# Patient Record
Sex: Female | Born: 1940 | Race: White | Hispanic: No | Marital: Married | Smoking: Former smoker
Health system: Southern US, Community
[De-identification: ages and names within clinical notes are randomized; demographics above are authoritative.]

## PROBLEM LIST (undated history)

## (undated) DIAGNOSIS — M810 Age-related osteoporosis without current pathological fracture: Secondary | ICD-10-CM

## (undated) DIAGNOSIS — R011 Cardiac murmur, unspecified: Secondary | ICD-10-CM

## (undated) DIAGNOSIS — K219 Gastro-esophageal reflux disease without esophagitis: Secondary | ICD-10-CM

## (undated) DIAGNOSIS — K589 Irritable bowel syndrome without diarrhea: Secondary | ICD-10-CM

## (undated) DIAGNOSIS — M419 Scoliosis, unspecified: Secondary | ICD-10-CM

## (undated) DIAGNOSIS — H269 Unspecified cataract: Secondary | ICD-10-CM

## (undated) DIAGNOSIS — M797 Fibromyalgia: Secondary | ICD-10-CM

## (undated) DIAGNOSIS — M1990 Unspecified osteoarthritis, unspecified site: Secondary | ICD-10-CM

## (undated) DIAGNOSIS — J449 Chronic obstructive pulmonary disease, unspecified: Secondary | ICD-10-CM

## (undated) DIAGNOSIS — G4730 Sleep apnea, unspecified: Secondary | ICD-10-CM

## (undated) DIAGNOSIS — Z5189 Encounter for other specified aftercare: Secondary | ICD-10-CM

## (undated) HISTORY — PX: TUBAL LIGATION: SHX77

## (undated) HISTORY — DX: Irritable bowel syndrome, unspecified: K58.9

## (undated) HISTORY — DX: Chronic obstructive pulmonary disease, unspecified: J44.9

## (undated) HISTORY — DX: Sleep apnea, unspecified: G47.30

## (undated) HISTORY — PX: APPENDECTOMY: SHX54

## (undated) HISTORY — DX: Gastro-esophageal reflux disease without esophagitis: K21.9

## (undated) HISTORY — DX: Scoliosis, unspecified: M41.9

## (undated) HISTORY — PX: CORONARY ARTERY BYPASS GRAFT: SHX141

## (undated) HISTORY — PX: ABDOMINAL HYSTERECTOMY: SHX81

## (undated) HISTORY — DX: Unspecified osteoarthritis, unspecified site: M19.90

## (undated) HISTORY — DX: Fibromyalgia: M79.7

## (undated) HISTORY — DX: Unspecified cataract: H26.9

## (undated) HISTORY — DX: Cardiac murmur, unspecified: R01.1

## (undated) HISTORY — DX: Age-related osteoporosis without current pathological fracture: M81.0

## (undated) HISTORY — PX: TONSILLECTOMY: SUR1361

## (undated) HISTORY — DX: Encounter for other specified aftercare: Z51.89

---

## 1997-07-04 ENCOUNTER — Other Ambulatory Visit: Admission: RE | Admit: 1997-07-04 | Discharge: 1997-07-04 | Payer: Self-pay | Admitting: *Deleted

## 1998-09-22 ENCOUNTER — Other Ambulatory Visit: Admission: RE | Admit: 1998-09-22 | Discharge: 1998-09-22 | Payer: Self-pay | Admitting: *Deleted

## 1999-09-22 ENCOUNTER — Encounter: Admission: RE | Admit: 1999-09-22 | Discharge: 1999-09-22 | Payer: Self-pay | Admitting: Internal Medicine

## 1999-09-22 ENCOUNTER — Encounter: Payer: Self-pay | Admitting: Internal Medicine

## 1999-09-29 ENCOUNTER — Other Ambulatory Visit: Admission: RE | Admit: 1999-09-29 | Discharge: 1999-09-29 | Payer: Self-pay | Admitting: *Deleted

## 2000-01-04 ENCOUNTER — Encounter: Payer: Self-pay | Admitting: Cardiovascular Disease

## 2000-01-05 ENCOUNTER — Inpatient Hospital Stay (HOSPITAL_COMMUNITY): Admission: RE | Admit: 2000-01-05 | Discharge: 2000-01-06 | Payer: Self-pay | Admitting: Cardiovascular Disease

## 2000-02-27 ENCOUNTER — Encounter: Payer: Self-pay | Admitting: *Deleted

## 2000-02-27 ENCOUNTER — Inpatient Hospital Stay (HOSPITAL_COMMUNITY): Admission: EM | Admit: 2000-02-27 | Discharge: 2000-03-06 | Payer: Self-pay | Admitting: Emergency Medicine

## 2000-03-02 ENCOUNTER — Encounter: Payer: Self-pay | Admitting: Cardiothoracic Surgery

## 2000-03-02 ENCOUNTER — Encounter: Payer: Self-pay | Admitting: Thoracic Surgery (Cardiothoracic Vascular Surgery)

## 2000-03-03 ENCOUNTER — Encounter: Payer: Self-pay | Admitting: Thoracic Surgery (Cardiothoracic Vascular Surgery)

## 2000-03-04 ENCOUNTER — Encounter: Payer: Self-pay | Admitting: Thoracic Surgery (Cardiothoracic Vascular Surgery)

## 2000-09-23 ENCOUNTER — Encounter: Payer: Self-pay | Admitting: *Deleted

## 2000-09-23 ENCOUNTER — Encounter: Admission: RE | Admit: 2000-09-23 | Discharge: 2000-09-23 | Payer: Self-pay | Admitting: *Deleted

## 2000-09-29 ENCOUNTER — Other Ambulatory Visit: Admission: RE | Admit: 2000-09-29 | Discharge: 2000-09-29 | Payer: Self-pay | Admitting: *Deleted

## 2000-10-21 ENCOUNTER — Encounter: Payer: Self-pay | Admitting: *Deleted

## 2000-10-21 ENCOUNTER — Encounter: Admission: RE | Admit: 2000-10-21 | Discharge: 2000-10-21 | Payer: Self-pay | Admitting: *Deleted

## 2001-07-10 ENCOUNTER — Encounter: Payer: Self-pay | Admitting: Thoracic Surgery (Cardiothoracic Vascular Surgery)

## 2001-07-10 ENCOUNTER — Encounter
Admission: RE | Admit: 2001-07-10 | Discharge: 2001-07-10 | Payer: Self-pay | Admitting: Thoracic Surgery (Cardiothoracic Vascular Surgery)

## 2001-09-25 ENCOUNTER — Encounter: Payer: Self-pay | Admitting: *Deleted

## 2001-09-25 ENCOUNTER — Encounter: Admission: RE | Admit: 2001-09-25 | Discharge: 2001-09-25 | Payer: Self-pay | Admitting: *Deleted

## 2001-10-04 ENCOUNTER — Other Ambulatory Visit: Admission: RE | Admit: 2001-10-04 | Discharge: 2001-10-04 | Payer: Self-pay | Admitting: *Deleted

## 2002-10-09 ENCOUNTER — Other Ambulatory Visit: Admission: RE | Admit: 2002-10-09 | Discharge: 2002-10-09 | Payer: Self-pay | Admitting: *Deleted

## 2002-10-11 ENCOUNTER — Encounter: Payer: Self-pay | Admitting: *Deleted

## 2002-10-11 ENCOUNTER — Encounter: Admission: RE | Admit: 2002-10-11 | Discharge: 2002-10-11 | Payer: Self-pay | Admitting: *Deleted

## 2002-11-27 ENCOUNTER — Encounter: Payer: Self-pay | Admitting: *Deleted

## 2002-11-27 ENCOUNTER — Encounter: Admission: RE | Admit: 2002-11-27 | Discharge: 2002-11-27 | Payer: Self-pay | Admitting: *Deleted

## 2003-08-21 ENCOUNTER — Ambulatory Visit (HOSPITAL_COMMUNITY): Admission: RE | Admit: 2003-08-21 | Discharge: 2003-08-21 | Payer: Self-pay | Admitting: Internal Medicine

## 2003-08-22 ENCOUNTER — Ambulatory Visit (HOSPITAL_COMMUNITY): Admission: RE | Admit: 2003-08-22 | Discharge: 2003-08-22 | Payer: Self-pay | Admitting: Internal Medicine

## 2003-10-29 ENCOUNTER — Encounter: Admission: RE | Admit: 2003-10-29 | Discharge: 2003-10-29 | Payer: Self-pay | Admitting: Internal Medicine

## 2004-12-02 ENCOUNTER — Encounter: Admission: RE | Admit: 2004-12-02 | Discharge: 2004-12-02 | Payer: Self-pay | Admitting: Internal Medicine

## 2004-12-08 ENCOUNTER — Other Ambulatory Visit: Admission: RE | Admit: 2004-12-08 | Discharge: 2004-12-08 | Payer: Self-pay | Admitting: Obstetrics & Gynecology

## 2005-06-07 ENCOUNTER — Ambulatory Visit (HOSPITAL_COMMUNITY): Admission: RE | Admit: 2005-06-07 | Discharge: 2005-06-07 | Payer: Self-pay | Admitting: Internal Medicine

## 2005-12-06 ENCOUNTER — Ambulatory Visit (HOSPITAL_COMMUNITY): Admission: RE | Admit: 2005-12-06 | Discharge: 2005-12-06 | Payer: Self-pay | Admitting: Internal Medicine

## 2006-09-14 ENCOUNTER — Ambulatory Visit (HOSPITAL_COMMUNITY): Admission: RE | Admit: 2006-09-14 | Discharge: 2006-09-14 | Payer: Self-pay | Admitting: Cardiovascular Disease

## 2008-03-01 LAB — HM PAP SMEAR

## 2009-08-27 ENCOUNTER — Inpatient Hospital Stay (HOSPITAL_COMMUNITY): Admission: EM | Admit: 2009-08-27 | Discharge: 2009-08-28 | Payer: Self-pay | Admitting: Internal Medicine

## 2009-08-27 ENCOUNTER — Encounter: Payer: Self-pay | Admitting: Emergency Medicine

## 2009-08-27 ENCOUNTER — Encounter: Payer: Self-pay | Admitting: Pulmonary Disease

## 2009-08-27 ENCOUNTER — Ambulatory Visit: Payer: Self-pay | Admitting: Diagnostic Radiology

## 2009-09-04 ENCOUNTER — Ambulatory Visit (HOSPITAL_COMMUNITY): Admission: RE | Admit: 2009-09-04 | Discharge: 2009-09-04 | Payer: Self-pay | Admitting: Internal Medicine

## 2010-01-28 DIAGNOSIS — I7300 Raynaud's syndrome without gangrene: Secondary | ICD-10-CM | POA: Insufficient documentation

## 2010-01-28 DIAGNOSIS — I10 Essential (primary) hypertension: Secondary | ICD-10-CM | POA: Insufficient documentation

## 2010-01-29 ENCOUNTER — Ambulatory Visit: Payer: Self-pay | Admitting: Pulmonary Disease

## 2010-01-29 ENCOUNTER — Encounter: Payer: Self-pay | Admitting: Pulmonary Disease

## 2010-01-29 HISTORY — PX: CORONARY ANGIOPLASTY: SHX604

## 2010-02-11 DIAGNOSIS — J449 Chronic obstructive pulmonary disease, unspecified: Secondary | ICD-10-CM | POA: Insufficient documentation

## 2010-04-02 NOTE — Assessment & Plan Note (Signed)
Summary: consult for possible copd   Visit Type:  Initial Consult Copy to:  [name] MD Primary Provider/Referring Provider:  [name] MD  CC:  pulmonary consult. pt's here to be evaluated for COPD. Jasmine Reeves  History of Present Illness: The pt is a 70y/o female who I have been asked to see for possible "copd".  She recently was seen in the ER for chest pain, and a cxr revealed changes of "copd".  The pt does have a remote history of smoking, and denies having childhood asthma.  She denies doe with her ADL's, and tells me that she can walk without getting sob.  She has no difficulties walking up stairs.  She denies productive cough.  She does have an abnormal sensation/fullness in her chest that is always there, but feels it is due to her GERD.  She has no FHx of emphysema.    Current Medications (verified): 1)  Losartan Potassium 25 Mg Tabs (Losartan Potassium) .... Once Daily 2)  Diltiazem Hcl Er Beads 180 Mg Xr24h-Cap (Diltiazem Hcl Er Beads) .... Once Daily 3)  Labetalol Hcl 100 Mg Tabs (Labetalol Hcl) .Jasmine Reeves.. 100 Mg in The Am and 50 Mg in The Pm 4)  Lansoprazole 30 Mg Cpdr (Lansoprazole) .... Once Daily 5)  Vytorin 10-20 Mg Tabs (Ezetimibe-Simvastatin) .... Once Daily 6)  Cetirizine Hcl 10 Mg Tabs (Cetirizine Hcl) .... Once Daily 7)  Aspirin 81 Mg Tabs (Aspirin) .... Once Daily 8)  Multivitamins  Caps (Multiple Vitamin) .... Once Daily 9)  Iron Supplement 325 (65 Fe) Mg Tabs (Ferrous Sulfate) .... Once Daily 10)  Vitamin D 2000 Unit Tabs (Cholecalciferol) .... Once Daily 11)  Calcium/magnesium 100-400 .... 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 clotting disorder--father father--MI allergies: brother  Social History: Reviewed history from 01/28/2010 and no changes required. married Patient states former smoker. quit 1970. 2-3 cigs a day. 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  The patient denies shortness of breath at rest, coughing up blood, irregular heartbeats, indigestion, loss of appetite, weight change, abdominal pain, sore throat, tooth/dental problems, headaches, ear ache, depression, rash, change in color of mucus, and fever.    Vital Signs:  Patient profile:   70 year old female Weight:      135.25 pounds O2 Sat:      99 % on Room air Temp:     97.7 degrees F oral Pulse rate:   60 / minute BP sitting:   124 / 78  (left arm) Cuff size:   large  Vitals Entered By: Carver Fila (January 29, 2010 2:37 PM)  O2 Flow:  Room air CC: pulmonary consult. pt's here to be evaluated for COPD.  Comments meds and allergies updated Phone number updated Carver Fila  January 29, 2010 2:37 PM    Physical Exam  General:  wd female in nad Eyes:  PERRLA and EOMI.   Nose:  patent  without discharge Mouth:  clear, no exudates or lesions seen Neck:  no jvd ,tmg, LN Lungs:  totally clear to auscultation Heart:  rrr, no mrg Abdomen:  soft and nontender, bs+ Extremities:  no edema or cyanosis, pulses intact distally Neurologic:  alert and oriented, moves all 4.   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  She has a very remote history of smoking, and has an excellent exercise tolerance with no symptoms suggestive of airways disease.  I have told the patient not to worrry about this minimal level of abnormality.  There is really no indication for bronchodilators.  She is to followup with me if she becomes more symptomatic.    Medications Added to Medication List This Visit: 1)  Losartan Potassium 25 Mg Tabs (Losartan potassium) .... Once daily 2)  Diltiazem Hcl Er Beads 180 Mg Xr24h-cap (Diltiazem hcl er beads) .... Once daily 3)  Labetalol Hcl 100 Mg Tabs (Labetalol hcl) .Jasmine Reeves.. 100 mg in the am and 50 mg in the pm 4)  Lansoprazole 30 Mg Cpdr (Lansoprazole) .... Once daily 5)  Vytorin 10-20 Mg Tabs (Ezetimibe-simvastatin) .... Once daily 6)  Cetirizine Hcl 10 Mg Tabs (Cetirizine hcl) .... Once daily 7)  Aspirin 81 Mg Tabs (Aspirin) .... Once daily 8)  Multivitamins Caps (Multiple vitamin) .... Once daily 9)  Iron Supplement 325 (65 Fe) Mg Tabs (Ferrous sulfate) .... Once daily 10)  Vitamin D 2000 Unit Tabs (Cholecalciferol) .... Once daily 11)  Calcium/magnesium 100-400  .... Once daily  Other Orders: Consultation Level IV (96295) Spirometry w/Graph (94010)  Patient Instructions: 1)  stay as active as possible 2)  no indication for intervention at this time. 3)  followup with me if you become more symptomatic.

## 2010-05-17 LAB — BASIC METABOLIC PANEL
BUN: 18 mg/dL (ref 6–23)
CO2: 31 mEq/L (ref 19–32)
Calcium: 9.2 mg/dL (ref 8.4–10.5)
Chloride: 104 mEq/L (ref 96–112)
Creatinine, Ser: 0.7 mg/dL (ref 0.4–1.2)
GFR calc Af Amer: >60 mL/min (ref >60)
GFR calc non Af Amer: >60 mL/min (ref >60)
Glucose, Bld: 113 mg/dL — ABNORMAL HIGH (ref 70–99)
Potassium: 3.8 mEq/L (ref 3.5–5.1)
Sodium: 142 mEq/L (ref 135–145)

## 2010-05-17 LAB — DIFFERENTIAL
Basophils Absolute: 0.1 10*3/uL (ref 0.0–0.1)
Basophils Relative: 3 % — ABNORMAL HIGH (ref 0–1)
Eosinophils Absolute: 0.2 10*3/uL (ref 0.0–0.7)
Eosinophils Relative: 4 % (ref 0–5)
Lymphocytes Relative: 24 % (ref 12–46)
Lymphs Abs: 1.2 10*3/uL (ref 0.7–4.0)
Monocytes Absolute: 0.4 10*3/uL (ref 0.1–1.0)
Monocytes Relative: 9 % (ref 3–12)
Neutro Abs: 2.9 10*3/uL (ref 1.7–7.7)
Neutrophils Relative %: 60 % (ref 43–77)

## 2010-05-17 LAB — CBC
HCT: 36.2 % (ref 36.0–46.0)
Hemoglobin: 12.6 g/dL (ref 12.0–15.0)
MCH: 31.4 pg (ref 26.0–34.0)
MCHC: 34.7 g/dL (ref 30.0–36.0)
MCV: 90.4 fL (ref 78.0–100.0)
Platelets: 188 10*3/uL (ref 150–400)
RBC: 4.01 MIL/uL (ref 3.87–5.11)
RDW: 12.3 % (ref 11.5–15.5)
WBC: 4.8 10*3/uL (ref 4.0–10.5)

## 2010-05-17 LAB — CK TOTAL AND CKMB (NOT AT ARMC)
CK, MB: 1.8 ng/mL (ref 0.3–4.0)
CK, MB: 2.2 ng/mL (ref 0.3–4.0)
Relative Index: INVALID (ref 0.0–2.5)
Relative Index: INVALID (ref 0.0–2.5)
Total CK: 90 U/L (ref 7–177)
Total CK: 98 U/L (ref 7–177)

## 2010-05-17 LAB — LIPASE, BLOOD: Lipase: 132 U/L (ref 23–300)

## 2010-05-17 LAB — LIPID PANEL
Cholesterol: 127 mg/dL (ref 0–200)
HDL: 57 mg/dL (ref >39)
LDL Cholesterol: 60 mg/dL (ref 0–99)
Total CHOL/HDL Ratio: 2.2 RATIO
Triglycerides: 52 mg/dL (ref <150)
VLDL: 10 mg/dL (ref 0–40)

## 2010-05-17 LAB — TROPONIN I
Troponin I: 0.01 ng/mL (ref 0.00–0.06)
Troponin I: 0.02 ng/mL (ref 0.00–0.06)

## 2010-05-17 LAB — TSH: TSH: 2.622 u[IU]/mL (ref 0.350–4.500)

## 2010-05-17 LAB — HEPATIC FUNCTION PANEL
ALT: 29 U/L (ref 0–35)
AST: 35 U/L (ref 0–37)
Albumin: 4.1 g/dL (ref 3.5–5.2)
Alkaline Phosphatase: 59 U/L (ref 39–117)
Bilirubin, Direct: 0 mg/dL (ref 0.0–0.3)
Indirect Bilirubin: 0.7 mg/dL (ref 0.3–0.9)
Total Bilirubin: 0.7 mg/dL (ref 0.3–1.2)
Total Protein: 6.9 g/dL (ref 6.0–8.3)

## 2010-05-17 LAB — POCT CARDIAC MARKERS
CKMB, poc: <1 ng/mL — ABNORMAL LOW (ref 1.0–8.0)
Myoglobin, poc: 56.7 ng/mL (ref 12–200)
Troponin i, poc: <0.05 ng/mL (ref 0.00–0.09)

## 2010-05-17 LAB — MRSA PCR SCREENING: MRSA by PCR: NEGATIVE

## 2010-07-14 NOTE — Cardiovascular Report (Signed)
Jasmine Reeves, Jasmine Reeves                ACCOUNT NO.:  [id number]   MEDICAL RECORD NO.:  [id number]          PATIENT TYPE:  OIB   LOCATION:  2857                         FACILITY:  [facility]   PHYSICIAN:  Nicki Guadalajara, M.D.     DATE OF BIRTH:  [birth year]   DATE OF PROCEDURE:  DATE OF DISCHARGE:                            CARDIAC CATHETERIZATION   INDICATIONS:  Ms. Militza Reeves is very pleasant 70 year old female who  has known coronary artery disease and underwent CABG surgery in January  2002 times 4 by Dr. Cornelius Reeves for severe two-vessel CAD involving LAD and  circumflex vessel.  She underwent LIMA to the distal LAD, saphenous vein  graft to the second diagonal branch, and sequential graft to two  marginal vessels arising from the circumflex coronary artery.  She does  have a history of mild mitral valve prolapse and history of Raynaud's  disease.  Subsequently, she has done remarkably well.   Yesterday, she experienced chest pressure that was significant.  She  apparently had contacted our office and was evaluated by Jasmine Reeves.  In light of the patient's worrisome symptoms, definitive catheterization  was recommended.   PROCEDURE:  After premedication with Versed 2 mg intravenous, the  patient was prepped and draped in the  usual fashion.  The right femoral  artery was punctured anteriorly and a 5-French sheath was inserted.  Diagnostic catheterization was done utilizing 5-French Judkins #4 left  and right coronary catheters.  The right catheter was used for selective  angiography into the saphenous vein grafts.  A 5-French LIMA catheter  was used for selective angiography in the left internal mammary artery.  Pigtail catheter was used for biplane __________.  Distal aortography  was also performed.  Hemostasis obtained by direct manual pressure.   Hemodynamic data:  Central aortic pressure was 136/62.  Left ventricular  pressure is 138/14.   Angiographic data:  Left main coronary was  angiographically normal,  bifurcated into an LAD and left circumflex system.   There was diffuse LAD disease of at least 70% after a large septal  perforating artery.   The circumflex vessel ostium was improved from the initial cath with  minimal narrowing of 10-20%.  There was diffuse 80% stenoses in the AV  groove circumflex.   The right coronary artery was unbypassed and large-caliber dominant  vessel and was angiographically normal.   The vein graft supplying the diagonal vessel was widely patent.  The  diagonal vessel itself was small caliber.  There was also filling back  into the LAD from the SVG injection.   The sequential graft supplying two marginal vessels was widely patent.   The LIMA graft was widely patent and anastomosed to the mid LAD.  There  was no distal disease.   Biplane __________revealed preserved global contractility without  definitive wall motion abnormality.  There was mild angiographic mitral  valve prolapse.   Distal aortography revealed mild tortuosity of the infrarenal aorta  without aneurysmal dilatation and with smooth contour.  There is no  evidence for renal artery stenosis.   IMPRESSION:  1. Normal LV  function.  2. Native two-vessel coronary artery disease involving the LAD with      diffuse 70% stenoses after large septal perforating artery, diffuse      80% AV groove circumflex stenoses.  3.  Patent LIMA to the mid      distal LAD.  3. Patent vein graft to the second diagonal vessel of the LAD.  4. Patent sequential vein graft to two obtuse marginal branches of the      circumflex coronary artery.  5. Normal dominant native right coronary artery.   RECOMMENDATIONS:  Medical therapy.  Most likely the patient's chest pain  was noncardiac in etiology.  No further non cardiac causes will be  evaluated.           ______________________________  Nicki Guadalajara, M.D.     TK/MEDQ  D:  09/14/2006  T:  09/15/2006  Job:  [id number]   cc:    Jasmine Massed, MD  Jasmine Reeves, M.D.

## 2010-07-17 NOTE — Discharge Summary (Signed)
[place]. [facility]  Patient:    JAELYNN, POZO                         MRN: [id number] Adm. Date:  [id number] Disc. Date: 03/06/00 Attending:  Tressie Stalker Dictator:   Loura Pardon, P.A. CC:         Lennette Bihari, M.D.  Marinus Maw, M.D.   Discharge Summary  DATE OF BIRTH:  03/25/1940  CARDIOLOGIST:  Nicki Guadalajara, M.D.  PRIMARY CAREGIVER:  [name], M.D.  FINAL DIAGNOSES:  1. Unstable angina.  2. Progressive two-vessel atherosclerotic coronary obstructive disease with     restenosis of tandem left anterior descending sites angioplastied,     January 04, 2000.  3. Acute blood loss anemia requiring transfusion of three units of packed red     blood cells the day of surgery, two units of fresh frozen plasma the day     of surgery, one-eighth pack of platelets the day of surgery.  SECONDARY DIAGNOSES:  1. Known history of coronary artery disease status post angioplasty of tandem     lesions in the left anterior descending, January 04, 2000.  2. Hypercholesterolemia.  3. Hypertension.  4. Family history of coronary artery disease.  5. History of Raynauds phenomenon improved on Norvasc.  6. History of reflux disease.  7. 20% left renal artery stenosis noted on left heart catheterization,     November 2001.  8. 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  PROCEDURES:  1. Left heart catheterization, February 29, 2000, Dr. Nicki Guadalajara.  The     study demonstrated tandem 90 and 95% stenoses in the left anterior     descending coronary artery extending to the large first septal perforator.     The first diagonal had an ostial 70 to 80% lesion.  The left circumflex     had a 70% mid atrioventricular groove lesion with a 40% ostial lesion.     The circumflex marginal had a 40% ostial lesion.  Mitral valve prolapse     was noted and normal left ventricular function noted.  2. Carotid duplex ultrasound, February 29, 2000.   The study showed no     hemodynamically significant internal carotid artery stenoses.  There were     palpable pulses bilaterally.  3. Coronary artery bypass graft surgery x 4, March 02, 2000, Dr. Tressie Stalker.  In this surgical procedure, the left internal mammary artery was     connected in an end-to-side fashion to the left anterior descending     coronary artery.  A reverse sequential saphenous vein graft was fashioned     from the aorta to the first obtuse marginal and to the second obtuse     marginal and a reverse saphenous vein graft was fashioned from the aorta     to the diagonal.  Patient tolerated the procedure well, was transfused as     mentioned above intraoperatively.  DISCHARGE DISPOSITION:  Ms. Klara Stjames is ready for discharge on March 06, 2000.  Her postoperative course has taken four days.  She has not experienced any respiratory compromise in the postoperative period; and, in fact, after extubation on postoperative day #1, she was achieving 99 to 100% oxygen saturation on room air.  Her postoperative cardiac index was 2.5. Postoperative hemoglobin after transfusion was 9, hematocrit 25%.  She experienced no prolonged cardiac arrhythmias except for an  episode of nonsustained ventricular tachycardia on postoperative day #1.  She was successfully restarted on her preoperative dose of beta blocker and preoperative dose of ACE I inhibitor.  Her incisions are healing nicely.  She has no evidence of erythema, drainage, or swelling.  Her pain is controlled with oral analgesia.  She is taking Darvocet for pain.  She is ambulating independently.  Her mental status has been clear in the postoperative period. She is taking oral nourishment well and has full GI tract function.  DISCHARGE MEDICATIONS:  1. Darvocet-N 100 one to two tablets p.o. q.3-4h. p.r.n. pain.  2. Protonix 40 mg daily.  3. Estrace 2 mg daily.  4. Enteric aspirin 325 mg daily.  5. Toprol XL 50 mg  daily.  6. Altace 2.5 mg daily.  7. Zocor 20 mg daily.  8. Niferex 150 mg daily.  9. Xanax as prescribed for anxiety. 10. Zyrtec, take as prescribed for nasal congestion.  DISCHARGE ACTIVITY:  Ambulation as tolerated.  She is asked not to lift more than 10 pounds nor to drive for the next six weeks.  DISCHARGE DIET:  Low-sodium, low-cholesterol diet.  WOUND CARE:  She may shower daily, keeping her incisions clean and dry.  FOLLOW-UP:  She will see Dr. Tresa Endo in two weeks.  She is to call [phone] to arrange an appointment.  A chest x-ray will be taken.  She will also see Dr. Tressie Stalker three weeks after her discharge bringing her chest x-ray from Dr. Ellin Goodie office.  Dr. Barry Dienes office will call to make that arrangement.  In regards to medications, if she is bothered by continuation of her Raynauds phenomenon, it is very possible that she will be placed back on her Norvasc which she was taking prior to surgery at a dose of 5 mg daily.  HISTORY OF PRESENT ILLNESS:  Julane Crock is a 70 year old female.  She was admitted to [facility] on February 27, 2000, after experiencing increasing frequency and severity of chest tightness and discomfort beginning with February 22, 2000.  At first, she noted pain on exertion only.  However, episodes have become more frequent and severe requiring less and less exertion to initiate them.  On the day of her admission, December 29, she did take nitroglycerin with relief of pain.  She has a known history of atherosclerotic coronary artery disease and she had a positive stress test in the fall of this year and ultimately underwent left heart catheterization, November 2001, demonstrating high-grade proximal left anterior descending coronary artery tandem lesions.  These were treated with percutaneous transluminal coronary angioplasty with satisfactory results.  She is now admitted to [facility] for further workup  and  possible repeat left heart catheterization.  HOSPITAL COURSE:  After admission to [facility] on December 29 with accelerating angina, cardiac enzymes on admission were negative.  She was place don IV heparin and IV nitroglycerin with relief of pain.  She underwent left heart catheterization on December 31 which showed severe two-vessel atherosclerotic coronary artery disease.  She also underwent a carotid duplex ultrasound the same day which was negative.  She was seen in consultation for the [facility] by Dr. Sheliah Plane on New Years Day.  He recommended revascularization surgery to be done by Dr. Tressie Stalker who saw the patient in consultation, as well, and agreed with Dr. Ysidro Evert assessment.  She underwent coronary artery bypass graft surgery x 4, March 02, 2000, with transfusion for  acute blood loss anemia the day of surgery.  She was extubated the day of surgery and her postoperative course is as described in the discharge disposition. She has done well overall and is judged suitable to go home on the medications and with follow-up as described above.  Condition has been greatly improved. DD:  03/06/00 TD:  03/06/00 Job: [id number]

## 2010-07-17 NOTE — Discharge Summary (Signed)
[place]. [facility]  Patient:    Jasmine Reeves, Jasmine Reeves                         MRN: [id number] Adm. Date:  [id number] Disc. Date: 03/06/00 Attending:  Tressie Stalker Dictator:   Loura Pardon, P.A.                           Discharge Summary  ADDENDUM   DISCHARGE MEDICATIONS:  1. Darvocet-N 100 one to two tablets p.o. q.3-4h. p.r.n. pain.  2. Protonix 40 mg daily.  3. Estrace 2 mg daily.  4. Enteric coated aspirin 325 mg daily.  5. Toprol XL 50 mg daily.  6. Altace 2.5 mg daily.  7. Zocor 20 mg at bedtime daily.  8. Niferex 150 mg daily.  9. Xanax as needed per prescription. 10. Zyrtec as prescribed. DD:  03/06/00 TD:  03/06/00 Job: [id number]

## 2010-07-17 NOTE — Cardiovascular Report (Signed)
[place]. [facility]  Patient:    Jasmine Reeves, Jasmine Reeves                         MRN: [id number] Proc. Date: 02/29/00 Adm. Date:  [id number] Attending:  Virgina Evener CC:         Marinus Maw, M.D.  [facility]   Cardiac Catheterization  INDICATIONS:  Mrs. Jasmine Reeves is a very pleasant 70 year old white female, who on November 2 had a strongly positive exercise Cardiolite study. Catheterization revealed 2-vessel coronary artery disease involving the LAD and circumflex coronary artery with 90% near ostial and very proximal tandem LAD stenosis before a moderate to large first septal perforating artery, as well as 20-30% irregularity of the proximal circumflex with 40% ostial narrowing in the first marginal vessel, 30% AV groove circumflex followed by 60% mid A-V groove circumflex stenosis.  At that time, she underwent successful cutting balloon angioplasty of the proximal to near ostial LAD stenoses with the tandem stenoses being reduced to 0% without evidence for dissection and with evidence for TIMI-3 flow.  Stenting was not done due to the large septal perforating artery and excellent angiographic result. Patient initially felt well on medical therapy.  Recently, she again has developed recurrent chest tightness.  On Friday evening, she developed a severe episode of chest tightness that was nitrate-responsive.  She was admitted to [facility] with a presumptive diagnosis of unstable angina.  Repeat catheterization is performed today to assess early restenosis two months after her initial intervention.  HEMODYNAMIC DATA:  Central aortic pressure 140/70, left ventricular pressure 140/16.  ANGIOGRAPHIC DATA:  Left main coronary artery was angiographically normal, but short and bifurcated into a LAD and left circumflex coronary artery.  There was evidence for restenosis of the proximal LAD just beyond  the ostium with tandem 90 and 95% stenoses extending to the takeoff of this large first septal perforating artery.  The diagonal vessel was small to medium size and arose in the proximal to mid LAD and had 70-80% ostial narrowing.  The mid LAD dipped intramyocardially and there was some mild systolic thickening not significant during systole.  The LAD wrapped around extended to the LV apex.  In one view of the ostium of the circumflex, there was narrowing of 40%.  The circumflex gave rise to a marginal vessels that had 40% ostial narrowing and the A-V groove circumflex just beyond this was 40% narrowed.   In the mid A-V groove circumflex there was narrowing of 70%.  The right coronary artery was a very large dominant and angiographically normal vessel.  Biplane cineangiography revealed normal global LV function with evidence for mild angiographic mitral valve prolapse.  There was very focal mid diaphragmatic hypocontractility.  At this point, I broke scrub, I reviewed the angiographic findings with the patient in detail.  I also discussed the options with the patients family in detail.  In light of fairly early restenosis at approximately 2 months with progressive disease involving her circumflex and diagonal vessels, my recommendation was to consider CABG revascularization surgery for long-term optimal therapy.  The patient was taken off the catheterization table to discuss this further with her family members prior to final decision.  IMPRESSION: 1. Normal LV function with focal mid posterior hypocontractility. 2. Angiographic mitral valve prolapse. 3. Progressive 2-vessel coronary obstructive disease with tandem restenosis of    the very proximal LAD of 90 and 95%  with progressive 70-80% first diagonal    stenosis; and 40% near ostial circumflex stenosis with 40% ostial    circumflex marginal and 40 and 70% mid A-V groove circumflex stenoses.  RECOMMENDATION:  Probable CABG  revascularization surgery.  Dr. Tyrone Sage was notified and will see the patient later today.  In the interim, the patient will be started on Aggrastat, in addition to heparin, as well as intravenous nitroglycerin prior to her planned surgery. DD:  02/29/00 TD:  02/29/00 Job: [id number]

## 2010-07-17 NOTE — H&P (Signed)
[place]. [facility]  Patient:    Jasmine Reeves, Jasmine Reeves                         MRN: [id number] Adm. Date:  [id number] Attending:  Virgina Evener CC:         Marinus Maw, M.D./Leona Windy Fast, M.D.  Cardiac Catheterization Laboratory   History and Physical  CHIEF COMPLAINT: Ms. Kendyl Festa is a very pleasant 70 year old white female who recently has developed exertionally precipitated chest discomfort.  HISTORY OF PRESENT ILLNESS: An exercise Cardiolite study done on January 01, 2000 was positive with chest pain and up to 2-3 mm of ST segment depression, with exercise induced ventricular and atrial ectopy.  Scintigraphic images demonstrated anterior and anteroseptal ischemia suggesting high-grade stenosis in the LAD.  She is now brought in for cardiac catheterization.  HEMODYNAMIC DATA: Central aortic pressure is 132/66, left ventricular pressure 132/12.  ANGIOGRAPHIC DATA: Left main coronary artery was a very short vessel that immediately bifurcated into an LAD and left circumflex system.  There was tandem 90% near ostial and proximal LAD stenosis which also encroached upon a large first septal perforating branch.  The remainder of the LAD had mild irregularity but was free of significant disease and extended and wrapped around the LV apex.  The circumflex vessel had mild 20% irregularity.  There was 40% ostial stenosis in a marginal vessel and 30% narrowing in the AV groove circumflex just after this marginal takeoff.  The mid AV groove circumflex had 60% narrowing.  The right coronary artery was a large caliber dominant vessel that gave rise to the PDA and then a small posterolateral system.  The left subclavian and internal mammary artery were angiographically normal and suitable if the patient required CABG revascularization surgery.  Biplane scintillography suggested mild angiographic mitral valve prolapse. There was  normal LV function without segmental wall motion abnormalities.  Distal aortography revealed normal aortoiliac system.  There was a mild 20% narrowing in the left renal artery.  At this point I broke scrub.  Cineangiogram was reviewed with the patient and I also went to the waiting room and brought the pictures to the family members.  I discussed the risks and benefits of coronary intervention.  The decision was made t4o attempt intervention in his very proximal near ostial LAD system.  The arterial sheath was exchanged for a 7 [place] system.  Double bolus Integrelin and 4000 units of weight adjusted heparinization was administered. ACT was documented to be therapeutic.  Because there was a very large septal perforating artery with the LAD stenosis impinging just at the beginning of this septal it was felt that rather than primary stenting, which may gel this large septal and potentially have to be positioned right at the left main, cutting balloon angioplasty was initially attempted in an attempt to adequately disperse and debulk the lesion to reduce the likelihood of plaque shifting into the side branch and ultimately plan to do stenting thereafter. A pacer wire was advanced down the LAD.  An FL35 guide with side holes was necessary for the intervention.  A cutting balloon 3.5 x 15 mm was inserted and three serial cuts up to 5 atmospheres made in this native LAD.  At this point scout angiography demonstrated an excellent angiographic result with residual narrowing of 0%.  There was no evidence for dissection.  There was no evidence for any reduction of TIMI flow, and there  was no evidence for any plaque shifting with narrowing in the perforating artery.  For this reason after some discussion with the patient it was felt that since the angiographic appearance was excellent and that it was not worth the risk for stenting with potential mechanical impingement of the side vessel and due to  its juxtaposition to the left main coronary artery.  Scout angiography confirmed TIMI-3 flow.  IMPRESSION:  1. Normal left ventricular function with mild angiographic mitral valve     prolapse.  2. Two vessel coronary artery disease involving the left anterior descending     and left circumflex coronary artery, with 90% near ostial and very     proximal tandem left anterior descending stenoses before the first septal     perforating artery; 20% irregularity in the proximal circumflex with 40%     ostial first obtuse marginal stenosis; 30% AV groove circumflex stenosis     followed by 60% mid AV groove stenosis; 20% mild narrowing in the left     renal artery; successful cutting balloon angioplasty of the proximal near     ostial left anterior descending with 90% tandem stenosis being reduced to     0%; no evidence for dissection; TIMI-3 flow; double bolus     Integrilin/weight adjusted heparinization. DD:  01/04/00 TD:  01/05/00 Job: [id number]

## 2010-07-17 NOTE — Consult Note (Signed)
[place]. [facility]  Patient:    Jasmine Reeves, Jasmine Reeves                         MRN: [id number] Proc. Date: 01/05/00 Adm. Date:  [id number] Attending:  Virgina Evener Dictator:   Chinita Pester, C.R.N.P. CC:         Marinus Maw, M.D.  John R. Aleen Campi, M.D.   Consultation Report  REASON FOR CONSULTATION:  Nonsustained ventricular tachycardia.  HISTORY OF PRESENT ILLNESS:  Thank you for this consult on a 70 year old female, who was admitted for cardiac catheterization after a positive stress test showing ischemia in the LAD territory.  She underwent a PTCA stent in the LAD.  At 2 oclock in the morning on November 6, she developed a 35 beat run of monomorphic nonsustained ventricular tachycardia.  Denies dizziness, chest pain, or lightheadedness.  She states she felt her heart race.  She has not had any episodes like this in the past but does describe the feeling of premature beats and occasionally short bouts of an increased heart rate.  She remains totally asymptomatic.  PAST MEDICAL HISTORY:  Positive for CAD, degenerative joint disease, irritable bowel syndrome, and hiatal hernia.  PAST SURGICAL HISTORY:  Status post PTCA stent to the LAD.  SOCIAL HISTORY:  She is married, Engineer, site.  Denies tobacco.  Positive wine nightly with dinner.  No drugs.  ALLERGIES:  No known drug allergies.  MEDICATIONS: 1. Aspirin 325 daily. 2. Plavix 75 daily. 3. Altace 2.5 daily. 4. Zocor 20 daily. 5. Norvasc 5 daily. 6. Toprol 50 daily. 7. Estrace 2 daily. 8. Protonix 40 daily. 9. Claritin p.r.n.  REVIEW OF SYSTEMS:  Unremarkable with the exception of palpitations.  LABORATORY DATA:  White count 7.5, hemoglobin and hematocrit 12.7 and 35.3, platelets 194.  Sodium 137, potassium 3.8, chloride 105, CO2 27, glucose 133, BUN 9, creatinine 0.6, magnesium was 2.2.  Cardiac enzymes were negative. Also of note, on patients cardiac catheterization she showed  mild mitral valve prolapse with a normal LV function.  PHYSICAL EXAMINATION:  GENERAL:  The patient is on telemetry monitoring, normal sinus rhythm with occasional PACs.  No other ectopy was noted.  She is a well-developed 70 year old female, lying in bed in no apparent distress.  HEENT:  Pupils equal, round, and reactive to light.  EOMs intact.  Sclerae clear.  NECK:  Supple.  Nontender.  No bruits.  No JVD.  CARDIOVASCULAR:  Rate regular rhythm.  Positive S1, S2.  No S3.  Positive soft diastolic murmur at the apex.  CHEST/LUNGS:  Clear to auscultation bilateral.  ABDOMEN:  Soft, round, nontender.  Normoactive bowel sounds.  EXTREMITIES:  Show no edema.  NEUROLOGICAL:  Awake, alert, and oriented x 3.  ASSESSMENT: 1. Nonsustained monomorphic ventricular tachycardia. 2. Coronary artery disease, status post percutaneous transluminal coronary    angioplasty stent and left anterior descending.  PLAN:  Agree with increased beta blockers.  Check cardiac enzymes this afternoon and again in a.m. as well as an ECG in the morning.  Continue telemetry monitoring every night.  No indication for an EP study at this time. D:  01/05/00 TD:  01/06/00 Job: [id number]

## 2010-07-17 NOTE — Op Note (Signed)
[place]. [facility]  Patient:    Jasmine Reeves                         MRN: [id number] Proc. Date: 03/02/00 Adm. Date:  [id number] Attending:  Tressie Stalker CC:         Lennette Bihari, M.D.  Marinus Maw, M.D.  CVTS Office   Operative Report  PREOPERATIVE DIAGNOSIS:  Severe 2 vessel coronary artery disease with Class IV unstable angina.  POSTOPERATIVE DIAGNOSIS:  Severe 2 vessel coronary artery disease with Class IV unstable angina.  PROCEDURE:  Median sternotomy for coronary artery bypass grafting x 4 (left internal mammary artery to distal left anterior descending coronary artery, saphenous vein graft to second diagonal branch, saphenous vein graft to first circumflex marginal branch and sequential saphenous vein graft to second circumflex marginal branch).  SURGEON:  Salvatore Decent. Cornelius Moras, M.D.  ASSISTANT:  Lissa Merlin, P.A.  ANESTHESIA:  General.  BRIEF CLINICAL NOTE:  The patient is a 70 year old white female followed by Dr. Marlowe Shores and referred by Dr. Daphene Jaeger for management of coronary artery disease.  The patient initially underwent angioplasty of the proximal left anterior descending coronary artery in early November of 2001, for high grade proximal left anterior descending coronary stenosis.  The patient now returns with unstable symptoms and catheterization demonstrates re-stenosis of the proximal left anterior descending coronary artery with associated severe disease involving the left circumflex system.  The left ventricular function is preserved.  The patient and her family are counseled at length regarding the indications and potential benefits of coronary artery bypass grafting.  They understand the associated risks of surgery including but not limited to risks of death, stroke, myocardial infarction, bleeding, requiring blood transfusion, arrhythmia, infection, and recurrent coronary artery disease.  They accept these  risks as well as unforeseen complications and desire to proceed to with surgery as described.  DESCRIPTION OF PROCEDURE:  The patient is brought to the operating room on the above mentioned date and invasive hemodynamic monitoring is established by the anesthesia service under the care and direction of Dr. Darlyn Read.  The patient is placed in the supine position on the operating table.  Following induction with general endotracheal anesthesia, intravenous antibiotics are administered.  The patients chest, abdomen, both groins, and both lower extremities are prepared and draped in a sterile manner.  A median sternotomy incision is performed, and the left internal mammary artery is dissected from the chest wall and prepared for bypass grafting.  The left internal mammary artery is somewhat small caliber and very delicate but otherwise felt to be good quality and has good forward flow. Simultaneously the saphenous vein is obtained from the patients right lower leg through a longitudinal incisions.  The saphenous vein is felt to be good quality conduit for bypass grafting.  The patient was heparinized systemically.  The pericardium is opened.  The the ascending aorta is inspected and is notably free of palpable plaques or calcifications.  The ascending aorta and the right atrial appendage are cannulated for cardiopulmonary bypass. ADequate heparinization is verified.  Cardiopulmonary bypass is begun and the surface of the heart is inspected. Distal sites are selected for coronary artery bypass grafting.  Of note, the distal left anterior descending artery is intramyocardial.  Portions of saphenous vein in the left internal mammary artery are trimmed to appropriate lengths.  A temperature probe is placed in the left ventricular septum and a styrofoam  pad is placed to protect the left phrenic nerve from thermal injury. A cardioplegia catheter is placed in the ascending aorta.  The  patient is allowed to drift cool to 32 degrees systemic temperature. Active cooling is avoided due to the patients history of Raynauds phenomenon.  The aortic crossclamp is applied and cardioplegia is delivered in an antegrade fashion through the aortic root.  Iced saline slush is applied for topical hypothermia.  The initial cardioplegic arrest and subsequently myocardial cooling are felt to be excellent.  Repeat doses of cardioplegia are administered intermittently throughout the crossclamp portion of the opertion both through the aortic root and down the subsequently placed vein grafts to maintain septal temperature below 15 degrees centigrade.  The following distal coronary anastomosis are performed: 1. The first circumflex marginal branch is grafted with a saphenous vein graft    in a side-to-side using running 7-0 Prolene suture.  This coronary measured    1.5 mm in diameter and is of fair to good quality. 2. The second circumflex marginal branch is grafted using the sequential    saphenous vein graft off of the vein placed to the first circumflex    marginal branch.  This coronary measures 1.5 mm in diameter and is of good    quality. 3. The second diagonal branch off the left anterior descending coronary artery    is grafted with a saphenous vein graft in an end-to-side fashion using    running 7-0 Prolene suture.  This coronary measures 1.0 mm in diameter and    is of fair quality. 4. The distal left anterior descending coronary artery is grafted with a left    internal mammary artery using running 8-0 Prolene suture.  This coronary    measures 1.4 mm in diameter and is intramyocardial.  It is of good quality    at the site of distal bypass.  Both proximal saphenous vein anastomosis are    performed directly to the ascending aorta prior to removal of the aortic    crossclamp.  The septal temperature is noted to rise rapidly and     dramatically upon reperfusion of the left  internal mammary artery.  All air    is evacuated from the aortic root.  The aortic crossclamp is removed after    a total crossclamp time of 56 minutes.  The patient was rewarmed to greater than 36.5 degrees Centigrade.  Epicardial pacing wires were affixed to the right ventricular outflow tract and to the right atrial appendage.  The patient was weaned from cardiopulmonary bypass without difficulty.  The patients rhythm at separation from bypass is normal sinus rhythm requiring atrial pacing for rate only.  The patient is on no inotropic support at separation from bypass.  The total cardiopulmonary bypass time for the operation is 62 minutes.  The heart begins to beat spontaneously without need for cardioversion.  All proximal and distal anastomosis are inspected for hemostasis and appropriate graft orientation.  Epicardial pacing wires are affixed to the right ventricular outflow track into the right atrial appendage.  The patient is weaned from cardiopulmonary bypass without difficulty.  The patients rhythm at separation from bypass is normal sinus rhythm.  No inotropic support is required.  Total cardiopulmonary bypass time for the operation is 82 minutes.  The venous and arterial cannulae are removed uneventfully.  Protamine is administered to reverse the anticoagulation.  The mediastinum and the left pleural space is irrigated with saline solution containing vancomycin. Meticulous surgical hemostasis is  ascertained.  There remains moderate coagulopathy.  The patient is transfused one eight pack of adult platelets due to persistent diffuse oozing, after reversal of heparin with protamine. The patient was transfused 2 units of packed red blood cells during cardiopulmonary bypass due to anemia with a hematocrit of 16% after onset of cardiopulmonary bypass.  The mediastinum and both left and right pleural spaces are drained with 4 chest tubes, placed through separate stab  incisions inferiorly.  The median sternotomy is closed in a routine fashion.  The right lower extremity incisions is closed in a routine fashion.  All skin incisions are closed with subcuticular skin staples.  The patient tolerated the procedure well and was transported to the surgical intensive care unit in stable condition.  There were no intraoperative complications.  All sponge, instrument and needle counts are verified correct at the completion of the operation. DD:  03/02/00 TD:  03/02/00 Job: [id number]

## 2010-07-17 NOTE — Discharge Summary (Signed)
[place]. [facility]  Patient:    Jasmine Reeves, Jasmine Reeves                         MRN: [id number] Adm. Date:  [id number] Disc. Date: [id number] Attending:  Virgina Evener Dictator:   Mancel Bale, P.A. CC:         Marinus Maw, M.D.   Discharge Summary  ADMITTING DIAGNOSES: 1. Unstable angina. 2. History of gastroesophageal reflux disease. 3. Postmenopausal. 4. Seasonal allergy.  DISCHARGE DIAGNOSES: 1. Unstable angina. 2. History of gastroesophageal reflux disease. 3. Postmenopausal. 4. Seasonal allergy. 5. Status post cardiac catheterization January 04, 2000, by Dr. Nicki Guadalajara    with cutting balloon performed to the ostial left anterior descending x 2    lesions. 6. Peripheral vascular disease with 20% left renal artery stenosis on cardiac    catheterization. 7. Status post 35 beats of nonsustained ventricular tachycardia January 05, 2000. 8. Status post electrophysiology consultation on January 05, 2000, by Dr. Sharrell Ku.  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  Her cardiac risk factor and profile is basically benign.  She had a Cardiolite stress test performed one year ago that was entirely normal.  She had noted some abdominal burning radiating to her chest, occurring several times a week, making her feel weak afterwards. That occurred with exertion and also with mild dyspnea.  She had a Cardiolite stress test performed in our office on January 01, 2000, that revealed ischemia in the LAD territory.  She was then planned for cardiac catheterization.  At that time, she was also moderately hypertensive with a blood pressure of 162/98, and therefore was started on Norvasc and Toprol.  As well, she was started on aspirin and Plavix given her Cardiolite findings. She saw Dr. Allyson Sabal in the  office on that date, at which time he discussed cardiac catheterization with the patient and her family, and they agreed with this approach.  Therefore, she was started on aspirin, Plavix, Toprol, and Norvasc, and planned for cardiac catheterization.  HOSPITAL COURSE:  On January 04, 2000, Jasmine Reeves underwent cardiac catheterization by Dr. Nicki Guadalajara.  She was found to have the following: There was a tandem 90% near the ostial and proximal LAD stenosis.  The remainder of the LAD had mild irregularity but was free of significant disease.  Circumflex had a mild 20% irregularity.  There was a 40% ostial stenosis in a marginal vessel and 30% narrowing in AV groove circumflex.  The mid AV groove circumflex had 60% narrowing.  The RCA was the large dominant vessel.  There was a mild 20% narrowing in the left renal artery.  Dr. Tresa Endo then proceeded with successful cutting balloon angioplasty of the proximal near ostial left anterior descending with 90% tandem stenosis being reduced to 0% residual.  No evidence for dissection and TIMI free flow at the end of tape.  He used double bolus Integrilin and weight-adjusted heparinization.  On the morning of January 05, 2000, at approximately 2 a.m., Jasmine Reeves had a 35 beat run of nonsustained ventricular tachycardia.  At that time, the Toprol was increased, and Dr. Sharrell Ku was called for EP consultation.  On January 05, 2000, electrophysiology consultation was performed by Dr. Gilman Schmidt.  At that time, they  agreed with increased dose of Toprol, checking cardiac enzymes and EKG, follow up in the morning.  They would continue to monitor telemetry overnight and continue to monitor.  They felt that there was no need for electrophysiology study at that time.  On the morning of January 06, 2000, Jasmine Reeves was doing well without complaints.  Her groin was stable with no hematoma or bruit.  She has had no further ventricular ectopy or  dysrhythmias.  Her CK-MBs are negative x 3.  TSH was within normal range.  Electrolytes were within normal range.  She was hemodynamically stable with a pulse of 55, blood pressure 110/55, oxygen saturations 96% on room air.  She was felt to be stable for discharge home at this time by both Dr. Tresa Endo and Dr. Ladona Ridgel.  She will be planned for a follow-up Holter monitor, echocardiogram, and office visit with Dr. Tresa Endo.  HOSPITAL PROCEDURES: Cardiac catheterization by Dr. Nicki Guadalajara on January 04, 2000.  He found the following:  Left main coronary was very short and free of significant disease. In the LAD, there was a tandem 90% near ostial and proximal LAD stenosis which also encroached upon a large ______ septal perforating branch. The remainder of the LAD had mild irregularity but was free of significant disease. Circumflex had mild 20% irregularity.  There was a 40% ostial stenosis in a marginal vessel and 30% narrowing in AV groove circumflex.  The mid AV groove circumflex had 60% narrowing.  The RCA was a large caliber dominant vessel that gave rise to the PDA and a PLA.  Biplane cinearteriography showed mild angiographic mitral valve prolapse.  There was normal LV function without segmental wall motion abnormality.  Next, distal aortography revealed normal ______  system.  There was a mild 20% narrowing in the left renal artery.  Dr. Tresa Endo then proceeded with successful cutting balloon angioplasty of the proximal near the LAD with 90% tandem stenosis being reduced to 0% reducible. There was no evidence of dissection.  There was TIMI free fluid at the end of the case.  Double bolus of Integrilin and weight-adjusted heparinization were used.  HOSPITAL CONSULTATION:  Electrophysiology consultation was obtained by Dr. Sharrell Ku on January 05, 2000, for evaluation of nonsustained ventricular tachycardia.  This was subsequently controlled with increased Toprol dosage.  HOSPITAL  LABORATORY:  CK-MB were negative x 3.  The first set on January 04, 2000, showed CK of 39, MB of 2.7.  Second set showed CK of 39, MB 2.7.  Third set shows CK of 68, MB 3.2, troponin 0.45.  TSH is normal at 1.763. Electrolytes on January 06, 2000, showed sodium 138, potassium 3.9, BUN 16, creatinine 0.7, glucose 100.  CBC January 04, 2000, showed WBCs 5.4, hemoglobin 12.0, hematocrit 32.8, platelet 188.  Lipid profile revealed total cholesterol 157, triglycerides 82, HDL 43, LDL 98.  An EKG on January 04, 2000, showed sinus bradycardia 59 beats per minute with no abnormalities.  EKG on January 06, 2000, showed sinus bradycardia 50 beats per minute with no abnormalities.  DISCHARGE MEDICATIONS:  1. Enteric-coated aspirin 325 mg 1 p.o. q.d.  2. Plavix 75 mg 1 p.o. q.d.  3. Altace 2.5 mg 1 p.o. q.d.  4. Zocor 20 mg 1 p.o. q.h.s.  5. Norvasc 2.5 mg 1 p.o. q.d.  6. estradiol 2 mg 1 p.o. q.d.  7. Protonix 40 mg 1 p.o. q.d.  8. Toprol XL 50 mg 1 p.o. q.d.  9. Zyrtec as before as needed.  10. Nitroglycerin 0.4 mg sublingual as directed for chest pain.  ACTIVITY:  No strenuous activity, lifting greater than 5 lbs, traveling, or sexual activity for three days.  DIET:  Low salt, low fat, low cholesterol diet.  May wash wound with warm water and soap.  Call the office at [phone] if any bleeding or increased size or pain in her groin or if any palpitations or chest pain.  FOLLOW-UP: 1. In our office.  Holter monitor:  The office will be calling you to tell you    when to come and pick up the Holter monitor. 2. Echocardiogram is scheduled for Monday, January 11, 2000, at 3 p.m. 3. Dr. Tresa Endo scheduled for November 27 at 9:30 a.m. DD:  01/06/00 TD:  01/06/00 Job: [id number]

## 2010-12-14 LAB — APTT: aPTT: 33

## 2010-12-14 LAB — PROTIME-INR
INR: 1.1
Prothrombin Time: 14.3

## 2011-05-13 IMAGING — CR DG SHOULDER 2+V*L*
3 series · 3 of 3 positions shown · non-contrast
Comparison: None.

CLINICAL DATA: The left shoulder pain

LEFT SHOULDER - 2+ VIEW

[view not recorded (1 of 3)]
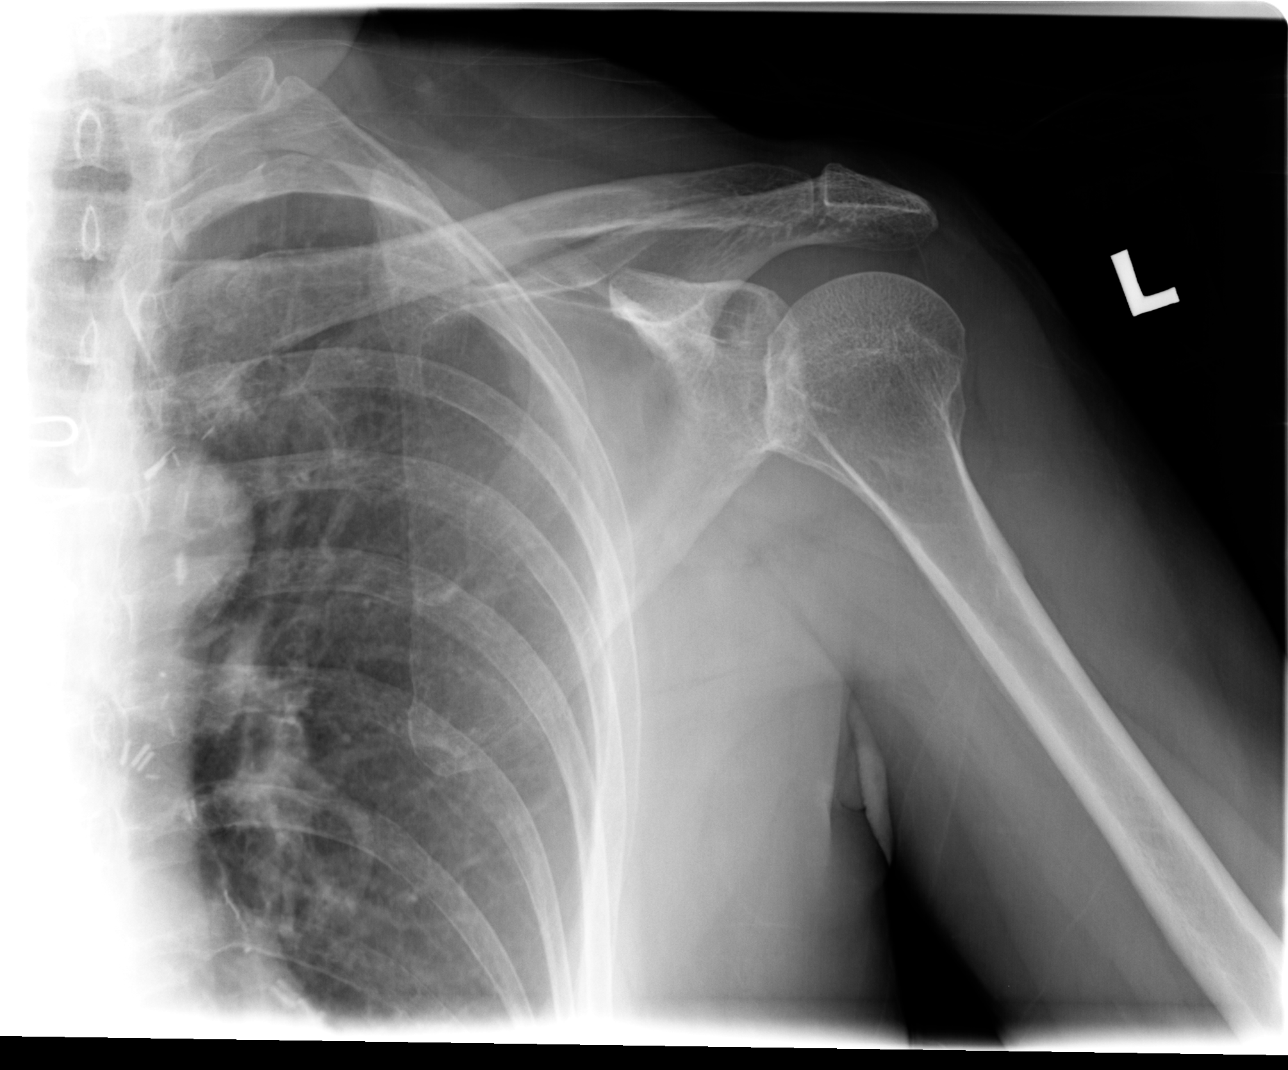

[view not recorded (2 of 3)]
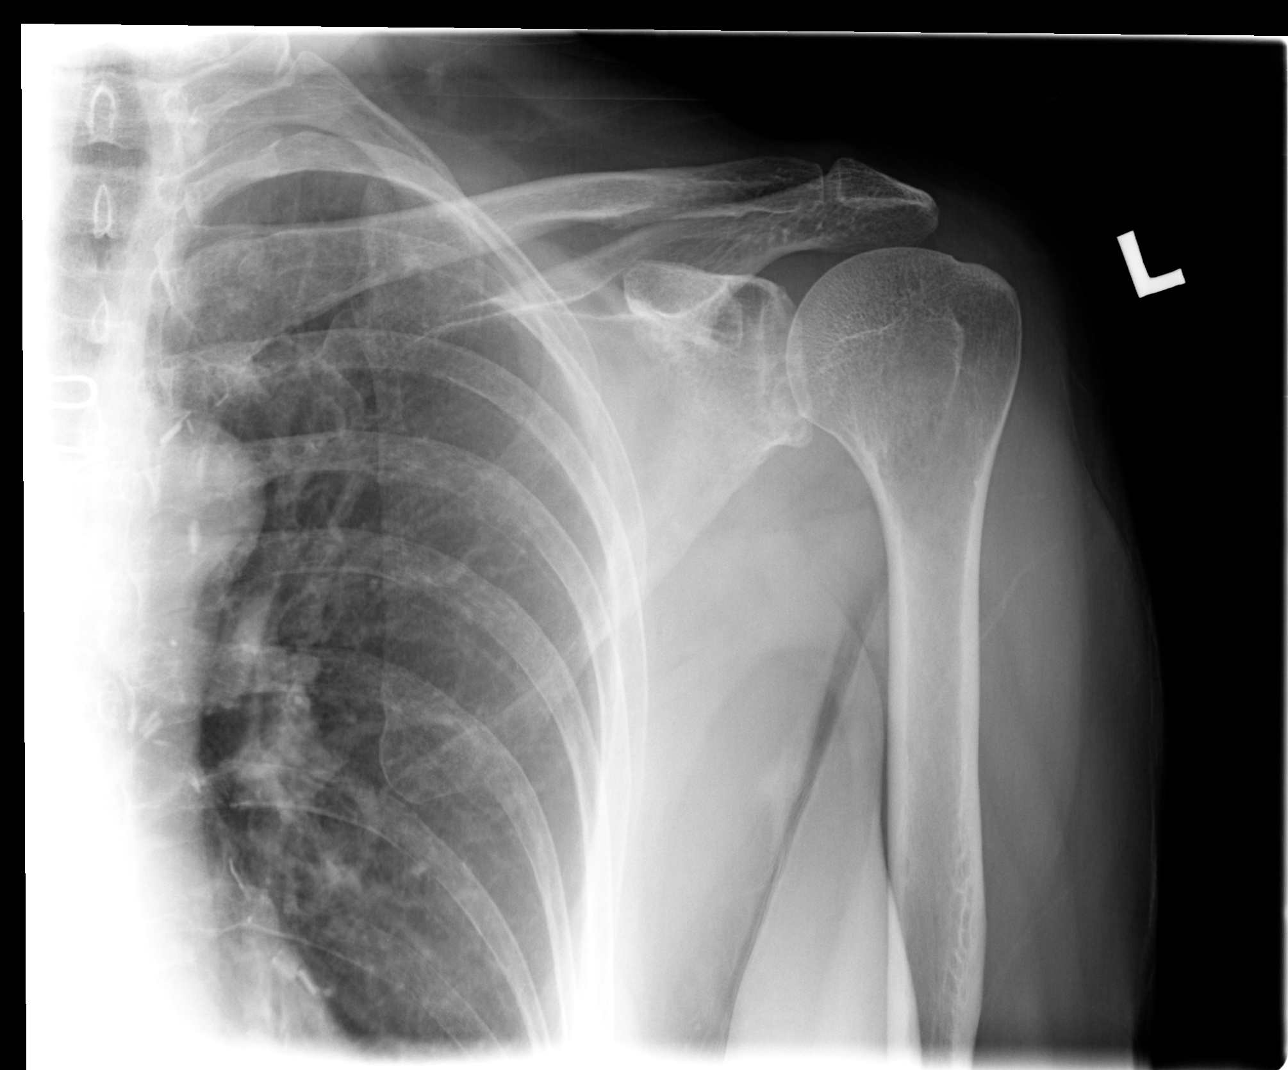

[view not recorded (3 of 3)]
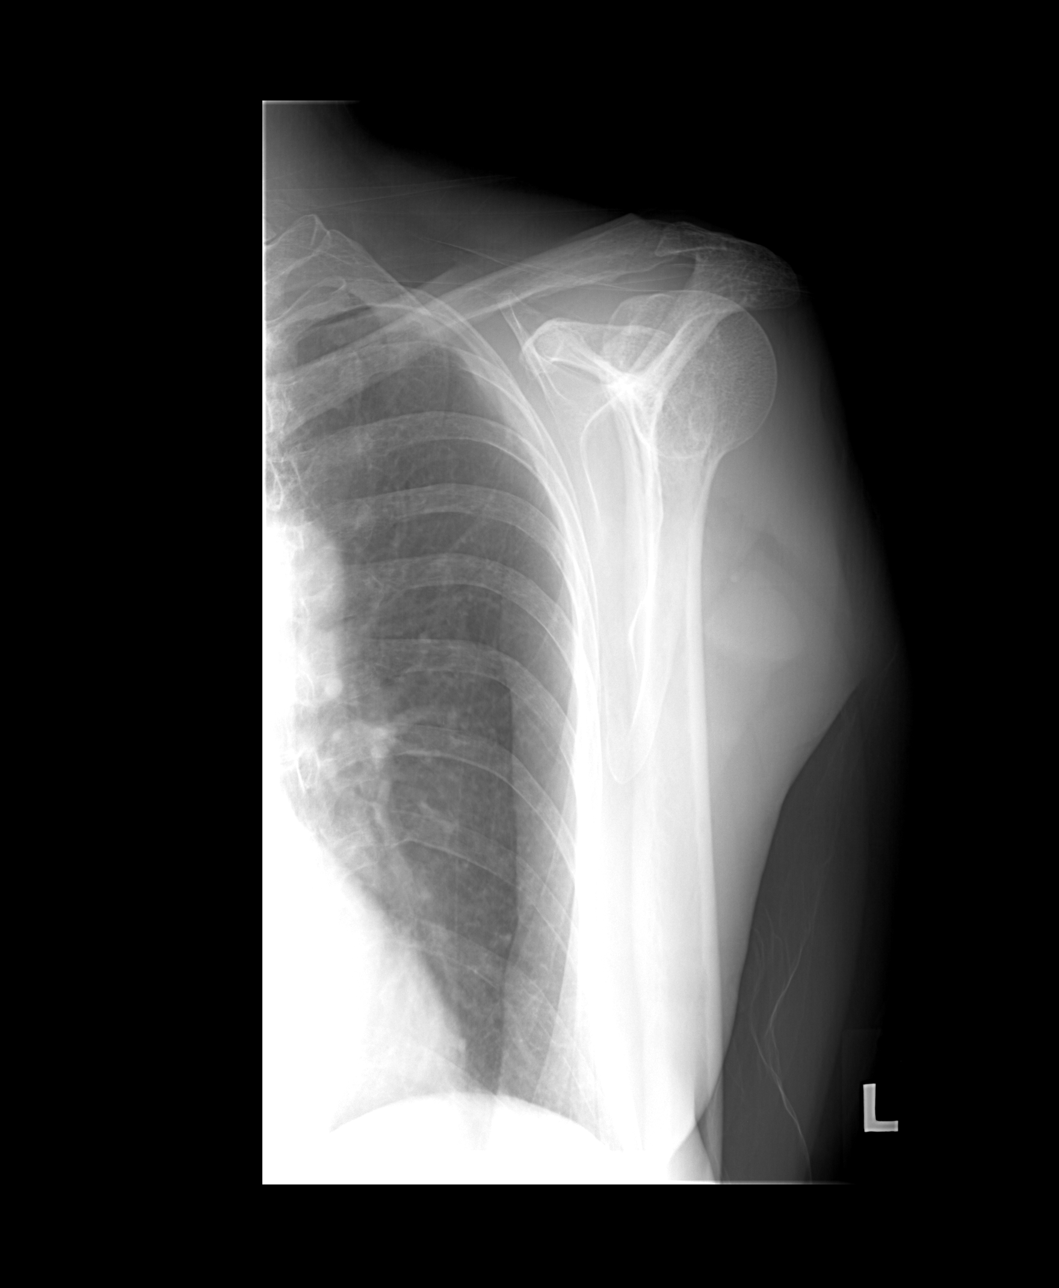

[3 of 3 positions shown; findings below may reference images not displayed]

FINDINGS: Bone density is within normal limits.  The glenoid fossa
demonstrates some inferior irregularity with suggestion of
associated subchondral cystic formation and inferior osteophytosis.
The glenohumeral joint appears maintained and no definite
degenerative changes of the humeral head are seen.  This appearance
has been described with inferior glenoid hypoplasia (dentate
glenoid) and this congenital anomaly can result in early
degenerative change of the glenoid fossa.

The acromioclavicular joint appears maintained.  No abnormal
calcifications are seen around the joint to suggest the presence of
calcific bursitis or tendonitis.
IMPRESSION: Findings suspicious for inferior glenoid hypoplasia with suspected
degenerative change.  MRI would be useful for confirmation of this
anomaly and assessment of the suspected associated degeneration.

## 2011-09-24 LAB — FECAL OCCULT BLOOD, GUAIAC: Fecal Occult Blood: NEGATIVE

## 2011-10-19 ENCOUNTER — Ambulatory Visit (HOSPITAL_COMMUNITY)
Admission: RE | Admit: 2011-10-19 | Discharge: 2011-10-19 | Disposition: A | Discharge status: Home / Self Care | Payer: Medicare Other | Source: Ambulatory Visit | Attending: Internal Medicine | Admitting: Internal Medicine

## 2011-10-19 ENCOUNTER — Other Ambulatory Visit (HOSPITAL_COMMUNITY): Payer: Self-pay | Admitting: Internal Medicine

## 2011-10-19 DIAGNOSIS — R059 Cough, unspecified: Secondary | ICD-10-CM | POA: Insufficient documentation

## 2011-10-19 DIAGNOSIS — R05 Cough: Secondary | ICD-10-CM

## 2011-10-19 DIAGNOSIS — J449 Chronic obstructive pulmonary disease, unspecified: Secondary | ICD-10-CM | POA: Insufficient documentation

## 2011-10-19 DIAGNOSIS — J4489 Other specified chronic obstructive pulmonary disease: Secondary | ICD-10-CM | POA: Insufficient documentation

## 2011-10-19 DIAGNOSIS — R0989 Other specified symptoms and signs involving the circulatory and respiratory systems: Secondary | ICD-10-CM | POA: Insufficient documentation

## 2011-10-19 DIAGNOSIS — I517 Cardiomegaly: Secondary | ICD-10-CM | POA: Insufficient documentation

## 2012-03-27 LAB — HM DEXA SCAN

## 2012-07-21 HISTORY — PX: ESOPHAGOGASTRODUODENOSCOPY: SHX1529

## 2012-07-21 LAB — HM COLONOSCOPY

## 2013-01-17 ENCOUNTER — Ambulatory Visit: Payer: Medicare PPO | Admitting: Emergency Medicine

## 2013-01-17 ENCOUNTER — Encounter: Payer: Self-pay | Admitting: Internal Medicine

## 2013-01-17 ENCOUNTER — Encounter: Payer: Self-pay | Admitting: Emergency Medicine

## 2013-01-17 VITALS — BP 122/68 | HR 66 | Temp 98.0°F | Resp 16 | Ht 64.5 in | Wt 134.0 lb

## 2013-01-17 DIAGNOSIS — I10 Essential (primary) hypertension: Secondary | ICD-10-CM

## 2013-01-17 DIAGNOSIS — E785 Hyperlipidemia, unspecified: Secondary | ICD-10-CM

## 2013-01-17 DIAGNOSIS — J309 Allergic rhinitis, unspecified: Secondary | ICD-10-CM

## 2013-01-17 DIAGNOSIS — K219 Gastro-esophageal reflux disease without esophagitis: Secondary | ICD-10-CM | POA: Insufficient documentation

## 2013-01-17 DIAGNOSIS — J449 Chronic obstructive pulmonary disease, unspecified: Secondary | ICD-10-CM

## 2013-01-17 DIAGNOSIS — R7309 Other abnormal glucose: Secondary | ICD-10-CM

## 2013-01-17 DIAGNOSIS — R059 Cough, unspecified: Secondary | ICD-10-CM

## 2013-01-17 DIAGNOSIS — D649 Anemia, unspecified: Secondary | ICD-10-CM

## 2013-01-17 DIAGNOSIS — R05 Cough: Secondary | ICD-10-CM

## 2013-01-17 DIAGNOSIS — R079 Chest pain, unspecified: Secondary | ICD-10-CM

## 2013-01-17 DIAGNOSIS — E782 Mixed hyperlipidemia: Secondary | ICD-10-CM

## 2013-01-17 DIAGNOSIS — E559 Vitamin D deficiency, unspecified: Secondary | ICD-10-CM | POA: Insufficient documentation

## 2013-01-17 LAB — CBC WITH DIFFERENTIAL/PLATELET
Basophils Absolute: 0 10*3/uL (ref 0.0–0.1)
Basophils Relative: 1 % (ref 0–1)
Eosinophils Absolute: 0 10*3/uL (ref 0.0–0.7)
Eosinophils Relative: 1 % (ref 0–5)
HCT: 38.1 % (ref 36.0–46.0)
Hemoglobin: 13.1 g/dL (ref 12.0–15.0)
Lymphocytes Relative: 22 % (ref 12–46)
Lymphs Abs: 0.8 10*3/uL (ref 0.7–4.0)
MCH: 31 pg (ref 26.0–34.0)
MCHC: 34.4 g/dL (ref 30.0–36.0)
MCV: 90.1 fL (ref 78.0–100.0)
Monocytes Absolute: 0.4 10*3/uL (ref 0.1–1.0)
Monocytes Relative: 12 % (ref 3–12)
Neutro Abs: 2.3 10*3/uL (ref 1.7–7.7)
Neutrophils Relative %: 64 % (ref 43–77)
Platelets: 182 10*3/uL (ref 150–400)
RBC: 4.23 MIL/uL (ref 3.87–5.11)
RDW: 13 % (ref 11.5–15.5)
WBC: 3.5 10*3/uL — ABNORMAL LOW (ref 4.0–10.5)

## 2013-01-17 LAB — BASIC METABOLIC PANEL WITH GFR
BUN: 12 mg/dL (ref 6–23)
CO2: 28 mEq/L (ref 19–32)
Calcium: 9.1 mg/dL (ref 8.4–10.5)
Chloride: 103 mEq/L (ref 96–112)
Creat: 0.7 mg/dL (ref 0.50–1.10)
GFR, Est African American: >89 mL/min
GFR, Est Non African American: 88 mL/min
Glucose, Bld: 80 mg/dL (ref 70–99)
Potassium: 3.9 mEq/L (ref 3.5–5.3)
Sodium: 139 mEq/L (ref 135–145)

## 2013-01-17 LAB — HEPATIC FUNCTION PANEL
ALT: 20 U/L (ref 0–35)
AST: 22 U/L (ref 0–37)
Albumin: 4.2 g/dL (ref 3.5–5.2)
Alkaline Phosphatase: 44 U/L (ref 39–117)
Bilirubin, Direct: 0.1 mg/dL (ref 0.0–0.3)
Indirect Bilirubin: 0.4 mg/dL (ref 0.0–0.9)
Total Bilirubin: 0.5 mg/dL (ref 0.3–1.2)
Total Protein: 6.1 g/dL (ref 6.0–8.3)

## 2013-01-17 LAB — LIPID PANEL
Cholesterol: 130 mg/dL (ref 0–200)
HDL: 55 mg/dL (ref >39)
LDL Cholesterol: 63 mg/dL (ref 0–99)
Total CHOL/HDL Ratio: 2.4 Ratio
Triglycerides: 58 mg/dL (ref <150)
VLDL: 12 mg/dL (ref 0–40)

## 2013-01-17 MED ORDER — BENZONATATE 100 MG PO CAPS: 100.0000 mg | ORAL_CAPSULE | Freq: Three times a day (TID) | ORAL | Status: DC | PRN

## 2013-01-17 MED ORDER — PREDNISONE 10 MG PO TABS: ORAL_TABLET | ORAL | Status: DC

## 2013-01-17 MED ORDER — FLUTICASONE FUROATE 27.5 MCG/SPRAY NA SUSP: 2.0000 | Freq: Every day | NASAL | Status: DC | PRN

## 2013-01-17 MED ORDER — NITROGLYCERIN 0.4 MG SL SUBL: 0.4000 mg | SUBLINGUAL_TABLET | SUBLINGUAL | Status: DC | PRN

## 2013-01-17 NOTE — Patient Instructions (Addendum)
Cough, Adult  A cough is a reflex that helps clear your throat and airways. It can help heal the body or may be a reaction to an irritated airway. A cough may only last 2 or 3 weeks (acute) or may last more than 8 weeks (chronic).  CAUSES Acute cough:  Viral or bacterial infections. Chronic cough:  Infections.  Allergies.  Asthma.  Post-nasal drip.  Smoking.  Heartburn or acid reflux.  Some medicines.  Chronic lung problems (COPD).  Cancer. SYMPTOMS   Cough.  Fever.  Chest pain.  Increased breathing rate.  High-pitched whistling sound when breathing (wheezing).  Colored mucus that you cough up (sputum). TREATMENT   A bacterial cough may be treated with antibiotic medicine.  A viral cough must run its course and will not respond to antibiotics.  Your caregiver may recommend other treatments if you have a chronic cough. HOME CARE INSTRUCTIONS   Only take over-the-counter or prescription medicines for pain, discomfort, or fever as directed by your caregiver. Use cough suppressants only as directed by your caregiver.  Use a cold steam vaporizer or humidifier in your bedroom or home to help loosen secretions.  Sleep in a semi-upright position if your cough is worse at night.  Rest as needed.  Stop smoking if you smoke. SEEK IMMEDIATE MEDICAL CARE IF:   You have pus in your sputum.  Your cough starts to worsen.  You cannot control your cough with suppressants and are losing sleep.  You begin coughing up blood.  You have difficulty breathing.  You develop pain which is getting worse or is uncontrolled with medicine.  You have a fever. MAKE SURE YOU:   Understand these instructions.  Will watch your condition.  Will get help right away if you are not doing well or get worse. Document Released: 08/14/2010 Document Revised: 05/10/2011 Document Reviewed: 08/14/2010 [facility] Patient Information 2014 [place], [place]. Gastroesophageal Reflux Disease,  Adult Gastroesophageal reflux disease (GERD) happens when acid from your stomach flows up into the esophagus. When acid comes in contact with the esophagus, the acid causes soreness (inflammation) in the esophagus. Over time, GERD may create small holes (ulcers) in the lining of the esophagus. CAUSES  Increased body weight. This puts pressure on the stomach, making acid rise from the stomach into the esophagus. Smoking. This increases acid production in the stomach. Drinking alcohol. This causes decreased pressure in the lower esophageal sphincter (valve or ring of muscle between the esophagus and stomach), allowing acid from the stomach into the esophagus. Late evening meals and a full stomach. This increases pressure and acid production in the stomach. A malformed lower esophageal sphincter. Sometimes, no cause is found. SYMPTOMS  Burning pain in the lower part of the mid-chest behind the breastbone and in the mid-stomach area. This may occur twice a week or more often. Trouble swallowing. Sore throat. Dry cough. Asthma-like symptoms including chest tightness, shortness of breath, or wheezing. DIAGNOSIS  Your caregiver may be able to diagnose GERD based on your symptoms. In some cases, X-rays and other tests may be done to check for complications or to check the condition of your stomach and esophagus. TREATMENT  Your caregiver may recommend over-the-counter or prescription medicines to help decrease acid production. Ask your caregiver before starting or adding any new medicines.  HOME CARE INSTRUCTIONS  Change the factors that you can control. Ask your caregiver for guidance concerning weight loss, quitting smoking, and alcohol consumption. Avoid foods and drinks that make your symptoms worse, such  as: Caffeine or alcoholic drinks. Chocolate. Peppermint or mint flavorings. Garlic and onions. Spicy foods. Citrus fruits, such as oranges, lemons, or limes. Tomato-based foods such as  sauce, chili, salsa, and pizza. Fried and fatty foods. Avoid lying down for the 3 hours prior to your bedtime or prior to taking a nap. Eat small, frequent meals instead of large meals. Wear loose-fitting clothing. Do not wear anything tight around your waist that causes pressure on your stomach. Raise the head of your bed 6 to 8 inches with wood blocks to help you sleep. Extra pillows will not help. Only take over-the-counter or prescription medicines for pain, discomfort, or fever as directed by your caregiver. Do not take aspirin, ibuprofen, or other nonsteroidal anti-inflammatory drugs (NSAIDs). SEEK IMMEDIATE MEDICAL CARE IF:  You have pain in your arms, neck, jaw, teeth, or back. Your pain increases or changes in intensity or duration. You develop nausea, vomiting, or sweating (diaphoresis). You develop shortness of breath, or you faint. Your vomit is green, yellow, black, or looks like coffee grounds or blood. Your stool is red, bloody, or black. These symptoms could be signs of other problems, such as heart disease, gastric bleeding, or esophageal bleeding. MAKE SURE YOU:  Understand these instructions. Will watch your condition. Will get help right away if you are not doing well or get worse. Document Released: 11/25/2004 Document Revised: 05/10/2011 Document Reviewed: 09/04/2010 [facility] Patient Information 2014 [place], [place].

## 2013-01-18 ENCOUNTER — Other Ambulatory Visit: Payer: Self-pay | Admitting: Emergency Medicine

## 2013-01-18 ENCOUNTER — Ambulatory Visit: Payer: Self-pay | Admitting: Emergency Medicine

## 2013-01-18 LAB — HEMOGLOBIN A1C
Hgb A1c MFr Bld: 5.8 % — ABNORMAL HIGH (ref <5.7)
Mean Plasma Glucose: 120 mg/dL — ABNORMAL HIGH (ref <117)

## 2013-01-18 LAB — INSULIN, FASTING: Insulin fasting, serum: 16 u[IU]/mL (ref 3–28)

## 2013-01-18 MED ORDER — AMOXICILLIN 875 MG PO TABS: 875.0000 mg | ORAL_TABLET | Freq: Two times a day (BID) | ORAL | Status: DC

## 2013-01-18 NOTE — Progress Notes (Signed)
Subjective:    Patient ID: Jasmine Reeves, female    DOB: 10/17/1940, 72 y.o.   MRN: [id number]  HPI Comments: 72 yo female presents for 3 month F/U for HTN, Cholesterol, Pre-Dm, D. Deficient. She is doing well overall. She has not been exercising as much with colder weather. She has not been eating as healthy with increased stress with sick son. BP has been good at home. She has noticed increased cough and allergy drainage over 1 week, but also has occasional night time reflux which wakes her. She denies any trigger for reflux except stress.      Current Outpatient Prescriptions on File Prior to Visit  Medication Sig Dispense Refill  . aspirin 81 MG tablet Take 81 mg by mouth daily.      Marland Kitchen diltiazem (DILACOR XR) 180 MG 24 hr capsule Take 180 mg by mouth daily.      Marland Kitchen ezetimibe-simvastatin (VYTORIN) 10-20 MG per tablet Take 1 tablet by mouth daily.      . ferrous fumarate (HEMOCYTE - 106 MG FE) 325 (106 FE) MG TABS tablet Take 1 tablet by mouth.      . fexofenadine (ALLEGRA) 180 MG tablet Take 180 mg by mouth daily.      Marland Kitchen labetalol (NORMODYNE) 100 MG tablet Take 100 mg by mouth. 1.5 pills  = 150 mg BID      . losartan (COZAAR) 25 MG tablet Take 25 mg by mouth daily.      . Multiple Vitamins-Minerals (MULTIVITAMIN WITH MINERALS) tablet Take 1 tablet by mouth daily.      . pantoprazole (PROTONIX) 40 MG tablet Take 40 mg by mouth daily.      . mometasone (NASONEX) 50 MCG/ACT nasal spray Place 2 sprays into the nose daily.       No current facility-administered medications on file prior to visit.   ALLERGIES Altace; Azithromycin; Celebrex; Hydrochlorothiazide; and Prilosec  Past Medical History  Diagnosis Date  . Hypertension   . Hyperlipidemia   . GERD (gastroesophageal reflux disease)   . COPD (chronic obstructive pulmonary disease)   . Anemia   . Fibromyalgia   . Unspecified vitamin D deficiency   . Raynaud disease   . Scoliosis   . IBS (irritable bowel syndrome)   . ASHD  (arteriosclerotic heart disease)     Review of Systems  HENT: Positive for postnasal drip.   Respiratory: Positive for cough.   Gastrointestinal:       Reflux  All other systems reviewed and are negative.    BP 122/68  Pulse 66  Temp(Src) 98 F (36.7 C) (Temporal)  Resp 16  Ht 5' 4.5" (1.638 m)  Wt 134 lb (60.782 kg)  BMI 22.65 kg/m2     Objective:   Physical Exam  Nursing note and vitals reviewed. Constitutional: She is oriented to person, place, and time. She appears well-developed and well-nourished.  HENT:  Head: Normocephalic and atraumatic.  Right Ear: External ear normal.  Left Ear: External ear normal.  Nose: Nose normal.  Mouth/Throat: Oropharynx is clear and moist.  Post pharynx with cobble stone appearance,clear exudate, mild erythema  Eyes: Pupils are equal, round, and reactive to light.  Neck: Normal range of motion. Neck supple. No JVD present. No thyromegaly present.  Cardiovascular: Normal rate, regular rhythm, normal heart sounds and intact distal pulses.   Pulmonary/Chest: Effort normal and breath sounds normal.  Abdominal: Soft. Bowel sounds are normal. She exhibits no mass. There is no tenderness.  Musculoskeletal: Normal  range of motion.  Lymphadenopathy:    She has no cervical adenopathy.  Neurological: She is alert and oriented to person, place, and time. No cranial nerve deficit.  Skin: Skin is warm and dry.  Psychiatric: She has a normal mood and affect. Judgment normal.          Assessment & Plan:  1.  3 month F/U for HTN, Cholesterol, Pre-Dm, D. Deficient Improve diet and exercise, decrease stress, check labs. 2. Cough, probably allergies vs reflux. Restart Nose sprat AD, Pred 10 mg DP, Tessalon perles 100 mg AD, push fluids will call if symptoms increase. Reflux hygiene explained. 3. HX of angina wants  SL NTG RX. No recent symptoms will fill

## 2013-01-19 ENCOUNTER — Telehealth: Payer: Self-pay | Admitting: *Deleted

## 2013-01-19 NOTE — Telephone Encounter (Signed)
Message copied by Nicholaus Corolla A on Fri Jan 19, 2013 12:37 PM ------      Message from: [place], [place] R      Created: Thu Jan 18, 2013  7:13 AM       All ok a1c still mild elevation, continue to improve diet and exercise, continue all rest the same. Recheck at [facility] in 2/15 ------

## 2013-01-22 NOTE — Telephone Encounter (Signed)
Spoke with patient about labs and instructions.  Patient will continue to work on diet and exercise to lower A1C.

## 2013-01-31 ENCOUNTER — Encounter: Payer: Self-pay | Admitting: Cardiovascular Disease

## 2013-01-31 ENCOUNTER — Ambulatory Visit (INDEPENDENT_AMBULATORY_CARE_PROVIDER_SITE_OTHER): Payer: Medicare HMO | Admitting: Cardiovascular Disease

## 2013-01-31 VITALS — BP 120/76 | HR 51 | Ht 66.0 in | Wt 134.9 lb

## 2013-01-31 DIAGNOSIS — I2510 Atherosclerotic heart disease of native coronary artery without angina pectoris: Secondary | ICD-10-CM

## 2013-01-31 DIAGNOSIS — I10 Essential (primary) hypertension: Secondary | ICD-10-CM

## 2013-01-31 DIAGNOSIS — I059 Rheumatic mitral valve disease, unspecified: Secondary | ICD-10-CM

## 2013-01-31 DIAGNOSIS — I341 Nonrheumatic mitral (valve) prolapse: Secondary | ICD-10-CM

## 2013-01-31 DIAGNOSIS — I7300 Raynaud's syndrome without gangrene: Secondary | ICD-10-CM

## 2013-01-31 DIAGNOSIS — E785 Hyperlipidemia, unspecified: Secondary | ICD-10-CM

## 2013-01-31 NOTE — Patient Instructions (Signed)
Your physician has requested that you have en exercise stress myoview and office appointment in 6 MONTHS.Marland Kitchen

## 2013-01-31 NOTE — Progress Notes (Signed)
Patient ID: Jasmine Reeves, female   DOB: [birth year], 72 y.o.   MRN: [id number]     HPI: Jasmine Reeves is a 72 y.o. female presents for 1 year cardiology evaluation.  [name] underwent CABG revascularization surgery x4 on 03/02/2000 by Dr. ON. At that time, she had a LIMA placed her distal LAD, vein to the second diagonal, sequential vein to 2 marginal vessels arising from the circumflex coronary artery. A right coronary artery was free of disease and was not bypassed. Additional problems include mitral valve prolapse, may not disease, history of short bursts of nonsustained tachycardia on treadmill testing with reference to this on beta blocker therapy.,. She did develop edema remotely on amlodipine. She does have a history of documented APCs on Holter imaging which have improved with labetalol.  Over the past year, she is continued to be stable. She denies chest tightness. She is unaware of tachypalpitations. She has been under recent increased stress with her son being diagnosed with stage I rectal cancer but fortunately is doing well following surgery. She recently had laboratory drawn 2 weeks ago which stable hemoglobin and hematocrit. Electrolytes were normal. Cholesterol is excellent at 1:30 with triglycerides 58 HDL 55 LDL 63 her current medical regimen. Hemoglobin A1c was 5.8.  Past Medical History  Diagnosis Date  . Hypertension   . Hyperlipidemia   . GERD (gastroesophageal reflux disease)   . COPD (chronic obstructive pulmonary disease)   . Anemia   . Fibromyalgia   . Unspecified vitamin D deficiency   . Raynaud disease   . Scoliosis   . IBS (irritable bowel syndrome)   . ASHD (arteriosclerotic heart disease)     Past Surgical History  Procedure Laterality Date  . Tonsillectomy    . Appendectomy    . Abdominal hysterectomy    . Tubal ligation    . Coronary artery bypass graft    . Coronary angioplasty  12/11  . Esophagogastroduodenoscopy  07/21/12    polyps, Hiatal hernia  Dr. Matthias Hughs    Allergies  Allergen Reactions  . Altace [Ramipril]     cough  . Azithromycin     palpitation  . Celebrex [Celecoxib]     Gi upset   . Hydrochlorothiazide     cramping  . Prilosec [Omeprazole]     constipation    Current Outpatient Prescriptions  Medication Sig Dispense Refill  . aspirin 81 MG tablet Take 81 mg by mouth daily.      . Calcium Carb-Cholecalciferol (CALCIUM 1000 + D PO) Take 1 tablet by mouth daily.      . Cholecalciferol (VITAMIN D) 2000 UNITS tablet Take 2,000 Units by mouth 2 (two) times daily.      Marland Kitchen diltiazem (DILACOR XR) 180 MG 24 hr capsule Take 180 mg by mouth daily.      Marland Kitchen ezetimibe-simvastatin (VYTORIN) 10-20 MG per tablet Take 1 tablet by mouth daily.      . ferrous fumarate (HEMOCYTE - 106 MG FE) 325 (106 FE) MG TABS tablet Take 1 tablet by mouth.      . fexofenadine (ALLEGRA) 180 MG tablet Take 180 mg by mouth daily.      . fluticasone (VERAMYST) 27.5 MCG/SPRAY nasal spray Place 2 sprays into the nose daily as needed for rhinitis.  10 g  8  . hydrochlorothiazide (HYDRODIURIL) 25 MG tablet Take 25 mg by mouth daily. Takes 1/2 daily = 12.5mg       . labetalol (NORMODYNE) 100 MG tablet Take 100 mg  by mouth. 1.5 pills  = 150 mg BID      . losartan (COZAAR) 25 MG tablet Take 25 mg by mouth daily.      . Magnesium 400 MG CAPS Take by mouth daily.      . Multiple Vitamins-Minerals (MULTIVITAMIN WITH MINERALS) tablet Take 1 tablet by mouth daily.      . nitroGLYCERIN (NITROSTAT) 0.4 MG SL tablet Place 1 tablet (0.4 mg total) under the tongue every 5 (five) minutes as needed for chest pain.  25 tablet  12  . pantoprazole (PROTONIX) 40 MG tablet Take 40 mg by mouth daily.      . TURMERIC PO Take by mouth. Pt not sure of dosage      . vitamin C (ASCORBIC ACID) 500 MG tablet Take 500 mg by mouth daily.       No current facility-administered medications for this visit.    History   Social History  . Marital Status: Married    Spouse Name: N/A     Number of Children: N/A  . Years of Education: N/A   Occupational History  . Not on file.   Social History Main Topics  . Smoking status: Former Smoker    Start date: 02/01/1963  . Smokeless tobacco: Not on file  . Alcohol Use: Yes     Comment: rare  . Drug Use: No  . Sexual Activity: Not on file   Other Topics Concern  . Not on file   Social History Narrative  . No narrative on file   Socially she is married has 2 children 3 grandchildren. She does not routinely exercise. There is no tobacco use. She does drink occasional alcohol. She is retired Holiday representative.  Family History  Problem Relation Age of Onset  . Heart disease Brother   . Hyperlipidemia Brother     ROS is negative for fevers, chills or night sweats.  She denies skin rash. She denies visual changes. She denies change in hearing. She is unaware lymphadenopathy. She denies cough or increased sputum production. She is unaware of palpitations. There is no presacral presyncope. There is no chest tightness. She denies abdominal pain nausea vomiting. There is no blood in stool or urine. Time she does have GERD. She denies claudication. She denies recent attacks of right nausea and has done well since instituting diltiazem. She denies seizures. She denies thyroid issues. There is no overt diabetes. Other comprehensive 14 point system review is negative.  PE BP 120/76  Pulse 51  Ht 5\' 6"  (1.676 m)  Wt 61.19 kg (134 lb 14.4 oz)  BMI 21.78 kg/m2  General: Alert, oriented, no distress.  Skin: normal turgor, no rashes HEENT: Normocephalic, atraumatic. Pupils round and reactive; sclera anicteric;no lid lag.  Nose without nasal septal hypertrophy Mouth/Parynx benign; Mallinpatti scale 2 Neck: No JVD, no carotid briuts Lungs: clear to ausculatation and percussion; no wheezing or rales Heart: RRR, s1 s2 normal a 1/6 systolic murmur, soft systolic click.  Abdomen: soft, nontender; no hepatosplenomehaly, BS+;  abdominal aorta nontender and not dilated by palpation. Pulses 2+ Extremities: no clubbing cyanosis or edema, Homan's sign negative  Neurologic: grossly nonfocal Psychologic: normal affect and mood.  ECG: Sinus rhythm at 51 beats per minute. Non-specific T changes.  LABS:  BMET    Component Value Date/Time   NA 139 01/17/2013 1721   K 3.9 01/17/2013 1721   CL 103 01/17/2013 1721   CO2 28 01/17/2013 1721   GLUCOSE 80 01/17/2013  1721   BUN 12 01/17/2013 1721   CREATININE 0.70 01/17/2013 1721   CREATININE .7 08/27/2009 1732   CALCIUM 9.1 01/17/2013 1721   GFRNONAA >60 08/27/2009 1732   GFRAA  Value: >60        The eGFR has been calculated using the MDRD equation. This calculation has not been validated in all clinical situations. eGFR's persistently <60 mL/min signify possible Chronic Kidney Disease. 08/27/2009 1732     Hepatic Function Panel     Component Value Date/Time   PROT 6.1 01/17/2013 1721   ALBUMIN 4.2 01/17/2013 1721   AST 22 01/17/2013 1721   ALT 20 01/17/2013 1721   ALKPHOS 44 01/17/2013 1721   BILITOT 0.5 01/17/2013 1721   BILIDIR 0.1 01/17/2013 1721   IBILI 0.4 01/17/2013 1721     CBC    Component Value Date/Time   WBC 3.5* 01/17/2013 1721   RBC 4.23 01/17/2013 1721   HGB 13.1 01/17/2013 1721   HCT 38.1 01/17/2013 1721   PLT 182 01/17/2013 1721   MCV 90.1 01/17/2013 1721   MCH 31.0 01/17/2013 1721   MCHC 34.4 01/17/2013 1721   RDW 13.0 01/17/2013 1721   LYMPHSABS 0.8 01/17/2013 1721   MONOABS 0.4 01/17/2013 1721   EOSABS 0.0 01/17/2013 1721   BASOSABS 0.0 01/17/2013 1721     BNP No results found for this basename: probnp    Lipid Panel     Component Value Date/Time   CHOL 130 01/17/2013 1721   TRIG 58 01/17/2013 1721   HDL 55 01/17/2013 1721   CHOLHDL 2.4 01/17/2013 1721   VLDL 12 01/17/2013 1721   LDLCALC 63 01/17/2013 1721     RADIOLOGY: No results found.    ASSESSMENT AND PLAN: Ms. Takayla Baillie is now 72 years old. Cardiac  catheterization on New Year's Eve 2001 showed severe two-vessel coronary artery disease for which she underwent CABG revascularization surgery on 03/02/2000. Her last catheterization was in 2008 which showed patent grafts. She has normal LV function. Also has document of mitral valve prolapse. I have any further ectopy on low-dose beta blocker therapy. Os has been controlled with Cardizem. She was told in the past that she may have mild COPD. Clinically she is stable her last nuclear perfusion study was in 2012. In 6 months, am recommending she undergo a three-year followup nuclear perfusion scan and I will see her in the office in followup and further recommendations are made at that time.     Lennette Bihari, MD, [facility]  02/03/2013 11:03 AM

## 2013-02-03 ENCOUNTER — Encounter: Payer: Self-pay | Admitting: Cardiovascular Disease

## 2013-02-03 DIAGNOSIS — I341 Nonrheumatic mitral (valve) prolapse: Secondary | ICD-10-CM | POA: Insufficient documentation

## 2013-02-03 DIAGNOSIS — I2510 Atherosclerotic heart disease of native coronary artery without angina pectoris: Secondary | ICD-10-CM | POA: Insufficient documentation

## 2013-02-05 ENCOUNTER — Other Ambulatory Visit: Payer: Self-pay | Admitting: Internal Medicine

## 2013-03-30 NOTE — Patient Instructions (Signed)

## 2013-03-30 NOTE — Progress Notes (Signed)
Annual Screening Comprehensive Examination  This very nice 73 y.o. MWF presents for complete physical.  Patient has been followed for HTN, ASHD/CABG,  Prediabetes, Hyperlipidemia, DJD/Scoliosis/Fibromyalgia, , Raynaud's , and Vitamin D Deficiency.  HTN predates since 2001when she presented with ACS and underwent cutting angioplasty followed by CABG and has beenfollowed by Dr Tresa Endo since. She did have a negative perfusion study in Dec 2011. Patient's BP has been controlled at home. Today's BP: 118/66 mmHg. Patient denies any cardiac symptoms as chest pain, palpitations, shortness of breath, dizziness or ankle swelling.  Patient's hyperlipidemia is controlled with diet and medications. Patient denies myalgias or other medication SE's. Cholesterol last visit was: Lab Results  Component Value Date   CHOL 130 01/17/2013   HDL 55 01/17/2013   LDLCALC 63 01/17/2013   TRIG 58 01/17/2013   CHOLHDL 2.4 01/17/2013    She does have Hx/o A1c 6.3% in 2012 .Patient denies reactive hypoglycemic symptoms, visual blurring, diabetic polys, or paresthesias. Last A1C : Lab Results  Component Value Date   HGBA1C 5.8* 01/17/2013    Finally, patient has history of Vitamin D Deficiency 50 in 2008  with last vitamin D  88 in August 2014.      Medication List         aspirin 81 MG tablet  Take 81 mg by mouth daily.     CALCIUM 1000 + D PO  Take 1 tablet by mouth daily.     diltiazem 180 MG 24 hr capsule  Commonly known as:  DILACOR XR  Take 180 mg by mouth daily.     ezetimibe-simvastatin 10-20 MG per tablet  Commonly known as:  VYTORIN  Take 1 tablet by mouth daily.     ferrous fumarate 325 (106 FE) MG Tabs tablet  Commonly known as:  HEMOCYTE - 106 mg FE  Take 1 tablet by mouth.     fexofenadine 180 MG tablet  Commonly known as:  ALLEGRA  Take 180 mg by mouth daily.     fluticasone 27.5 MCG/SPRAY nasal spray  Commonly known as:  VERAMYST  Place 2 sprays into the nose daily as needed for  rhinitis.     hydrochlorothiazide 25 MG tablet  Commonly known as:  HYDRODIURIL  TAKE 1 TABLET BY MOUTH DAILY FOR FLUID     labetalol 100 MG tablet  Commonly known as:  NORMODYNE  Take 100 mg by mouth. 1.5 pills  = 150 mg BID     losartan 50 MG tablet  Commonly known as:  COZAAR  TAKE 1/2 TO 1 TABLET BY MOUTH EVERY DAY FOR BLOOD PRESSURE     Magnesium 400 MG Caps  Take by mouth daily.     multivitamin with minerals tablet  Take 1 tablet by mouth daily.     nitroGLYCERIN 0.4 MG SL tablet  Commonly known as:  NITROSTAT  Place 1 tablet (0.4 mg total) under the tongue every 5 (five) minutes as needed for chest pain.     pantoprazole 40 MG tablet  Commonly known as:  PROTONIX  Take 40 mg by mouth daily.     TURMERIC PO  Take 900 mg by mouth daily.     vitamin C 500 MG tablet  Commonly known as:  ASCORBIC ACID  Take 500 mg by mouth daily.     Vitamin D 2000 UNITS tablet  Take 2,000 Units by mouth 2 (two) times daily.        Allergies  Allergen Reactions  . Altace [Ramipril]  cough  . Azithromycin     palpitation  . Celebrex [Celecoxib]     Gi upset   . Hydrochlorothiazide     cramping  . 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 Prilosec [Omeprazole]     constipation    Past Medical History  Diagnosis Date  . Hypertension   . Hyperlipidemia   . GERD (gastroesophageal reflux disease)   . COPD (chronic obstructive pulmonary disease)   . Anemia   . Fibromyalgia   . Unspecified vitamin D deficiency   . Raynaud disease   . Scoliosis   . IBS (irritable bowel syndrome)   . ASHD (arteriosclerotic heart disease)     Past Surgical History  Procedure Laterality Date  . Tonsillectomy    . Appendectomy    . Abdominal hysterectomy    . Tubal ligation    . Coronary artery bypass graft    . Coronary angioplasty  12/11  . Esophagogastroduodenoscopy  07/21/12    polyps, Hiatal hernia Dr. Matthias Hughs    Family History  Problem  Relation Age of Onset  . Heart disease Brother   . Hyperlipidemia Brother     History  Substance Use Topics  . Smoking status: Former Smoker    Start date: 02/01/1963  . Smokeless tobacco: Not on file  . Alcohol Use: 1.5 oz/week    3 drink(s) per week     Comment: rare    ROS Constitutional: Denies fever, chills, weight loss/gain, headaches, insomnia, fatigue, night sweats, and change in appetite. Eyes: Denies redness, blurred vision, diplopia, discharge, itchy, watery eyes.  ENT: Denies discharge, congestion, post nasal drip, epistaxis, sore throat, earache, hearing loss, dental pain, Tinnitus, Vertigo, Sinus pain, snoring.  Cardio: Denies chest pain, palpitations, irregular heartbeat, syncope, dyspnea, diaphoresis, orthopnea, PND, claudication, edema Respiratory: denies cough, dyspnea, DOE, pleurisy, hoarseness, laryngitis, wheezing.  Gastrointestinal: Denies dysphagia, heartburn, reflux, water brash, pain, cramps, nausea, vomiting, bloating, diarrhea, constipation, hematemesis, melena, hematochezia, jaundice, hemorrhoids Genitourinary: Denies dysuria, frequency, urgency, nocturia, hesitancy, discharge, hematuria, flank pain Breast:Breast lumps, nipple discharge, bleeding.  Musculoskeletal: Denies arthralgia, myalgia, stiffness, Jt. Swelling, pain, limp, and strain/sprain. Skin: Denies puritis, rash, hives, warts, acne, eczema, changing in skin lesion Neuro: No weakness, tremor, incoordination, spasms, paresthesia, pain Psychiatric: Denies confusion, memory loss, sensory loss Endocrine: Denies change in weight, skin, hair change, nocturia, and paresthesia, diabetic polys, visual blurring, hyper / hypo glycemic episodes.  Heme/Lymph: No excessive bleeding, bruising, enlarged lymph nodes.  BP: 118/66  Pulse: 64  Temp: 98.1 F (36.7 C)  Resp: 16    Estimated body mass index is 22.73 kg/(m^2) as calculated from the following:   Height as of this encounter: 5\' 5"  (1.651 m).    Weight as of this encounter: 136 lb 9.6 oz (61.961 kg).  Physical Exam General Appearance: Well nourished, in no apparent distress. Eyes: PERRLA, EOMs, conjunctiva no swelling or erythema, normal fundi and vessels. Sinuses: No frontal/maxillary tenderness ENT/Mouth: EACs patent / TMs  nl. Nares clear without erythema, swelling, mucoid exudates. Oral hygiene is good. No erythema, swelling, or exudate. Tongue normal, non-obstructing. Tonsils not swollen or erythematous. Hearing normal.  Neck: Supple, thyroid normal. No bruits, nodes or JVD. Respiratory: Respiratory effort normal.  BS equal and clear bilateral without rales, rhonci, wheezing or stridor. Cardio: Heart sounds are normal with regular rate and rhythm and no murmurs, rubs or gallops. Peripheral pulses are normal and equal bilaterally without edema. No aortic or femoral bruits. Chest: symmetric  with normal excursions and percussion. Breasts: Symmetric, without lumps, nipple discharge, retractions, or fibrocystic changes.  Abdomen: Flat, soft, with bowl sounds. Nontender, no guarding, rebound, hernias, masses, or organomegaly.  Lymphatics: Non tender without lymphadenopathy.  Genitourinary:  Musculoskeletal: Full ROM all peripheral extremities, joint stability, 5/5 strength, and normal gait. Skin: Warm and dry without rashes, lesions, cyanosis, clubbing or  ecchymosis.  Neuro: Cranial nerves intact, reflexes equal bilaterally. Normal muscle tone, no cerebellar symptoms. Sensation intact.  Pysch: Awake and oriented X 3, normal affect, Insight and Judgment appropriate.   Assessment and Plan  1. Annual Screening Examination 2. Hypertension/ ASHD / CABG 3. Hyperlipidemia 4. Pre Diabetes 5. Vitamin D Deficiency 6. DJD/Scoliosis/Fibromyalgia  Continue prudent diet as discussed, weight control, BP monitoring, regular exercise, and medications. Discussed med's effects and SE's. Screening labs and tests as requested with regular follow-up  as recommended.

## 2013-04-02 ENCOUNTER — Ambulatory Visit (INDEPENDENT_AMBULATORY_CARE_PROVIDER_SITE_OTHER): Payer: Medicare PPO | Admitting: Internal Medicine

## 2013-04-02 ENCOUNTER — Encounter: Payer: Self-pay | Admitting: Internal Medicine

## 2013-04-02 VITALS — BP 118/66 | HR 64 | Temp 98.1°F | Resp 16 | Ht 65.0 in | Wt 136.6 lb

## 2013-04-02 DIAGNOSIS — Z79899 Other long term (current) drug therapy: Secondary | ICD-10-CM

## 2013-04-02 DIAGNOSIS — D649 Anemia, unspecified: Secondary | ICD-10-CM

## 2013-04-02 DIAGNOSIS — E785 Hyperlipidemia, unspecified: Secondary | ICD-10-CM

## 2013-04-02 DIAGNOSIS — Z1211 Encounter for screening for malignant neoplasm of colon: Secondary | ICD-10-CM

## 2013-04-02 DIAGNOSIS — K219 Gastro-esophageal reflux disease without esophagitis: Secondary | ICD-10-CM

## 2013-04-02 DIAGNOSIS — N3000 Acute cystitis without hematuria: Secondary | ICD-10-CM

## 2013-04-02 DIAGNOSIS — E559 Vitamin D deficiency, unspecified: Secondary | ICD-10-CM

## 2013-04-02 DIAGNOSIS — I10 Essential (primary) hypertension: Secondary | ICD-10-CM

## 2013-04-02 DIAGNOSIS — R7303 Prediabetes: Secondary | ICD-10-CM

## 2013-04-02 DIAGNOSIS — Z0000 Encounter for general adult medical examination without abnormal findings: Secondary | ICD-10-CM

## 2013-04-02 LAB — IRON AND TIBC
%SAT: 45 % (ref 20–55)
Iron: 132 ug/dL (ref 42–145)
TIBC: 294 ug/dL (ref 250–470)
UIBC: 162 ug/dL (ref 125–400)

## 2013-04-02 LAB — CBC WITH DIFFERENTIAL/PLATELET
Basophils Absolute: 0 10*3/uL (ref 0.0–0.1)
Basophils Relative: 0 % (ref 0–1)
Eosinophils Absolute: 0.1 10*3/uL (ref 0.0–0.7)
Eosinophils Relative: 3 % (ref 0–5)
HCT: 38.8 % (ref 36.0–46.0)
Hemoglobin: 13.1 g/dL (ref 12.0–15.0)
Lymphocytes Relative: 20 % (ref 12–46)
Lymphs Abs: 1 10*3/uL (ref 0.7–4.0)
MCH: 29.9 pg (ref 26.0–34.0)
MCHC: 33.8 g/dL (ref 30.0–36.0)
MCV: 88.6 fL (ref 78.0–100.0)
Monocytes Absolute: 0.3 10*3/uL (ref 0.1–1.0)
Monocytes Relative: 6 % (ref 3–12)
Neutro Abs: 3.7 10*3/uL (ref 1.7–7.7)
Neutrophils Relative %: 71 % (ref 43–77)
Platelets: 208 10*3/uL (ref 150–400)
RBC: 4.38 MIL/uL (ref 3.87–5.11)
RDW: 13.3 % (ref 11.5–15.5)
WBC: 5.2 10*3/uL (ref 4.0–10.5)

## 2013-04-02 LAB — LIPID PANEL
Cholesterol: 155 mg/dL (ref 0–200)
HDL: 74 mg/dL (ref >39)
LDL Cholesterol: 68 mg/dL (ref 0–99)
Total CHOL/HDL Ratio: 2.1 Ratio
Triglycerides: 64 mg/dL (ref <150)
VLDL: 13 mg/dL (ref 0–40)

## 2013-04-02 LAB — BASIC METABOLIC PANEL WITH GFR
BUN: 17 mg/dL (ref 6–23)
CO2: 27 mEq/L (ref 19–32)
Calcium: 9.6 mg/dL (ref 8.4–10.5)
Chloride: 103 mEq/L (ref 96–112)
Creat: 0.71 mg/dL (ref 0.50–1.10)
GFR, Est African American: >89 mL/min
GFR, Est Non African American: 87 mL/min
Glucose, Bld: 92 mg/dL (ref 70–99)
Potassium: 4.1 mEq/L (ref 3.5–5.3)
Sodium: 140 mEq/L (ref 135–145)

## 2013-04-02 LAB — HEPATIC FUNCTION PANEL
ALT: 20 U/L (ref 0–35)
AST: 24 U/L (ref 0–37)
Albumin: 4.4 g/dL (ref 3.5–5.2)
Alkaline Phosphatase: 49 U/L (ref 39–117)
Bilirubin, Direct: 0.1 mg/dL (ref 0.0–0.3)
Indirect Bilirubin: 0.5 mg/dL (ref 0.2–1.2)
Total Bilirubin: 0.6 mg/dL (ref 0.2–1.2)
Total Protein: 6.4 g/dL (ref 6.0–8.3)

## 2013-04-02 LAB — HEMOGLOBIN A1C
Hgb A1c MFr Bld: 6 % — ABNORMAL HIGH (ref <5.7)
Mean Plasma Glucose: 126 mg/dL — ABNORMAL HIGH (ref <117)

## 2013-04-02 LAB — TSH: TSH: 2.837 u[IU]/mL (ref 0.350–4.500)

## 2013-04-02 LAB — VITAMIN B12: Vitamin B-12: 567 pg/mL (ref 211–911)

## 2013-04-02 LAB — MAGNESIUM: Magnesium: 2.1 mg/dL (ref 1.5–2.5)

## 2013-04-03 LAB — URINE CULTURE
Colony Count: NO GROWTH
Organism ID, Bacteria: NO GROWTH

## 2013-04-03 LAB — VITAMIN D 25 HYDROXY (VIT D DEFICIENCY, FRACTURES): Vit D, 25-Hydroxy: 88 ng/mL (ref 30–89)

## 2013-04-03 LAB — MICROALBUMIN / CREATININE URINE RATIO
Creatinine, Urine: 77 mg/dL
Microalb Creat Ratio: 6.5 mg/g (ref 0.0–30.0)
Microalb, Ur: 0.5 mg/dL (ref 0.00–1.89)

## 2013-04-03 LAB — URINALYSIS, MICROSCOPIC ONLY
Bacteria, UA: NONE SEEN
Casts: NONE SEEN
Crystals: NONE SEEN
Squamous Epithelial / LPF: NONE SEEN

## 2013-04-03 LAB — INSULIN, FASTING: Insulin fasting, serum: 12 u[IU]/mL (ref 3–28)

## 2013-05-16 ENCOUNTER — Other Ambulatory Visit: Payer: Self-pay | Admitting: Internal Medicine

## 2013-06-20 ENCOUNTER — Other Ambulatory Visit: Payer: Self-pay | Admitting: Internal Medicine

## 2013-07-04 ENCOUNTER — Ambulatory Visit: Payer: Medicare HMO | Admitting: Physician Assistant

## 2013-07-12 ENCOUNTER — Encounter: Payer: Self-pay | Admitting: Physician Assistant

## 2013-07-12 ENCOUNTER — Ambulatory Visit (INDEPENDENT_AMBULATORY_CARE_PROVIDER_SITE_OTHER): Payer: Medicare HMO | Admitting: Physician Assistant

## 2013-07-12 VITALS — BP 102/62 | HR 60 | Temp 97.9°F | Resp 16 | Wt 134.0 lb

## 2013-07-12 DIAGNOSIS — Z79899 Other long term (current) drug therapy: Secondary | ICD-10-CM

## 2013-07-12 DIAGNOSIS — D649 Anemia, unspecified: Secondary | ICD-10-CM

## 2013-07-12 DIAGNOSIS — E785 Hyperlipidemia, unspecified: Secondary | ICD-10-CM

## 2013-07-12 DIAGNOSIS — Z1331 Encounter for screening for depression: Secondary | ICD-10-CM

## 2013-07-12 DIAGNOSIS — K219 Gastro-esophageal reflux disease without esophagitis: Secondary | ICD-10-CM

## 2013-07-12 DIAGNOSIS — J449 Chronic obstructive pulmonary disease, unspecified: Secondary | ICD-10-CM

## 2013-07-12 DIAGNOSIS — R7303 Prediabetes: Secondary | ICD-10-CM

## 2013-07-12 DIAGNOSIS — Z0000 Encounter for general adult medical examination without abnormal findings: Secondary | ICD-10-CM

## 2013-07-12 DIAGNOSIS — I10 Essential (primary) hypertension: Secondary | ICD-10-CM

## 2013-07-12 DIAGNOSIS — E559 Vitamin D deficiency, unspecified: Secondary | ICD-10-CM

## 2013-07-12 DIAGNOSIS — Z789 Other specified health status: Secondary | ICD-10-CM

## 2013-07-12 LAB — LIPID PANEL
Cholesterol: 153 mg/dL (ref 0–200)
HDL: 67 mg/dL (ref >39)
LDL Cholesterol: 72 mg/dL (ref 0–99)
Total CHOL/HDL Ratio: 2.3 Ratio
Triglycerides: 69 mg/dL (ref <150)
VLDL: 14 mg/dL (ref 0–40)

## 2013-07-12 LAB — CBC WITH DIFFERENTIAL/PLATELET
Basophils Absolute: 0.1 10*3/uL (ref 0.0–0.1)
Basophils Relative: 1 % (ref 0–1)
Eosinophils Absolute: 0.1 10*3/uL (ref 0.0–0.7)
Eosinophils Relative: 2 % (ref 0–5)
HCT: 38.5 % (ref 36.0–46.0)
Hemoglobin: 13 g/dL (ref 12.0–15.0)
Lymphocytes Relative: 18 % (ref 12–46)
Lymphs Abs: 1 10*3/uL (ref 0.7–4.0)
MCH: 29.7 pg (ref 26.0–34.0)
MCHC: 33.8 g/dL (ref 30.0–36.0)
MCV: 88.1 fL (ref 78.0–100.0)
Monocytes Absolute: 0.4 10*3/uL (ref 0.1–1.0)
Monocytes Relative: 7 % (ref 3–12)
Neutro Abs: 3.8 10*3/uL (ref 1.7–7.7)
Neutrophils Relative %: 72 % (ref 43–77)
Platelets: 197 10*3/uL (ref 150–400)
RBC: 4.37 MIL/uL (ref 3.87–5.11)
RDW: 13.3 % (ref 11.5–15.5)
WBC: 5.3 10*3/uL (ref 4.0–10.5)

## 2013-07-12 LAB — HEPATIC FUNCTION PANEL
ALT: 20 U/L (ref 0–35)
AST: 22 U/L (ref 0–37)
Albumin: 4.5 g/dL (ref 3.5–5.2)
Alkaline Phosphatase: 52 U/L (ref 39–117)
Bilirubin, Direct: 0.2 mg/dL (ref 0.0–0.3)
Indirect Bilirubin: 0.5 mg/dL (ref 0.2–1.2)
Total Bilirubin: 0.7 mg/dL (ref 0.2–1.2)
Total Protein: 6.4 g/dL (ref 6.0–8.3)

## 2013-07-12 LAB — HEMOGLOBIN A1C
Hgb A1c MFr Bld: 5.8 % — ABNORMAL HIGH (ref <5.7)
Mean Plasma Glucose: 120 mg/dL — ABNORMAL HIGH (ref <117)

## 2013-07-12 LAB — MAGNESIUM: Magnesium: 2.2 mg/dL (ref 1.5–2.5)

## 2013-07-12 LAB — BASIC METABOLIC PANEL WITH GFR
BUN: 17 mg/dL (ref 6–23)
CO2: 25 mEq/L (ref 19–32)
Calcium: 9.4 mg/dL (ref 8.4–10.5)
Chloride: 103 mEq/L (ref 96–112)
Creat: 0.68 mg/dL (ref 0.50–1.10)
GFR, Est African American: >89 mL/min
GFR, Est Non African American: 89 mL/min
Glucose, Bld: 102 mg/dL — ABNORMAL HIGH (ref 70–99)
Potassium: 3.9 mEq/L (ref 3.5–5.3)
Sodium: 136 mEq/L (ref 135–145)

## 2013-07-12 LAB — TSH: TSH: 2.618 u[IU]/mL (ref 0.350–4.500)

## 2013-07-12 NOTE — Progress Notes (Signed)
Assessment:   1. HYPERTENSION - CBC with Differential - BASIC METABOLIC PANEL WITH GFR - Hepatic function panel - TSH  2. COPD Controlled, will get CXR next visit.   3. GERD (gastroesophageal reflux disease) Controlled with protonix- continue in the AM 30 mins before food  4. HYPERLIPIDEMIA - Lipid panel  5. Unspecified vitamin D deficiency - Vit D  25 hydroxy (rtn osteoporosis monitoring)  6. Anemia Check CBC  7. Encounter for long-term (current) use of other medications - Magnesium  8. Prediabetes Discussed general issues about diabetes pathophysiology and management., Educational material distributed., Suggested low cholesterol diet., Encouraged aerobic exercise., Discussed foot care., Reminded to get yearly retinal exam. - Hemoglobin A1c - HM DIABETES FOOT EXAM  9. Right shoulder bursitis- -offered injection here, she will cont tumeric, RICE, stretches and if not better follow up here or ortho    Plan:   During the course of the visit the patient was educated and counseled about appropriate screening and preventive services including:    Pneumococcal vaccine   Influenza vaccine  Td vaccine  Screening electrocardiogram  Screening mammography  Bone densitometry screening  Colorectal cancer screening  Diabetes screening  Glaucoma screening  Nutrition counseling   Advanced directives: given information  Screening recommendations, referrals:  Vaccinations: Tdap vaccine not indicated Influenza vaccine not indicated Pneumococcal vaccine not indicated Shingles vaccine not indicated Hep B vaccine not indicated  Nutrition assessed and recommended  Colonoscopy due 2024 Mammogram requested Pap smear not indicated Pelvic exam not indicated Recommended yearly ophthalmology/optometry visit for glaucoma screening and checkup Recommended yearly dental visit for hygiene and checkup Advanced directives - requested  Conditions/risks identified: BMI:  Discussed weight loss, diet, and increase physical activity.  Increase physical activity: AHA recommends 150 minutes of physical activity a week.  Medications reviewed DEXA- due 2016 Diabetes is at goal, ACE/ARB therapy: Yes. Urinary Incontinence is not an issue: discussed non pharmacology and pharmacology options.  Fall risk: low- discussed PT, home fall assessment, medications.    Subjective:   Jasmine Reeves is a 73 y.o. female who presents for Medicare Annual Wellness Visit and 3 month follow up on hypertension, prediabetes, hyperlipidemia, vitamin D def.  Date of last medicare wellness visit is unknown.   Her blood pressure has been controlled at home, today their BP is BP: 102/62 mmHg She does workout. She denies chest pain, shortness of breath, dizziness.  She is on cholesterol medication and denies myalgias. Her cholesterol is at goal. The cholesterol last visit was:   Lab Results  Component Value Date   CHOL 155 04/02/2013   HDL 74 04/02/2013   LDLCALC 68 04/02/2013   TRIG 64 04/02/2013   CHOLHDL 2.1 04/02/2013   She has been working on diet and exercise for prediabetes, and denies paresthesia of the feet, polydipsia and polyuria. Last A1C in the office was:  Lab Results  Component Value Date   HGBA1C 6.0* 04/02/2013   Patient is on Vitamin D supplement. Right shoulder pain.   Names of Other Physician/Practitioners you currently use: 1. [facility]- here for primary care 2. [facility], eye doctor, last visit 10/2012 3.  dentist, yearly 4. Dr. Matthias Hughs, GI Patient Care Team: [name], MD as PCP - General (Internal Medicine)  Medication Review Current Outpatient Prescriptions on File Prior to Visit  Medication Sig Dispense Refill  . aspirin 81 MG tablet Take 81 mg by mouth daily.      . Calcium Carb-Cholecalciferol (CALCIUM 1000 + D  PO) Take 1 tablet by mouth daily.      . Cholecalciferol (VITAMIN D) 2000 UNITS tablet Take 2,000 Units by  mouth 2 (two) times daily.      Marland Kitchen. diltiazem (DILACOR XR) 180 MG 24 hr capsule TAKE 1 CAPSULE BY MOUTH EVERY DAY  90 capsule  0  . ezetimibe-simvastatin (VYTORIN) 10-20 MG per tablet Take 1 tablet by mouth daily.      . fexofenadine (ALLEGRA) 180 MG tablet Take 180 mg by mouth daily.      . fluticasone (VERAMYST) 27.5 MCG/SPRAY nasal spray Place 2 sprays into the nose daily as needed for rhinitis.  10 g  8  . hydrochlorothiazide (HYDRODIURIL) 25 MG tablet TAKE 1 TABLET BY MOUTH DAILY FOR FLUID  90 tablet  0  . labetalol (NORMODYNE) 100 MG tablet Take 100 mg by mouth. 1.5 pills  = 150 mg BID      . losartan (COZAAR) 50 MG tablet TAKE 1/2 TO 1 TABLET BY MOUTH EVERY DAY FOR BLOOD PRESSURE  90 tablet  0  . Magnesium 400 MG CAPS Take by mouth daily.      . Multiple Vitamins-Minerals (MULTIVITAMIN WITH MINERALS) tablet Take 1 tablet by mouth daily.      . nitroGLYCERIN (NITROSTAT) 0.4 MG SL tablet Place 1 tablet (0.4 mg total) under the tongue every 5 (five) minutes as needed for chest pain.  25 tablet  12  . pantoprazole (PROTONIX) 40 MG tablet TAKE 1 TABLET BY MOUTH TWICE A DAY 1/2 HOUR BEFORE BREAKFAST AND DINNER  180 tablet  2  . TURMERIC PO Take 900 mg by mouth daily.       . vitamin C (ASCORBIC ACID) 500 MG tablet Take 500 mg by mouth daily.       No current facility-administered medications on file prior to visit.    Current Problems (verified) Patient Active Problem List   Diagnosis Date Noted  . CAD (coronary artery disease) 02/03/2013  . MVP (mitral valve prolapse) 02/03/2013  . Unspecified vitamin D deficiency   . GERD (gastroesophageal reflux disease)   . Anemia   . COPD 02/11/2010  . HYPERLIPIDEMIA 01/29/2010  . HYPERTENSION 01/28/2010  . RAYNAUDS SYNDROME 01/28/2010  . DVT 01/28/2010    Screening Tests Health Maintenance  Topic Date Due  . Mammogram  10/09/1992  . Influenza Vaccine  09/29/2013  . Tetanus/tdap  03/27/2022  . Colonoscopy  07/22/2022  . Pneumococcal  Polysaccharide Vaccine Age 71 And Over  Completed  . Zostavax  Completed     Immunization History  Administered Date(s) Administered  . Pneumococcal-Unspecified 03/01/1998, 03/12/2010  . Td 03/27/2012  . Zoster 03/02/2007    Preventative care: Last colonoscopy: 06/2012 Last mammogram: 05/2013 Last pap smear/pelvic exam: 2010 (Dr. Jennette KettleNeal)  DEXA: 03/2012  Prior vaccinations: TD or Tdap: 2014  Influenza: 2014 Pneumococcal: 2012 Shingles/Zostavax: 2009  History reviewed: allergies, current medications, past family history, past medical history, past social history, past surgical history and problem list  Risk Factors: Osteoporosis: postmenopausal estrogen deficiency and dietary calcium and/or vitamin D deficiency History of fracture in the past year: no  Tobacco History  Substance Use Topics  . Smoking status: Former Smoker    Start date: 02/01/1963  . Smokeless tobacco: Not on file  . Alcohol Use: 1.5 oz/week    3 drink(s) per week     Comment: rare   She does not smoke.  Patient is a former smoker. Are there smokers in your home (other than you)?  No  Alcohol Current alcohol use: social drinker  Caffeine Current caffeine use: coffee 1-2 /day  Exercise Exercise limitations: The patient has no exercise limitations. Current exercise: housecleaning and walking  Nutrition/Diet Current diet: in general, a "healthy" diet    Cardiac risk factors: advanced age (older than 75 for men, 15 for women), diabetes mellitus, dyslipidemia, hypertension and sedentary lifestyle.  Depression Screen (Note: if answer to either of the following is "Yes", a more complete depression screening is indicated)   Q1: Over the past two weeks, have you felt down, depressed or hopeless? No  Q2: Over the past two weeks, have you felt little interest or pleasure in doing things? No  Have you lost interest or pleasure in daily life? No  Do you often feel hopeless? No  Do you cry easily over  simple problems? No  Activities of Daily Living In your present state of health, do you have any difficulty performing the following activities?:  Driving? No Managing money?  No Feeding yourself? No Getting from bed to chair? No Climbing a flight of stairs? No Preparing food and eating?: No Bathing or showering? No Getting dressed: No Getting to the toilet? No Using the toilet:No Moving around from place to place: No In the past year have you fallen or had a near fall?:No   Are you sexually active?  No  Do you have more than one partner?  No  Vision Difficulties: No  Hearing Difficulties: No Do you often ask people to speak up or repeat themselves? No Do you experience ringing or noises in your ears? No Do you have difficulty understanding soft or whispered voices? No  Cognition  Do you feel that you have a problem with memory?No  Do you often misplace items? No  Do you feel safe at home?  Yes  Advanced directives Does patient have a Health Care Power of Attorney? No Does patient have a Living Will? No   Objective:   Blood pressure 102/62, pulse 60, temperature 97.9 F (36.6 C), resp. rate 16, weight 134 lb (60.782 kg). Body mass index is 22.3 kg/(m^2).  General appearance: alert, no distress, WD/WN,  female Cognitive Testing  Alert? Yes  Normal Appearance?Yes  Oriented to person? Yes  Place? Yes   Time? Yes  Recall of three objects?  Yes  Can perform simple calculations? Yes  Displays appropriate judgment?Yes  Can read the correct time from a watch face?Yes  HEENT: normocephalic, sclerae anicteric, TMs pearly, nares patent, no discharge or erythema, pharynx normal Oral cavity: MMM, no lesions Neck: supple, no lymphadenopathy, no thyromegaly, no masses Heart: RRR, normal S1, S2, no murmurs Lungs: CTA bilaterally, no wheezes, rhonchi, or rales Abdomen: +bs, soft, non tender, non distended, no masses, no hepatomegaly, no splenomegaly Musculoskeletal: right  subacromial bursa tenderness, full ROM, no swelling, no obvious deformity Extremities: no edema, no cyanosis, no clubbing Pulses: 2+ symmetric, upper and lower extremities, normal cap refill Neurological: alert, oriented x 3, CN2-12 intact, strength normal upper extremities and lower extremities, sensation normal throughout, DTRs 2+ throughout, no cerebellar signs, gait normal Psychiatric: normal affect, behavior normal, pleasant  Breast: defer Gyn: defer Rectal: defer  Medicare Attestation I have personally reviewed: The patient's medical and social history Their use of alcohol, tobacco or illicit drugs Their current medications and supplements The patient's functional ability including ADLs,fall risks, home safety risks, cognitive, and hearing and visual impairment Diet and physical activities Evidence for depression or mood disorders  The patient's weight, height, BMI,  and visual acuity have been recorded in the chart.  I have made referrals, counseling, and provided education to the patient based on review of the above and I have provided the patient with a written personalized care plan for preventive services.     Quentin Mullingmanda Tykia Mellone, PA-C   07/12/2013

## 2013-07-12 NOTE — Patient Instructions (Signed)

## 2013-07-13 LAB — VITAMIN D 25 HYDROXY (VIT D DEFICIENCY, FRACTURES): Vit D, 25-Hydroxy: 76 ng/mL (ref 30–89)

## 2013-07-20 ENCOUNTER — Encounter: Payer: Self-pay | Admitting: Physician Assistant

## 2013-07-20 ENCOUNTER — Ambulatory Visit (INDEPENDENT_AMBULATORY_CARE_PROVIDER_SITE_OTHER): Payer: Medicare PPO | Admitting: Physician Assistant

## 2013-07-20 VITALS — BP 110/62 | HR 56 | Temp 98.1°F | Resp 16 | Wt 137.0 lb

## 2013-07-20 DIAGNOSIS — B029 Zoster without complications: Secondary | ICD-10-CM

## 2013-07-20 MED ORDER — PREDNISONE 20 MG PO TABS: ORAL_TABLET | ORAL | Status: DC

## 2013-07-20 MED ORDER — ACYCLOVIR 400 MG PO TABS: ORAL_TABLET | ORAL | Status: DC

## 2013-07-20 NOTE — Patient Instructions (Signed)
Shingles Shingles (herpes zoster) is an infection that is caused by the same virus that causes chickenpox (varicella). The infection causes a painful skin rash and fluid-filled blisters, which eventually break open, crust over, and heal. It may occur in any area of the body, but it usually affects only one side of the body or face. The pain of shingles usually lasts about 1 month. However, some people with shingles may develop long-term (chronic) pain in the affected area of the body. Shingles often occurs many years after the person had chickenpox. It is more common:  In people older than 50 years.  In people with weakened immune systems, such as those with HIV, AIDS, or cancer.  In people taking medicines that weaken the immune system, such as transplant medicines.  In people under great stress. CAUSES  Shingles is caused by the varicella zoster virus (VZV), which also causes chickenpox. After a person is infected with the virus, it can remain in the person's body for years in an inactive state (dormant). To cause shingles, the virus reactivates and breaks out as an infection in a nerve root. The virus can be spread from person to person (contagious) through contact with open blisters of the shingles rash. It will only spread to people who have not had chickenpox. When these people are exposed to the virus, they may develop chickenpox. They will not develop shingles. Once the blisters scab over, the person is no longer contagious and cannot spread the virus to others. SYMPTOMS  Shingles shows up in stages. The initial symptoms may be pain, itching, and tingling in an area of the skin. This pain is usually described as burning, stabbing, or throbbing.In a few days or weeks, a painful red rash will appear in the area where the pain, itching, and tingling were felt. The rash is usually on one side of the body in a band or belt-like pattern. Then, the rash usually turns into fluid-filled blisters. They  will scab over and dry up in approximately 2 3 weeks. Flu-like symptoms may also occur with the initial symptoms, the rash, or the blisters. These may include:  Fever.  Chills.  Headache.  Upset stomach. DIAGNOSIS  Your caregiver will perform a skin exam to diagnose shingles. Skin scrapings or fluid samples may also be taken from the blisters. This sample will be examined under a microscope or sent to a lab for further testing. TREATMENT  There is no specific cure for shingles. Your caregiver will likely prescribe medicines to help you manage the pain, recover faster, and avoid long-term problems. This may include antiviral drugs, anti-inflammatory drugs, and pain medicines. HOME CARE INSTRUCTIONS   Take a cool bath or apply cool compresses to the area of the rash or blisters as directed. This may help with the pain and itching.   Only take over-the-counter or prescription medicines as directed by your caregiver.   Rest as directed by your caregiver.  Keep your rash and blisters clean with mild soap and cool water or as directed by your caregiver.  Do not pick your blisters or scratch your rash. Apply an anti-itch cream or numbing creams to the affected area as directed by your caregiver.  Keep your shingles rash covered with a loose bandage (dressing).  Avoid skin contact with:  Babies.   Pregnant women.   Children with eczema.   Elderly people with transplants.   People with chronic illnesses, such as leukemia or AIDS.   Wear loose-fitting clothing to help ease   the pain of material rubbing against the rash.  Keep all follow-up appointments with your caregiver.If the area involved is on your face, you may receive a referral for follow-up to a specialist, such as an eye doctor (ophthalmologist) or an ear, nose, and throat (ENT) doctor. Keeping all follow-up appointments will help you avoid eye complications, chronic pain, or disability.  SEEK IMMEDIATE MEDICAL  CARE IF:   You have facial pain, pain around the eye area, or loss of feeling on one side of your face.  You have ear pain or ringing in your ear.  You have loss of taste.  Your pain is not relieved with prescribed medicines.   Your redness or swelling spreads.   You have more pain and swelling.  Your condition is worsening or has changed.   You have a feveror persistent symptoms for more than 2 3 days.  You have a fever and your symptoms suddenly get worse. MAKE SURE YOU:  Understand these instructions.  Will watch your condition.  Will get help right away if you are not doing well or get worse. Document Released: 02/15/2005 Document Revised: 11/10/2011 Document Reviewed: 09/30/2011 [employer] Patient Information 2014 [employer].  

## 2013-07-20 NOTE — Progress Notes (Signed)
   Subjective:    Patient ID: Jasmine Reeves, female    DOB: [birth year], 73 y.o.   MRN: [id number]  HPI 73 y.o. female presents for a rash that started Monday. She states that on her left lower back and left abdomen she started to have some itching, bumps. She has had the shingles vaccine.    Review of Systems  Constitutional: Negative.   HENT: Negative.   Respiratory: Negative.   Cardiovascular: Negative.   Gastrointestinal: Negative.   Genitourinary: Negative.   Musculoskeletal: Positive for back pain and neck pain.  Skin: Positive for rash.       Objective:   Physical Exam  Constitutional: She appears well-developed and well-nourished.  HENT:  Head: Normocephalic and atraumatic.  Eyes: Conjunctivae are normal. Pupils are equal, round, and reactive to light.  Neck: Normal range of motion. Neck supple.  Cardiovascular: Normal rate and regular rhythm.   Pulmonary/Chest: Effort normal and breath sounds normal.  Abdominal: Soft. Bowel sounds are normal.  Neurological: She is alert.  Skin: Skin is warm and dry. Rash (left sides vesicular rash along T9-T8 2 vesicles on the ab and 6-8 clustered vesicles on her back with erythema. ) noted.      Assessment & Plan:  Shingles- no pain likely from having the shingles shot in the past- will do acyclovir and prednisone.

## 2013-07-23 ENCOUNTER — Other Ambulatory Visit: Payer: Self-pay | Admitting: Physician Assistant

## 2013-08-29 ENCOUNTER — Other Ambulatory Visit: Payer: Self-pay | Admitting: Internal Medicine

## 2013-09-07 ENCOUNTER — Ambulatory Visit (HOSPITAL_COMMUNITY)
Admission: RE | Admit: 2013-09-07 | Discharge: 2013-09-07 | Disposition: A | Discharge status: Home / Self Care | Payer: Medicare HMO | Source: Ambulatory Visit | Attending: Cardiovascular Disease | Admitting: Cardiovascular Disease

## 2013-09-07 VITALS — Ht 66.0 in | Wt 137.0 lb

## 2013-09-07 DIAGNOSIS — I2510 Atherosclerotic heart disease of native coronary artery without angina pectoris: Secondary | ICD-10-CM | POA: Insufficient documentation

## 2013-09-07 MED ORDER — TECHNETIUM TC 99M SESTAMIBI GENERIC - CARDIOLITE
10.5000 | Freq: Once | INTRAVENOUS | Status: AC | PRN
  Administered 2013-09-07: 11 via INTRAVENOUS

## 2013-09-07 MED ORDER — TECHNETIUM TC 99M SESTAMIBI GENERIC - CARDIOLITE
29.6000 | Freq: Once | INTRAVENOUS | Status: AC | PRN
  Administered 2013-09-07: 30 via INTRAVENOUS

## 2013-09-07 NOTE — Procedures (Addendum)
[facility] [address] [place] [address] [phone]  Cardiology Nuclear Med Study  Jasmine StacksWilma Jani GravelM Reeves is a 73 y.o. female     MRN : [id number]     DOB: January 05, 1941  Procedure Date: 09/07/2013  Nuclear Med Background Indication for Stress Test:  Graft Patency History:  Asthma, COPD, MVP and CAD-cath in 2008;CABG-X4-03/02/2000;ASHD;PC's on Holter monitor;Last NUC MPI on 02/26/2011-nonischemic;EF=70% Cardiac Risk Factors: Family History - CAD, History of Smoking, Hypertension and Lipids  Symptoms:  Dizziness, Light-Headedness, SOB and back and neck pain   Nuclear Pre-Procedure Caffeine/Decaff Intake:  1:00am NPO After: 11am   IV Site: R Forearm  IV 0.9% NS with Angio Cath:  22g  Chest Size (in):  n/a IV Started by: Jasmine OgrenAmanda Wease, RN  Height: 5\' 6"  (1.676 Jasmine)  Cup Size: C  BMI:  Body mass index is 22.12 kg/(Jasmine^2). Weight:  137 lb (62.143 kg)   Tech Comments:  n/a    Nuclear Med Study 1 or 2 day study: 1 day  Stress Test Type:  Stress  Order Authorizing Provider:  Nicki Guadalajarahomas Kelly, MD   Resting Radionuclide: Technetium 6184m Sestamibi  Resting Radionuclide Dose: 10.5 mCi   Stress Radionuclide:  Technetium 1984m Sestamibi  Stress Radionuclide Dose: 29.6 mCi           Stress Protocol Rest HR: 60 Stress HR: 146  Rest BP: 178/91 Stress BP: 131/108  Exercise Time (min): 7 METS: 7.0   Predicted Max HR: 150 bpm % Max HR: 97.33 bpm Rate Pressure Product: [id number]  Dose of Adenosine (mg):  n/a Dose of Lexiscan: n/a mg  Dose of Atropine (mg): n/a Dose of Dobutamine: n/a mcg/kg/min (at max HR)  Stress Test Technologist: Jasmine Reeves, CCT Nuclear Technologist: Jasmine Reeves, CNMT   Rest Procedure:  Myocardial perfusion imaging was performed at rest 45 minutes following the intravenous administration of Technetium 6184m Sestamibi. Stress Procedure:  The patient performed treadmill exercise using a Bruce  Protocol for 7 minutes. The  patient stopped due to SOB and denied any chest pain.  There were no significant ST-T wave changes.  Technetium 284m Sestamibi was injected IV at peak exercise and myocardial perfusion imaging was performed after a brief delay.  Transient Ischemic Dilatation (Normal <1.22):  0.97  QGS EDV:  65 ml QGS ESV:  23 ml LV Ejection Fraction: 64%        Rest ECG: NSR - Normal EKG  Stress ECG: Significant ST abnormalities consistent with ischemia.  QPS Raw Data Images:  Normal; no motion artifact; normal heart/lung ratio. Stress Images:  There is decreased uptake in the anterior wall. Rest Images:  There is decreased uptake in the anterior wall. Subtraction (SDS):  There is a fixed anteriour defect that is most consistent with breast attenuation.  Impression Exercise Capacity:  Good exercise capacity. BP Response:  Normal blood pressure response. Clinical Symptoms:  No significant symptoms noted. ECG Impression:  Significant ST abnormalities consistent with ischemia. Comparison with Prior Nuclear Study: No significant change from previous study  Overall Impression:  Low risk stress nuclear study Apical thinning and breast attenuation artifact.  LV Wall Motion:  Normal Wall Motion   Jasmine Reeves,Jasmine Klemens J, MD  09/07/2013 6:22 PM

## 2013-10-02 ENCOUNTER — Telehealth: Payer: Self-pay | Admitting: Cardiovascular Disease

## 2013-10-03 ENCOUNTER — Encounter: Payer: Self-pay | Admitting: Cardiovascular Disease

## 2013-10-03 ENCOUNTER — Ambulatory Visit (INDEPENDENT_AMBULATORY_CARE_PROVIDER_SITE_OTHER): Payer: Medicare HMO | Admitting: Cardiovascular Disease

## 2013-10-03 VITALS — BP 115/70 | HR 55 | Ht 63.5 in | Wt 139.2 lb

## 2013-10-03 DIAGNOSIS — E785 Hyperlipidemia, unspecified: Secondary | ICD-10-CM

## 2013-10-03 DIAGNOSIS — I341 Nonrheumatic mitral (valve) prolapse: Secondary | ICD-10-CM

## 2013-10-03 DIAGNOSIS — I059 Rheumatic mitral valve disease, unspecified: Secondary | ICD-10-CM

## 2013-10-03 DIAGNOSIS — I10 Essential (primary) hypertension: Secondary | ICD-10-CM

## 2013-10-03 DIAGNOSIS — K219 Gastro-esophageal reflux disease without esophagitis: Secondary | ICD-10-CM

## 2013-10-03 DIAGNOSIS — I2510 Atherosclerotic heart disease of native coronary artery without angina pectoris: Secondary | ICD-10-CM

## 2013-10-03 DIAGNOSIS — I7300 Raynaud's syndrome without gangrene: Secondary | ICD-10-CM

## 2013-10-03 NOTE — Patient Instructions (Signed)
Your physician recommends that you schedule a follow-up appointment in: 1 year. No changes was made today in your therapy.

## 2013-10-03 NOTE — Progress Notes (Signed)
Patient ID: Jasmine Reeves, female   DOB: [birth year], 73 y.o.   MRN: [id number]     HPI: Jasmine Reeves is a 73 y.o. female presents for 1 year cardiology evaluation.  [name] underwent CABG revascularization surgery x4 on 03/02/2000 by Dr. Roxy Manns with a LIMA placed her distal LAD, vein to the second diagonal, sequential vein to 2 marginal vessels arising from the circumflex coronary artery. Her RCA was free of disease and was not bypassed. Additional problems include mitral valve prolapse, Raynaud's disease, history of short bursts of nonsustained tachycardia on treadmill testing with reference to this on beta blocker therapy.,. She did develop edema remotely on amlodipine. She does have a history of documented APCs on Holter imaging which have improved with labetalol.  Over the past year, she is continued to be stable. She denies chest tightness. She is unaware of tachypalpitations.   She underwent a nuclear perfusion study on 09/07/2013.  She was noted to have ST segment changes  on ECG but nuclear imaging showed apical thinning with breast attenuation, without ischemia.  She had normal wall motion.  She denies any awareness of palpitations.  She does have issues with some arthritic symptoms.  She takes diltiazem 180 mg in addition to losartan 25 mg, Normodyne 150 mg twice a day and HCTZ 25 mg.  She is on Vytorin 10/20 for hyperlipidemia.  I did review her recent blood work.  Hemoglobin 13, hematocrit 38.5.  Renal function was excellent with a creatinine of 0.68.  LFTs were normal.  Lipid studies were excellent with a total cholesterol 153, triglycerides 69, HDL 67, and LDL cholesterol 72.  TSH was normal.  Fasting glucose was 102 and her hemoglobin A1c was 5.8.   Past Medical History  Diagnosis Date  . Hypertension   . Hyperlipidemia   . GERD (gastroesophageal reflux disease)   . COPD (chronic obstructive pulmonary disease)   . Anemia   . Fibromyalgia   . Unspecified vitamin D deficiency    . Raynaud disease   . Scoliosis   . IBS (irritable bowel syndrome)   . ASHD (arteriosclerotic heart disease)     Past Surgical History  Procedure Laterality Date  . Tonsillectomy    . Appendectomy    . Abdominal hysterectomy    . Tubal ligation    . Coronary artery bypass graft    . Coronary angioplasty  12/11  . Esophagogastroduodenoscopy  07/21/12    polyps, Hiatal hernia Dr. Cristina Gong    Allergies  Allergen Reactions  . Altace [Ramipril]     cough  . Azithromycin     palpitation  . Celebrex [Celecoxib]     Gi upset   . Hydrochlorothiazide     cramping  . 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 Prilosec [Omeprazole]     constipation    Current Outpatient Prescriptions  Medication Sig Dispense Refill  . aspirin 81 MG tablet Take 81 mg by mouth daily.      . Calcium Carb-Cholecalciferol (CALCIUM 1000 + D PO) Take 1 tablet by mouth daily.      . Cholecalciferol (VITAMIN D) 2000 UNITS tablet Take 2,000 Units by mouth 2 (two) times daily.      Marland Kitchen diltiazem (DILACOR XR) 180 MG 24 hr capsule TAKE 1 CAPSULE BY MOUTH EVERY DAY  90 capsule  1  . ezetimibe-simvastatin (VYTORIN) 10-20 MG per tablet Take 1 tablet by mouth daily.      Marland Kitchen  fexofenadine (ALLEGRA) 180 MG tablet Take 180 mg by mouth daily.      . fluticasone (VERAMYST) 27.5 MCG/SPRAY nasal spray Place 2 sprays into the nose daily as needed for rhinitis.  10 g  8  . hydrochlorothiazide (HYDRODIURIL) 25 MG tablet TAKE 1 TABLET BY MOUTH EVERY DAY FOR FLUID  90 tablet  1  . IRON PO Take 50 mg by mouth daily.      Marland Kitchen labetalol (NORMODYNE) 100 MG tablet TAKE 1 1/2 TABLET BY MOUTH TWICE DAILY  270 tablet  0  . losartan (COZAAR) 50 MG tablet TAKE 1/2 Tablet daily      . Magnesium 400 MG CAPS Take by mouth daily.      . Multiple Vitamins-Minerals (MULTIVITAMIN WITH MINERALS) tablet Take 1 tablet by mouth daily.      . nitroGLYCERIN (NITROSTAT) 0.4 MG SL tablet Place 1 tablet (0.4 mg total) under the  tongue every 5 (five) minutes as needed for chest pain.  25 tablet  12  . pantoprazole (PROTONIX) 40 MG tablet TAKE 1 TABLET BY MOUTH TWICE A DAY 1/2 HOUR BEFORE BREAKFAST AND DINNER  180 tablet  2  . TURMERIC PO Take 900 mg by mouth daily.       . vitamin C (ASCORBIC ACID) 500 MG tablet Take 500 mg by mouth daily.       No current facility-administered medications for this visit.    History   Social History  . Marital Status: Married    Spouse Name: N/A    Number of Children: N/A  . Years of Education: N/A   Occupational History  . Not on file.   Social History Main Topics  . Smoking status: Former Smoker    Start date: 02/01/1963  . Smokeless tobacco: Not on file  . Alcohol Use: 1.5 oz/week    3 drink(s) per week     Comment: rare  . Drug Use: No  . Sexual Activity: Not on file   Other Topics Concern  . Not on file   Social History Narrative  . No narrative on file   Socially she is married has 2 children 3 grandchildren. She does not routinely exercise. There is no tobacco use. She does drink occasional alcohol. She is retired Systems analyst.  Family History  Problem Relation Age of Onset  . Heart disease Brother   . Hyperlipidemia Brother    ROS General: Negative; No fevers, chills, or night sweats;  HEENT: Negative; No changes in vision or hearing, sinus congestion, difficulty swallowing Pulmonary: Negative; No cough, wheezing, shortness of breath, hemoptysis Cardiovascular: Negative; No chest pain, presyncope, syncope, palpatations GI: Negative; No nausea, vomiting, diarrhea, or abdominal pain GU: Negative; No dysuria, hematuria, or difficulty voiding Musculoskeletal: Positive for a Raynaud's; no myalgias, joint pain, or weakness Hematologic/Oncology: Negative; no easy bruising, bleeding Endocrine: Negative; no heat/cold intolerance; no diabetes Neuro: Negative; no changes in balance, headaches Skin: Negative; No rashes or skin  lesions Psychiatric: Negative; No behavioral problems, depression Sleep: Negative; No snoring, daytime sleepiness, hypersomnolence, bruxism, restless legs, hypnogognic hallucinations, no cataplexy Other comprehensive 14 point system review is negative.   PE BP 115/70  Pulse 55  Ht 5' 3.5" (1.613 m)  Wt 139 lb 3.2 oz (63.141 kg)  BMI 24.27 kg/m2  General: Alert, oriented, no distress.  Skin: normal turgor, no rashes HEENT: Normocephalic, atraumatic. Pupils round and reactive; sclera anicteric;no lid lag.  Nose without nasal septal hypertrophy Mouth/Parynx benign; Mallinpatti scale 2 Neck: No  JVD, no carotid bruits with normal carotid upstroke Lungs: clear to ausculatation and percussion; no wheezing or rales Chest wall: Nontender to palpation Heart: RRR, s1 s2 normal a 1/6 systolic murmur, soft systolic click.  No S3 or S4 gallop.  No diastolic murmur, rubs, thrills or heaves Abdomen: soft, nontender; no hepatosplenomehaly, BS+; abdominal aorta nontender and not dilated by palpation. Back: No CVA tenderness Pulses 2+ Extremities: no clubbing cyanosis or edema, Homan's sign negative  Neurologic: grossly nonfocal Psychologic: normal affect and mood.   Prior ECG: Sinus rhythm at 51 beats per minute. Non-specific T changes.  LABS:  BMET    Component Value Date/Time   NA 136 07/12/2013 1034   K 3.9 07/12/2013 1034   CL 103 07/12/2013 1034   CO2 25 07/12/2013 1034   GLUCOSE 102* 07/12/2013 1034   BUN 17 07/12/2013 1034   CREATININE 0.68 07/12/2013 1034   CREATININE .7 08/27/2009 1732   CALCIUM 9.4 07/12/2013 1034   GFRNONAA 89 07/12/2013 1034   GFRNONAA >60 08/27/2009 1732   GFRAA >89 07/12/2013 1034   GFRAA  Value: >60        The eGFR has been calculated using the MDRD equation. This calculation has not been validated in all clinical situations. eGFR's persistently <60 mL/min signify possible Chronic Kidney Disease. 08/27/2009 1732     Hepatic Function Panel     Component Value  Date/Time   PROT 6.4 07/12/2013 1034   ALBUMIN 4.5 07/12/2013 1034   AST 22 07/12/2013 1034   ALT 20 07/12/2013 1034   ALKPHOS 52 07/12/2013 1034   BILITOT 0.7 07/12/2013 1034   BILIDIR 0.2 07/12/2013 1034   IBILI 0.5 07/12/2013 1034     CBC    Component Value Date/Time   WBC 5.3 07/12/2013 1034   RBC 4.37 07/12/2013 1034   HGB 13.0 07/12/2013 1034   HCT 38.5 07/12/2013 1034   PLT 197 07/12/2013 1034   MCV 88.1 07/12/2013 1034   MCH 29.7 07/12/2013 1034   MCHC 33.8 07/12/2013 1034   RDW 13.3 07/12/2013 1034   LYMPHSABS 1.0 07/12/2013 1034   MONOABS 0.4 07/12/2013 1034   EOSABS 0.1 07/12/2013 1034   BASOSABS 0.1 07/12/2013 1034     BNP No results found for this basename: probnp    Lipid Panel     Component Value Date/Time   CHOL 153 07/12/2013 1034   TRIG 69 07/12/2013 1034   HDL 67 07/12/2013 1034   CHOLHDL 2.3 07/12/2013 1034   VLDL 14 07/12/2013 1034   LDLCALC 72 07/12/2013 1034     RADIOLOGY: No results found.    ASSESSMENT AND PLAN: Ms. Karra Pink is now 73 years old. Cardiac catheterization on New Year's Eve 2001 showed severe two-vessel coronary artery disease for which she underwent CABG revascularization surgery on 03/02/2000. Her last catheterization was in 2008 which showed patent grafts. She has normal LV function and mitral valve prolapse. She is not having ectopy on low-dose beta blocker therapy.  Her most recent nuclear perfusion study was unchanged and continued to show normal perfusion without scar or ischemia.  She did have exercise-induced ST changes.  I reviewed her laboratory in detail.  Her lipids are excellent.  Her blood pressure today is well controlled.  As long as she remains stable I will see her in one year for cardiology reevaluation or sooner if problems arise.    Troy Sine, MD, [facility]  10/03/2013 3:23 PM

## 2013-10-04 NOTE — Telephone Encounter (Signed)
Closed encounter °

## 2013-10-08 ENCOUNTER — Telehealth: Payer: Self-pay | Admitting: *Deleted

## 2013-10-08 ENCOUNTER — Ambulatory Visit (HOSPITAL_COMMUNITY)
Admission: RE | Admit: 2013-10-08 | Discharge: 2013-10-08 | Disposition: A | Discharge status: Home / Self Care | Payer: Medicare HMO | Source: Ambulatory Visit | Attending: Internal Medicine | Admitting: Internal Medicine

## 2013-10-08 ENCOUNTER — Ambulatory Visit (INDEPENDENT_AMBULATORY_CARE_PROVIDER_SITE_OTHER): Payer: Medicare PPO | Admitting: Internal Medicine

## 2013-10-08 ENCOUNTER — Encounter: Payer: Self-pay | Admitting: Internal Medicine

## 2013-10-08 ENCOUNTER — Ambulatory Visit: Payer: Self-pay | Admitting: Physician Assistant

## 2013-10-08 VITALS — BP 152/88 | HR 64 | Temp 98.0°F | Resp 12 | Ht 64.75 in | Wt 139.0 lb

## 2013-10-08 DIAGNOSIS — Z79899 Other long term (current) drug therapy: Secondary | ICD-10-CM

## 2013-10-08 DIAGNOSIS — W19XXXA Unspecified fall, initial encounter: Secondary | ICD-10-CM | POA: Insufficient documentation

## 2013-10-08 DIAGNOSIS — S20219A Contusion of unspecified front wall of thorax, initial encounter: Secondary | ICD-10-CM

## 2013-10-08 DIAGNOSIS — I7300 Raynaud's syndrome without gangrene: Secondary | ICD-10-CM

## 2013-10-08 DIAGNOSIS — E782 Mixed hyperlipidemia: Secondary | ICD-10-CM | POA: Insufficient documentation

## 2013-10-08 DIAGNOSIS — I10 Essential (primary) hypertension: Secondary | ICD-10-CM

## 2013-10-08 DIAGNOSIS — S1093XA Contusion of unspecified part of neck, initial encounter: Secondary | ICD-10-CM

## 2013-10-08 DIAGNOSIS — E785 Hyperlipidemia, unspecified: Secondary | ICD-10-CM

## 2013-10-08 DIAGNOSIS — S0003XA Contusion of scalp, initial encounter: Secondary | ICD-10-CM

## 2013-10-08 DIAGNOSIS — S20211A Contusion of right front wall of thorax, initial encounter: Secondary | ICD-10-CM

## 2013-10-08 DIAGNOSIS — I2510 Atherosclerotic heart disease of native coronary artery without angina pectoris: Secondary | ICD-10-CM

## 2013-10-08 DIAGNOSIS — S0083XA Contusion of other part of head, initial encounter: Secondary | ICD-10-CM

## 2013-10-08 DIAGNOSIS — I82409 Acute embolism and thrombosis of unspecified deep veins of unspecified lower extremity: Secondary | ICD-10-CM

## 2013-10-08 DIAGNOSIS — J449 Chronic obstructive pulmonary disease, unspecified: Secondary | ICD-10-CM

## 2013-10-08 MED ORDER — HYDROCODONE-ACETAMINOPHEN 5-325 MG PO TABS: ORAL_TABLET | ORAL | Status: DC

## 2013-10-08 NOTE — Progress Notes (Signed)
Subjective:    Patient ID: Jasmine Reeves, female    DOB: 03/04/1940, 73 y.o.   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 No LOC, palpitations, dizziness, vertigo or aura a. Post accident patient was able to get up and walk.          Current Outpatient Prescriptions on File Prior to Visit  Medication Sig Dispense Refill  . aspirin 81 MG tablet Take 81 mg by mouth daily.      . Calcium Carb-Cholecalciferol (CALCIUM 1000 + D PO) Take 1 tablet by mouth daily.      . Cholecalciferol (VITAMIN D) 2000 UNITS tablet Take 2,000 Units by mouth 2 (two) times daily.      Marland Kitchen. diltiazem (DILACOR XR) 180 MG 24 hr capsule TAKE 1 CAPSULE BY MOUTH EVERY DAY  90 capsule  1  . ezetimibe-simvastatin (VYTORIN) 10-20 MG per tablet Take 1 tablet by mouth daily.      . fexofenadine (ALLEGRA) 180 MG tablet Take 180 mg by mouth daily.      . fluticasone (VERAMYST) 27.5 MCG/SPRAY nasal spray Place 2 sprays into the nose daily as needed for rhinitis.  10 g  8  . hydrochlorothiazide (HYDRODIURIL) 25 MG tablet TAKE 1 TABLET BY MOUTH EVERY DAY FOR FLUID  90 tablet  1  . IRON PO Take 50 mg by mouth daily.      Marland Kitchen. labetalol (NORMODYNE) 100 MG tablet TAKE 1 1/2 TABLET BY MOUTH TWICE DAILY  270 tablet  0  . losartan (COZAAR) 50 MG tablet TAKE 1/2 Tablet daily      . Magnesium 400 MG CAPS Take by mouth daily.      . Multiple Vitamins-Minerals (MULTIVITAMIN WITH MINERALS) tablet Take 1 tablet by mouth daily.      . nitroGLYCERIN (NITROSTAT) 0.4 MG SL tablet Place 1 tablet (0.4 mg total) under the tongue every 5 (five) minutes as needed for chest pain.  25 tablet  12  . pantoprazole (PROTONIX) 40 MG tablet TAKE 1 TABLET BY MOUTH TWICE A DAY 1/2 HOUR BEFORE BREAKFAST AND DINNER  180 tablet  2  . TURMERIC PO Take 900 mg by mouth daily.       . vitamin C (ASCORBIC ACID) 500 MG tablet Take 500 mg by mouth daily.       No current  facility-administered medications on file prior to visit.   Allergies  Allergen Reactions  . Altace [Ramipril]     cough  . Azithromycin     palpitation  . Celebrex [Celecoxib]     Gi upset   . Hydrochlorothiazide     cramping  . 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 Prilosec [Omeprazole]     constipation   Past Medical History  Diagnosis Date  . Hypertension   . Hyperlipidemia   . GERD (gastroesophageal reflux disease)   . COPD (chronic obstructive pulmonary disease)   . Anemia   . Fibromyalgia   . Unspecified vitamin D deficiency   . Raynaud disease   . Scoliosis   . IBS (irritable bowel syndrome)   . ASHD (arteriosclerotic heart disease)    Review of Systems c/o pain to right lip and also right chest with deep breath. ROS neg otherwise.  Objective:   Physical Exam  BP 152/88  Pulse 64  Temp(Src) 98 F (  36.7 C)  Resp 12  Ht 5' 4.75" (1.645 m)  Wt 139 lb (63.05 kg)  BMI 23.30 kg/m2  HEENT - Eac's patent. TM's Nl. EOM's full. PERRLA. NasoOroPharynx clear & teeth appear intact. Tender STS of lateral right lower lip. Neck - supple. Nl Thyroid. Carotids 2+ & No bruits, nodes, JVD Chest - Tender right lateral thorax in MAL.Clear equal BS w/o Rales, rhonchi, wheezes. Cor - Nl HS. RRR w/o sig MGR. PP 1(+). No edema. Abd - No palpable organomegaly, masses or tenderness. BS nl. MS- FROM w/o deformities. Muscle power, tone and bulk Nl. Gait Nl. Neuro - No obvious Cr N abnormalities. Sensory, motor and Cerebellar functions appear Nl w/o focal abnormalities. Psyche - Mental status normal & appropriate.  No delusions, ideations or obvious mood abnormalities.  Assessment & Plan:   1. Chest wall contusion, right, initial encounter - DG Ribs Unilateral Right; Future  2. Contusion of face, initial encounter  Rx: Norco 5  (#50) prn

## 2013-10-08 NOTE — Patient Instructions (Signed)
Chest Contusion °A contusion is a deep bruise. Bruises happen when an injury causes bleeding under the skin. Signs of bruising include pain, puffiness (swelling), and discolored skin. The bruise may turn blue, purple, or yellow.  °HOME CARE °· Put ice on the injured area. °¨ Put ice in a plastic bag. °¨ Place a towel between the skin and the bag. °¨ Leave the ice on for 15-20 minutes at a time, 03-04 times a day for the first 48 hours. °· Only take medicine as told by your doctor. °· Rest. °· Take deep breaths (deep-breathing exercises) as told by your doctor. °· Stop smoking if you smoke. °· Do not lift objects over 5 pounds (2.3 kilograms) for 3 days or longer if told by your doctor. °GET HELP RIGHT AWAY IF:  °· You have more bruising or puffiness. °· You have pain that gets worse. °· You have trouble breathing. °· You are dizzy, weak, or pass out (faint). °· You have blood in your pee (urine) or poop (stool). °· You cough up or throw up (vomit) blood. °· Your puffiness or pain is not helped with medicines. °MAKE SURE YOU:  °· Understand these instructions. °· Will watch your condition. °· Will get help right away if you are not doing well or get worse. °Document Released: 08/04/2007 Document Revised: 11/10/2011 Document Reviewed: 08/09/2011 °[employer]® Patient Information ©2015 [employer]. This information is not intended to replace advice given to you by your health care provider. Make sure you discuss any questions you have with your health care provider. ° °

## 2013-10-08 NOTE — Telephone Encounter (Signed)
patient aware of normal chest x-ray.

## 2013-10-10 ENCOUNTER — Encounter: Payer: Self-pay | Admitting: Internal Medicine

## 2013-10-10 ENCOUNTER — Ambulatory Visit (INDEPENDENT_AMBULATORY_CARE_PROVIDER_SITE_OTHER): Payer: Medicare PPO | Admitting: Internal Medicine

## 2013-10-10 VITALS — BP 116/70 | HR 64 | Temp 98.4°F | Resp 16 | Ht 64.75 in | Wt 140.0 lb

## 2013-10-10 DIAGNOSIS — E559 Vitamin D deficiency, unspecified: Secondary | ICD-10-CM

## 2013-10-10 DIAGNOSIS — I10 Essential (primary) hypertension: Secondary | ICD-10-CM

## 2013-10-10 DIAGNOSIS — Z79899 Other long term (current) drug therapy: Secondary | ICD-10-CM

## 2013-10-10 DIAGNOSIS — R7309 Other abnormal glucose: Secondary | ICD-10-CM

## 2013-10-10 DIAGNOSIS — E785 Hyperlipidemia, unspecified: Secondary | ICD-10-CM

## 2013-10-10 LAB — CBC WITH DIFFERENTIAL/PLATELET
Basophils Absolute: 0.1 10*3/uL (ref 0.0–0.1)
Basophils Relative: 1 % (ref 0–1)
Eosinophils Absolute: 0.2 10*3/uL (ref 0.0–0.7)
Eosinophils Relative: 3 % (ref 0–5)
HCT: 37.4 % (ref 36.0–46.0)
Hemoglobin: 12.9 g/dL (ref 12.0–15.0)
Lymphocytes Relative: 13 % (ref 12–46)
Lymphs Abs: 0.7 10*3/uL (ref 0.7–4.0)
MCH: 30.6 pg (ref 26.0–34.0)
MCHC: 34.5 g/dL (ref 30.0–36.0)
MCV: 88.8 fL (ref 78.0–100.0)
Monocytes Absolute: 0.4 10*3/uL (ref 0.1–1.0)
Monocytes Relative: 7 % (ref 3–12)
Neutro Abs: 4.3 10*3/uL (ref 1.7–7.7)
Neutrophils Relative %: 76 % (ref 43–77)
Platelets: 177 10*3/uL (ref 150–400)
RBC: 4.21 MIL/uL (ref 3.87–5.11)
RDW: 13.3 % (ref 11.5–15.5)
WBC: 5.7 10*3/uL (ref 4.0–10.5)

## 2013-10-10 LAB — HEMOGLOBIN A1C
Hgb A1c MFr Bld: 6 % — ABNORMAL HIGH (ref <5.7)
Mean Plasma Glucose: 126 mg/dL — ABNORMAL HIGH (ref <117)

## 2013-10-10 MED ORDER — PREDNISONE 20 MG PO TABS: ORAL_TABLET | ORAL | Status: DC

## 2013-10-10 NOTE — Progress Notes (Signed)
Patient ID: Jasmine Reeves, female   DOB: 07-20-1940, 73 y.o.   MRN: [id number]   This very nice 73 y.o.female presents for 3 month follow up with Hypertension, ASCAD s/p CABG, Hyperlipidemia, Pre-Diabetes and Vitamin D Deficiency. 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 CXR/Rib Details were neg.   Patient is treated for HTN & BP has been controlled at home. Today's BP: 116/70 mmHg. Patient denies any cardiac type exertional chest pain, palpitations, dyspnea/orthopnea/PND, dizziness, claudication, or dependent edema.   Hyperlipidemia is controlled with diet & meds. Patient denies myalgias or other med SE's. Last Lipids were 07/12/2013: Cholesterol, Total 153; HDL Cholesterol by NMR 67; LDL (calc) 72; Triglycerides 69   Also, the patient has history of PreDiabetes and patient denies any symptoms of reactive hypoglycemia, diabetic polys, paresthesias or visual blurring.  Last A1c was 07/12/2013: Hemoglobin-A1c 5.8*    Further, Patient has history of Vitamin D Deficiency and patient supplements vitamin D without any suspected side-effects. Last vitamin D was 07/12/2013: Vit D, 25-Hydroxy 76     Medication List   aspirin 81 MG tablet  Take 81 mg by mouth daily.     CALCIUM 1000 + D PO  Take 1 tablet by mouth daily.     diltiazem 180 MG 24 hr capsule  Commonly known as:  DILACOR XR  TAKE 1 CAPSULE BY MOUTH EVERY DAY     ezetimibe-simvastatin 10-20 MG per tablet  Commonly known as:  VYTORIN  Take 1 tablet by mouth daily.     fexofenadine 180 MG tablet  Commonly known as:  ALLEGRA  Take 180 mg by mouth daily.     fluticasone 27.5 MCG/SPRAY nasal spray  Commonly known as:  VERAMYST  Place 2 sprays into the nose daily as needed for rhinitis.     hydrochlorothiazide 25 MG tablet  Commonly known as:  HYDRODIURIL  TAKE 1 TABLET BY MOUTH EVERY DAY FOR FLUID     HYDROcodone-acetaminophen 5-325 MG per tablet  Commonly known as:  NORCO  Take  1/2 to 1 (or 2) tablets every 3 to 4 hours as needed for pain - (Max 8 tabs/24 Hr)     IRON PO  Take 50 mg by mouth daily.     labetalol 100 MG tablet  Commonly known as:  NORMODYNE  TAKE 1 1/2 TABLET BY MOUTH TWICE DAILY     losartan 50 MG tablet  Commonly known as:  COZAAR  TAKE 1/2 Tablet daily     Magnesium 400 MG Caps  Take by mouth daily.     multivitamin with minerals tablet  Take 1 tablet by mouth daily.     nitroGLYCERIN 0.4 MG SL tablet  Commonly known as:  NITROSTAT  Place 1 tablet (0.4 mg total) under the tongue every 5 (five) minutes as needed for chest pain.     pantoprazole 40 MG tablet  Commonly known as:  PROTONIX  TAKE 1 TABLET BY MOUTH TWICE A DAY 1/2 HOUR BEFORE BREAKFAST AND DINNER     TURMERIC PO  Take 900 mg by mouth daily.     vitamin C 500 MG tablet  Commonly known as:  ASCORBIC ACID  Take 500 mg by mouth daily.     Vitamin D 2000 UNITS tablet  Take 2,000 Units by mouth 2 (two) times daily.     Allergies  Allergen Reactions  . Altace [Ramipril]     cough  . Azithromycin  palpitation  . Celebrex [Celecoxib]     Gi upset   . Hydrochlorothiazide     cramping  . 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 Prilosec [Omeprazole]     constipation   PMHx:   Past Medical History  Diagnosis Date  . Hypertension   . Hyperlipidemia   . GERD (gastroesophageal reflux disease)   . COPD (chronic obstructive pulmonary disease)   . Anemia   . Fibromyalgia   . Unspecified vitamin D deficiency   . Raynaud disease   . Scoliosis   . IBS (irritable bowel syndrome)   . ASHD (arteriosclerotic heart disease)    FHx:    Reviewed / unchanged SHx:    Reviewed / unchanged  Systems Review:  Constitutional: Denies fever, chills, wt changes, headaches, insomnia, fatigue, night sweats, change in appetite. Eyes: Denies redness, blurred vision, diplopia, discharge, itchy, watery eyes.  ENT: Denies discharge, congestion, post  nasal drip, epistaxis, sore throat, earache, hearing loss, dental pain, tinnitus, vertigo, sinus pain, snoring.  CV: Denies chest pain, palpitations, irregular heartbeat, syncope, dyspnea, diaphoresis, orthopnea, PND, claudication or edema. Respiratory: denies cough, dyspnea, DOE, pleurisy, hoarseness, laryngitis, wheezing.  Gastrointestinal: Denies dysphagia, odynophagia, heartburn, reflux, water brash, abdominal pain or cramps, nausea, vomiting, bloating, diarrhea, constipation, hematemesis, melena, hematochezia  or hemorrhoids. Genitourinary: Denies dysuria, frequency, urgency, nocturia, hesitancy, discharge, hematuria or flank pain. Musculoskeletal: Denies arthralgias, myalgias, stiffness, jt. swelling, pain, limping or strain/sprain.  Skin: Denies pruritus, rash, hives, warts, acne, eczema or change in skin lesion(s). Neuro: No weakness, tremor, incoordination, spasms, paresthesia or pain. Psychiatric: Denies confusion, memory loss or sensory loss. Endo: Denies change in weight, skin or hair change.  Heme/Lymph: No excessive bleeding, bruising or enlarged lymph nodes.  Exam:  BP 116/70  P 64   98.4 F    16  Ht 5' 4.75"   Wt 140 lb   BMI 23.47 kg/m2  Appears well nourished and in no distress. Eyes: PERRLA, EOMs, conjunctiva no swelling or erythema. Sinuses: No frontal/maxillary tenderness ENT/Mouth: EAC's clear, TM's nl w/o erythema, bulging. Nares clear w/o erythema, swelling, exudates. Ecchymoses & STS over the Right lower lip & chin.Oropharynx clear without erythema or exudates. Oral hygiene is good. Tongue normal, non obstructing. Hearing intact.  Neck: Supple. Thyroid nl. Car 2+/2+ without bruits, nodes or JVD. Chest: Respirations nl with BS clear & equal w/o rales, rhonchi, wheezing or stridor. Moderately tender over the right lateral to posterior chest wall. Cor: Heart sounds normal w/ regular rate and rhythm without sig. murmurs, gallops, clicks, or rubs. Peripheral pulses  normal and equal  without edema.  Abdomen: Soft & bowel sounds normal. Non-tender w/o guarding, rebound, hernias, masses, or organomegaly.  Lymphatics: Unremarkable.  Musculoskeletal: Full ROM all peripheral extremities, joint stability, 5/5 strength, and normal gait.  Skin: Warm, dry without exposed rashes, lesions or ecchymosis apparent.  Neuro: Cranial nerves intact, reflexes equal bilaterally. Sensory-motor testing grossly intact. Tendon reflexes grossly intact.  Pysch: Alert & oriented x 3. Insight and judgement nl & appropriate. No ideations.  Assessment and Plan:  1. Hypertension - Continue monitor blood pressure at home. Continue diet/meds same.  2. Hyperlipidemia - Continue diet/meds, exercise,& lifestyle modifications. Continue monitor periodic cholesterol/liver & renal functions   3. Pre-Diabetes - Continue diet, exercise, lifestyle modifications. Monitor appropriate labs.  4. Vitamin D Deficiency - Continue supplementation.  5. ASCAD s/p CABG  6. Chest Wall Contusion - try prednisone to resolve tenderness and pain  Recommended regular exercise, BP monitoring, weight control, and discussed med and SE's. Recommended labs to assess and monitor clinical status. Further disposition pending results of labs.

## 2013-10-10 NOTE — Patient Instructions (Signed)

## 2013-10-11 LAB — HEPATIC FUNCTION PANEL
ALT: 22 U/L (ref 0–35)
AST: 23 U/L (ref 0–37)
Albumin: 4.3 g/dL (ref 3.5–5.2)
Alkaline Phosphatase: 44 U/L (ref 39–117)
Bilirubin, Direct: 0.1 mg/dL (ref 0.0–0.3)
Indirect Bilirubin: 0.5 mg/dL (ref 0.2–1.2)
Total Bilirubin: 0.6 mg/dL (ref 0.2–1.2)
Total Protein: 6.2 g/dL (ref 6.0–8.3)

## 2013-10-11 LAB — BASIC METABOLIC PANEL WITH GFR
BUN: 14 mg/dL (ref 6–23)
CO2: 27 mEq/L (ref 19–32)
Calcium: 8.8 mg/dL (ref 8.4–10.5)
Chloride: 102 mEq/L (ref 96–112)
Creat: 0.75 mg/dL (ref 0.50–1.10)
GFR, Est African American: >89 mL/min
GFR, Est Non African American: 80 mL/min
Glucose, Bld: 127 mg/dL — ABNORMAL HIGH (ref 70–99)
Potassium: 4 mEq/L (ref 3.5–5.3)
Sodium: 138 mEq/L (ref 135–145)

## 2013-10-11 LAB — LIPID PANEL
Cholesterol: 143 mg/dL (ref 0–200)
HDL: 61 mg/dL (ref >39)
LDL Cholesterol: 68 mg/dL (ref 0–99)
Total CHOL/HDL Ratio: 2.3 Ratio
Triglycerides: 69 mg/dL (ref <150)
VLDL: 14 mg/dL (ref 0–40)

## 2013-10-11 LAB — INSULIN, FASTING: Insulin fasting, serum: 48 u[IU]/mL — ABNORMAL HIGH (ref 3–28)

## 2013-10-11 LAB — TSH: TSH: 3.417 u[IU]/mL (ref 0.350–4.500)

## 2013-10-11 LAB — MAGNESIUM: Magnesium: 1.8 mg/dL (ref 1.5–2.5)

## 2013-10-11 LAB — VITAMIN D 25 HYDROXY (VIT D DEFICIENCY, FRACTURES): Vit D, 25-Hydroxy: 82 ng/mL (ref 30–89)

## 2013-10-18 ENCOUNTER — Encounter: Payer: Self-pay | Admitting: Physician Assistant

## 2013-10-18 ENCOUNTER — Ambulatory Visit (HOSPITAL_COMMUNITY)
Admission: RE | Admit: 2013-10-18 | Discharge: 2013-10-18 | Disposition: A | Discharge status: Home / Self Care | Payer: Medicare HMO | Source: Ambulatory Visit | Attending: Physician Assistant | Admitting: Physician Assistant

## 2013-10-18 ENCOUNTER — Ambulatory Visit (INDEPENDENT_AMBULATORY_CARE_PROVIDER_SITE_OTHER): Payer: Medicare PPO | Admitting: Physician Assistant

## 2013-10-18 VITALS — BP 138/78 | HR 68 | Temp 98.7°F | Resp 16 | Ht 64.0 in | Wt 140.0 lb

## 2013-10-18 DIAGNOSIS — W19XXXA Unspecified fall, initial encounter: Secondary | ICD-10-CM | POA: Insufficient documentation

## 2013-10-18 DIAGNOSIS — R0602 Shortness of breath: Secondary | ICD-10-CM

## 2013-10-18 DIAGNOSIS — R1011 Right upper quadrant pain: Secondary | ICD-10-CM

## 2013-10-18 DIAGNOSIS — I2510 Atherosclerotic heart disease of native coronary artery without angina pectoris: Secondary | ICD-10-CM

## 2013-10-18 DIAGNOSIS — S2249XA Multiple fractures of ribs, unspecified side, initial encounter for closed fracture: Secondary | ICD-10-CM | POA: Insufficient documentation

## 2013-10-18 DIAGNOSIS — I10 Essential (primary) hypertension: Secondary | ICD-10-CM | POA: Diagnosis not present

## 2013-10-18 DIAGNOSIS — R079 Chest pain, unspecified: Secondary | ICD-10-CM | POA: Insufficient documentation

## 2013-10-18 DIAGNOSIS — S20211A Contusion of right front wall of thorax, initial encounter: Secondary | ICD-10-CM

## 2013-10-18 DIAGNOSIS — K219 Gastro-esophageal reflux disease without esophagitis: Secondary | ICD-10-CM

## 2013-10-18 DIAGNOSIS — S20219A Contusion of unspecified front wall of thorax, initial encounter: Secondary | ICD-10-CM

## 2013-10-18 DIAGNOSIS — I517 Cardiomegaly: Secondary | ICD-10-CM | POA: Diagnosis not present

## 2013-10-18 LAB — BASIC METABOLIC PANEL WITH GFR
BUN: 17 mg/dL (ref 6–23)
CO2: 27 mEq/L (ref 19–32)
Calcium: 9 mg/dL (ref 8.4–10.5)
Chloride: 104 mEq/L (ref 96–112)
Creat: 0.65 mg/dL (ref 0.50–1.10)
GFR, Est African American: >89 mL/min
GFR, Est Non African American: >89 mL/min
Glucose, Bld: 117 mg/dL — ABNORMAL HIGH (ref 70–99)
Potassium: 3.7 mEq/L (ref 3.5–5.3)
Sodium: 140 mEq/L (ref 135–145)

## 2013-10-18 LAB — CBC WITH DIFFERENTIAL/PLATELET
Basophils Absolute: 0 10*3/uL (ref 0.0–0.1)
Basophils Relative: 0 % (ref 0–1)
Eosinophils Absolute: 0 10*3/uL (ref 0.0–0.7)
Eosinophils Relative: 0 % (ref 0–5)
HCT: 38.3 % (ref 36.0–46.0)
Hemoglobin: 13 g/dL (ref 12.0–15.0)
Lymphocytes Relative: 10 % — ABNORMAL LOW (ref 12–46)
Lymphs Abs: 0.9 10*3/uL (ref 0.7–4.0)
MCH: 30.2 pg (ref 26.0–34.0)
MCHC: 33.9 g/dL (ref 30.0–36.0)
MCV: 88.9 fL (ref 78.0–100.0)
Monocytes Absolute: 0.8 10*3/uL (ref 0.1–1.0)
Monocytes Relative: 9 % (ref 3–12)
Neutro Abs: 7.3 10*3/uL (ref 1.7–7.7)
Neutrophils Relative %: 81 % — ABNORMAL HIGH (ref 43–77)
Platelets: 223 10*3/uL (ref 150–400)
RBC: 4.31 MIL/uL (ref 3.87–5.11)
RDW: 13.2 % (ref 11.5–15.5)
WBC: 9 10*3/uL (ref 4.0–10.5)

## 2013-10-18 LAB — D-DIMER, QUANTITATIVE: D-Dimer, Quant: 0.35 ug/mL-FEU (ref 0.00–0.48)

## 2013-10-18 LAB — HEPATIC FUNCTION PANEL
ALT: 30 U/L (ref 0–35)
AST: 19 U/L (ref 0–37)
Albumin: 4.2 g/dL (ref 3.5–5.2)
Alkaline Phosphatase: 50 U/L (ref 39–117)
Bilirubin, Direct: 0.2 mg/dL (ref 0.0–0.3)
Indirect Bilirubin: 0.4 mg/dL (ref 0.2–1.2)
Total Bilirubin: 0.6 mg/dL (ref 0.2–1.2)
Total Protein: 6 g/dL (ref 6.0–8.3)

## 2013-10-18 NOTE — Progress Notes (Signed)
   Subjective:    Patient ID: Jasmine Reeves, female    DOB: 1940/08/30, 73 y.o.   MRN: [id number]  HPI 73 y.o. female with a fall on 10/08/2013 presents s/p fall. She states she has been sitting around, she has had fatigue, not been driving or doing much. She went to pick up her grandson Saturday and felt exhausted by that. She is suppose to work [employer] like she does every year but she could not Monday, or Tuesday. She has some sinus congestion, right jaw pain, she feels she is having trouble breathing and has some right chest pain. Normal Myoview stress test 2015, CXR showed no rib fracture. She continues to have right chest pain but now it is upper chest pain, worse with deep breath/cough.   Blood pressure 138/78, pulse 68, temperature 98.7 F (37.1 C), resp. rate 16, height 5\' 4"  (1.626 m), weight 140 lb (63.504 kg), SpO2 99.00%.  Review of Systems  Constitutional: Positive for fatigue. Negative for fever, chills, diaphoresis, activity change, appetite change and unexpected weight change.  HENT: Negative.   Eyes: Negative.   Respiratory: Positive for chest tightness and shortness of breath. Negative for apnea, cough, choking, wheezing and stridor.   Cardiovascular: Positive for chest pain and palpitations. Negative for leg swelling.  Gastrointestinal: Negative.   Genitourinary: Negative.   Musculoskeletal: Positive for arthralgias and myalgias. Negative for back pain, gait problem, joint swelling, neck pain and neck stiffness.  Neurological: Negative.        Objective:   Physical Exam  Constitutional: She is oriented to person, place, and time. She appears well-developed and well-nourished.  HENT:  Head: Normocephalic and atraumatic.  Eyes: Conjunctivae are normal. Pupils are equal, round, and reactive to light.  Neck: Normal range of motion. Neck supple.  Soft out pouching of left supraclavicular but no lymphnode appreciated   Cardiovascular: Normal rate and regular rhythm.    Pulmonary/Chest: Effort normal and breath sounds normal. She has no wheezes. She exhibits tenderness (right sided).  Abdominal: Soft. Bowel sounds are normal. She exhibits no distension. There is tenderness (RUQ). There is no rebound and no guarding.  Musculoskeletal: Normal range of motion.  Lymphadenopathy:    She has no cervical adenopathy.  Neurological: She is alert and oriented to person, place, and time. She has normal reflexes. No cranial nerve deficit. Coordination normal.  Skin: Skin is warm and dry.  Ecchymosis on right chin       Assessment & Plan:  SOB/anxiety s/p fall with history of MI/CABG/COPD- will get EKG which has no changes, CBC CMET, will get Ddimer to rule out PE and repeat CXR.  RUQ pain- check CMET, stop prednisone due to being able to exacerbate GERD/increase anxiety

## 2013-10-19 ENCOUNTER — Other Ambulatory Visit: Payer: Self-pay | Admitting: Physician Assistant

## 2013-10-19 MED ORDER — TRAMADOL HCL 50 MG PO TABS: 50.0000 mg | ORAL_TABLET | Freq: Four times a day (QID) | ORAL | Status: DC | PRN

## 2013-11-19 ENCOUNTER — Other Ambulatory Visit: Payer: Self-pay | Admitting: Internal Medicine

## 2013-12-03 ENCOUNTER — Other Ambulatory Visit: Payer: Self-pay | Admitting: Physician Assistant

## 2013-12-05 ENCOUNTER — Ambulatory Visit (INDEPENDENT_AMBULATORY_CARE_PROVIDER_SITE_OTHER): Payer: Medicare HMO | Admitting: Physician Assistant

## 2013-12-05 ENCOUNTER — Encounter: Payer: Self-pay | Admitting: Physician Assistant

## 2013-12-05 VITALS — BP 110/62 | HR 64 | Temp 98.1°F | Resp 16 | Ht 64.0 in | Wt 141.0 lb

## 2013-12-05 DIAGNOSIS — M25551 Pain in right hip: Secondary | ICD-10-CM

## 2013-12-05 DIAGNOSIS — R0602 Shortness of breath: Secondary | ICD-10-CM

## 2013-12-05 DIAGNOSIS — R55 Syncope and collapse: Secondary | ICD-10-CM

## 2013-12-05 DIAGNOSIS — M533 Sacrococcygeal disorders, not elsewhere classified: Secondary | ICD-10-CM

## 2013-12-05 MED ORDER — DEXAMETHASONE SODIUM PHOSPHATE 100 MG/10ML IJ SOLN
10.0000 mg | Freq: Once | INTRAMUSCULAR | Status: DC
Start: 2013-12-05 — End: 2014-04-12

## 2013-12-05 NOTE — Patient Instructions (Signed)
Sciatica Sciatica is pain, weakness, numbness, or tingling along the path of the sciatic nerve. The nerve starts in the lower back and runs down the back of each leg. The nerve controls the muscles in the lower leg and in the back of the knee, while also providing sensation to the back of the thigh, lower leg, and the sole of your foot. Sciatica is a symptom of another medical condition. For instance, nerve damage or certain conditions, such as a herniated disk or bone spur on the spine, pinch or put pressure on the sciatic nerve. This causes the pain, weakness, or other sensations normally associated with sciatica. Generally, sciatica only affects one side of the body. CAUSES   Herniated or slipped disc.  Degenerative disk disease.  A pain disorder involving the narrow muscle in the buttocks (piriformis syndrome).  Pelvic injury or fracture.  Pregnancy.  Tumor (rare). SYMPTOMS  Symptoms can vary from mild to very severe. The symptoms usually travel from the low back to the buttocks and down the back of the leg. Symptoms can include:  Mild tingling or dull aches in the lower back, leg, or hip.  Numbness in the back of the calf or sole of the foot.  Burning sensations in the lower back, leg, or hip.  Sharp pains in the lower back, leg, or hip.  Leg weakness.  Severe back pain inhibiting movement. These symptoms may get worse with coughing, sneezing, laughing, or prolonged sitting or standing. Also, being overweight may worsen symptoms. DIAGNOSIS  Your caregiver will perform a physical exam to look for common symptoms of sciatica. He or she may ask you to do certain movements or activities that would trigger sciatic nerve pain. Other tests may be performed to find the cause of the sciatica. These may include:  Blood tests.  X-rays.  Imaging tests, such as an MRI or CT scan. TREATMENT  Treatment is directed at the cause of the sciatic pain. Sometimes, treatment is not necessary  and the pain and discomfort goes away on its own. If treatment is needed, your caregiver may suggest:  Over-the-counter medicines to relieve pain.  Prescription medicines, such as anti-inflammatory medicine, muscle relaxants, or narcotics.  Applying heat or ice to the painful area.  Steroid injections to lessen pain, irritation, and inflammation around the nerve.  Reducing activity during periods of pain.  Exercising and stretching to strengthen your abdomen and improve flexibility of your spine. Your caregiver may suggest losing weight if the extra weight makes the back pain worse.  Physical therapy.  Surgery to eliminate what is pressing or pinching the nerve, such as a bone spur or part of a herniated disk. HOME CARE INSTRUCTIONS   Only take over-the-counter or prescription medicines for pain or discomfort as directed by your caregiver.  Apply ice to the affected area for 20 minutes, 3-4 times a day for the first 48-72 hours. Then try heat in the same way.  Exercise, stretch, or perform your usual activities if these do not aggravate your pain.  Attend physical therapy sessions as directed by your caregiver.  Keep all follow-up appointments as directed by your caregiver.  Do not wear high heels or shoes that do not provide proper support.  Check your mattress to see if it is too soft. A firm mattress may lessen your pain and discomfort. SEEK IMMEDIATE MEDICAL CARE IF:   You lose control of your bowel or bladder (incontinence).  You have increasing weakness in the lower back, pelvis, buttocks,   or legs.  You have redness or swelling of your back.  You have a burning sensation when you urinate.  You have pain that gets worse when you lie down or awakens you at night.  Your pain is worse than you have experienced in the past.  Your pain is lasting longer than 4 weeks.  You are suddenly losing weight without reason. MAKE SURE YOU:  Understand these  instructions.  Will watch your condition.  Will get help right away if you are not doing well or get worse. Document Released: 02/09/2001 Document Revised: 08/17/2011 Document Reviewed: 06/27/2011 Exit[employer] Patient Information 2015 [employer]. This information is not intended to replace advice given to you by your health care provider. Make sure you discuss any questions you have with your health care provider.  

## 2013-12-05 NOTE — Progress Notes (Signed)
Subjective:    Patient ID: Jasmine Reeves, female    DOB: 11-18-1940, 73 y.o.   MRN: [id number]  HPI 73 y.o. female with fall 2 month ago with rib fractures presents with right hip pain x 1 month. 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  She has had occ dyspnea, occ sharp right shoulder pain and has not felt well since the fall. She has had 3 separate episodes of diarrhea and abdominal cramping, she denies any association with food, she has had a decreased appetite in the AM.   Right hip pain x 1 month- lower back pain with right posterior/lateral hip pain, no radiation, denies numbness, tingling. With mild weakness right leg. Lying down is worse, difficult to bend over/get up and down from sitting position.   Lab Results  Component Value Date   DDIMER 0.35 10/18/2013     Review of Systems  Constitutional: Negative.   Respiratory: Positive for shortness of breath. Negative for cough, chest tightness and wheezing.   Cardiovascular: Positive for chest pain. Negative for palpitations and leg swelling.  Gastrointestinal: Positive for abdominal pain and diarrhea. Negative for nausea, vomiting, constipation, blood in stool, abdominal distention, anal bleeding and rectal pain.  Musculoskeletal: Positive for arthralgias (shoulders and hips), back pain (right hip/back) and myalgias.  Skin: Negative.  Negative for rash.  Neurological: Positive for dizziness and light-headedness. Negative for tremors, seizures, syncope, facial asymmetry, speech difficulty, weakness, numbness and headaches.  Psychiatric/Behavioral: Negative for suicidal ideas and self-injury. The patient is nervous/anxious.        Objective:   Physical Exam  Constitutional: She is oriented to person, place, and time. She appears  well-developed and well-nourished.  HENT:  Head: Normocephalic and atraumatic.  Right Ear: External ear normal.  Left Ear: External ear normal.  Mouth/Throat: Oropharynx is clear and moist.  Eyes: Conjunctivae and EOM are normal. Pupils are equal, round, and reactive to light.  Neck: Normal range of motion. Neck supple. No thyromegaly present.  Cardiovascular: Normal rate, regular rhythm and normal heart sounds.  Exam reveals no gallop and no friction rub.   No murmur heard. Pulmonary/Chest: Effort normal and breath sounds normal. No respiratory distress. She has no wheezes.  Abdominal: Soft. Bowel sounds are normal. She exhibits no distension and no mass. There is no tenderness. There is no rebound and no guarding.  Musculoskeletal: Normal range of motion.  Patient is able to ambulate well.  Gait is not  antalgic. Straight leg raising negative bilaterally for radicular symptoms. Sensory exam in the legs is normal.  Knee reflexes are abnormal  Slightly decreased right side Ankle reflexes are Slightly decreased right side Strength is normal and symmetric. There is notparaspinal muscle spasm.  There is not midline tenderness. There is right SI tenderness. ROM of spine with normal flexion, extension, lateral range of motion to the right and left, and rotation to the right and left.  Full ROM right hip without pain.  Lymphadenopathy:    She has no cervical adenopathy.  Neurological: She is alert and oriented to person, place, and time. She displays normal reflexes. No cranial nerve deficit. Coordination normal.  Skin: Skin is warm and dry.  Psychiatric: She has a normal mood and affect.      Assessment & Plan:  Right hip pain likely right SI pain- check ESR, dexamethasone injection, will refer to PT Near syncope- stop HCTZ, check sugars, recheck Ddimer, suggest follow up cardio for holter Anxiety- ? possibility of anxiety contributing, she declines SSRI/meds, will do PT in hopes of  decreased fear of falling.

## 2013-12-06 LAB — CBC WITH DIFFERENTIAL/PLATELET
Basophils Absolute: 0.1 10*3/uL (ref 0.0–0.1)
Basophils Relative: 1 % (ref 0–1)
Eosinophils Absolute: 0.2 10*3/uL (ref 0.0–0.7)
Eosinophils Relative: 3 % (ref 0–5)
HCT: 35.9 % — ABNORMAL LOW (ref 36.0–46.0)
Hemoglobin: 12.5 g/dL (ref 12.0–15.0)
Lymphocytes Relative: 18 % (ref 12–46)
Lymphs Abs: 0.9 10*3/uL (ref 0.7–4.0)
MCH: 30.6 pg (ref 26.0–34.0)
MCHC: 34.8 g/dL (ref 30.0–36.0)
MCV: 87.8 fL (ref 78.0–100.0)
Monocytes Absolute: 0.6 10*3/uL (ref 0.1–1.0)
Monocytes Relative: 11 % (ref 3–12)
Neutro Abs: 3.4 10*3/uL (ref 1.7–7.7)
Neutrophils Relative %: 67 % (ref 43–77)
Platelets: 194 10*3/uL (ref 150–400)
RBC: 4.09 MIL/uL (ref 3.87–5.11)
RDW: 13.5 % (ref 11.5–15.5)
WBC: 5.1 10*3/uL (ref 4.0–10.5)

## 2013-12-06 LAB — HEPATIC FUNCTION PANEL
ALT: 19 U/L (ref 0–35)
AST: 20 U/L (ref 0–37)
Albumin: 4.2 g/dL (ref 3.5–5.2)
Alkaline Phosphatase: 52 U/L (ref 39–117)
Bilirubin, Direct: 0.2 mg/dL (ref 0.0–0.3)
Indirect Bilirubin: 0.4 mg/dL (ref 0.2–1.2)
Total Bilirubin: 0.6 mg/dL (ref 0.2–1.2)
Total Protein: 6.3 g/dL (ref 6.0–8.3)

## 2013-12-06 LAB — BASIC METABOLIC PANEL WITH GFR
BUN: 15 mg/dL (ref 6–23)
CO2: 25 mEq/L (ref 19–32)
Calcium: 9 mg/dL (ref 8.4–10.5)
Chloride: 105 mEq/L (ref 96–112)
Creat: 0.78 mg/dL (ref 0.50–1.10)
GFR, Est African American: 88 mL/min
GFR, Est Non African American: 77 mL/min
Glucose, Bld: 88 mg/dL (ref 70–99)
Potassium: 3.5 mEq/L (ref 3.5–5.3)
Sodium: 139 mEq/L (ref 135–145)

## 2013-12-06 LAB — D-DIMER, QUANTITATIVE: D-Dimer, Quant: 0.32 ug/mL-FEU (ref 0.00–0.48)

## 2013-12-06 LAB — SEDIMENTATION RATE: Sed Rate: 4 mm/hr (ref 0–22)

## 2013-12-11 ENCOUNTER — Telehealth: Payer: Self-pay | Admitting: Internal Medicine

## 2013-12-11 NOTE — Telephone Encounter (Signed)
Patient called to request that a P.T. Referral to be sent again, the place you referred her to does not accept her insurance.  Please advise patient of a provider that is CORF approved.   Thank you, Jasmine Reeves [facility] [phone] Fax [phone]

## 2014-01-03 ENCOUNTER — Encounter: Payer: Self-pay | Admitting: Physician Assistant

## 2014-01-03 ENCOUNTER — Other Ambulatory Visit: Payer: Self-pay | Admitting: Physician Assistant

## 2014-01-03 DIAGNOSIS — M533 Sacrococcygeal disorders, not elsewhere classified: Secondary | ICD-10-CM

## 2014-01-03 DIAGNOSIS — M25551 Pain in right hip: Secondary | ICD-10-CM

## 2014-01-10 ENCOUNTER — Ambulatory Visit: Payer: Self-pay | Admitting: Physician Assistant

## 2014-02-12 ENCOUNTER — Other Ambulatory Visit: Payer: Self-pay | Admitting: Emergency Medicine

## 2014-02-12 ENCOUNTER — Other Ambulatory Visit: Payer: Self-pay | Admitting: Internal Medicine

## 2014-03-05 ENCOUNTER — Other Ambulatory Visit: Payer: Self-pay | Admitting: Physician Assistant

## 2014-04-03 ENCOUNTER — Encounter: Payer: Self-pay | Admitting: Internal Medicine

## 2014-04-12 ENCOUNTER — Ambulatory Visit (INDEPENDENT_AMBULATORY_CARE_PROVIDER_SITE_OTHER): Payer: Medicare HMO | Admitting: Internal Medicine

## 2014-04-12 ENCOUNTER — Encounter: Payer: Self-pay | Admitting: Internal Medicine

## 2014-04-12 VITALS — BP 110/62 | HR 56 | Temp 97.9°F | Resp 16 | Ht 64.5 in | Wt 140.2 lb

## 2014-04-12 DIAGNOSIS — I2510 Atherosclerotic heart disease of native coronary artery without angina pectoris: Secondary | ICD-10-CM

## 2014-04-12 DIAGNOSIS — R7309 Other abnormal glucose: Secondary | ICD-10-CM

## 2014-04-12 DIAGNOSIS — E782 Mixed hyperlipidemia: Secondary | ICD-10-CM

## 2014-04-12 DIAGNOSIS — Z79899 Other long term (current) drug therapy: Secondary | ICD-10-CM | POA: Insufficient documentation

## 2014-04-12 DIAGNOSIS — Z9181 History of falling: Secondary | ICD-10-CM

## 2014-04-12 DIAGNOSIS — Z1331 Encounter for screening for depression: Secondary | ICD-10-CM

## 2014-04-12 DIAGNOSIS — R5383 Other fatigue: Secondary | ICD-10-CM

## 2014-04-12 DIAGNOSIS — Z1212 Encounter for screening for malignant neoplasm of rectum: Secondary | ICD-10-CM

## 2014-04-12 DIAGNOSIS — I2583 Coronary atherosclerosis due to lipid rich plaque: Secondary | ICD-10-CM

## 2014-04-12 DIAGNOSIS — E559 Vitamin D deficiency, unspecified: Secondary | ICD-10-CM

## 2014-04-12 DIAGNOSIS — K219 Gastro-esophageal reflux disease without esophagitis: Secondary | ICD-10-CM

## 2014-04-12 DIAGNOSIS — Z23 Encounter for immunization: Secondary | ICD-10-CM

## 2014-04-12 DIAGNOSIS — R7303 Prediabetes: Secondary | ICD-10-CM

## 2014-04-12 DIAGNOSIS — I10 Essential (primary) hypertension: Secondary | ICD-10-CM

## 2014-04-12 NOTE — Patient Instructions (Signed)

## 2014-04-12 NOTE — Progress Notes (Signed)
Patient ID: Jasmine Reeves, female   DOB: August 20, 1940, 74 y.o.   MRN: [id number]  Annual Comprehensive Examination  This very nice 74 y.o. MWF presents for complete physical.  Patient has been followed for HTN, Prediabetes, Hyperlipidemia, and Vitamin D Deficiency.    HTN predates since 2001 when she presented with an acute coronary syndrome & underwent a CABG. Heart cath in 2011 was Negative. Patient's BP has been controlled at home and patient denies any cardiac symptoms as chest pain, palpitations, shortness of breath, dizziness or ankle swelling. Today's BP: 110/62 mmHg    Patient's hyperlipidemia is controlled with diet and medications. Patient denies myalgias or other medication SE's. Last lipids were 10/10/2013: Cholesterol, Total 143; HDL Cholesterol by NMR 61; LDL (calc) 68; Triglycerides 69 on    Patient has prediabetes predating since  Apr 2012 with an A1c of 6.3%  and patient denies reactive hypoglycemic symptoms, visual blurring, diabetic polys, or paresthesias. Last A1c was 6.0% on 10/10/2013.   Finally, patient has history of Vitamin D Deficiency and last Vitamin D was  82 on 10/10/2013.  Medication Sig  . aspirin 81 MG tablet Take 81 mg by mouth daily.  Marland Kitchen  CALCIUM 1000 + D  Take 1 tablet by mouth daily.  Marland Kitchen VITAMIN D 2000 UNITS tablet Take 2,000 U 2  times daily.  Marland Kitchen DILT-XR 180 MG 24 hr capsule TAKE 1 CAPSULE BY MOUTH EVERY DAY  . fexofenadine (ALLEGRA) 180 MG tablet Take 180 mg by mouth daily.  . fluticasone (VERAMYST) 27.5 MCG/SPRAY nasal spray Place 2 sprays into the nose daily as needed for rhinitis.  . hydrochlorothiazide  25 MG tablet TAKE 1 TABLET BY MOUTH EVERY DAY FOR FLUID  . IRON PO Take 50 mg by mouth daily.  Marland Kitchen labetalol (NORMODYNE) 100 MG tablet TAKE 1&1/2 TABLETS BY MOUTH TWICE DAILY  . losartan (COZAAR) 50 MG tablet TAKE 1/2 Tablet daily  . Magnesium 400 MG CAPS Take by mouth daily.  . Multiple Vitamins-Minerals  Take 1 tablet by mouth daily.  . nitroGLYCERIN  0.4  MG SL tabl  as needed for chest pain.  . pantoprazole (PROTONIX) 40 MG tablet TAKE 1 TABLET BY MOUTH TWICE A DAY 1/2 HOUR BEFORE BREAKFAST AND DINNER  . traMADol (ULTRAM) 50 MG tablet Take 1 tablet (50 mg total) by mouth every 6 (six) hours as needed.  . TURMERIC PO Take 900 mg by mouth daily.   . vitamin C  500 MG tablet Take 500 mg by mouth daily.  Marland Kitchen VYTORIN 10-20 MG per tablet TAKE 1 TABLET BY MOUTH DAILY   Allergies  Allergen Reactions  . Altace [Ramipril]     cough  . Azithromycin     palpitation  . Celebrex [Celecoxib]     Gi upset   . Hydrochlorothiazide     cramping  . 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 Prilosec [Omeprazole]     constipation   Past Medical History  Diagnosis Date  . Hypertension   . Hyperlipidemia   . GERD (gastroesophageal reflux disease)   . COPD (chronic obstructive pulmonary disease)   . Anemia   . Fibromyalgia   . Unspecified vitamin D deficiency   . Raynaud disease   . Scoliosis   . IBS (irritable bowel syndrome)   . ASHD (arteriosclerotic heart disease)    Health Maintenance  Topic Date Due  . MAMMOGRAM  10/09/1992  . INFLUENZA VACCINE  09/29/2013  . TETANUS/TDAP  03/27/2022  . COLONOSCOPY  07/22/2022  . DEXA SCAN  Completed  . PNEUMOCOCCAL POLYSACCHARIDE VACCINE AGE 60 AND OVER  Completed  . ZOSTAVAX  Completed   Immunization History  Administered Date(s) Administered  . Pneumococcal-Unspecified 03/01/1998, 03/12/2010  . Td 03/27/2012  . Zoster 03/02/2007   Past Surgical History  Procedure Laterality Date  . Tonsillectomy    . Appendectomy    . Abdominal hysterectomy    . Tubal ligation    . Coronary artery bypass graft    . Coronary angioplasty  12/11  . Esophagogastroduodenoscopy  07/21/12    polyps, Hiatal hernia Dr. Matthias Hughs   Family History  Problem Relation Age of Onset  . Heart disease Brother   . Hyperlipidemia Brother    History  Substance Use Topics  . Smoking status:  Former Smoker    Start date: 02/01/1963  . Smokeless tobacco: Not on file  . Alcohol Use: 1.5 oz/week    3 drink(s) per week     Comment: rare    ROS Constitutional: Denies fever, chills, weight loss/gain, headaches, insomnia, fatigue, night sweats, and change in appetite. Eyes: Denies redness, blurred vision, diplopia, discharge, itchy, watery eyes.  ENT: Denies discharge, congestion, post nasal drip, epistaxis, sore throat, earache, hearing loss, dental pain, Tinnitus, Vertigo, Sinus pain, snoring.  Cardio: Denies chest pain, palpitations, irregular heartbeat, syncope, dyspnea, diaphoresis, orthopnea, PND, claudication, edema Respiratory: denies cough, dyspnea, DOE, pleurisy, hoarseness, laryngitis, wheezing.  Gastrointestinal: Denies dysphagia, heartburn, reflux, water brash, pain, cramps, nausea, vomiting, bloating, diarrhea, constipation, hematemesis, melena, hematochezia, jaundice, hemorrhoids Genitourinary: Denies dysuria, frequency, urgency, nocturia, hesitancy, discharge, hematuria, flank pain Breast: Breast lumps, nipple discharge, bleeding.  Musculoskeletal: Denies arthralgia, myalgia, stiffness, Jt. Swelling, pain, limp, and strain/sprain. Denies falls. Skin: Denies puritis, rash, hives, warts, acne, eczema, changing in skin lesion Neuro: No weakness, tremor, incoordination, spasms, paresthesia, pain Psychiatric: Denies confusion, memory loss, sensory loss. Denies Depression. Endocrine: Denies change in weight, skin, hair change, nocturia, and paresthesia, diabetic polys, visual blurring, hyper / hypo glycemic episodes.  Heme/Lymph: No excessive bleeding, bruising, enlarged lymph nodes.  Physical Exam  BP 110/62   Pulse 56  Temp97.9 F   Resp 16  Ht 5' 4.5"   Wt 140 lb 3.2 oz    BMI 23.70   General Appearance: Well nourished and in no apparent distress. Eyes: PERRLA, EOMs, conjunctiva no swelling or erythema, normal fundi and vessels. Sinuses: No frontal/maxillary  tenderness ENT/Mouth: EACs patent / TMs  nl. Nares clear without erythema, swelling, mucoid exudates. Oral hygiene is good. No erythema, swelling, or exudate. Tongue normal, non-obstructing. Tonsils not swollen or erythematous. Hearing normal.  Neck: Supple, thyroid normal. No bruits, nodes or JVD. Respiratory: Respiratory effort normal.  BS equal and clear bilateral without rales, rhonci, wheezing or stridor. Cardio: Heart sounds are normal with regular rate and rhythm and no murmurs, rubs or gallops. Peripheral pulses are normal and equal bilaterally without edema. No aortic or femoral bruits. Chest: symmetric with normal excursions and percussion. Breasts: Symmetric, without lumps, nipple discharge, retractions, or fibrocystic changes.  Abdomen: Flat, soft, with bowl sounds. Nontender, no guarding, rebound, hernias, masses, or organomegaly.  Lymphatics: Non tender without lymphadenopathy.  Genitourinary:  Musculoskeletal: Full ROM all peripheral extremities, joint stability, 5/5 strength, and normal gait. Skin: Warm and dry without rashes, lesions, cyanosis, clubbing or  ecchymosis.  Neuro: Cranial nerves intact, reflexes equal bilaterally. Normal muscle tone, no cerebellar symptoms. Sensation intact.  Pysch: Awake and oriented X 3, normal affect, Insight and  Judgment appropriate.   Assessment and Plan  1. Essential hypertension - Microalbumin / creatinine urine ratio - EKG 12-Lead - US, RETROPERITNL ABD,  LTD  2. ASCAD s/p CABG   3. Mixed hyperlipidemia  - Lipid panel  4. Prediabetes  - Hemoglobin A1c - Insulin, fasting  5. Vitamin D deficiency  - Vit D  25 hydroxy (rtn osteoporosis monitoring)  6. Gastroesophageal reflux disease, esophagitis presence not specified   7. Other fatigue  - Iron and TIBC - TSH  8. Depression screen  - Neg screening  9. Screening for rectal cancer  - POC Hemoccult Bld/Stl (3-Cd Home Screen); Future  10. At low risk for  fall   11. Medication management  - Urine Microscopic - CBC with Differential/Platelet - BASIC METABOLIC PANEL WITH GFR - Hepatic function panel - Magnesium   Continue prudent diet as discussed, weight control, BP monitoring, regular exercise, and medications. Discussed med's effects and SE's. Screening labs and tests as requested with regular follow-up as recommended.

## 2014-04-13 LAB — LIPID PANEL
Cholesterol: 149 mg/dL (ref 0–200)
HDL: 65 mg/dL (ref >39)
LDL Cholesterol: 74 mg/dL (ref 0–99)
Total CHOL/HDL Ratio: 2.3 Ratio
Triglycerides: 51 mg/dL (ref <150)
VLDL: 10 mg/dL (ref 0–40)

## 2014-04-13 LAB — CBC WITH DIFFERENTIAL/PLATELET
Basophils Absolute: 0 10*3/uL (ref 0.0–0.1)
Basophils Relative: 1 % (ref 0–1)
Eosinophils Absolute: 0.1 10*3/uL (ref 0.0–0.7)
Eosinophils Relative: 3 % (ref 0–5)
HCT: 39 % (ref 36.0–46.0)
Hemoglobin: 12.9 g/dL (ref 12.0–15.0)
Lymphocytes Relative: 23 % (ref 12–46)
Lymphs Abs: 1.1 10*3/uL (ref 0.7–4.0)
MCH: 30.1 pg (ref 26.0–34.0)
MCHC: 33.1 g/dL (ref 30.0–36.0)
MCV: 91.1 fL (ref 78.0–100.0)
MPV: 9.2 fL (ref 8.6–12.4)
Monocytes Absolute: 0.4 10*3/uL (ref 0.1–1.0)
Monocytes Relative: 9 % (ref 3–12)
Neutro Abs: 3 10*3/uL (ref 1.7–7.7)
Neutrophils Relative %: 64 % (ref 43–77)
Platelets: 206 10*3/uL (ref 150–400)
RBC: 4.28 MIL/uL (ref 3.87–5.11)
RDW: 13.3 % (ref 11.5–15.5)
WBC: 4.7 10*3/uL (ref 4.0–10.5)

## 2014-04-13 LAB — URINALYSIS, MICROSCOPIC ONLY: Crystals: NONE SEEN

## 2014-04-13 LAB — BASIC METABOLIC PANEL WITH GFR
BUN: 16 mg/dL (ref 6–23)
CO2: 27 mEq/L (ref 19–32)
Calcium: 9.1 mg/dL (ref 8.4–10.5)
Chloride: 102 mEq/L (ref 96–112)
Creat: 0.69 mg/dL (ref 0.50–1.10)
GFR, Est African American: >89 mL/min
GFR, Est Non African American: 88 mL/min
Glucose, Bld: 108 mg/dL — ABNORMAL HIGH (ref 70–99)
Potassium: 3.8 mEq/L (ref 3.5–5.3)
Sodium: 140 mEq/L (ref 135–145)

## 2014-04-13 LAB — HEPATIC FUNCTION PANEL
ALT: 21 U/L (ref 0–35)
AST: 22 U/L (ref 0–37)
Albumin: 4.2 g/dL (ref 3.5–5.2)
Alkaline Phosphatase: 49 U/L (ref 39–117)
Bilirubin, Direct: 0.1 mg/dL (ref 0.0–0.3)
Indirect Bilirubin: 0.4 mg/dL (ref 0.2–1.2)
Total Bilirubin: 0.5 mg/dL (ref 0.2–1.2)
Total Protein: 6.3 g/dL (ref 6.0–8.3)

## 2014-04-13 LAB — IRON AND TIBC
%SAT: 44 % (ref 20–55)
Iron: 112 ug/dL (ref 42–145)
TIBC: 253 ug/dL (ref 250–470)
UIBC: 141 ug/dL (ref 125–400)

## 2014-04-13 LAB — TSH: TSH: 3.236 u[IU]/mL (ref 0.350–4.500)

## 2014-04-13 LAB — MICROALBUMIN / CREATININE URINE RATIO
Creatinine, Urine: 93.6 mg/dL
Microalb Creat Ratio: 3.2 mg/g (ref 0.0–30.0)
Microalb, Ur: 0.3 mg/dL (ref <2.0)

## 2014-04-13 LAB — INSULIN, FASTING: Insulin fasting, serum: 16.3 u[IU]/mL (ref 2.0–19.6)

## 2014-04-13 LAB — MAGNESIUM: Magnesium: 2.1 mg/dL (ref 1.5–2.5)

## 2014-04-13 LAB — HEMOGLOBIN A1C
Hgb A1c MFr Bld: 5.8 % — ABNORMAL HIGH (ref <5.7)
Mean Plasma Glucose: 120 mg/dL — ABNORMAL HIGH (ref <117)

## 2014-04-13 LAB — VITAMIN D 25 HYDROXY (VIT D DEFICIENCY, FRACTURES): Vit D, 25-Hydroxy: 68 ng/mL (ref 30–100)

## 2014-04-14 ENCOUNTER — Encounter: Payer: Self-pay | Admitting: Internal Medicine

## 2014-04-16 NOTE — Addendum Note (Signed)
Addended by: Valrie HartEVANS, Vannie Hilgert C on: 04/16/2014 08:05 AM   Modules accepted: Orders

## 2014-05-16 ENCOUNTER — Other Ambulatory Visit: Payer: Self-pay | Admitting: Internal Medicine

## 2014-05-16 DIAGNOSIS — E782 Mixed hyperlipidemia: Secondary | ICD-10-CM

## 2014-05-16 DIAGNOSIS — I10 Essential (primary) hypertension: Secondary | ICD-10-CM

## 2014-06-04 ENCOUNTER — Other Ambulatory Visit: Payer: Self-pay | Admitting: Internal Medicine

## 2014-06-04 DIAGNOSIS — I10 Essential (primary) hypertension: Secondary | ICD-10-CM

## 2014-07-01 ENCOUNTER — Other Ambulatory Visit: Payer: Self-pay | Admitting: Internal Medicine

## 2014-07-08 ENCOUNTER — Other Ambulatory Visit: Payer: Self-pay | Admitting: Emergency Medicine

## 2014-07-24 ENCOUNTER — Encounter: Payer: Self-pay | Admitting: Cardiovascular Disease

## 2014-07-31 ENCOUNTER — Ambulatory Visit (INDEPENDENT_AMBULATORY_CARE_PROVIDER_SITE_OTHER): Payer: Medicare PPO | Admitting: Physician Assistant

## 2014-07-31 VITALS — BP 110/68 | HR 60 | Temp 97.9°F | Resp 16 | Ht 64.0 in | Wt 143.0 lb

## 2014-07-31 DIAGNOSIS — Z0001 Encounter for general adult medical examination with abnormal findings: Secondary | ICD-10-CM

## 2014-07-31 DIAGNOSIS — E559 Vitamin D deficiency, unspecified: Secondary | ICD-10-CM

## 2014-07-31 DIAGNOSIS — R350 Frequency of micturition: Secondary | ICD-10-CM

## 2014-07-31 DIAGNOSIS — J449 Chronic obstructive pulmonary disease, unspecified: Secondary | ICD-10-CM

## 2014-07-31 DIAGNOSIS — R296 Repeated falls: Secondary | ICD-10-CM

## 2014-07-31 DIAGNOSIS — K219 Gastro-esophageal reflux disease without esophagitis: Secondary | ICD-10-CM

## 2014-07-31 DIAGNOSIS — R6889 Other general symptoms and signs: Secondary | ICD-10-CM

## 2014-07-31 DIAGNOSIS — Z9181 History of falling: Secondary | ICD-10-CM

## 2014-07-31 DIAGNOSIS — Z1331 Encounter for screening for depression: Secondary | ICD-10-CM

## 2014-07-31 DIAGNOSIS — D649 Anemia, unspecified: Secondary | ICD-10-CM

## 2014-07-31 DIAGNOSIS — I10 Essential (primary) hypertension: Secondary | ICD-10-CM

## 2014-07-31 DIAGNOSIS — R0683 Snoring: Secondary | ICD-10-CM

## 2014-07-31 DIAGNOSIS — E782 Mixed hyperlipidemia: Secondary | ICD-10-CM

## 2014-07-31 DIAGNOSIS — E2839 Other primary ovarian failure: Secondary | ICD-10-CM

## 2014-07-31 DIAGNOSIS — I341 Nonrheumatic mitral (valve) prolapse: Secondary | ICD-10-CM

## 2014-07-31 DIAGNOSIS — I2583 Coronary atherosclerosis due to lipid rich plaque: Secondary | ICD-10-CM

## 2014-07-31 DIAGNOSIS — I7300 Raynaud's syndrome without gangrene: Secondary | ICD-10-CM

## 2014-07-31 DIAGNOSIS — Z79899 Other long term (current) drug therapy: Secondary | ICD-10-CM

## 2014-07-31 DIAGNOSIS — I2510 Atherosclerotic heart disease of native coronary artery without angina pectoris: Secondary | ICD-10-CM

## 2014-07-31 DIAGNOSIS — R7303 Prediabetes: Secondary | ICD-10-CM

## 2014-07-31 LAB — HEMOGLOBIN A1C
Hgb A1c MFr Bld: 6 % — ABNORMAL HIGH (ref <5.7)
Mean Plasma Glucose: 126 mg/dL — ABNORMAL HIGH (ref <117)

## 2014-07-31 LAB — CBC WITH DIFFERENTIAL/PLATELET
Basophils Absolute: 0.1 10*3/uL (ref 0.0–0.1)
Basophils Relative: 1 % (ref 0–1)
Eosinophils Absolute: 0.2 10*3/uL (ref 0.0–0.7)
Eosinophils Relative: 3 % (ref 0–5)
HCT: 37.6 % (ref 36.0–46.0)
Hemoglobin: 13 g/dL (ref 12.0–15.0)
Lymphocytes Relative: 21 % (ref 12–46)
Lymphs Abs: 1.1 10*3/uL (ref 0.7–4.0)
MCH: 30.9 pg (ref 26.0–34.0)
MCHC: 34.6 g/dL (ref 30.0–36.0)
MCV: 89.3 fL (ref 78.0–100.0)
MPV: 8.9 fL (ref 8.6–12.4)
Monocytes Absolute: 0.5 10*3/uL (ref 0.1–1.0)
Monocytes Relative: 10 % (ref 3–12)
Neutro Abs: 3.3 10*3/uL (ref 1.7–7.7)
Neutrophils Relative %: 65 % (ref 43–77)
Platelets: 208 10*3/uL (ref 150–400)
RBC: 4.21 MIL/uL (ref 3.87–5.11)
RDW: 13.5 % (ref 11.5–15.5)
WBC: 5.1 10*3/uL (ref 4.0–10.5)

## 2014-07-31 NOTE — Progress Notes (Signed)
Medicare wellness and 3 month follow up  Assessment:   1. Essential hypertension - continue medications, DASH diet, exercise and monitor at home. Call if greater than 130/80.  - CBC with Differential/Platelet - BASIC METABOLIC PANEL WITH GFR - Hepatic function panel - TSH  2. Coronary artery disease due to lipid rich plaque Control blood pressure, cholesterol, glucose, increase exercise.  Continue cardio follow up  3. Prediabetes Discussed general issues about diabetes pathophysiology and management., Educational material distributed., Suggested low cholesterol diet., Encouraged aerobic exercise., Discussed foot care., Reminded to get yearly retinal exam. - Hemoglobin A1c - Insulin, fasting - HM DIABETES FOOT EXAM  4. Mixed hyperlipidemia -continue medications, check lipids, decrease fatty foods, increase activity.  - Lipid panel  5. Chronic obstructive pulmonary disease, unspecified COPD, unspecified chronic bronchitis type Check CXR, may need PFTs  6. RAYNAUDS SYNDROME monitor  7.Estrogen deficiency - DG Bone Density; Future  8. MVP (mitral valve prolapse) Monitor, continue BB  9. Gastroesophageal reflux disease, esophagitis presence not specified GERD- will try to get off PPI given info for taper and zantac sent in  10. Vitamin D deficiency - Vit D  25 hydroxy (rtn osteoporosis monitoring)  11. Anemia, unspecified anemia type Check CBC  12. Medication management - Magnesium  13. Depression screen negative  14. At moderate risk for fall Get dexa  15. Snoring/SOB Recent normal stress test, no accompaniments/palpitations DDX: sleep apnea, COPD, GERD Add zantac, will get apnea link, may need to get PFTs - Ambulatory referral to Sleep Studies  16. Urinary frequency - Urinalysis, Routine w reflex microscopic (not at [facility]) - Urine culture   Plan:   During the course of the visit the patient was educated and counseled about appropriate screening and  preventive services including:    Pneumococcal vaccine   Influenza vaccine  Td vaccine  Screening electrocardiogram  Screening mammography  Bone densitometry screening  Colorectal cancer screening  Diabetes screening  Glaucoma screening  Nutrition counseling   Advanced directives: given information  Conditions/risks identified: BMI: Discussed weight loss, diet, and increase physical activity.  Increase physical activity: AHA recommends 150 minutes of physical activity a week.  Medications reviewed DEXA- due 2016 Diabetes is at goal, ACE/ARB therapy: Yes. Urinary Incontinence is not an issue: discussed non pharmacology and pharmacology options.  Fall risk: moderate- discussed PT, home fall assessment, medications.    Subjective:   Jasmine Reeves is a 74 y.o. female who presents for Medicare Annual Wellness Visit and 3 month follow up on hypertension, prediabetes, hyperlipidemia, vitamin D def.  Date of last medicare wellness visit was 07/12/2013  Her blood pressure has been controlled at home, today their BP is BP: 110/68 mmHg She does workout. She denies chest pain, shortness of breath, dizziness.  She has a history of ASHD S/P CABG x 4 in 2002. Normal stress test 08/2013. She follows with Dr. Tresa Endo. 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  She is on ASA.  Denies chest pain, exertional chest pressure/discomfort and palpitations.  She does complain of anxiety and shortness of breath, non exertional, no accompaniments, she does have GERD and has been under stress.  She also snores at night, has woken herself up before, and sleep on the recliner.  She is on cholesterol medication, vytorin 10/20 and denies myalgias. Her cholesterol is at goal. The cholesterol last visit was:   Lab Results  Component Value Date   CHOL 149 04/12/2014   HDL 65 04/12/2014   LDLCALC  74 04/12/2014   TRIG 51 04/12/2014   CHOLHDL 2.3 04/12/2014   She has been working on diet and  exercise for prediabetes, and denies paresthesia of the feet, polydipsia and polyuria. Last A1C in the office was:  Lab Results  Component Value Date   HGBA1C 5.8* 04/12/2014   Patient is on Vitamin D supplement. Husband diagnosed with parkinson's and son with rectal cancer and has had reoccurrence in a lymph node, had recent surgery. She states she sleeps well for the most part, sleeps on a couch. Nocturia x 1.   Names of Other Physician/Practitioners you currently use: 1. [facility]- here for primary care 2. [place], eye doctor, last visit 03/2014 3.  Dr. Vincente [facility],2016, 2 x a year  Patient Care Team: [name], MD as PCP - General (Internal Medicine) Lennette Bihari, MD as Consulting Physician (Cardiology) Bernette Redbird, MD as Consulting Physician (Gastroenterology) Freddy Finner, MD as Consulting Physician (Obstetrics and Gynecology)  Medication Review Current Outpatient Prescriptions on File Prior to Visit  Medication Sig Dispense Refill  . aspirin 81 MG tablet Take 81 mg by mouth daily.    . Calcium Carb-Cholecalciferol (CALCIUM 1000 + D PO) Take 1 tablet by mouth daily.    . Cholecalciferol (VITAMIN D) 2000 UNITS tablet Take 2,000 Units by mouth 2 (two) times daily.    Marland Kitchen DILT-XR 180 MG 24 hr capsule TAKE ONE CAPSULE BY MOUTH DAILY 90 capsule 1  . fexofenadine (ALLEGRA) 180 MG tablet Take 180 mg by mouth daily.    . fluticasone (VERAMYST) 27.5 MCG/SPRAY nasal spray Place 2 sprays into the nose daily as needed for rhinitis. 10 g 8  . FLUZONE HIGH-DOSE 0.5 ML SUSY   0  . hydrochlorothiazide (HYDRODIURIL) 25 MG tablet TAKE 1 TABLET BY MOUTH EVERY DAY FOR FLUID 90 tablet 1  . IRON PO Take 50 mg by mouth daily.    Marland Kitchen labetalol (NORMODYNE) 100 MG tablet TAKE 1 AND 1/2 TABLETS BY MOUTH TWICE DAILY 270 tablet 1  . losartan (COZAAR) 50 MG tablet TAKE 1/2 TO 1 TABLET BY MOUTH EVERY DAY FOR BLOOD PRESSURE 90 tablet 1  . Magnesium 400 MG CAPS  Take by mouth daily.    . meloxicam (MOBIC) 15 MG tablet   2  . Multiple Vitamins-Minerals (MULTIVITAMIN WITH MINERALS) tablet Take 1 tablet by mouth daily.    . nitroGLYCERIN (NITROSTAT) 0.4 MG SL tablet Place 1 tablet (0.4 mg total) under the tongue every 5 (five) minutes as needed for chest pain. 25 tablet 12  . pantoprazole (PROTONIX) 40 MG tablet TAKE 1 TABLET BY MOUTH TWICE DAILY 1/2 HOURS BEFORE BREAKFAST AND DINNER 180 tablet 0  . TURMERIC PO Take 900 mg by mouth daily.     . vitamin C (ASCORBIC ACID) 500 MG tablet Take 500 mg by mouth daily.    Marland Kitchen VYTORIN 10-20 MG per tablet TAKE 1 TABLET BY MOUTH EVERY DAY 90 tablet 1   No current facility-administered medications on file prior to visit.    Current Problems (verified) Patient Active Problem List   Diagnosis Date Noted  . Medication management 04/12/2014  . Prediabetes 10/10/2013  . Mixed hyperlipidemia 10/08/2013  . CAD (coronary artery disease) 02/03/2013  . MVP (mitral valve prolapse) 02/03/2013  . Vitamin D deficiency   . GERD (gastroesophageal reflux disease)   . Anemia   . COPD (chronic obstructive pulmonary disease) 02/11/2010  . Essential hypertension 01/28/2010  . RAYNAUDS SYNDROME 01/28/2010  .  Acute thromboembolism of deep veins of lower extremity 01/28/2010    Screening Tests Health Maintenance  Topic Date Due  . MAMMOGRAM  10/09/1992  . INFLUENZA VACCINE  09/30/2014  . TETANUS/TDAP  03/27/2022  . COLONOSCOPY  07/22/2022  . DEXA SCAN  Completed  . ZOSTAVAX  Completed  . PNA vac Low Risk Adult  Completed     Immunization History  Administered Date(s) Administered  . Pneumococcal Conjugate-13 04/16/2014  . Pneumococcal-Unspecified 03/01/1998, 03/12/2010  . Td 03/27/2012  . Zoster 03/02/2007    Preventative care: Last colonoscopy: 06/2012 Last mammogram: 05/2013 Last pap smear/pelvic exam: 2014 (Dr. Jennette KettleNeal)  DEXA: 03/2012 DUE this year CXR 09/2013 Stress test 08/2013 Echo 2012 Cath 2008  Prior  vaccinations: TD or Tdap: 2014  Influenza: 2014 Pneumococcal: 2012 Prevnar 13: 2016 Shingles/Zostavax: 2009   Allergies Allergies  Allergen Reactions  . Altace [Ramipril]     cough  . Azithromycin     palpitation  . Celebrex [Celecoxib]     Gi upset   . Hydrochlorothiazide     cramping  . 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 Prilosec [Omeprazole]     constipation   Surgical history Past Surgical History  Procedure Laterality Date  . Tonsillectomy    . Appendectomy    . Abdominal hysterectomy    . Tubal ligation    . Coronary artery bypass graft    . Coronary angioplasty  12/11  . Esophagogastroduodenoscopy  07/21/12    polyps, Hiatal hernia Dr. Matthias HughsBuccini   Family history Family History  Problem Relation Age of Onset  . Heart disease Brother   . Hyperlipidemia Brother    Risk Factors: Osteoporosis: postmenopausal estrogen deficiency and dietary calcium and/or vitamin D deficiency History of fracture in the past year: YES fall 09/2013 with rib fractures.   Tobacco History  Substance Use Topics  . Smoking status: Former Smoker    Start date: 02/01/1963  . Smokeless tobacco: Not on file  . Alcohol Use: 1.5 oz/week    3 drink(s) per week     Comment: rare   She does not smoke.  Patient is a former smoker. Are there smokers in your home (other than you)?  No  Alcohol Current alcohol use: social drinker  Caffeine Current caffeine use: coffee 1-2 /day  Exercise Exercise limitations: The patient has no exercise limitations. Current exercise: housecleaning  Nutrition/Diet Current diet: in general, a "healthy" diet    Cardiac risk factors: advanced age (older than 3055 for men, 5865 for women), diabetes mellitus, dyslipidemia, hypertension and sedentary lifestyle.  Depression Screen  Q1: Over the past two weeks, have you felt down, depressed or hopeless? No  Q2: Over the past two weeks, have you felt little interest or  pleasure in doing things? No  Have you lost interest or pleasure in daily life? No  Do you often feel hopeless? No  Do you cry easily over simple problems? No  Activities of Daily Living In your present state of health, do you have any difficulty performing the following activities?:  Driving? No Managing money?  No Feeding yourself? No Getting from bed to chair? No Climbing a flight of stairs? No Preparing food and eating?: No Bathing or showering? No Getting dressed: No Getting to the toilet? No Using the toilet:No Moving around from place to place: No In the past year have you fallen or had a near fall?:YES 10/08/2013   Are you sexually active?  No  Do you have more than one partner?  No  Vision Difficulties: No  Hearing Difficulties: No Do you often ask people to speak up or repeat themselves? No Do you experience ringing or noises in your ears? No Do you have difficulty understanding soft or whispered voices? No  Cognition  Do you feel that you have a problem with memory?No  Do you often misplace items? No  Do you feel safe at home?  Yes  Advanced directives Does patient have a Health Care Power of Attorney? yes Does patient have a Living Will? yes   Objective:   Blood pressure 110/68, pulse 60, temperature 97.9 F (36.6 C), resp. rate 16, height  (1.626 m), weight 143 lb (64.864 kg). Body mass index is 24.53 kg/(m^2).  General appearance: alert, no distress, WD/WN,  female Cognitive Testing  Alert? Yes  Normal Appearance?Yes  Oriented to person? Yes  Place? Yes   Time? Yes  Recall of three objects?  Yes  Can perform simple calculations? Yes  Displays appropriate judgment?Yes  Can read the correct time from a watch face?Yes  HEENT: normocephalic, sclerae anicteric, TMs pearly, nares patent, no discharge or erythema, pharynx normal Oral cavity: MMM, no lesions Neck: supple, no lymphadenopathy, no thyromegaly, no masses Heart: RRR, normal S1, S2, no  murmurs Lungs: CTA bilaterally, no wheezes, rhonchi, or rales Abdomen: +bs, soft, non tender, non distended, no masses, no hepatomegaly, no splenomegaly Musculoskeletal: right subacromial bursa tenderness, full ROM, no swelling, no obvious deformity Extremities: no edema, no cyanosis, no clubbing Pulses: 2+ symmetric, upper and lower extremities, normal cap refill Neurological: alert, oriented x 3, CN2-12 intact, strength normal upper extremities and lower extremities, sensation normal throughout, DTRs 2+ throughout, no cerebellar signs, gait normal Psychiatric: normal affect, behavior normal, pleasant  Breast: defer Gyn: defer Rectal: defer  Medicare Attestation I have personally reviewed: The patient's medical and social history Their use of alcohol, tobacco or illicit drugs Their current medications and supplements The patient's functional ability including ADLs,fall risks, home safety risks, cognitive, and hearing and visual impairment Diet and physical activities Evidence for depression or mood disorders  The patient's weight, height, BMI, and visual acuity have been recorded in the chart.  I have made referrals, counseling, and provided education to the patient based on review of the above and I have provided the patient with a written personalized care plan for preventive services.     Quentin Mulling, PA-C   07/31/2014

## 2014-07-31 NOTE — Patient Instructions (Signed)
I think it is possible that you have sleep apnea. It can cause interrupted sleep, headaches, frequent awakenings, fatigue, dry mouth, fast/slow heart beats, memory issues, anxiety/depression, swelling, numbness tingling hands/feet, weight gain, shortness of breath, and the list goes on. Sleep apnea needs to be ruled out because if it is left untreated it does eventually lead to abnormal heart beats, lung failure or heart failure as well as increasing the risk of heart attack and stroke. There are masks you can wear OR a mouth piece that I can give you information about. Often times though people feel MUCH better after getting treatment.   Sleep Apnea  Sleep apnea is a sleep disorder characterized by abnormal pauses in breathing while you sleep. When your breathing pauses, the level of oxygen in your blood decreases. This causes you to move out of deep sleep and into light sleep. As a result, your quality of sleep is poor, and the system that carries your blood throughout your body (cardiovascular system) experiences stress. If sleep apnea remains untreated, the following conditions can develop:  High blood pressure (hypertension).  Coronary artery disease.  Inability to achieve or maintain an erection (impotence).  Impairment of your thought process (cognitive dysfunction). There are three types of sleep apnea: 1. Obstructive sleep apnea--Pauses in breathing during sleep because of a blocked airway. 2. Central sleep apnea--Pauses in breathing during sleep because the area of the brain that controls your breathing does not send the correct signals to the muscles that control breathing. 3. Mixed sleep apnea--A combination of both obstructive and central sleep apnea.  RISK FACTORS The following risk factors can increase your risk of developing sleep apnea:  Being overweight.  Smoking.  Having narrow passages in your nose and throat.  Being of older age.  Being female.  Alcohol use.   Sedative and tranquilizer use.  Ethnicity. Among individuals younger than 35 years, African Americans are at increased risk of sleep apnea. SYMPTOMS   Difficulty staying asleep.  Daytime sleepiness and fatigue.  Loss of energy.  Irritability.  Loud, heavy snoring.  Morning headaches.  Trouble concentrating.  Forgetfulness.  Decreased interest in sex. DIAGNOSIS  In order to diagnose sleep apnea, your caregiver will perform a physical examination. Your caregiver may suggest that you take a home sleep test. Your caregiver may also recommend that you spend the night in a sleep lab. In the sleep lab, several monitors record information about your heart, lungs, and brain while you sleep. Your leg and arm movements and blood oxygen level are also recorded. TREATMENT The following actions may help to resolve mild sleep apnea:  Sleeping on your side.   Using a decongestant if you have nasal congestion.   Avoiding the use of depressants, including alcohol, sedatives, and narcotics.   Losing weight and modifying your diet if you are overweight. There also are devices and treatments to help open your airway:  Oral appliances. These are custom-made mouthpieces that shift your lower jaw forward and slightly open your bite. This opens your airway.  Devices that create positive airway pressure. This positive pressure "splints" your airway open to help you breathe better during sleep. The following devices create positive airway pressure:  Continuous positive airway pressure (CPAP) device. The CPAP device creates a continuous level of air pressure with an air pump. The air is delivered to your airway through a mask while you sleep. This continuous pressure keeps your airway open.  Nasal expiratory positive airway pressure (EPAP) device. The EPAP device   creates positive air pressure as you exhale. The device consists of single-use valves, which are inserted into each nostril and held in  place by adhesive. The valves create very little resistance when you inhale but create much more resistance when you exhale. That increased resistance creates the positive airway pressure. This positive pressure while you exhale keeps your airway open, making it easier to breath when you inhale again.  Bilevel positive airway pressure (BPAP) device. The BPAP device is used mainly in patients with central sleep apnea. This device is similar to the CPAP device because it also uses an air pump to deliver continuous air pressure through a mask. However, with the BPAP machine, the pressure is set at two different levels. The pressure when you exhale is lower than the pressure when you inhale.  Surgery. Typically, surgery is only done if you cannot comply with less invasive treatments or if the less invasive treatments do not improve your condition. Surgery involves removing excess tissue in your airway to create a wider passage way. Document Released: 02/05/2002 Document Revised: 06/12/2012 Document Reviewed: 06/24/2011 [facility] Patient Information 2015 [place], [place]. This information is not intended to replace advice given to you by your health care provider. Make sure you discuss any questions you have with your health care provider.      GETTING OFF OF PPI's    Nexium/protonix/prilosec/Omeprazole/Dexilant/Aciphex are called PPI's, they are great at healing your stomach but should only be taken for a short period of time.     Recent studies have shown that taken for a long time they  can increase the risk of osteoporosis (weakening of your bones), pneumonia, low magnesium, restless legs, Cdiff (infection that causes diarrhea), DEMENTIA and most recently kidney damage / disease / insufficiency.     Due to this information we want to try to stop the PPI but if you try to stop it abruptly this can cause rebound acid and worsening symptoms.   So this is how we want you to get off the PPI:  - Start  taking the nexium/protonix/prilosec/PPI  every other day with  zantac (ranitidine) 2 x a day for 2-4 weeks  - then decrease the PPI to every 3 days while taking the zantac (ranitidine) twice a day the other  days for 2-4  Weeks  - then you can try the zantac (ranitidine) once at night or up to 2 x day as needed.  - you can continue on this once at night or stop all together  - Avoid alcohol, spicy foods, NSAIDS (aleve, ibuprofen) at this time. See foods below.   +++++++++++++++++++++++++++++++++++++++++++  Food Choices for Gastroesophageal Reflux Disease  When you have gastroesophageal reflux disease (GERD), the foods you eat and your eating habits are very important. Choosing the right foods can help ease the discomfort of GERD. WHAT GENERAL GUIDELINES DO I NEED TO FOLLOW?  Choose fruits, vegetables, whole grains, low-fat dairy products, and low-fat meat, fish, and poultry.  Limit fats such as oils, salad dressings, butter, nuts, and avocado.  Keep a food diary to identify foods that cause symptoms.  Avoid foods that cause reflux. These may be different for different people.  Eat frequent small meals instead of three large meals each day.  Eat your meals slowly, in a relaxed setting.  Limit fried foods.  Cook foods using methods other than frying.  Avoid drinking alcohol.  Avoid drinking large amounts of liquids with your meals.  Avoid bending over or lying down until 2-3  hours after eating.   WHAT FOODS ARE NOT RECOMMENDED? The following are some foods and drinks that may worsen your symptoms:  Vegetables Tomatoes. Tomato juice. Tomato and spaghetti sauce. Chili peppers. Onion and garlic. Horseradish. Fruits Oranges, grapefruit, and lemon (fruit and juice). Meats High-fat meats, fish, and poultry. This includes hot dogs, ribs, ham, sausage, salami, and bacon. Dairy Whole milk and chocolate milk. Sour cream. Cream. Butter. Ice cream. Cream cheese.   Beverages Coffee and tea, with or without caffeine. Carbonated beverages or energy drinks. Condiments Hot sauce. Barbecue sauce.  Sweets/Desserts Chocolate and cocoa. Donuts. Peppermint and spearmint. Fats and Oils High-fat foods, including [place] fries and potato chips. Other Vinegar. Strong spices, such as black pepper, white pepper, red pepper, cayenne, curry powder, cloves, ginger, and chili powder. Nexium/protonix/prilosec are called PPI's, they are great at healing your stomach but should only be taken for a short period of time.   Preventive Care for Adults A healthy lifestyle and preventive care can promote health and wellness. Preventive health guidelines for women include the following key practices.  A routine yearly physical is a good way to check with your health care provider about your health and preventive screening. It is a chance to share any concerns and updates on your health and to receive a thorough exam.  Visit your dentist for a routine exam and preventive care every 6 months. Brush your teeth twice a day and floss once a day. Good oral hygiene prevents tooth decay and gum disease.  The frequency of eye exams is based on your age, health, family medical history, use of contact lenses, and other factors. Follow your health care provider's recommendations for frequency of eye exams.  Eat a healthy diet. Foods like vegetables, fruits, whole grains, low-fat dairy products, and lean protein foods contain the nutrients you need without too many calories. Decrease your intake of foods high in solid fats, added sugars, and salt. Eat the right amount of calories for you.Get information about a proper diet from your health care provider, if necessary.  Regular physical exercise is one of the most important things you can do for your health. Most adults should get at least 150 minutes of moderate-intensity exercise (any activity that increases your heart rate and causes you to  sweat) each week. In addition, most adults need muscle-strengthening exercises on 2 or more days a week.  Maintain a healthy weight. The body mass index (BMI) is a screening tool to identify possible weight problems. It provides an estimate of body fat based on height and weight. Your health care provider can find your BMI and can help you achieve or maintain a healthy weight.For adults 20 years and older:  A BMI below 18.5 is considered underweight.  A BMI of 18.5 to 24.9 is normal.  A BMI of 25 to 29.9 is considered overweight.  A BMI of 30 and above is considered obese.  Maintain normal blood lipids and cholesterol levels by exercising and minimizing your intake of saturated fat. Eat a balanced diet with plenty of fruit and vegetables. If your lipid or cholesterol levels are high, you are over 50, or you are at high risk for heart disease, you may need your cholesterol levels checked more frequently.Ongoing high lipid and cholesterol levels should be treated with medicines if diet and exercise are not working.  If you smoke, find out from your health care provider how to quit. If you do not use tobacco, do not start.  Lung cancer  screening is recommended for adults aged 50-80 years who are at high risk for developing lung cancer because of a history of smoking. A yearly low-dose CT scan of the lungs is recommended for people who have at least a 30-pack-year history of smoking and are a current smoker or have quit within the past 15 years. A pack year of smoking is smoking an average of 1 pack of cigarettes a day for 1 year (for example: 1 pack a day for 30 years or 2 packs a day for 15 years). Yearly screening should continue until the smoker has stopped smoking for at least 15 years. Yearly screening should be stopped for people who develop a health problem that would prevent them from having lung cancer treatment.  Avoid use of street drugs. Do not share needles with anyone. Ask for help if  you need support or instructions about stopping the use of drugs.  High blood pressure causes heart disease and increases the risk of stroke.  Ongoing high blood pressure should be treated with medicines if weight loss and exercise do not work.  If you are 27-3 years old, ask your health care provider if you should take aspirin to prevent strokes.  Diabetes screening involves taking a blood sample to check your fasting blood sugar level. This should be done once every 3 years, after age 26, if you are within normal weight and without risk factors for diabetes. Testing should be considered at a younger age or be carried out more frequently if you are overweight and have at least 1 risk factor for diabetes.  Breast cancer screening is essential preventive care for women. You should practice "breast self-awareness." This means understanding the normal appearance and feel of your breasts and may include breast self-examination. Any changes detected, no matter how small, should be reported to a health care provider. Women in their 57s and 30s should have a clinical breast exam (CBE) by a health care provider as part of a regular health exam every 1 to 3 years. After age 63, women should have a CBE every year. Starting at age 31, women should consider having a mammogram (breast X-ray test) every year. Women who have a family history of breast cancer should talk to their health care provider about genetic screening. Women at a high risk of breast cancer should talk to their health care providers about having an MRI and a mammogram every year.  Breast cancer gene (BRCA)-related cancer risk assessment is recommended for women who have family members with BRCA-related cancers. BRCA-related cancers include breast, ovarian, tubal, and peritoneal cancers. Having family members with these cancers may be associated with an increased risk for harmful changes (mutations) in the breast cancer genes BRCA1 and BRCA2. Results  of the assessment will determine the need for genetic counseling and BRCA1 and BRCA2 testing.  Routine pelvic exams to screen for cancer are no longer recommended for nonpregnant women who are considered low risk for cancer of the pelvic organs (ovaries, uterus, and vagina) and who do not have symptoms. Ask your health care provider if a screening pelvic exam is right for you.  If you have had past treatment for cervical cancer or a condition that could lead to cancer, you need Pap tests and screening for cancer for at least 20 years after your treatment. If Pap tests have been discontinued, your risk factors (such as having a new sexual partner) need to be reassessed to determine if screening should be resumed. Some women  have medical problems that increase the chance of getting cervical cancer. In these cases, your health care provider may recommend more frequent screening and Pap tests.    Colorectal cancer can be detected and often prevented. Most routine colorectal cancer screening begins at the age of 42 years and continues through age 7 years. However, your health care provider may recommend screening at an earlier age if you have risk factors for colon cancer. On a yearly basis, your health care provider may provide home test kits to check for hidden blood in the stool. Use of a small camera at the end of a tube, to directly examine the colon (sigmoidoscopy or colonoscopy), can detect the earliest forms of colorectal cancer. Talk to your health care provider about this at age 61, when routine screening begins. Direct exam of the colon should be repeated every 5-10 years through age 22 years, unless early forms of pre-cancerous polyps or small growths are found.  Osteoporosis is a disease in which the bones lose minerals and strength with aging. This can result in serious bone fractures or breaks. The risk of osteoporosis can be identified using a bone density scan. Women ages 49 years and over  and women at risk for fractures or osteoporosis should discuss screening with their health care providers. Ask your health care provider whether you should take a calcium supplement or vitamin D to reduce the rate of osteoporosis.  Menopause can be associated with physical symptoms and risks. Hormone replacement therapy is available to decrease symptoms and risks. You should talk to your health care provider about whether hormone replacement therapy is right for you.  Use sunscreen. Apply sunscreen liberally and repeatedly throughout the day. You should seek shade when your shadow is shorter than you. Protect yourself by wearing long sleeves, pants, a wide-brimmed hat, and sunglasses year round, whenever you are outdoors.  Once a month, do a whole body skin exam, using a mirror to look at the skin on your back. Tell your health care provider of new moles, moles that have irregular borders, moles that are larger than a pencil eraser, or moles that have changed in shape or color.  Stay current with required vaccines (immunizations).  Influenza vaccine. All adults should be immunized every year.  Tetanus, diphtheria, and acellular pertussis (Td, Tdap) vaccine. Pregnant women should receive 1 dose of Tdap vaccine during each pregnancy. The dose should be obtained regardless of the length of time since the last dose. Immunization is preferred during the 27th-36th week of gestation. An adult who has not previously received Tdap or who does not know her vaccine status should receive 1 dose of Tdap. This initial dose should be followed by tetanus and diphtheria toxoids (Td) booster doses every 10 years. Adults with an unknown or incomplete history of completing a 3-dose immunization series with Td-containing vaccines should begin or complete a primary immunization series including a Tdap dose. Adults should receive a Td booster every 10 years.    Zoster vaccine. One dose is recommended for adults aged 54  years or older unless certain conditions are present.    Pneumococcal 13-valent conjugate (PCV13) vaccine. When indicated, a person who is uncertain of her immunization history and has no record of immunization should receive the PCV13 vaccine. An adult aged 97 years or older who has certain medical conditions and has not been previously immunized should receive 1 dose of PCV13 vaccine. This PCV13 should be followed with a dose of pneumococcal polysaccharide (PPSV23) vaccine.  The PPSV23 vaccine dose should be obtained at least 8 weeks after the dose of PCV13 vaccine. An adult aged 29 years or older who has certain medical conditions and previously received 1 or more doses of PPSV23 vaccine should receive 1 dose of PCV13. The PCV13 vaccine dose should be obtained 1 or more years after the last PPSV23 vaccine dose.    Pneumococcal polysaccharide (PPSV23) vaccine. When PCV13 is also indicated, PCV13 should be obtained first. All adults aged 53 years and older should be immunized. An adult younger than age 15 years who has certain medical conditions should be immunized. Any person who resides in a nursing home or long-term care facility should be immunized. An adult smoker should be immunized. People with an immunocompromised condition and certain other conditions should receive both PCV13 and PPSV23 vaccines. People with human immunodeficiency virus (HIV) infection should be immunized as soon as possible after diagnosis. Immunization during chemotherapy or radiation therapy should be avoided. Routine use of PPSV23 vaccine is not recommended for American Indians, [place] Natives, or people younger than 65 years unless there are medical conditions that require PPSV23 vaccine. When indicated, people who have unknown immunization and have no record of immunization should receive PPSV23 vaccine. One-time revaccination 5 years after the first dose of PPSV23 is recommended for people aged 19-64 years who have chronic  kidney failure, nephrotic syndrome, asplenia, or immunocompromised conditions. People who received 1-2 doses of PPSV23 before age 65 years should receive another dose of PPSV23 vaccine at age 41 years or later if at least 5 years have passed since the previous dose. Doses of PPSV23 are not needed for people immunized with PPSV23 at or after age 76 years.   Preventive Services / Frequency  Ages 72 years and over  Blood pressure check.  Lipid and cholesterol check.  Lung cancer screening. / Every year if you are aged 89-80 years and have a 30-pack-year history of smoking and currently smoke or have quit within the past 15 years. Yearly screening is stopped once you have quit smoking for at least 15 years or develop a health problem that would prevent you from having lung cancer treatment.  Clinical breast exam.** / Every year after age 64 years.  BRCA-related cancer risk assessment.** / For women who have family members with a BRCA-related cancer (breast, ovarian, tubal, or peritoneal cancers).  Mammogram.** / Every year beginning at age 17 years and continuing for as long as you are in good health. Consult with your health care provider.  Pap test.** / Every 3 years starting at age 62 years through age 53 or 57 years with 3 consecutive normal Pap tests. Testing can be stopped between 65 and 70 years with 3 consecutive normal Pap tests and no abnormal Pap or HPV tests in the past 10 years.  Fecal occult blood test (FOBT) of stool. / Every year beginning at age 69 years and continuing until age 66 years. You may not need to do this test if you get a colonoscopy every 10 years.  Flexible sigmoidoscopy or colonoscopy.** / Every 5 years for a flexible sigmoidoscopy or every 10 years for a colonoscopy beginning at age 75 years and continuing until age 87 years.  Hepatitis C blood test.** / For all people born from [birth year] through 1963 and any individual with known risks for hepatitis  C.  Osteoporosis screening.** / A one-time screening for women ages 31 years and over and women at risk for fractures or osteoporosis.  Skin  self-exam. / Monthly.  Influenza vaccine. / Every year.  Tetanus, diphtheria, and acellular pertussis (Tdap/Td) vaccine.** / 1 dose of Td every 10 years.  Zoster vaccine.** / 1 dose for adults aged 10 years or older.  Pneumococcal 13-valent conjugate (PCV13) vaccine.** / Consult your health care provider.  Pneumococcal polysaccharide (PPSV23) vaccine.** / 1 dose for all adults aged 37 years and older. Screening for abdominal aortic aneurysm (AAA)  by ultrasound is recommended for people who have history of high blood pressure or who are current or former smokers.

## 2014-08-01 ENCOUNTER — Other Ambulatory Visit: Payer: Self-pay | Admitting: Physician Assistant

## 2014-08-01 DIAGNOSIS — Z1231 Encounter for screening mammogram for malignant neoplasm of breast: Secondary | ICD-10-CM

## 2014-08-01 LAB — URINALYSIS, ROUTINE W REFLEX MICROSCOPIC
Bilirubin Urine: NEGATIVE
Glucose, UA: NEGATIVE mg/dL
Hgb urine dipstick: NEGATIVE
Ketones, ur: NEGATIVE mg/dL
Nitrite: NEGATIVE
Protein, ur: NEGATIVE mg/dL
Specific Gravity, Urine: 1.008 (ref 1.005–1.030)
Urobilinogen, UA: 0.2 mg/dL (ref 0.0–1.0)
pH: 6.5 (ref 5.0–8.0)

## 2014-08-01 LAB — LIPID PANEL
Cholesterol: 146 mg/dL (ref 0–200)
HDL: 71 mg/dL (ref >=46)
LDL Cholesterol: 62 mg/dL (ref 0–99)
Total CHOL/HDL Ratio: 2.1 Ratio
Triglycerides: 63 mg/dL (ref <150)
VLDL: 13 mg/dL (ref 0–40)

## 2014-08-01 LAB — URINALYSIS, MICROSCOPIC ONLY
Bacteria, UA: NONE SEEN
Casts: NONE SEEN
Crystals: NONE SEEN

## 2014-08-01 LAB — BASIC METABOLIC PANEL WITH GFR
BUN: 13 mg/dL (ref 6–23)
CO2: 27 mEq/L (ref 19–32)
Calcium: 8.9 mg/dL (ref 8.4–10.5)
Chloride: 104 mEq/L (ref 96–112)
Creat: 0.64 mg/dL (ref 0.50–1.10)
GFR, Est African American: >89 mL/min
GFR, Est Non African American: >89 mL/min
Glucose, Bld: 91 mg/dL (ref 70–99)
Potassium: 4.2 mEq/L (ref 3.5–5.3)
Sodium: 139 mEq/L (ref 135–145)

## 2014-08-01 LAB — HEPATIC FUNCTION PANEL
ALT: 25 U/L (ref 0–35)
AST: 24 U/L (ref 0–37)
Albumin: 4.4 g/dL (ref 3.5–5.2)
Alkaline Phosphatase: 53 U/L (ref 39–117)
Bilirubin, Direct: 0.2 mg/dL (ref 0.0–0.3)
Indirect Bilirubin: 0.4 mg/dL (ref 0.2–1.2)
Total Bilirubin: 0.6 mg/dL (ref 0.2–1.2)
Total Protein: 6.2 g/dL (ref 6.0–8.3)

## 2014-08-01 LAB — MAGNESIUM: Magnesium: 2 mg/dL (ref 1.5–2.5)

## 2014-08-01 LAB — TSH: TSH: 2.61 u[IU]/mL (ref 0.350–4.500)

## 2014-08-01 LAB — VITAMIN D 25 HYDROXY (VIT D DEFICIENCY, FRACTURES): Vit D, 25-Hydroxy: 74 ng/mL (ref 30–100)

## 2014-08-01 LAB — INSULIN, FASTING: Insulin fasting, serum: 8.3 u[IU]/mL (ref 2.0–19.6)

## 2014-08-02 LAB — URINE CULTURE
Colony Count: NO GROWTH
Organism ID, Bacteria: NO GROWTH

## 2014-08-12 ENCOUNTER — Other Ambulatory Visit: Payer: Self-pay | Admitting: Emergency Medicine

## 2014-08-12 DIAGNOSIS — Z1272 Encounter for screening for malignant neoplasm of vagina: Secondary | ICD-10-CM | POA: Diagnosis not present

## 2014-08-22 ENCOUNTER — Telehealth: Payer: Self-pay

## 2014-08-22 ENCOUNTER — Other Ambulatory Visit: Payer: Self-pay

## 2014-08-22 DIAGNOSIS — R5383 Other fatigue: Secondary | ICD-10-CM

## 2014-08-22 NOTE — Telephone Encounter (Signed)
Called patients home and spoke with husband [name], he states that patient is out of town but he would relay message that the [facility] results suggest a sleep study. Advised him that she would be contacted regarding a sleep study at facility.

## 2014-08-28 ENCOUNTER — Other Ambulatory Visit: Payer: Medicare HMO

## 2014-08-28 ENCOUNTER — Ambulatory Visit: Payer: Medicare HMO

## 2014-10-09 DIAGNOSIS — R0683 Snoring: Secondary | ICD-10-CM | POA: Diagnosis not present

## 2014-10-09 DIAGNOSIS — G4710 Hypersomnia, unspecified: Secondary | ICD-10-CM | POA: Diagnosis not present

## 2014-10-09 DIAGNOSIS — I341 Nonrheumatic mitral (valve) prolapse: Secondary | ICD-10-CM | POA: Diagnosis not present

## 2014-10-09 DIAGNOSIS — I2510 Atherosclerotic heart disease of native coronary artery without angina pectoris: Secondary | ICD-10-CM | POA: Diagnosis not present

## 2014-11-11 ENCOUNTER — Encounter: Payer: Self-pay | Admitting: Internal Medicine

## 2014-11-11 ENCOUNTER — Ambulatory Visit (INDEPENDENT_AMBULATORY_CARE_PROVIDER_SITE_OTHER): Payer: Medicare PPO | Admitting: Internal Medicine

## 2014-11-11 VITALS — BP 104/64 | HR 56 | Temp 97.5°F | Resp 18 | Ht 64.0 in | Wt 140.2 lb

## 2014-11-11 DIAGNOSIS — R7309 Other abnormal glucose: Secondary | ICD-10-CM

## 2014-11-11 DIAGNOSIS — E782 Mixed hyperlipidemia: Secondary | ICD-10-CM | POA: Diagnosis not present

## 2014-11-11 DIAGNOSIS — Z0000 Encounter for general adult medical examination without abnormal findings: Secondary | ICD-10-CM | POA: Insufficient documentation

## 2014-11-11 DIAGNOSIS — I10 Essential (primary) hypertension: Secondary | ICD-10-CM

## 2014-11-11 DIAGNOSIS — E559 Vitamin D deficiency, unspecified: Secondary | ICD-10-CM

## 2014-11-11 DIAGNOSIS — K589 Irritable bowel syndrome without diarrhea: Secondary | ICD-10-CM | POA: Insufficient documentation

## 2014-11-11 DIAGNOSIS — Z23 Encounter for immunization: Secondary | ICD-10-CM

## 2014-11-11 DIAGNOSIS — Z6824 Body mass index (BMI) 24.0-24.9, adult: Secondary | ICD-10-CM | POA: Diagnosis not present

## 2014-11-11 DIAGNOSIS — Z79899 Other long term (current) drug therapy: Secondary | ICD-10-CM | POA: Diagnosis not present

## 2014-11-11 DIAGNOSIS — R7303 Prediabetes: Secondary | ICD-10-CM

## 2014-11-11 LAB — CBC WITH DIFFERENTIAL/PLATELET
Basophils Absolute: 0 10*3/uL (ref 0.0–0.1)
Basophils Relative: 1 % (ref 0–1)
Eosinophils Absolute: 0.1 10*3/uL (ref 0.0–0.7)
Eosinophils Relative: 3 % (ref 0–5)
HCT: 37.6 % (ref 36.0–46.0)
Hemoglobin: 12.8 g/dL (ref 12.0–15.0)
Lymphocytes Relative: 19 % (ref 12–46)
Lymphs Abs: 0.9 10*3/uL (ref 0.7–4.0)
MCH: 30.5 pg (ref 26.0–34.0)
MCHC: 34 g/dL (ref 30.0–36.0)
MCV: 89.7 fL (ref 78.0–100.0)
MPV: 8.8 fL (ref 8.6–12.4)
Monocytes Absolute: 0.5 10*3/uL (ref 0.1–1.0)
Monocytes Relative: 10 % (ref 3–12)
Neutro Abs: 3.2 10*3/uL (ref 1.7–7.7)
Neutrophils Relative %: 67 % (ref 43–77)
Platelets: 196 10*3/uL (ref 150–400)
RBC: 4.19 MIL/uL (ref 3.87–5.11)
RDW: 13.3 % (ref 11.5–15.5)
WBC: 4.8 10*3/uL (ref 4.0–10.5)

## 2014-11-11 LAB — LIPID PANEL
Cholesterol: 145 mg/dL (ref 125–200)
HDL: 62 mg/dL (ref >=46)
LDL Cholesterol: 72 mg/dL (ref <130)
Total CHOL/HDL Ratio: 2.3 Ratio (ref <=5.0)
Triglycerides: 57 mg/dL (ref <150)
VLDL: 11 mg/dL (ref <30)

## 2014-11-11 LAB — HEPATIC FUNCTION PANEL
ALT: 26 U/L (ref 6–29)
AST: 24 U/L (ref 10–35)
Albumin: 4.2 g/dL (ref 3.6–5.1)
Alkaline Phosphatase: 57 U/L (ref 33–130)
Bilirubin, Direct: 0.2 mg/dL (ref <=0.2)
Indirect Bilirubin: 0.5 mg/dL (ref 0.2–1.2)
Total Bilirubin: 0.7 mg/dL (ref 0.2–1.2)
Total Protein: 6.3 g/dL (ref 6.1–8.1)

## 2014-11-11 LAB — HEMOGLOBIN A1C
Hgb A1c MFr Bld: 5.8 % — ABNORMAL HIGH (ref <5.7)
Mean Plasma Glucose: 120 mg/dL — ABNORMAL HIGH (ref <117)

## 2014-11-11 LAB — BASIC METABOLIC PANEL WITH GFR
BUN: 16 mg/dL (ref 7–25)
CO2: 27 mmol/L (ref 20–31)
Calcium: 9.2 mg/dL (ref 8.6–10.4)
Chloride: 104 mmol/L (ref 98–110)
Creat: 0.83 mg/dL (ref 0.60–0.93)
GFR, Est African American: 81 mL/min (ref >=60)
GFR, Est Non African American: 71 mL/min (ref >=60)
Glucose, Bld: 110 mg/dL — ABNORMAL HIGH (ref 65–99)
Potassium: 3.9 mmol/L (ref 3.5–5.3)
Sodium: 141 mmol/L (ref 135–146)

## 2014-11-11 LAB — MAGNESIUM: Magnesium: 2.1 mg/dL (ref 1.5–2.5)

## 2014-11-11 LAB — TSH: TSH: 3.029 u[IU]/mL (ref 0.350–4.500)

## 2014-11-11 NOTE — Patient Instructions (Signed)

## 2014-11-11 NOTE — Progress Notes (Signed)
Patient ID: Jasmine Reeves, female   DOB: 1941/02/24, 74 y.o.   MRN: [id number]   This very nice 74 y.o. MWF presents for 6 month follow up with Hypertension, ASHD/CABG,  Hyperlipidemia, Pre-Diabetes and Vitamin D Deficiency.    Patient is treated for HTN since 1998 & in & BP has been controlled at home. Today's BP: 104/64 mmHg. In Nov 2001 she presented w/ preinfarction angina and had PTCA/cutting angioplasty and then in Dec had PTCA and later in Jan 2002 she underwent CABG. She is followed closely by Dr Daphene Jaeger. Patient has had no complaints of any cardiac type chest pain, palpitations, dyspnea/orthopnea/PND, dizziness, claudication or dependent edema.   Hyperlipidemia is controlled with diet & meds. Patient denies myalgias or other med SE's. Last Lipids were at goal - Cholesterol 146; HDL 71; LDL 62; Triglycerides 63 on 07/31/2014.    Also, the patient has history of PreDiabetes with A1c 6.3% in 2012 and has had no symptoms of reactive hypoglycemia, diabetic polys, paresthesias or visual blurring.  Last A1c was 6.0% on 07/31/2014.   Further, the patient also has history of Vitamin D Deficiency and supplements vitamin D without any suspected side-effects. Last vitamin D was 74 on 07/31/2014.  Medication Sig  . aspirin 81 MG tablet Take 81 mg by mouth daily.  Marland Kitchen CALCIUM 1000 + D  Take 1 tablet by mouth daily.  Marland Kitchen VITAMIN D 2000 UNITS tablet Take 2,000 Units by mouth 2 (two) times daily.  Marland Kitchen DILT-XR 180 MG  cap TAKE ONE CAPSULE BY MOUTH DAILY  . fexofenadine 180 MG tablet Take 180 mg by mouth daily.  Madilyn Hook nasal spray Place 2 sprays into the nose daily as needed for rhinitis.  . hctz 25 MG tablet TAKE 1 TABLET BY MOUTH EVERY DAY FOR FLUID  . IRON PO Take 50 mg by mouth daily.  Marland Kitchen labetalol  100 MG tablet TAKE 1 AND 1/2 TABLETS BY MOUTH TWICE DAILY  . losartan 50 MG tablet TAKE 1/2 TO 1 TABLET BY MOUTH EVERY DAY FOR BLOOD PRESSURE  . Magnesium 400 MG CAPS Take by mouth daily.  . meloxicam  15 MG  tablet   . Multiple Vitamins-Minerals  Take 1 tablet by mouth daily.  Marland Kitchen NITROSTAT 0.4 MG SL tablet Place 1 tablet (0.4 mg total) under the tongue every 5 (five) minutes as needed for chest pain.  . pantoprazole  40 MG tablet TAKE 1 TABLET BY MOUTH TWICE DAILY 1/2 HOURS BEFORE BREAKFAST AND DINNER  . TURMERIC  Take 900 mg by mouth daily.   . vitamin C  500 MG tablet Take 500 mg by mouth daily.  Marland Kitchen VYTORIN 10-20 MG per tablet TAKE 1 TABLET BY MOUTH EVERY DAY   Allergies  Allergen Reactions  . Altace [Ramipril]     cough  . Azithromycin     palpitation  . Celebrex [Celecoxib]     Gi upset   . Hydrochlorothiazide     cramping  . 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 Prilosec [Omeprazole]     constipation    PMHx:   Past Medical History  Diagnosis Date  . GERD (gastroesophageal reflux disease)   . COPD (chronic obstructive pulmonary disease)   . Fibromyalgia   . Scoliosis   . IBS (irritable bowel syndrome)    Immunization History  Administered Date(s) Administered  . Influenza, High Dose Seasonal PF 11/11/2014  . Pneumococcal Conjugate-13 04/16/2014  . Pneumococcal-Unspecified 03/01/1998,  03/12/2010  . Td 03/27/2012  . Zoster 03/02/2007   Past Surgical History  Procedure Laterality Date  . Tonsillectomy    . Appendectomy    . Abdominal hysterectomy    . Tubal ligation    . Coronary artery bypass graft    . Coronary angioplasty  12/11  . Esophagogastroduodenoscopy  07/21/12    polyps, Hiatal hernia Dr. Matthias Hughs   FHx:    Reviewed / unchanged  SHx:    Reviewed / unchanged  Systems Review:  Constitutional: Denies fever, chills, wt changes, headaches, insomnia, fatigue, night sweats, change in appetite. Eyes: Denies redness, blurred vision, diplopia, discharge, itchy, watery eyes.  ENT: Denies discharge, congestion, post nasal drip, epistaxis, sore throat, earache, hearing loss, dental pain, tinnitus, vertigo, sinus pain, snoring.  CV:  Denies chest pain, palpitations, irregular heartbeat, syncope, dyspnea, diaphoresis, orthopnea, PND, claudication or edema. Respiratory: denies cough, dyspnea, DOE, pleurisy, hoarseness, laryngitis, wheezing.  Gastrointestinal: Denies dysphagia, odynophagia, heartburn, reflux, water brash, abdominal pain or cramps, nausea, vomiting, bloating, diarrhea, constipation, hematemesis, melena, hematochezia  or hemorrhoids. Genitourinary: Denies dysuria, frequency, urgency, nocturia, hesitancy, discharge, hematuria or flank pain. Musculoskeletal: Denies arthralgias, myalgias, stiffness, jt. swelling, pain, limping or strain/sprain.  Skin: Denies pruritus, rash, hives, warts, acne, eczema or change in skin lesion(s). Neuro: No weakness, tremor, incoordination, spasms, paresthesia or pain. Psychiatric: Denies confusion, memory loss or sensory loss. Endo: Denies change in weight, skin or hair change.  Heme/Lymph: No excessive bleeding, bruising or enlarged lymph nodes.  Physical Exam  BP 104/64 mmHg  Pulse 56  Temp(Src) 97.5 F (36.4 C)  Resp 18  Ht 5\' 4"  (1.626 m)  Wt 140 lb 3.2 oz (63.594 kg)  BMI 24.05 kg/m2  Appears well nourished and in no distress. Eyes: PERRLA, EOMs, conjunctiva no swelling or erythema. Sinuses: No frontal/maxillary tenderness ENT/Mouth: EAC's clear, TM's nl w/o erythema, bulging. Nares clear w/o erythema, swelling, exudates. Oropharynx clear without erythema or exudates. Oral hygiene is good. Tongue normal, non obstructing. Hearing intact.  Neck: Supple. Thyroid nl. Car 2+/2+ without bruits, nodes or JVD. Chest: Respirations nl with BS clear & equal w/o rales, rhonchi, wheezing or stridor.  Cor: Heart sounds normal w/ regular rate and rhythm without sig. murmurs, gallops, clicks, or rubs. Peripheral pulses normal and equal  without edema.  Abdomen: Soft & bowel sounds normal. Non-tender w/o guarding, rebound, hernias, masses, or organomegaly.  Lymphatics: Unremarkable.   Musculoskeletal: Full ROM all peripheral extremities, joint stability, 5/5 strength, and normal gait.  Skin: Warm, dry without exposed rashes, lesions or ecchymosis apparent.  Neuro: Cranial nerves intact, reflexes equal bilaterally. Sensory-motor testing grossly intact. Tendon reflexes grossly intact.  Pysch: Alert & oriented x 3.  Insight and judgement nl & appropriate. No ideations.  Assessment and Plan:  1. Essential hypertension   2. Mixed hyperlipidemia  - Lipid panel - TSH  3. Prediabetes  - Hemoglobin A1c - Insulin, random  4. Vitamin D deficiency  - Vit D  25 hydroxy   5. Need for prophylactic vaccination and inoculation against influenza  - Flu vaccine HIGH DOSE PF (Fluzone High dose)  6. Medication management  - BASIC METABOLIC PANEL WITH GFR - CBC with Differential/Platelet - Hepatic function panel - Magnesium  7. Body mass index (BMI) of 24.0-24.9 in adult   Recommended regular exercise, BP monitoring, weight control, and discussed med and SE's. Recommended labs to assess and monitor clinical status. Further disposition pending results of labs. Over 30 minutes of exam, counseling, chart review was  performed

## 2014-11-12 ENCOUNTER — Other Ambulatory Visit: Payer: Self-pay | Admitting: Physician Assistant

## 2014-11-12 ENCOUNTER — Other Ambulatory Visit: Payer: Self-pay | Admitting: Internal Medicine

## 2014-11-12 LAB — INSULIN, RANDOM: Insulin: 9.4 u[IU]/mL (ref 2.0–19.6)

## 2014-11-12 LAB — VITAMIN D 25 HYDROXY (VIT D DEFICIENCY, FRACTURES): Vit D, 25-Hydroxy: 78 ng/mL (ref 30–100)

## 2014-11-13 DIAGNOSIS — G4733 Obstructive sleep apnea (adult) (pediatric): Secondary | ICD-10-CM | POA: Diagnosis not present

## 2014-11-13 DIAGNOSIS — I341 Nonrheumatic mitral (valve) prolapse: Secondary | ICD-10-CM | POA: Diagnosis not present

## 2014-11-13 DIAGNOSIS — R0683 Snoring: Secondary | ICD-10-CM | POA: Diagnosis not present

## 2014-11-13 DIAGNOSIS — I2510 Atherosclerotic heart disease of native coronary artery without angina pectoris: Secondary | ICD-10-CM | POA: Diagnosis not present

## 2014-11-14 ENCOUNTER — Ambulatory Visit: Payer: Medicare PPO | Admitting: Cardiovascular Disease

## 2014-11-27 ENCOUNTER — Ambulatory Visit (INDEPENDENT_AMBULATORY_CARE_PROVIDER_SITE_OTHER): Payer: Medicare PPO | Admitting: Cardiovascular Disease

## 2014-11-27 ENCOUNTER — Encounter: Payer: Self-pay | Admitting: Cardiovascular Disease

## 2014-11-27 VITALS — BP 104/68 | HR 56 | Ht 65.0 in | Wt 142.3 lb

## 2014-11-27 DIAGNOSIS — G4733 Obstructive sleep apnea (adult) (pediatric): Secondary | ICD-10-CM

## 2014-11-27 DIAGNOSIS — E782 Mixed hyperlipidemia: Secondary | ICD-10-CM | POA: Diagnosis not present

## 2014-11-27 DIAGNOSIS — I10 Essential (primary) hypertension: Secondary | ICD-10-CM | POA: Diagnosis not present

## 2014-11-27 DIAGNOSIS — I2510 Atherosclerotic heart disease of native coronary artery without angina pectoris: Secondary | ICD-10-CM

## 2014-11-27 DIAGNOSIS — I341 Nonrheumatic mitral (valve) prolapse: Secondary | ICD-10-CM

## 2014-11-27 DIAGNOSIS — I2583 Coronary atherosclerosis due to lipid rich plaque: Principal | ICD-10-CM

## 2014-11-27 DIAGNOSIS — Z9989 Dependence on other enabling machines and devices: Secondary | ICD-10-CM

## 2014-11-27 NOTE — Patient Instructions (Signed)
No change with current medications   Your physician wants you to follow-up in 12 month with Dr Carmin Richmond will receive a reminder letter in the mail two months in advance. If you don't receive a letter, please call our office to schedule the follow-up appointment.

## 2014-11-27 NOTE — Progress Notes (Signed)
Patient ID: Jasmine Reeves, female   DOB: Jul 07, 1940, 74 y.o.   MRN: [id number]     HPI: Jasmine Reeves is a 74 y.o. female presents for 1 year cardiology evaluation.  Jasmine Reeves underwent CABG revascularization surgery x4 on 03/02/2000 by Dr. Roxy Manns with a LIMA placed her distal LAD, vein to the second diagonal, sequential vein to 2 marginal vessels arising from the circumflex coronary artery. Her RCA was free of disease and was not bypassed. Additional problems include mitral valve prolapse, Raynaud's disease, history of short bursts of nonsustained tachycardia on treadmill testing with reference to this on beta blocker therapy.,. She did develop edema remotely on amlodipine. She hasa history of documented APCs on Holter imaging which have improved with labetalol.  She underwent a nuclear perfusion study on 09/07/2013.  She was noted to have ST segment changes  on ECG but nuclear imaging showed apical thinning with breast attenuation, without ischemia.  She had normal wall motion.  She denies any awareness of palpitations.  She does have issues with some arthritic symptoms.  She takes diltiazem 180 mg in addition to losartan 25 mg, Normodyne 150 mg twice a day and HCTZ 25 mg.  She is on Vytorin 10/20 for hyperlipidemia.  She tells me she was referred for sleep study which was done in [place] after a home study was abnormal.  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  She is not yet followed up with reference to this. Epworth sleepiness scale score was increased at 16.  CPAP was recommended but this has not yet been initiated.  The patient was not aware that I also am involved in sleep medicine. I reviewed with her current Medicare guidelines.  She underwent blood work 2 weeks ago by Jasmine Reeves which showed a fasting glucose of 110.  She had normal renal function.  LFTs were normal.  Lipid  studies were excellent with a total cholesterol 145, triglycerides 57, HDL 62, and LDL 72.  Hemoglobin A1c was minimally increased at 5.8.  TSH was 3.029.  Past Medical History  Diagnosis Date  . GERD (gastroesophageal reflux disease)   . COPD (chronic obstructive pulmonary disease)   . Fibromyalgia   . Scoliosis   . IBS (irritable bowel syndrome)     Past Surgical History  Procedure Laterality Date  . Tonsillectomy    . Appendectomy    . Abdominal hysterectomy    . Tubal ligation    . Coronary artery bypass graft    . Coronary angioplasty  12/11  . Esophagogastroduodenoscopy  07/21/12    polyps, Hiatal hernia Jasmine Reeves    Allergies  Allergen Reactions  . Altace [Ramipril]     cough  . Azithromycin     palpitation  . Celebrex [Celecoxib]     Gi upset   . Hydrochlorothiazide     cramping  . 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 Prilosec [Omeprazole]     constipation    Current Outpatient Prescriptions  Medication Sig Dispense Refill  . aspirin 81 MG tablet Take 81 mg by mouth daily.    . Calcium Carb-Cholecalciferol (CALCIUM 1000 + D PO) Take 1 tablet by mouth daily.    . Cholecalciferol (VITAMIN D) 2000 UNITS tablet Take 2,000 Units by mouth 2 (two) times daily.    Marland Kitchen DILT-XR 180 MG 24 hr capsule TAKE 1 CAPSULE BY MOUTH DAILY  90 capsule 0  . fexofenadine (ALLEGRA) 180 MG tablet Take 180 mg by mouth daily.    . fluticasone (VERAMYST) 27.5 MCG/SPRAY nasal spray Place 2 sprays into the nose daily as needed for rhinitis. 10 g 8  . hydrochlorothiazide (HYDRODIURIL) 25 MG tablet Take 12.5 mg by mouth daily.    . IRON PO Take 50 mg by mouth daily.    Marland Kitchen labetalol (NORMODYNE) 100 MG tablet TAKE 1 AND 1/2 TABLETS BY MOUTH TWICE DAILY 270 tablet 1  . losartan (COZAAR) 50 MG tablet TAKE 1/2 TO 1 TABLET BY MOUTH EVERY DAY FOR BLOOD PRESSURE 90 tablet 1  . Magnesium 400 MG CAPS Take by mouth daily.    . Multiple Vitamins-Minerals (MULTIVITAMIN  WITH MINERALS) tablet Take 1 tablet by mouth daily.    . nitroGLYCERIN (NITROSTAT) 0.4 MG SL tablet Place 1 tablet (0.4 mg total) under the tongue every 5 (five) minutes as needed for chest pain. 25 tablet 12  . pantoprazole (PROTONIX) 40 MG tablet TAKE 1 TABLET BY MOUTH TWICE DAILY 0.5 HOUR BEFORE BREAKFAST AND DINNER 180 tablet 1  . vitamin C (ASCORBIC ACID) 500 MG tablet Take 500 mg by mouth daily.    Marland Kitchen VYTORIN 10-20 MG per tablet TAKE 1 TABLET BY MOUTH EVERY DAY 90 tablet 1   No current facility-administered medications for this visit.    Social History   Social History  . Marital Status: Married    Spouse Name: N/A  . Number of Children: N/A  . Years of Education: N/A   Occupational History  . Not on file.   Social History Main Topics  . Smoking status: Former Smoker    Start date: 02/01/1963  . Smokeless tobacco: Not on file  . Alcohol Use: 1.8 oz/week    3 Standard drinks or equivalent per week     Comment: rare  . Drug Use: No  . Sexual Activity: Not on file   Other Topics Concern  . Not on file   Social History Narrative   Socially she is married has 2 children 3 grandchildren. She does not routinely exercise. There is no tobacco use. She does drink occasional alcohol. She is retired Systems analyst.  Family History  Problem Relation Age of Onset  . Heart disease Brother   . Hyperlipidemia Brother    ROS General: Negative; No fevers, chills, or night sweats;  HEENT: Negative; No changes in vision or hearing, sinus congestion, difficulty swallowing Pulmonary: Negative; No cough, wheezing, shortness of breath, hemoptysis Cardiovascular: Negative; No chest pain, presyncope, syncope, palpatations GI: Negative; No nausea, vomiting, diarrhea, or abdominal pain GU: Negative; No dysuria, hematuria, or difficulty voiding Musculoskeletal: Positive for a Raynaud's; no myalgias, joint pain, or weakness Hematologic/Oncology: Negative; no easy bruising,  bleeding Endocrine: Negative; no heat/cold intolerance; no diabetes Neuro: Negative; no changes in balance, headaches Skin: Negative; No rashes or skin lesions Psychiatric: Negative; No behavioral problems, depression Sleep: Positive for recently diagnosed obstructive sleep apnea.  CPAP therapy not yet instituted.   Other comprehensive 14 point system review is negative.   PE BP 104/68 mmHg  Pulse 56  Ht 5' 5"  (1.651 m)  Wt 142 lb 4.8 oz (64.547 kg)  BMI 23.68 kg/m2  Wt Readings from Last 3 Encounters:  11/27/14 142 lb 4.8 oz (64.547 kg)  11/11/14 140 lb 3.2 oz (63.594 kg)  07/31/14 143 lb (64.864 kg)   General: Alert, oriented, no distress.  Skin: normal turgor, no rashes HEENT: Normocephalic, atraumatic. Pupils  round and reactive; sclera anicteric;no lid lag.  Nose without nasal septal hypertrophy Mouth/Parynx benign; Mallinpatti scale 2 Neck: No JVD, no carotid bruits with normal carotid upstroke Lungs: clear to ausculatation and percussion; no wheezing or rales Chest wall: Nontender to palpation Heart: RRR, s1 s2 normal a 1/6 systolic murmur, soft systolic click.  No S3 or S4 gallop.  No diastolic murmur, rubs, thrills or heaves Abdomen: soft, nontender; no hepatosplenomehaly, BS+; abdominal aorta nontender and not dilated by palpation. Back: No CVA tenderness Pulses 2+ Extremities: no clubbing cyanosis or edema, Homan's sign negative  Neurologic: grossly nonfocal Psychologic: normal affect and mood.  ECG (independently read by me): Sinus bradycardia with sinus arrhythmia.  No significant ST-T changes.   August 2015 ECG: Sinus rhythm at 51 beats per minute. Non-specific T changes.  LABS: BMP Latest Ref Rng 11/11/2014 07/31/2014 04/12/2014  Glucose 65 - 99 mg/dL 110(H) 91 108(H)  BUN 7 - 25 mg/dL 16 13 16   Creatinine 0.60 - 0.93 mg/dL 0.83 0.64 0.69  Sodium 135 - 146 mmol/L 141 139 140  Potassium 3.5 - 5.3 mmol/L 3.9 4.2 3.8  Chloride 98 - 110 mmol/L 104 104 102  CO2  20 - 31 mmol/L 27 27 27   Calcium 8.6 - 10.4 mg/dL 9.2 8.9 9.1   Hepatic Function Latest Ref Rng 11/11/2014 07/31/2014 04/12/2014  Total Protein 6.1 - 8.1 g/dL 6.3 6.2 6.3  Albumin 3.6 - 5.1 g/dL 4.2 4.4 4.2  AST 10 - 35 U/L 24 24 22   ALT 6 - 29 U/L 26 25 21   Alk Phosphatase 33 - 130 U/L 57 53 49  Total Bilirubin 0.2 - 1.2 mg/dL 0.7 0.6 0.5  Bilirubin, Direct <=0.2 mg/dL 0.2 0.2 0.1   CBC Latest Ref Rng 11/11/2014 07/31/2014 04/12/2014  WBC 4.0 - 10.5 K/uL 4.8 5.1 4.7  Hemoglobin 12.0 - 15.0 g/dL 12.8 13.0 12.9  Hematocrit 36.0 - 46.0 % 37.6 37.6 39.0  Platelets 150 - 400 K/uL 196 208 206   Lab Results  Component Value Date   MCV 89.7 11/11/2014   MCV 89.3 07/31/2014   MCV 91.1 04/12/2014   Lab Results  Component Value Date   TSH 3.029 11/11/2014   Lab Results  Component Value Date   HGBA1C 5.8* 11/11/2014   Lipid Panel     Component Value Date/Time   CHOL 145 11/11/2014 1155   TRIG 57 11/11/2014 1155   HDL 62 11/11/2014 1155   CHOLHDL 2.3 11/11/2014 1155   VLDL 11 11/11/2014 1155   LDLCALC 72 11/11/2014 1155    RADIOLOGY: No results found.    ASSESSMENT AND PLAN: Jasmine Reeves is a 74 years old Caucasian female who underwent cardiac catheterization on New Year's Eve 2001 showed severe two-vessel coronary artery disease and CABG revascularization surgery on 03/02/2000. Her last catheterization was in 2008 which showed patent grafts. She has normal LV function and mitral valve prolapse. She is not having ectopy on low-dose beta blocker therapy.  Her last  nuclear perfusion study done last year was unchanged and continued to show normal perfusion without scar or ischemia.  She did have exercise-induced ST changes.  I reviewed her laboratory in detail.  Her lipids are excellent.  Her blood pressure today is well controlled.  She's not having any significant range on symptoms.  She was recently diagnosed with mild sleep apnea with a significant positional component with supine  sleep.  She will be initiating CPAP therapy.  I discussed with her different mask options.  I assume that she will be following up with the physician who read her sleep study for assessment of medicare compliance following initiation of CPAP therapy.  I will see her in one year for cardiology evaluation but sooner if problems arise.   Troy Sine, MD, [facility]  11/27/2014 12:08 PM

## 2014-11-29 ENCOUNTER — Telehealth: Payer: Self-pay | Admitting: Cardiovascular Disease

## 2014-11-29 NOTE — Telephone Encounter (Signed)
Call goes to VM, unable to leave message in box.

## 2014-11-29 NOTE — Telephone Encounter (Signed)
Have we received sleep study

## 2014-11-29 NOTE — Telephone Encounter (Signed)
Pt calling to see if we received sleep study from her PCP.  Informed her Jasmine Reeves may have received, I am unable to locate this, will route to her - pt aware Jasmine Reeves is out of office today.  If not on-hand/not received from primary care, patient states she can bring by the copy she has.

## 2014-12-04 ENCOUNTER — Encounter: Payer: Self-pay | Admitting: Cardiovascular Disease

## 2014-12-04 DIAGNOSIS — I10 Essential (primary) hypertension: Secondary | ICD-10-CM | POA: Diagnosis not present

## 2014-12-04 DIAGNOSIS — G4733 Obstructive sleep apnea (adult) (pediatric): Secondary | ICD-10-CM | POA: Diagnosis not present

## 2014-12-05 NOTE — Telephone Encounter (Signed)
Returned a call to patient. Informed her that the sleep study has been received.

## 2015-01-03 DIAGNOSIS — K5730 Diverticulosis of large intestine without perforation or abscess without bleeding: Secondary | ICD-10-CM | POA: Diagnosis not present

## 2015-01-03 DIAGNOSIS — D126 Benign neoplasm of colon, unspecified: Secondary | ICD-10-CM | POA: Diagnosis not present

## 2015-01-03 DIAGNOSIS — D125 Benign neoplasm of sigmoid colon: Secondary | ICD-10-CM | POA: Diagnosis not present

## 2015-01-03 DIAGNOSIS — Z09 Encounter for follow-up examination after completed treatment for conditions other than malignant neoplasm: Secondary | ICD-10-CM | POA: Diagnosis not present

## 2015-01-03 DIAGNOSIS — Z86010 Personal history of colonic polyps: Secondary | ICD-10-CM | POA: Diagnosis not present

## 2015-01-04 DIAGNOSIS — I10 Essential (primary) hypertension: Secondary | ICD-10-CM | POA: Diagnosis not present

## 2015-01-04 DIAGNOSIS — G4733 Obstructive sleep apnea (adult) (pediatric): Secondary | ICD-10-CM | POA: Diagnosis not present

## 2015-01-06 DIAGNOSIS — I10 Essential (primary) hypertension: Secondary | ICD-10-CM | POA: Diagnosis not present

## 2015-01-06 DIAGNOSIS — G4733 Obstructive sleep apnea (adult) (pediatric): Secondary | ICD-10-CM | POA: Diagnosis not present

## 2015-02-03 DIAGNOSIS — G4733 Obstructive sleep apnea (adult) (pediatric): Secondary | ICD-10-CM | POA: Diagnosis not present

## 2015-02-03 DIAGNOSIS — I10 Essential (primary) hypertension: Secondary | ICD-10-CM | POA: Diagnosis not present

## 2015-02-19 ENCOUNTER — Encounter: Payer: Self-pay | Admitting: Physician Assistant

## 2015-02-19 ENCOUNTER — Ambulatory Visit (INDEPENDENT_AMBULATORY_CARE_PROVIDER_SITE_OTHER): Payer: Medicare PPO | Admitting: Physician Assistant

## 2015-02-19 VITALS — BP 108/60 | HR 54 | Temp 97.5°F | Resp 14 | Ht 65.0 in | Wt 140.0 lb

## 2015-02-19 DIAGNOSIS — I2583 Coronary atherosclerosis due to lipid rich plaque: Secondary | ICD-10-CM

## 2015-02-19 DIAGNOSIS — E559 Vitamin D deficiency, unspecified: Secondary | ICD-10-CM

## 2015-02-19 DIAGNOSIS — I2510 Atherosclerotic heart disease of native coronary artery without angina pectoris: Secondary | ICD-10-CM

## 2015-02-19 DIAGNOSIS — R350 Frequency of micturition: Secondary | ICD-10-CM

## 2015-02-19 DIAGNOSIS — I10 Essential (primary) hypertension: Secondary | ICD-10-CM | POA: Diagnosis not present

## 2015-02-19 DIAGNOSIS — Z79899 Other long term (current) drug therapy: Secondary | ICD-10-CM | POA: Diagnosis not present

## 2015-02-19 DIAGNOSIS — M791 Myalgia, unspecified site: Secondary | ICD-10-CM

## 2015-02-19 DIAGNOSIS — R7309 Other abnormal glucose: Secondary | ICD-10-CM | POA: Diagnosis not present

## 2015-02-19 DIAGNOSIS — E782 Mixed hyperlipidemia: Secondary | ICD-10-CM | POA: Diagnosis not present

## 2015-02-19 DIAGNOSIS — J449 Chronic obstructive pulmonary disease, unspecified: Secondary | ICD-10-CM | POA: Diagnosis not present

## 2015-02-19 LAB — CBC WITH DIFFERENTIAL/PLATELET
Basophils Absolute: 0.1 10*3/uL (ref 0.0–0.1)
Basophils Relative: 1 % (ref 0–1)
Eosinophils Absolute: 0.2 10*3/uL (ref 0.0–0.7)
Eosinophils Relative: 3 % (ref 0–5)
HCT: 38.5 % (ref 36.0–46.0)
Hemoglobin: 13 g/dL (ref 12.0–15.0)
Lymphocytes Relative: 20 % (ref 12–46)
Lymphs Abs: 1 10*3/uL (ref 0.7–4.0)
MCH: 30.7 pg (ref 26.0–34.0)
MCHC: 33.8 g/dL (ref 30.0–36.0)
MCV: 91 fL (ref 78.0–100.0)
MPV: 8.9 fL (ref 8.6–12.4)
Monocytes Absolute: 0.4 10*3/uL (ref 0.1–1.0)
Monocytes Relative: 8 % (ref 3–12)
Neutro Abs: 3.4 10*3/uL (ref 1.7–7.7)
Neutrophils Relative %: 68 % (ref 43–77)
Platelets: 206 10*3/uL (ref 150–400)
RBC: 4.23 MIL/uL (ref 3.87–5.11)
RDW: 12.8 % (ref 11.5–15.5)
WBC: 5 10*3/uL (ref 4.0–10.5)

## 2015-02-19 NOTE — Patient Instructions (Addendum)
Tumeric with black pepper extract is a great natural antiinflammatory that helps with arthritis and aches and pain. Can get from [employer] or any health food store. Need to take at least 800mg  twice a day with food.   Muscle aches  We will check your potassium, magnesium to see if these are low which can cause muscle aches.  Please make sure you are taking 64 oz of water a day as long as you do now have a heart condition.  -Please take Tylenol or Aleve for pain. -You can take tylenol (500mg ) or tylenol arthritis (650mg ). The max you can take of tylenol a day is 3000mg  daily, this is a max of 6 pills a day of the regular tyelnol (500mg ) or a max of 4 a day of the tylenol arthritis (650mg ) as long as no other medications you are taking contain tylenol.  -Aleve/Naproxen Sodium- can take 1-2 in the morning and 1 at night. Max 3 a day. Take with food to avoid ulcers. Does affect your kidney function so use sparingly.    Muscle Pain Muscle pain (myalgia) may be caused by many things, including:  Overuse or muscle strain, especially if you are not in shape. This is the most common cause of muscle pain.  Injury.  Bruises.  Viruses, such as the flu.  Infectious diseases.  Fibromyalgia, which is a chronic condition that causes muscle tenderness, fatigue, and headache.  Autoimmune diseases, including lupus.  Certain drugs, including ACE inhibitors and statins. Muscle pain may be mild or severe. In most cases, the pain lasts only a short time and goes away without treatment. To diagnose the cause of your muscle pain, your health care provider will take your medical history. This means he or she will ask you when your muscle pain began and what has been happening. If you have not had muscle pain for very long, your health care provider may want to wait before doing much testing. If your muscle pain has lasted a long time, your health care provider may want to run tests right away. If your health care  provider thinks your muscle pain may be caused by illness, you may need to have additional tests to rule out certain conditions.  Treatment for muscle pain depends on the cause. Home care is often enough to relieve muscle pain. Your health care provider may also prescribe anti-inflammatory medicine. HOME CARE INSTRUCTIONS Watch your condition for any changes. The following actions may help to lessen any discomfort you are feeling:  Only take over-the-counter or prescription medicines as directed by your health care provider.  Apply ice to the sore muscle:  Put ice in a plastic bag.  Place a towel between your skin and the bag.  Leave the ice on for 15-20 minutes, 3-4 times a day.  You may alternate applying hot and cold packs to the muscle as directed by your health care provider.  If overuse is causing your muscle pain, slow down your activities until the pain goes away.  Remember that it is normal to feel some muscle pain after starting a workout program. Muscles that have not been used often will be sore at first.  Do regular, gentle exercises if you are not usually active.  Warm up before exercising to lower your risk of muscle pain.  Do not continue working out if the pain is very bad. Bad pain could mean you have injured a muscle. SEEK MEDICAL CARE IF:  Your muscle pain gets worse, and medicines  do not help.  You have muscle pain that lasts longer than 3 days.  You have a rash or fever along with muscle pain.  You have muscle pain after a tick bite.  You have muscle pain while working out, even though you are in good physical condition.  You have redness, soreness, or swelling along with muscle pain.  You have muscle pain after starting a new medicine or changing the dose of a medicine. SEEK IMMEDIATE MEDICAL CARE IF:  You have trouble breathing.  You have trouble swallowing.  You have muscle pain along with a stiff neck, fever, and vomiting.  You have severe  muscle weakness or cannot move part of your body. MAKE SURE YOU:   Understand these instructions.  Will watch your condition.  Will get help right away if you are not doing well or get worse. Document Released: 01/07/2006 Document Revised: 02/20/2013 Document Reviewed: 12/12/2012 [facility] Patient Information 2015 [place], [place]. This information is not intended to replace advice given to you by your health care provider. Make sure you discuss any questions you have with your health care provider.

## 2015-02-19 NOTE — Progress Notes (Signed)
Assessment and Plan:  1. Hypertension -Continue medication, monitor blood pressure at home. Continue DASH diet.  Reminder to go to the ER if any CP, SOB, nausea, dizziness, severe HA, changes vision/speech, left arm numbness and tingling and jaw pain.  2. Cholesterol -Continue diet and exercise. Check cholesterol.   3. Prediabetes  -Continue diet and exercise. Check A1C  4. Vitamin D Def - check level and continue medications.   5. CAD Control blood pressure, cholesterol, glucose, increase exercise.  Continue cardio follow up  6. Urinary frequency Check urine  7. Joint pain Check electrolytes, add tumeric with food, stretches.  Check CPK  Continue diet and meds as discussed. Further disposition pending results of labs. Over 30 minutes of exam, counseling, chart review, and critical decision making was performed  HPI 74 y.o. female  presents for 3 month follow up on hypertension, cholesterol, prediabetes, and vitamin D deficiency.   Her blood pressure has been controlled at home, today their BP is BP: 108/60 mmHg  She does workout. She denies chest pain, shortness of breath, dizziness. She has a history of ASHD S/P CABG x 4 in 2002. Normal stress test 08/2013. She follows with Dr. Tresa EndoKelly. 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 She is on ASA. Denies chest pain, exertional chest pressure/discomfort and palpitations. She had + OSA and is CPAP, states she is sleeping better through the night and does not have the chest pressure/tightness in the morning.   She is on cholesterol medication and denies myalgias. Her cholesterol is at goal. The cholesterol last visit was:   Lab Results  Component Value Date   CHOL 145 11/11/2014   HDL 62 11/11/2014   LDLCALC 72 11/11/2014   TRIG 57 11/11/2014   CHOLHDL 2.3 11/11/2014    She has been working on diet and exercise for prediabetes, and denies paresthesia of the feet, polydipsia, polyuria and visual disturbances. Last A1C in the  office was:  Lab Results  Component Value Date   HGBA1C 5.8* 11/11/2014   Patient is on Vitamin D supplement.   Lab Results  Component Value Date   VD25OH 6478 11/11/2014     Husband diagnosed with parkinson's, son relapse of rectal cancer, had chemo recently and spent thanksgiving at the beach with him. Whole [place] going [place].  With the cold, has had some diffuse joint pain, shoulders, bilateral thumbs, hips, knees.   Current Medications:  Current Outpatient Prescriptions on File Prior to Visit  Medication Sig Dispense Refill  . aspirin 81 MG tablet Take 81 mg by mouth daily.    . Calcium Carb-Cholecalciferol (CALCIUM 1000 + D PO) Take 1 tablet by mouth daily.    . Cholecalciferol (VITAMIN D) 2000 UNITS tablet Take 2,000 Units by mouth 2 (two) times daily.    Marland Kitchen. DILT-XR 180 MG 24 hr capsule TAKE 1 CAPSULE BY MOUTH DAILY 90 capsule 0  . fexofenadine (ALLEGRA) 180 MG tablet Take 180 mg by mouth daily.    . fluticasone (VERAMYST) 27.5 MCG/SPRAY nasal spray Place 2 sprays into the nose daily as needed for rhinitis. 10 g 8  . hydrochlorothiazide (HYDRODIURIL) 25 MG tablet Take 12.5 mg by mouth daily.    . IRON PO Take 50 mg by mouth daily.    Marland Kitchen. labetalol (NORMODYNE) 100 MG tablet TAKE 1 AND 1/2 TABLETS BY MOUTH TWICE DAILY 270 tablet 1  . losartan (COZAAR) 50 MG tablet TAKE 1/2 TO 1 TABLET BY MOUTH EVERY DAY  FOR BLOOD PRESSURE 90 tablet 1  . Magnesium 400 MG CAPS Take by mouth daily.    . Multiple Vitamins-Minerals (MULTIVITAMIN WITH MINERALS) tablet Take 1 tablet by mouth daily.    . nitroGLYCERIN (NITROSTAT) 0.4 MG SL tablet Place 1 tablet (0.4 mg total) under the tongue every 5 (five) minutes as needed for chest pain. 25 tablet 12  . pantoprazole (PROTONIX) 40 MG tablet TAKE 1 TABLET BY MOUTH TWICE DAILY 0.5 HOUR BEFORE BREAKFAST AND DINNER 180 tablet 1  . vitamin C (ASCORBIC ACID) 500 MG tablet Take 500 mg by mouth daily.    Marland Kitchen VYTORIN 10-20 MG per tablet TAKE 1 TABLET  BY MOUTH EVERY DAY 90 tablet 1   No current facility-administered medications on file prior to visit.   Medical History:  Past Medical History  Diagnosis Date  . GERD (gastroesophageal reflux disease)   . COPD (chronic obstructive pulmonary disease) (HCC)   . Fibromyalgia   . Scoliosis   . IBS (irritable bowel syndrome)    Allergies:  Allergies  Allergen Reactions  . Altace [Ramipril]     cough  . Azithromycin     palpitation  . Celebrex [Celecoxib]     Gi upset   . Hydrochlorothiazide     cramping  . 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 Prilosec [Omeprazole]     constipation     Review of Systems:  Review of Systems  Constitutional: Negative.  Negative for fever, chills and malaise/fatigue.  HENT: Negative.   Respiratory: Positive for shortness of breath (unchanged). Negative for cough and wheezing.   Cardiovascular: Negative for chest pain, palpitations and leg swelling.  Gastrointestinal: Positive for heartburn. Negative for nausea, vomiting, abdominal pain, diarrhea, constipation and blood in stool.  Genitourinary: Positive for frequency. Negative for dysuria, urgency, hematuria and flank pain.  Musculoskeletal: Positive for myalgias and back pain (right hip/back). Negative for joint pain, falls and neck pain.  Skin: Negative.  Negative for rash.  Neurological: Negative for dizziness, tremors, seizures, weakness and headaches.  Psychiatric/Behavioral: Negative for suicidal ideas. The patient is nervous/anxious.     Family history- Review and unchanged Social history- Review and unchanged Physical Exam: BP 108/60 mmHg  Pulse 54  Temp(Src) 97.5 F (36.4 C) (Temporal)  Resp 14  Ht  (1.651 m)  Wt 140 lb (63.504 kg)  BMI 23.30 kg/m2  SpO2 98% Wt Readings from Last 3 Encounters:  02/19/15 140 lb (63.504 kg)  11/27/14 142 lb 4.8 oz (64.547 kg)  11/11/14 140 lb 3.2 oz (63.594 kg)   General Appearance: Well nourished, in no  apparent distress. Eyes: PERRLA, EOMs, conjunctiva no swelling or erythema Sinuses: No Frontal/maxillary tenderness ENT/Mouth: Ext aud canals clear, TMs without erythema, bulging. No erythema, swelling, or exudate on post pharynx.  Tonsils not swollen or erythematous. Hearing normal.  Neck: Supple, thyroid normal.  Respiratory: Respiratory effort normal, BS equal bilaterally without rales, rhonchi, wheezing or stridor.  Cardio: RRR with systolic murmur Brisk peripheral pulses without edema.  Abdomen: Soft, + BS,  Non tender, no guarding, rebound, hernias, masses. Lymphatics: Non tender without lymphadenopathy.  Musculoskeletal: Full ROM, 5/5 strength, Normal gait Skin: Warm, dry without rashes, lesions, ecchymosis.  Neuro: Cranial nerves intact. Normal muscle tone, no cerebellar symptoms. Psych: Awake and oriented X 3, normal affect, Insight and Judgment appropriate.    Quentin Mulling, PA-C 10:48 AM [facility]

## 2015-02-20 LAB — HEPATIC FUNCTION PANEL
ALT: 24 U/L (ref 6–29)
AST: 25 U/L (ref 10–35)
Albumin: 4.4 g/dL (ref 3.6–5.1)
Alkaline Phosphatase: 53 U/L (ref 33–130)
Bilirubin, Direct: 0.2 mg/dL (ref <=0.2)
Indirect Bilirubin: 0.5 mg/dL (ref 0.2–1.2)
Total Bilirubin: 0.7 mg/dL (ref 0.2–1.2)
Total Protein: 6.3 g/dL (ref 6.1–8.1)

## 2015-02-20 LAB — LIPID PANEL
Cholesterol: 135 mg/dL (ref 125–200)
HDL: 71 mg/dL (ref >=46)
LDL Cholesterol: 54 mg/dL (ref <130)
Total CHOL/HDL Ratio: 1.9 Ratio (ref <=5.0)
Triglycerides: 50 mg/dL (ref <150)
VLDL: 10 mg/dL (ref <30)

## 2015-02-20 LAB — HEMOGLOBIN A1C
Hgb A1c MFr Bld: 5.7 % — ABNORMAL HIGH (ref <5.7)
Mean Plasma Glucose: 117 mg/dL — ABNORMAL HIGH (ref <117)

## 2015-02-20 LAB — BASIC METABOLIC PANEL WITH GFR
BUN: 16 mg/dL (ref 7–25)
CO2: 24 mmol/L (ref 20–31)
Calcium: 9 mg/dL (ref 8.6–10.4)
Chloride: 103 mmol/L (ref 98–110)
Creat: 0.7 mg/dL (ref 0.60–0.93)
GFR, Est African American: >89 mL/min (ref >=60)
GFR, Est Non African American: 87 mL/min (ref >=60)
Glucose, Bld: 99 mg/dL (ref 65–99)
Potassium: 3.8 mmol/L (ref 3.5–5.3)
Sodium: 142 mmol/L (ref 135–146)

## 2015-02-20 LAB — TSH: TSH: 2.708 u[IU]/mL (ref 0.350–4.500)

## 2015-02-20 LAB — URINALYSIS, ROUTINE W REFLEX MICROSCOPIC
Bilirubin Urine: NEGATIVE
Glucose, UA: NEGATIVE
Hgb urine dipstick: NEGATIVE
Leukocytes, UA: NEGATIVE
Nitrite: NEGATIVE
Protein, ur: NEGATIVE
Specific Gravity, Urine: 1.021 (ref 1.001–1.035)
pH: 7.5 (ref 5.0–8.0)

## 2015-02-20 LAB — MAGNESIUM: Magnesium: 2.1 mg/dL (ref 1.5–2.5)

## 2015-02-20 LAB — URINE CULTURE
Colony Count: NO GROWTH
Organism ID, Bacteria: NO GROWTH

## 2015-02-20 LAB — VITAMIN D 25 HYDROXY (VIT D DEFICIENCY, FRACTURES): Vit D, 25-Hydroxy: 70 ng/mL (ref 30–100)

## 2015-02-20 LAB — CK: Total CK: 87 U/L (ref 7–177)

## 2015-02-20 LAB — SEDIMENTATION RATE: Sed Rate: 3 mm/hr (ref 0–30)

## 2015-02-24 ENCOUNTER — Encounter: Payer: Self-pay | Admitting: *Deleted

## 2015-04-19 ENCOUNTER — Encounter: Payer: Self-pay | Admitting: *Deleted

## 2015-04-30 ENCOUNTER — Encounter: Payer: Self-pay | Admitting: Internal Medicine

## 2015-05-15 ENCOUNTER — Other Ambulatory Visit: Payer: Self-pay | Admitting: Internal Medicine

## 2015-05-22 ENCOUNTER — Encounter: Payer: Self-pay | Admitting: Internal Medicine

## 2015-05-22 ENCOUNTER — Ambulatory Visit (INDEPENDENT_AMBULATORY_CARE_PROVIDER_SITE_OTHER): Payer: Medicare Other | Admitting: Internal Medicine

## 2015-05-22 VITALS — BP 116/70 | HR 60 | Temp 97.3°F | Resp 16 | Ht 64.5 in | Wt 141.6 lb

## 2015-05-22 DIAGNOSIS — E559 Vitamin D deficiency, unspecified: Secondary | ICD-10-CM | POA: Diagnosis not present

## 2015-05-22 DIAGNOSIS — G4733 Obstructive sleep apnea (adult) (pediatric): Secondary | ICD-10-CM

## 2015-05-22 DIAGNOSIS — R6889 Other general symptoms and signs: Secondary | ICD-10-CM | POA: Diagnosis not present

## 2015-05-22 DIAGNOSIS — K219 Gastro-esophageal reflux disease without esophagitis: Secondary | ICD-10-CM

## 2015-05-22 DIAGNOSIS — R7309 Other abnormal glucose: Secondary | ICD-10-CM | POA: Diagnosis not present

## 2015-05-22 DIAGNOSIS — Z0001 Encounter for general adult medical examination with abnormal findings: Secondary | ICD-10-CM

## 2015-05-22 DIAGNOSIS — Z79899 Other long term (current) drug therapy: Secondary | ICD-10-CM | POA: Diagnosis not present

## 2015-05-22 DIAGNOSIS — I25118 Atherosclerotic heart disease of native coronary artery with other forms of angina pectoris: Secondary | ICD-10-CM | POA: Diagnosis not present

## 2015-05-22 DIAGNOSIS — D519 Vitamin B12 deficiency anemia, unspecified: Secondary | ICD-10-CM | POA: Diagnosis not present

## 2015-05-22 DIAGNOSIS — I10 Essential (primary) hypertension: Secondary | ICD-10-CM | POA: Diagnosis not present

## 2015-05-22 DIAGNOSIS — Z1212 Encounter for screening for malignant neoplasm of rectum: Secondary | ICD-10-CM

## 2015-05-22 DIAGNOSIS — E782 Mixed hyperlipidemia: Secondary | ICD-10-CM | POA: Diagnosis not present

## 2015-05-22 DIAGNOSIS — D509 Iron deficiency anemia, unspecified: Secondary | ICD-10-CM

## 2015-05-22 LAB — CBC WITH DIFFERENTIAL/PLATELET
Basophils Absolute: 0 10*3/uL (ref 0.0–0.1)
Basophils Relative: 1 % (ref 0–1)
Eosinophils Absolute: 0.1 10*3/uL (ref 0.0–0.7)
Eosinophils Relative: 3 % (ref 0–5)
HCT: 38.5 % (ref 36.0–46.0)
Hemoglobin: 13 g/dL (ref 12.0–15.0)
Lymphocytes Relative: 23 % (ref 12–46)
Lymphs Abs: 1.1 10*3/uL (ref 0.7–4.0)
MCH: 30.4 pg (ref 26.0–34.0)
MCHC: 33.8 g/dL (ref 30.0–36.0)
MCV: 90.2 fL (ref 78.0–100.0)
MPV: 9.4 fL (ref 8.6–12.4)
Monocytes Absolute: 0.4 10*3/uL (ref 0.1–1.0)
Monocytes Relative: 9 % (ref 3–12)
Neutro Abs: 3 10*3/uL (ref 1.7–7.7)
Neutrophils Relative %: 64 % (ref 43–77)
Platelets: 208 10*3/uL (ref 150–400)
RBC: 4.27 MIL/uL (ref 3.87–5.11)
RDW: 13.1 % (ref 11.5–15.5)
WBC: 4.7 10*3/uL (ref 4.0–10.5)

## 2015-05-22 LAB — BASIC METABOLIC PANEL WITH GFR
BUN: 16 mg/dL (ref 7–25)
CO2: 25 mmol/L (ref 20–31)
Calcium: 9.2 mg/dL (ref 8.6–10.4)
Chloride: 102 mmol/L (ref 98–110)
Creat: 0.75 mg/dL (ref 0.60–0.93)
GFR, Est African American: >89 mL/min (ref >=60)
GFR, Est Non African American: 80 mL/min (ref >=60)
Glucose, Bld: 91 mg/dL (ref 65–99)
Potassium: 4 mmol/L (ref 3.5–5.3)
Sodium: 138 mmol/L (ref 135–146)

## 2015-05-22 LAB — LIPID PANEL
Cholesterol: 142 mg/dL (ref 125–200)
HDL: 70 mg/dL (ref >=46)
LDL Cholesterol: 60 mg/dL (ref <130)
Total CHOL/HDL Ratio: 2 Ratio (ref <=5.0)
Triglycerides: 60 mg/dL (ref <150)
VLDL: 12 mg/dL (ref <30)

## 2015-05-22 LAB — HEPATIC FUNCTION PANEL
ALT: 31 U/L — ABNORMAL HIGH (ref 6–29)
AST: 31 U/L (ref 10–35)
Albumin: 4.3 g/dL (ref 3.6–5.1)
Alkaline Phosphatase: 57 U/L (ref 33–130)
Bilirubin, Direct: 0.2 mg/dL (ref <=0.2)
Indirect Bilirubin: 0.6 mg/dL (ref 0.2–1.2)
Total Bilirubin: 0.8 mg/dL (ref 0.2–1.2)
Total Protein: 6.6 g/dL (ref 6.1–8.1)

## 2015-05-22 LAB — IRON AND TIBC
%SAT: 57 % — ABNORMAL HIGH (ref 11–50)
Iron: 136 ug/dL (ref 45–160)
TIBC: 239 ug/dL — ABNORMAL LOW (ref 250–450)
UIBC: 103 ug/dL — ABNORMAL LOW (ref 125–400)

## 2015-05-22 LAB — MAGNESIUM: Magnesium: 2 mg/dL (ref 1.5–2.5)

## 2015-05-22 LAB — VITAMIN B12: Vitamin B-12: 460 pg/mL (ref 200–1100)

## 2015-05-22 LAB — TSH: TSH: 2.69 mIU/L

## 2015-05-22 NOTE — Patient Instructions (Signed)

## 2015-05-22 NOTE — Progress Notes (Signed)
Patient ID: Jasmine Reeves, female   DOB: [birth year], 75 y.o.   MRN: [id number]  Annual Screening/Preventative Visit And Comprehensive Evaluation &  Examination  This very nice 75 y.o. MWF presents for a Wellness/Preventative Visit & comprehensive evaluation and management of multiple medical co-morbidities.  Patient has been followed for HTN, Prediabetes, Hyperlipidemia and Vitamin D Deficiency.    HTN predates since 2001 when she also underwent CABG. Last Heart Cath in 2011 was negative. Patient's BP has been controlled at home and patient denies any cardiac symptoms as chest pain, palpitations, shortness of breath, dizziness or ankle swelling. Today's BP: 116/70 mmHg    Patient's hyperlipidemia is controlled with diet and medications. Patient denies myalgias or other medication SE's. Last lipids were at goal with Cholesterol 135; HDL 71; LDL 54; Triglycerides 50 on 02/19/2015.   Patient has prediabetes predating since  2012 with A1c 6.3% and patient denies reactive hypoglycemic symptoms, visual blurring, diabetic polys, or paresthesias. Last A1c was improved at 5.7% on 02/19/2015.   Finally, patient has history of Vitamin D Deficiency and last Vitamin D was 70 on 02/19/2015.   Medication Sig  . aspirin 81 MG tablet Take 81 mg by mouth daily.  Marland Kitchen CALCIUM 1000 + D Take 1 tablet by mouth daily.  Marland Kitchen VITAMIN D 2000 UNITS tablet Take 2,000 Units by mouth 2 (two) times daily.  Marland Kitchen DILT-XR 180 MG TAKE 1 CAPSULE BY MOUTH DAILY  . ALLEGRA 180 MG Take 180 mg by mouth daily.  Madilyn Hook  nasal spray Place 2 sprays into the nose daily as needed for rhinitis.  . hctz 25 MG tablet Take 12.5 mg by mouth daily.  . IRON PO Take 50 mg by mouth daily.  Marland Kitchen labetalol  100 MG tablet TAKE 1 AND 1/2 TABLETS BY MOUTH TWICE DAILY  . Losartan 50 MG tablet TAKE 1/2 TO 1 TABLET BY MOUTH EVERY DAY FOR BLOOD PRESSURE  . Magnesium 400 MG CAPS Take by mouth daily.  . Multiple Vitamins-Minerals  Take 1 tablet by mouth daily.  Marland Kitchen  NITROSTAT 0.4 MG Place 1 tablet (0.4 mg total) under the tongue every 5 (five) minutes as needed for chest pain.  . pantoprazole 40 MG tablet TAKE 1 TABLET BY MOUTH TWICE DAILY 0.5 HOUR BEFORE BREAKFAST AND DINNER  . vitamin C  500 MG tablet Take 500 mg by mouth daily.  Marland Kitchen VYTORIN 10-20 MG tablet TAKE 1 TABLET BY MOUTH EVERY DAY   Allergies  Allergen Reactions  . Altace [Ramipril]     cough  . Azithromycin     palpitation  . Celebrex [Celecoxib]     Gi upset   . Hydrochlorothiazide     cramping  . 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 Prilosec [Omeprazole]     constipation   Past Medical History  Diagnosis Date  . GERD (gastroesophageal reflux disease)   . COPD (chronic obstructive pulmonary disease) (HCC)   . Fibromyalgia   . Scoliosis   . IBS (irritable bowel syndrome)    Health Maintenance  Topic Date Due  . MAMMOGRAM  06/22/2015  . INFLUENZA VACCINE  09/30/2015  . TETANUS/TDAP  03/27/2022  . COLONOSCOPY  01/02/2025  . DEXA SCAN  Completed  . ZOSTAVAX  Completed  . PNA vac Low Risk Adult  Completed   Immunization History  Administered Date(s) Administered  . Influenza, High Dose Seasonal PF 11/11/2014  . Pneumococcal Conjugate-13 04/16/2014  . Pneumococcal-Unspecified 03/01/1998, 03/12/2010  .  Td 03/27/2012  . Zoster 03/02/2007   Past Surgical History  Procedure Laterality Date  . Tonsillectomy    . Appendectomy    . Abdominal hysterectomy    . Tubal ligation    . Coronary artery bypass graft    . Coronary angioplasty  12/11  . Esophagogastroduodenoscopy  07/21/12    polyps, Hiatal hernia Dr. Matthias HughsBuccini   Family History  Problem Relation Age of Onset  . Heart disease Brother   . Hyperlipidemia Brother    Social History  Substance Use Topics  . Smoking status: Former Smoker    Start date: 02/01/1963  . Smokeless tobacco: None  . Alcohol Use: 1.8 oz/week    3 Standard drinks or equivalent per week     Comment: rare     ROS Constitutional: Denies fever, chills, weight loss/gain, headaches, insomnia,  night sweats, and change in appetite. Does c/o fatigue. Eyes: Denies redness, blurred vision, diplopia, discharge, itchy, watery eyes.  ENT: Denies discharge, congestion, post nasal drip, epistaxis, sore throat, earache, hearing loss, dental pain, Tinnitus, Vertigo, Sinus pain, snoring.  Cardio: Denies chest pain, palpitations, irregular heartbeat, syncope, dyspnea, diaphoresis, orthopnea, PND, claudication, edema Respiratory: denies cough, dyspnea, DOE, pleurisy, hoarseness, laryngitis, wheezing.  Gastrointestinal: Denies dysphagia, heartburn, reflux, water brash, pain, cramps, nausea, vomiting, bloating, diarrhea, constipation, hematemesis, melena, hematochezia, jaundice, hemorrhoids Genitourinary: Denies dysuria, frequency, urgency, nocturia, hesitancy, discharge, hematuria, flank pain Breast: Breast lumps, nipple discharge, bleeding.  Musculoskeletal: Denies arthralgia, myalgia, stiffness, Jt. Swelling,limp, and strain/sprain. Denies falls. Has chronic LBP. Skin: Denies puritis, rash, hives, warts, acne, eczema, changing in skin lesion Neuro: No weakness, tremor, incoordination, spasms, paresthesia, pain Psychiatric: Denies confusion, memory loss, sensory loss. Denies Depression. Endocrine: Denies change in weight, skin, hair change, nocturia, and paresthesia, diabetic polys, visual blurring, hyper / hypo glycemic episodes.  Heme/Lymph: No excessive bleeding, bruising, enlarged lymph nodes.  Physical Exam  BP 116/70 mmHg  Pulse 60  Temp(Src) 97.3 F (36.3 C)  Resp 16  Ht 5' 4.5" (1.638 m)  Wt 141 lb 9.6 oz (64.229 kg)  BMI 23.94 kg/m2  General Appearance: Well nourished and in no apparent distress. Eyes: PERRLA, EOMs, conjunctiva no swelling or erythema, normal fundi and vessels. Sinuses: No frontal/maxillary tenderness ENT/Mouth: EACs patent / TMs  nl. Nares clear without erythema, swelling, mucoid  exudates. Oral hygiene is good. No erythema, swelling, or exudate. Tongue normal, non-obstructing. Tonsils not swollen or erythematous. Hearing normal.  Neck: Supple, thyroid normal. No bruits, nodes or JVD. Respiratory: Respiratory effort normal.  BS equal and clear bilateral without rales, rhonci, wheezing or stridor. Cardio: Heart sounds are normal with regular rate and rhythm and no murmurs, rubs or gallops. Peripheral pulses are normal and equal bilaterally without edema. No aortic or femoral bruits. Chest: symmetric with normal excursions and percussion. Breasts: Symmetric, without lumps, nipple discharge, retractions, or fibrocystic changes.  Abdomen: Flat, soft, with bowel sounds. Nontender, no guarding, rebound, hernias, masses, or organomegaly.  Lymphatics: Non tender without lymphadenopathy.  Musculoskeletal: Mild compensatory s-shaped thoracoLumbar scoliosis. Full ROM all peripheral extremities, joint stability, 5/5 strength, and normal gait. Skin: Warm and dry without rashes, lesions, cyanosis, clubbing or  ecchymosis.  Neuro: Cranial nerves intact, reflexes equal bilaterally. Normal muscle tone, no cerebellar symptoms. Sensation intact.  Pysch: Alert and oriented X 3, normal affect, Insight and Judgment appropriate.   Assessment and Plan  1. Annual Preventative Screening Examination   2. Essential hypertension  - Microalbumin / creatinine urine ratio - EKG 12-Lead - US, RETROPERITNL  ABD,  LTD - TSH  3. Mixed hyperlipidemia  - Lipid panel - TSH  4. Abnormal blood sugar  - Hemoglobin A1c - Insulin, random  5. Vitamin D deficiency  - VITAMIN D 25 Hydroxy   6. Gastroesophageal reflux disease   7. 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 OSA (obstructive sleep apnea)   9. Screening for rectal cancer  - POC Hemoccult Bld/Stl   10. Anemia, iron deficiency  - Iron and TIBC  11. Vitamin B12  deficiency anemia  - Vitamin B12  12. Medication management  - Urinalysis, Routine w reflex microscopic  - CBC with Differential/Platelet - BASIC METABOLIC PANEL WITH GFR - Hepatic function panel - Magnesium   Continue prudent diet as discussed, weight control, BP monitoring, regular exercise, and medications. Discussed med's effects and SE's. Screening labs and tests as requested with regular follow-up as recommended. Over 40 minutes of exam, counseling, chart review and high complex critical decision making was performed.

## 2015-05-23 LAB — MICROALBUMIN / CREATININE URINE RATIO
Creatinine, Urine: 65 mg/dL (ref 20–320)
Microalb Creat Ratio: 5 mcg/mg creat (ref <30)
Microalb, Ur: 0.3 mg/dL

## 2015-05-23 LAB — HEMOGLOBIN A1C
Hgb A1c MFr Bld: 6.1 % — ABNORMAL HIGH (ref <5.7)
Mean Plasma Glucose: 128 mg/dL — ABNORMAL HIGH (ref <117)

## 2015-05-23 LAB — URINALYSIS, ROUTINE W REFLEX MICROSCOPIC
Bilirubin Urine: NEGATIVE
Glucose, UA: NEGATIVE
Hgb urine dipstick: NEGATIVE
Ketones, ur: NEGATIVE
Leukocytes, UA: NEGATIVE
Nitrite: NEGATIVE
Protein, ur: NEGATIVE
Specific Gravity, Urine: 1.014 (ref 1.001–1.035)
pH: 7 (ref 5.0–8.0)

## 2015-05-23 LAB — VITAMIN D 25 HYDROXY (VIT D DEFICIENCY, FRACTURES): Vit D, 25-Hydroxy: 70 ng/mL (ref 30–100)

## 2015-05-23 LAB — INSULIN, RANDOM: Insulin: 8 u[IU]/mL (ref 2.0–19.6)

## 2015-05-24 ENCOUNTER — Encounter: Payer: Self-pay | Admitting: Internal Medicine

## 2015-06-18 ENCOUNTER — Other Ambulatory Visit: Payer: Self-pay | Admitting: Internal Medicine

## 2015-06-21 ENCOUNTER — Encounter: Payer: Self-pay | Admitting: *Deleted

## 2015-08-15 ENCOUNTER — Other Ambulatory Visit: Payer: Self-pay | Admitting: Internal Medicine

## 2015-09-01 ENCOUNTER — Ambulatory Visit: Payer: Self-pay | Admitting: Internal Medicine

## 2015-09-04 ENCOUNTER — Encounter: Payer: Self-pay | Admitting: Internal Medicine

## 2015-09-04 ENCOUNTER — Ambulatory Visit (INDEPENDENT_AMBULATORY_CARE_PROVIDER_SITE_OTHER): Payer: Medicare Other | Admitting: Internal Medicine

## 2015-09-04 VITALS — BP 134/80 | HR 56 | Temp 98.0°F | Resp 16 | Ht 65.0 in | Wt 142.0 lb

## 2015-09-04 DIAGNOSIS — E782 Mixed hyperlipidemia: Secondary | ICD-10-CM | POA: Diagnosis not present

## 2015-09-04 DIAGNOSIS — I10 Essential (primary) hypertension: Secondary | ICD-10-CM | POA: Diagnosis not present

## 2015-09-04 DIAGNOSIS — J309 Allergic rhinitis, unspecified: Secondary | ICD-10-CM | POA: Diagnosis not present

## 2015-09-04 DIAGNOSIS — Z79899 Other long term (current) drug therapy: Secondary | ICD-10-CM

## 2015-09-04 DIAGNOSIS — R079 Chest pain, unspecified: Secondary | ICD-10-CM | POA: Diagnosis not present

## 2015-09-04 DIAGNOSIS — R7309 Other abnormal glucose: Secondary | ICD-10-CM | POA: Diagnosis not present

## 2015-09-04 DIAGNOSIS — E559 Vitamin D deficiency, unspecified: Secondary | ICD-10-CM | POA: Diagnosis not present

## 2015-09-04 LAB — CBC WITH DIFFERENTIAL/PLATELET
Basophils Absolute: 55 cells/uL (ref 0–200)
Basophils Relative: 1 %
Eosinophils Absolute: 165 cells/uL (ref 15–500)
Eosinophils Relative: 3 %
HCT: 37.3 % (ref 35.0–45.0)
Hemoglobin: 12.6 g/dL (ref 11.7–15.5)
Lymphocytes Relative: 23 %
Lymphs Abs: 1265 cells/uL (ref 850–3900)
MCH: 30.7 pg (ref 27.0–33.0)
MCHC: 33.8 g/dL (ref 32.0–36.0)
MCV: 91 fL (ref 80.0–100.0)
MPV: 9 fL (ref 7.5–12.5)
Monocytes Absolute: 440 cells/uL (ref 200–950)
Monocytes Relative: 8 %
Neutro Abs: 3575 cells/uL (ref 1500–7800)
Neutrophils Relative %: 65 %
Platelets: 190 10*3/uL (ref 140–400)
RBC: 4.1 MIL/uL (ref 3.80–5.10)
RDW: 12.9 % (ref 11.0–15.0)
WBC: 5.5 10*3/uL (ref 3.8–10.8)

## 2015-09-04 MED ORDER — NITROGLYCERIN 0.4 MG SL SUBL: 0.4000 mg | SUBLINGUAL_TABLET | SUBLINGUAL | Status: DC | PRN

## 2015-09-04 MED ORDER — FLUTICASONE FUROATE 27.5 MCG/SPRAY NA SUSP: 2.0000 | Freq: Every day | NASAL | Status: DC | PRN

## 2015-09-04 NOTE — Progress Notes (Signed)
Assessment and Plan:  Hypertension:  -Continue medication,  -monitor blood pressure at home.  -Continue DASH diet.   -Reminder to go to the ER if any CP, SOB, nausea, dizziness, severe HA, changes vision/speech, left arm numbness and tingling, and jaw pain.  Cholesterol: -Continue diet and exercise.  -Check cholesterol.   Pre-diabetes: -Continue diet and exercise.  -Check A1C  Vitamin D Def: -continue medications.   CAD -asymptomatic -LDL is at goal -NTG refilled -cont exercise as tolerated  Shoulder and back pain -followed by ortho  Continue diet and meds as discussed. Further disposition pending results of labs.  HPI 75 y.o. female  presents for 3 month follow up with hypertension, hyperlipidemia, prediabetes and vitamin D.   Her blood pressure has been controlled at home, today their BP is BP: 134/80 mmHg.   She does not workout. She denies chest pain, shortness of breath, dizziness.   She is on cholesterol medication and denies myalgias. Her cholesterol is at goal. The cholesterol last visit was:   Lab Results  Component Value Date   CHOL 142 05/22/2015   HDL 70 05/22/2015   LDLCALC 60 05/22/2015   TRIG 60 05/22/2015   CHOLHDL 2.0 05/22/2015    She has been working on diet and exercise for prediabetes, and denies foot ulcerations, hyperglycemia, hypoglycemia , increased appetite, nausea, paresthesia of the feet, polydipsia, polyuria, visual disturbances, vomiting and weight loss. Last A1C in the office was:  Lab Results  Component Value Date   HGBA1C 6.1* 05/22/2015    Patient is on Vitamin D supplement.  Lab Results  Component Value Date   VD25OH 3170 05/22/2015      She reports that she is trying to work on walking.  She reports that she feels like she is avoiding exercise because of her back pain and bilateral shoulder pain.  She reports that she is going with her grandson soon on a mission trip.    She hs had no issues with COPD recently.    She does  note that she needs a refill on her NTG.  She never uses it but it has expired.  No recent CP, shortness of breath, wheezing.   Current Medications:  Current Outpatient Prescriptions on File Prior to Visit  Medication Sig Dispense Refill  . aspirin 81 MG tablet Take 81 mg by mouth daily.    . Calcium Carb-Cholecalciferol (CALCIUM 1000 + D PO) Take 1 tablet by mouth daily.    . Cholecalciferol (VITAMIN D) 2000 UNITS tablet Take 2,000 Units by mouth 2 (two) times daily.    Marland Kitchen. DILT-XR 180 MG 24 hr capsule TAKE 1 CAPSULE BY MOUTH DAILY 90 capsule 0  . ezetimibe-simvastatin (VYTORIN) 10-20 MG tablet TAKE 1 TABLET BY MOUTH EVERY DAY 90 tablet 0  . fexofenadine (ALLEGRA) 180 MG tablet Take 180 mg by mouth daily.    . fluticasone (VERAMYST) 27.5 MCG/SPRAY nasal spray Place 2 sprays into the nose daily as needed for rhinitis. 10 g 8  . hydrochlorothiazide (HYDRODIURIL) 25 MG tablet Take 12.5 mg by mouth daily.    . IRON PO Take 50 mg by mouth daily.    Marland Kitchen. labetalol (NORMODYNE) 100 MG tablet TAKE 1 AND 1/2 TABLETS BY MOUTH TWICE DAILY 270 tablet 0  . losartan (COZAAR) 50 MG tablet TAKE 1/2-1 TABLET BY MOUTH EVERY DAY FOR BLOOD PRESSURE 90 tablet 0  . Magnesium 400 MG CAPS Take by mouth daily.    . Multiple Vitamins-Minerals (MULTIVITAMIN WITH MINERALS) tablet Take  1 tablet by mouth daily.    . nitroGLYCERIN (NITROSTAT) 0.4 MG SL tablet Place 1 tablet (0.4 mg total) under the tongue every 5 (five) minutes as needed for chest pain. 25 tablet 12  . pantoprazole (PROTONIX) 40 MG tablet TAKE 1 TABLET BY MOUTH TWICE DAILY 0.5 HOUR BEFORE BREAKFAST AND DINNER 180 tablet 1  . vitamin C (ASCORBIC ACID) 500 MG tablet Take 500 mg by mouth daily.     No current facility-administered medications on file prior to visit.    Medical History:  Past Medical History  Diagnosis Date  . GERD (gastroesophageal reflux disease)   . COPD (chronic obstructive pulmonary disease) (HCC)   . Fibromyalgia   . Scoliosis   .  IBS (irritable bowel syndrome)     Allergies:  Allergies  Allergen Reactions  . Altace [Ramipril]     cough  . Azithromycin     palpitation  . Celebrex [Celecoxib]     Gi upset   . Hydrochlorothiazide     cramping  . 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 Prilosec [Omeprazole]     constipation     Review of Systems:  Review of Systems  Constitutional: Negative for fever, chills and malaise/fatigue.  HENT: Negative for congestion, ear pain and sore throat.   Respiratory: Negative for cough, shortness of breath and wheezing.   Cardiovascular: Negative for chest pain, palpitations and leg swelling.  Gastrointestinal: Negative for heartburn, abdominal pain, diarrhea, constipation, blood in stool and melena.  Genitourinary: Negative.   Musculoskeletal: Positive for back pain.  Skin: Negative.   Neurological: Negative for dizziness, sensory change, loss of consciousness and headaches.  Psychiatric/Behavioral: Negative for depression. The patient is not nervous/anxious and does not have insomnia.     Family history- Review and unchanged  Social history- Review and unchanged  Physical Exam: BP 134/80 mmHg  Pulse 56  Temp(Src) 98 F (36.7 C) (Temporal)  Resp 16  Ht 5\' 5"  (1.651 m)  Wt 142 lb (64.411 kg)  BMI 23.63 kg/m2 Wt Readings from Last 3 Encounters:  09/04/15 142 lb (64.411 kg)  05/22/15 141 lb 9.6 oz (64.229 kg)  02/19/15 140 lb (63.504 kg)    General Appearance: Well nourished well developed, in no apparent distress. Eyes: PERRLA, EOMs, conjunctiva no swelling or erythema ENT/Mouth: Ear canals normal without obstruction, swelling, erythma, discharge.  TMs normal bilaterally.  Oropharynx moist, clear, without exudate, or postoropharyngeal swelling. Neck: Supple, thyroid normal,no cervical adenopathy  Respiratory: Respiratory effort normal, Breath sounds clear A&P without rhonchi, wheeze, or rale.  No retractions, no accessory  usage. Cardio: RRR with no MRGs. Brisk peripheral pulses without edema.  Abdomen: Soft, + BS,  Non tender, no guarding, rebound, hernias, masses. Musculoskeletal: Full ROM, 5/5 strength, Normal gait Skin: Warm, dry without rashes, lesions, ecchymosis.  Neuro: Awake and oriented X 3, Cranial nerves intact. Normal muscle tone, no cerebellar symptoms. Psych: Normal affect, Insight and Judgment appropriate.    Terri Piedraourtney Forcucci, PA-C 4:22 PM [facility]

## 2015-09-05 LAB — BASIC METABOLIC PANEL WITH GFR
BUN: 16 mg/dL (ref 7–25)
CO2: 24 mmol/L (ref 20–31)
Calcium: 9.2 mg/dL (ref 8.6–10.4)
Chloride: 101 mmol/L (ref 98–110)
Creat: 0.71 mg/dL (ref 0.60–0.93)
GFR, Est African American: >89 mL/min (ref >=60)
GFR, Est Non African American: 85 mL/min (ref >=60)
Glucose, Bld: 76 mg/dL (ref 65–99)
Potassium: 3.7 mmol/L (ref 3.5–5.3)
Sodium: 139 mmol/L (ref 135–146)

## 2015-09-05 LAB — TSH: TSH: 2.87 mIU/L

## 2015-09-05 LAB — HEPATIC FUNCTION PANEL
ALT: 22 U/L (ref 6–29)
AST: 24 U/L (ref 10–35)
Albumin: 4.4 g/dL (ref 3.6–5.1)
Alkaline Phosphatase: 54 U/L (ref 33–130)
Bilirubin, Direct: 0.1 mg/dL (ref <=0.2)
Indirect Bilirubin: 0.5 mg/dL (ref 0.2–1.2)
Total Bilirubin: 0.6 mg/dL (ref 0.2–1.2)
Total Protein: 6.2 g/dL (ref 6.1–8.1)

## 2015-09-05 LAB — LIPID PANEL
Cholesterol: 136 mg/dL (ref 125–200)
HDL: 66 mg/dL (ref >=46)
LDL Cholesterol: 55 mg/dL (ref <130)
Total CHOL/HDL Ratio: 2.1 Ratio (ref <=5.0)
Triglycerides: 76 mg/dL (ref <150)
VLDL: 15 mg/dL (ref <30)

## 2015-09-05 LAB — HEMOGLOBIN A1C
Hgb A1c MFr Bld: 5.9 % — ABNORMAL HIGH (ref <5.7)
Mean Plasma Glucose: 123 mg/dL

## 2015-11-07 ENCOUNTER — Other Ambulatory Visit: Payer: Self-pay | Admitting: Internal Medicine

## 2015-11-27 ENCOUNTER — Ambulatory Visit (INDEPENDENT_AMBULATORY_CARE_PROVIDER_SITE_OTHER): Payer: Medicare Other | Admitting: Cardiovascular Disease

## 2015-11-27 ENCOUNTER — Encounter: Payer: Self-pay | Admitting: Cardiovascular Disease

## 2015-11-27 VITALS — BP 100/62 | HR 56 | Ht 65.0 in | Wt 143.1 lb

## 2015-11-27 DIAGNOSIS — I10 Essential (primary) hypertension: Secondary | ICD-10-CM | POA: Diagnosis not present

## 2015-11-27 DIAGNOSIS — I341 Nonrheumatic mitral (valve) prolapse: Secondary | ICD-10-CM | POA: Diagnosis not present

## 2015-11-27 DIAGNOSIS — I7300 Raynaud's syndrome without gangrene: Secondary | ICD-10-CM

## 2015-11-27 DIAGNOSIS — G4733 Obstructive sleep apnea (adult) (pediatric): Secondary | ICD-10-CM

## 2015-11-27 DIAGNOSIS — I2510 Atherosclerotic heart disease of native coronary artery without angina pectoris: Secondary | ICD-10-CM | POA: Diagnosis not present

## 2015-11-27 DIAGNOSIS — E782 Mixed hyperlipidemia: Secondary | ICD-10-CM

## 2015-11-27 NOTE — Patient Instructions (Signed)

## 2015-11-29 NOTE — Progress Notes (Signed)
Patient ID: Jasmine Reeves, female   DOB: 09/10/1940, 75 y.o.   MRN: [id number]     HPI: Jasmine Reeves is a 75 y.o. female presents for 42 month cardiology evaluation.  Jasmine Reeves underwent CABG revascularization surgery x4 on 03/02/2000 by Dr. Roxy Manns with a LIMA placed her distal LAD, vein to the second diagonal, sequential vein to 2 marginal vessels arising from the circumflex coronary artery. Her RCA was free of disease and was not bypassed. Additional problems include mitral valve prolapse, Raynaud's disease, history of short bursts of nonsustained tachycardia on treadmill testing with reference to this on beta blocker therapy.,. She did develop edema remotely on amlodipine. She hasa history of documented APCs on Holter imaging which have improved with labetalol.  She underwent a nuclear perfusion study on 09/07/2013.  She was noted to have ST segment changes  on ECG but nuclear imaging showed apical thinning with breast attenuation, without ischemia.  She had normal wall motion.  She denies any awareness of palpitations.  She does have issues with some arthritic symptoms.  She takes diltiazem 180 mg in addition to losartan 25 mg, Normodyne 150 mg twice a day and HCTZ 25 mg.  She is on Vytorin 10/20 for hyperlipidemia.  She tells me she was referred for sleep study which was done in [place] after a home study was abnormal.  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  She is not yet followed up with reference to this. Epworth sleepiness scale score was increased at 16.  CPAP was recommended but this has not yet been initiated.  The patient was not aware that I also am involved in sleep medicine. I reviewed with her current Medicare guidelines.  Lab work by her primary physician in 2016 revealed fasting glucose of 110.  She had normal renal function.  LFTs were normal.  Lipid studies were  excellent with a total cholesterol 145, triglycerides 57, HDL 62, and LDL 72.  Hemoglobin A1c was minimally increased at 5.8.  TSH was 3.029.  Over the past year, Jasmine Reeves has been without chest pain.  She has degenerative scoliosis.  She has GERD.  She was started on CPAP therapy and [employer] is her DME.  She denies PND, orthopnea.  She denies palpitations.  Past Medical History:  Diagnosis Date  . COPD (chronic obstructive pulmonary disease) ([place])   . Fibromyalgia   . GERD (gastroesophageal reflux disease)   . IBS (irritable bowel syndrome)   . Scoliosis     Past Surgical History:  Procedure Laterality Date  . ABDOMINAL HYSTERECTOMY    . APPENDECTOMY    . CORONARY ANGIOPLASTY  12/11  . CORONARY ARTERY BYPASS GRAFT    . ESOPHAGOGASTRODUODENOSCOPY  07/21/12   polyps, Hiatal hernia Dr. Cristina Gong  . TONSILLECTOMY    . TUBAL LIGATION      Allergies  Allergen Reactions  . Altace [Ramipril]     cough  . Azithromycin     palpitation  . Celebrex [Celecoxib]     Gi upset   . Hydrochlorothiazide     cramping  . 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 Prilosec [Omeprazole]     constipation    Current Outpatient Prescriptions  Medication Sig Dispense Refill  . aspirin 81 MG tablet Take 81 mg by mouth daily.    . Calcium Carb-Cholecalciferol (CALCIUM 1000 + D PO) Take 1 tablet  by mouth daily.    . Cholecalciferol (VITAMIN D) 2000 UNITS tablet Take 2,000 Units by mouth 2 (two) times daily.    Marland Kitchen DILT-XR 180 MG 24 hr capsule TAKE 1 CAPSULE BY MOUTH DAILY 90 capsule 0  . ezetimibe-simvastatin (VYTORIN) 10-20 MG tablet TAKE 1 TABLET BY MOUTH EVERY DAY 90 tablet 0  . fexofenadine (ALLEGRA) 180 MG tablet Take 180 mg by mouth daily.    . fluticasone (VERAMYST) 27.5 MCG/SPRAY nasal spray Place 2 sprays into the nose daily as needed for rhinitis. 10 g 8  . hydrochlorothiazide (HYDRODIURIL) 25 MG tablet Take 12.5 mg by mouth daily.    . IRON PO Take 50 mg by  mouth daily.    Marland Kitchen labetalol (NORMODYNE) 100 MG tablet TAKE 1 AND 1/2 TABLETS BY MOUTH TWICE DAILY 270 tablet 0  . losartan (COZAAR) 50 MG tablet TAKE 1/2-1 TABLET BY MOUTH EVERY DAY FOR BLOOD PRESSURE 90 tablet 0  . Magnesium 400 MG CAPS Take by mouth daily.    . Multiple Vitamins-Minerals (MULTIVITAMIN WITH MINERALS) tablet Take 1 tablet by mouth daily.    . nitroGLYCERIN (NITROSTAT) 0.4 MG SL tablet Place 1 tablet (0.4 mg total) under the tongue every 5 (five) minutes as needed for chest pain. 25 tablet 12  . pantoprazole (PROTONIX) 40 MG tablet TAKE 1 TABLET BY MOUTH TWICE DAILY 0.5 HOUR BEFORE BREAKFAST AND DINNER 180 tablet 0  . vitamin C (ASCORBIC ACID) 500 MG tablet Take 500 mg by mouth daily.     No current facility-administered medications for this visit.     Social History   Social History  . Marital status: Married    Spouse name: N/A  . Number of children: N/A  . Years of education: N/A   Occupational History  . Not on file.   Social History Main Topics  . Smoking status: Former Smoker    Quit date: 09/03/1965  . Smokeless tobacco: Not on file  . Alcohol use 1.8 oz/week    3 Standard drinks or equivalent per week     Comment: rare  . Drug use: No  . Sexual activity: Not on file   Other Topics Concern  . Not on file   Social History Narrative  . No narrative on file   Socially she is married has 2 children 3 grandchildren. She does not routinely exercise. There is no tobacco use. She does drink occasional alcohol. She is retired Systems analyst.  Family History  Problem Relation Age of Onset  . Heart disease Brother   . Hyperlipidemia Brother    ROS General: Negative; No fevers, chills, or night sweats;  HEENT: Negative; No changes in vision or hearing, sinus congestion, difficulty swallowing Pulmonary: Negative; No cough, wheezing, shortness of breath, hemoptysis Cardiovascular: Negative; No chest pain, presyncope, syncope, palpatations GI:  Negative; No nausea, vomiting, diarrhea, or abdominal pain GU: Negative; No dysuria, hematuria, or difficulty voiding Musculoskeletal: Positive for a Raynaud's; no myalgias, joint pain, or weakness Hematologic/Oncology: Negative; no easy bruising, bleeding Endocrine: Negative; no heat/cold intolerance; no diabetes Neuro: Negative; no changes in balance, headaches Skin: Negative; No rashes or skin lesions Psychiatric: Negative; No behavioral problems, depression Sleep: Positive for recently diagnosed obstructive sleep apnea.  CPAP therapy not yet instituted.   Other comprehensive 14 point system review is negative.   PE BP 100/62 (BP Location: Right Arm, Patient Position: Sitting, Cuff Size: Normal)   Pulse (!) 56   Ht _0  (1.651 m)   Wt  143 lb 2 oz (64.9 kg)   BMI 23.82 kg/m    Repeat blood pressure by me was 110/70.  Wt Readings from Last 3 Encounters:  11/27/15 143 lb 2 oz (64.9 kg)  09/04/15 142 lb (64.4 kg)  05/22/15 141 lb 9.6 oz (64.2 kg)   General: Alert, oriented, no distress.  Skin: normal turgor, no rashes HEENT: Normocephalic, atraumatic. Pupils round and reactive; sclera anicteric;no lid lag.  Nose without nasal septal hypertrophy Mouth/Parynx benign; Mallinpatti scale 2 Neck: No JVD, no carotid bruits with normal carotid upstroke Lungs: clear to ausculatation and percussion; no wheezing or rales Chest wall: Nontender to palpation Heart: RRR, s1 s2 normal a 1/6 systolic murmur, soft systolic click.  No S3 or S4 gallop.  No diastolic murmur, rubs, thrills or heaves Abdomen: soft, nontender; no hepatosplenomehaly, BS+; abdominal aorta nontender and not dilated by palpation. Back: No CVA tenderness Pulses 2+ Extremities: no clubbing cyanosis or edema, Homan's sign negative  Neurologic: grossly nonfocal Psychologic: normal affect and mood.  ECG (independently read by me): Sinus bradycardia 56 bpm with mild sinus arrhythmia.  Nonspecific T changes.  September  2016 ECG (independently read by me): Sinus bradycardia with sinus arrhythmia.  No significant ST-T changes.  August 2015 ECG: Sinus rhythm at 51 beats per minute. Non-specific T changes.  LABS: BMP Latest Ref Rng & Units 09/04/2015 05/22/2015 02/19/2015  Glucose 65 - 99 mg/dL 76 91 99  BUN 7 - 25 mg/dL _0 Creatinine 0.60 - 0.93 mg/dL 0.71 0.75 0.70  Sodium 135 - 146 mmol/L 139 138 142  Potassium 3.5 - 5.3 mmol/L 3.7 4.0 3.8  Chloride 98 - 110 mmol/L 101 102 103  CO2 20 - 31 mmol/L _1 Calcium 8.6 - 10.4 mg/dL 9.2 9.2 9.0   Hepatic Function Latest Ref Rng & Units 09/04/2015 05/22/2015 02/19/2015  Total Protein 6.1 - 8.1 g/dL 6.2 6.6 6.3  Albumin 3.6 - 5.1 g/dL 4.4 4.3 4.4  AST 10 - 35 U/L _2 ALT 6 - 29 U/L 22 31(H) 24  Alk Phosphatase 33 - 130 U/L 54 57 53  Total Bilirubin 0.2 - 1.2 mg/dL 0.6 0.8 0.7  Bilirubin, Direct <=0.2 mg/dL 0.1 0.2 0.2   CBC Latest Ref Rng & Units 09/04/2015 05/22/2015 02/19/2015  WBC 3.8 - 10.8 K/uL 5.5 4.7 5.0  Hemoglobin 11.7 - 15.5 g/dL 12.6 13.0 13.0  Hematocrit 35.0 - 45.0 % 37.3 38.5 38.5  Platelets 140 - 400 K/uL 190 208 206   Lab Results  Component Value Date   MCV 91.0 09/04/2015   MCV 90.2 05/22/2015   MCV 91.0 02/19/2015   Lab Results  Component Value Date   TSH 2.87 09/04/2015   Lab Results  Component Value Date   HGBA1C 5.9 (H) 09/04/2015   Lipid Panel     Component Value Date/Time   CHOL 136 09/04/2015 1649   TRIG 76 09/04/2015 1649   HDL 66 09/04/2015 1649   CHOLHDL 2.1 09/04/2015 1649   VLDL 15 09/04/2015 1649   LDLCALC 55 09/04/2015 1649    RADIOLOGY: No results found.    ASSESSMENT AND PLAN: Jasmine Reeves is a 75 years old Caucasian female who underwent cardiac catheterization on New Year's Eve 2001.  Catheterization revealed severe two-vessel coronary artery disease and she underwent CABG revascularization surgery on 03/02/2000. Her last catheterization was in 2008 showed patent grafts. She has  normal LV function and mitral valve prolapse. She is not having  ectopy on low-dose beta blocker therapy.  Her last  nuclear perfusion study done last year was unchanged and continued to show normal perfusion without scar or ischemia.  She did have exercise-induced ST changes.  Her blood pressure today remains controlled on losartan 75 mg, HCTZ 12.5 mg, and long-acting Cardizem 180 mg per day.  Since she has initiated Cardizem, she has not had any symptoms due to her Raynaud's.  She continues to be on Vytorin 10/20 for hyperlipidemia.  Lipid studies in July are excellent with an LDL of 55.  Her GERD is controlled with pantoprazole.  She has scoliosis with some degenerative disease.  She is now using CPAP with 100% compliance.  She is unaware of breakthrough snoring.  She does not have residual daytime sleepiness.  I will see her in one year for reevaluation or sooner if problems arise.  Time spent: 25 minutes Troy Sine, MD, [facility]  11/29/2015 4:14 PM

## 2015-12-08 ENCOUNTER — Encounter: Payer: Self-pay | Admitting: Internal Medicine

## 2015-12-08 ENCOUNTER — Ambulatory Visit (INDEPENDENT_AMBULATORY_CARE_PROVIDER_SITE_OTHER): Payer: Medicare Other | Admitting: Internal Medicine

## 2015-12-08 VITALS — BP 104/68 | HR 60 | Temp 97.3°F | Resp 16 | Ht 65.0 in | Wt 144.4 lb

## 2015-12-08 DIAGNOSIS — Z79899 Other long term (current) drug therapy: Secondary | ICD-10-CM | POA: Diagnosis not present

## 2015-12-08 DIAGNOSIS — I10 Essential (primary) hypertension: Secondary | ICD-10-CM

## 2015-12-08 DIAGNOSIS — Z9989 Dependence on other enabling machines and devices: Secondary | ICD-10-CM | POA: Diagnosis not present

## 2015-12-08 DIAGNOSIS — E782 Mixed hyperlipidemia: Secondary | ICD-10-CM | POA: Diagnosis not present

## 2015-12-08 DIAGNOSIS — E559 Vitamin D deficiency, unspecified: Secondary | ICD-10-CM | POA: Diagnosis not present

## 2015-12-08 DIAGNOSIS — Z23 Encounter for immunization: Secondary | ICD-10-CM | POA: Diagnosis not present

## 2015-12-08 DIAGNOSIS — G4733 Obstructive sleep apnea (adult) (pediatric): Secondary | ICD-10-CM

## 2015-12-08 DIAGNOSIS — R7309 Other abnormal glucose: Secondary | ICD-10-CM

## 2015-12-08 LAB — CBC WITH DIFFERENTIAL/PLATELET
Basophils Absolute: 47 cells/uL (ref 0–200)
Basophils Relative: 1 %
Eosinophils Absolute: 94 cells/uL (ref 15–500)
Eosinophils Relative: 2 %
HCT: 38.5 % (ref 35.0–45.0)
Hemoglobin: 12.7 g/dL (ref 11.7–15.5)
Lymphocytes Relative: 24 %
Lymphs Abs: 1128 cells/uL (ref 850–3900)
MCH: 30.2 pg (ref 27.0–33.0)
MCHC: 33 g/dL (ref 32.0–36.0)
MCV: 91.7 fL (ref 80.0–100.0)
MPV: 9.3 fL (ref 7.5–12.5)
Monocytes Absolute: 376 cells/uL (ref 200–950)
Monocytes Relative: 8 %
Neutro Abs: 3055 cells/uL (ref 1500–7800)
Neutrophils Relative %: 65 %
Platelets: 198 10*3/uL (ref 140–400)
RBC: 4.2 MIL/uL (ref 3.80–5.10)
RDW: 13.2 % (ref 11.0–15.0)
WBC: 4.7 10*3/uL (ref 3.8–10.8)

## 2015-12-08 LAB — HEPATIC FUNCTION PANEL
ALT: 23 U/L (ref 6–29)
AST: 23 U/L (ref 10–35)
Albumin: 4.4 g/dL (ref 3.6–5.1)
Alkaline Phosphatase: 51 U/L (ref 33–130)
Bilirubin, Direct: 0.2 mg/dL (ref <=0.2)
Indirect Bilirubin: 0.4 mg/dL (ref 0.2–1.2)
Total Bilirubin: 0.6 mg/dL (ref 0.2–1.2)
Total Protein: 6.5 g/dL (ref 6.1–8.1)

## 2015-12-08 LAB — BASIC METABOLIC PANEL WITH GFR
BUN: 15 mg/dL (ref 7–25)
CO2: 27 mmol/L (ref 20–31)
Calcium: 9.4 mg/dL (ref 8.6–10.4)
Chloride: 102 mmol/L (ref 98–110)
Creat: 0.82 mg/dL (ref 0.60–0.93)
GFR, Est African American: 82 mL/min (ref >=60)
GFR, Est Non African American: 71 mL/min (ref >=60)
Glucose, Bld: 94 mg/dL (ref 65–99)
Potassium: 3.6 mmol/L (ref 3.5–5.3)
Sodium: 140 mmol/L (ref 135–146)

## 2015-12-08 LAB — LIPID PANEL
Cholesterol: 142 mg/dL (ref 125–200)
HDL: 70 mg/dL (ref >=46)
LDL Cholesterol: 55 mg/dL (ref <130)
Total CHOL/HDL Ratio: 2 Ratio (ref <=5.0)
Triglycerides: 84 mg/dL (ref <150)
VLDL: 17 mg/dL (ref <30)

## 2015-12-08 LAB — MAGNESIUM: Magnesium: 2 mg/dL (ref 1.5–2.5)

## 2015-12-08 LAB — TSH: TSH: 2.91 mIU/L

## 2015-12-08 NOTE — Patient Instructions (Signed)
Sleep Apnea  Sleep apnea is a sleep disorder characterized by abnormal pauses in breathing while you sleep. When your breathing pauses, the level of oxygen in your blood decreases. This causes you to move out of deep sleep and into light sleep. As a result, your quality of sleep is poor, and the system that carries your blood throughout your body (cardiovascular system) experiences stress. If sleep apnea remains untreated, the following conditions can develop:  High blood pressure (hypertension).  Coronary artery disease.  Inability to achieve or maintain an erection (impotence).  Impairment of your thought process (cognitive dysfunction). There are three types of sleep apnea: 1. Obstructive sleep apnea--Pauses in breathing during sleep because of a blocked airway. 2. Central sleep apnea--Pauses in breathing during sleep because the area of the brain that controls your breathing does not send the correct signals to the muscles that control breathing. 3. Mixed sleep apnea--A combination of both obstructive and central sleep apnea. RISK FACTORS The following risk factors can increase your risk of developing sleep apnea:  Being overweight.  Smoking.  Having narrow passages in your nose and throat.  Being of older age.  Being female.  Alcohol use.  Sedative and tranquilizer use.  Ethnicity. Among individuals younger than 35 years, African Americans are at increased risk of sleep apnea. SYMPTOMS   Difficulty staying asleep.  Daytime sleepiness and fatigue.  Loss of energy.  Irritability.  Loud, heavy snoring.  Morning headaches.  Trouble concentrating.  Forgetfulness.  Decreased interest in sex.  Unexplained sleepiness. DIAGNOSIS  In order to diagnose sleep apnea, your caregiver will perform a physical examination. A sleep study done in the comfort of your own home may be appropriate if you are otherwise healthy. Your caregiver may also recommend that you spend the  night in a sleep lab. In the sleep lab, several monitors record information about your heart, lungs, and brain while you sleep. Your leg and arm movements and blood oxygen level are also recorded. TREATMENT The following actions may help to resolve mild sleep apnea:  Sleeping on your side.   Using a decongestant if you have nasal congestion.   Avoiding the use of depressants, including alcohol, sedatives, and narcotics.   Losing weight and modifying your diet if you are overweight. There also are devices and treatments to help open your airway:  Oral appliances. These are custom-made mouthpieces that shift your lower jaw forward and slightly open your bite. This opens your airway.  Devices that create positive airway pressure. This positive pressure "splints" your airway open to help you breathe better during sleep. The following devices create positive airway pressure:  Continuous positive airway pressure (CPAP) device. The CPAP device creates a continuous level of air pressure with an air pump. The air is delivered to your airway through a mask while you sleep. This continuous pressure keeps your airway open.  Nasal expiratory positive airway pressure (EPAP) device. The EPAP device creates positive air pressure as you exhale. The device consists of single-use valves, which are inserted into each nostril and held in place by adhesive. The valves create very little resistance when you inhale but create much more resistance when you exhale. That increased resistance creates the positive airway pressure. This positive pressure while you exhale keeps your airway open, making it easier to breath when you inhale again.  Bilevel positive airway pressure (BPAP) device. The BPAP device is used mainly in patients with central sleep apnea. This device is similar to the CPAP device because   it also uses an air pump to deliver continuous air pressure through a mask. However, with the BPAP machine, the  pressure is set at two different levels. The pressure when you exhale is lower than the pressure when you inhale.  Surgery. Typically, surgery is only done if you cannot comply with less invasive treatments or if the less invasive treatments do not improve your condition. Surgery involves removing excess tissue in your airway to create a wider passage way.   This information is not intended to replace advice given to you by your health care provider. Make sure you discuss any questions you have with your health care provider.   Document Released: 02/05/2002 Document Revised: 03/08/2014 Document Reviewed: 06/24/2011 Elsevier Interactive Patient Education 2016 [employer]  ++++++++++++++++++++++++++++++ Recommend Adult Low Dose Aspirin or  coated  Aspirin 81 mg daily  To reduce risk of Colon Cancer 20 %,  Skin Cancer 26 % ,  Melanoma 46%  and  Pancreatic cancer 60% +++++++++++++++++++++++++ Vitamin D goal  is between 70-100.  Please make sure that you are taking your Vitamin D as directed.  It is very important as a natural anti-inflammatory  helping hair, skin, and nails, as well as reducing stroke and heart attack risk.  It helps your bones and helps with mood. It also decreases numerous cancer risks so please take it as directed.  Low Vit D is associated with a 200-300% higher risk for CANCER  and 200-300% higher risk for HEART   ATTACK  &  STROKE.   .....................................Marland Kitchen It is also associated with higher death rate at younger ages,  autoimmune diseases like Rheumatoid arthritis, Lupus, Multiple Sclerosis.    Also many other serious conditions, like depression, Alzheimer's Dementia, infertility, muscle aches, fatigue, fibromyalgia - just to name a few. ++++++++++++++++++++ Recommend the book "The END of DIETING" by Dr Monico Hoar  & the book "The END of DIABETES " by Dr Monico Hoar At [email or web address] - get book & Audio CD's    Being diabetic has a  300% increased  risk for heart attack, stroke, cancer, and alzheimer- type vascular dementia. It is very important that you work harder with diet by avoiding all foods that are white. Avoid white rice (brown & wild rice is OK), white potatoes (sweetpotatoes in moderation is OK), White bread or wheat bread or anything made out of white flour like bagels, donuts, rolls, buns, biscuits, cakes, pastries, cookies, pizza crust, and pasta (made from white flour & egg whites) - vegetarian pasta or spinach or wheat pasta is OK. Multigrain breads like Arnold's or Pepperidge Farm, or multigrain sandwich thins or flatbreads.  Diet, exercise and weight loss can reverse and cure diabetes in the early stages.  Diet, exercise and weight loss is very important in the control and prevention of complications of diabetes which affects every system in your body, ie. Brain - dementia/stroke, eyes - glaucoma/blindness, heart - heart attack/heart failure, kidneys - dialysis, stomach - gastric paralysis, intestines - malabsorption, nerves - severe painful neuritis, circulation - gangrene & loss of a leg(s), and finally cancer and Alzheimers.    I recommend avoid fried & greasy foods,  sweets/candy, white rice (brown or wild rice or Quinoa is OK), white potatoes (sweet potatoes are OK) - anything made from white flour - bagels, doughnuts, rolls, buns, biscuits,white and wheat breads, pizza crust and traditional pasta made of white flour & egg white(vegetarian pasta or spinach or wheat pasta is OK).  Multi-grain bread is OK -  like multi-grain flat bread or sandwich thins. Avoid alcohol in excess. Exercise is also important.    Eat all the vegetables you want - avoid meat, especially red meat and dairy - especially cheese.  Cheese is the most concentrated form of trans-fats which is the worst thing to clog up our arteries. Veggie cheese is OK which can be found in the fresh produce section at [employer] or [employer] or  [employer]  +++++++++++++++++++++ DASH Eating Plan  DASH stands for "Dietary Approaches to Stop Hypertension."   The DASH eating plan is a healthy eating plan that has been shown to reduce high blood pressure (hypertension). Additional health benefits may include reducing the risk of type 2 diabetes mellitus, heart disease, and stroke. The DASH eating plan may also help with weight loss. WHAT DO I NEED TO KNOW ABOUT THE DASH EATING PLAN? For the DASH eating plan, you will follow these general guidelines:  Choose foods with a percent daily value for sodium of less than 5% (as listed on the food label).  Use salt-free seasonings or herbs instead of table salt or sea salt.  Check with your health care provider or pharmacist before using salt substitutes.  Eat lower-sodium products, often labeled as "lower sodium" or "no salt added."  Eat fresh foods.  Eat more vegetables, fruits, and low-fat dairy products.  Choose whole grains. Look for the word "whole" as the first word in the ingredient list.  Choose fish   Limit sweets, desserts, sugars, and sugary drinks.  Choose heart-healthy fats.  Eat veggie cheese   Eat more home-cooked food and less restaurant, buffet, and fast food.  Limit fried foods.  Cook foods using methods other than frying.  Limit canned vegetables. If you do use them, rinse them well to decrease the sodium.  When eating at a restaurant, ask that your food be prepared with less salt, or no salt if possible.                      WHAT FOODS CAN I EAT? Read Dr Francis Dowse Fuhrman's books on The End of Dieting & The End of Diabetes  Grains Whole grain or whole wheat bread. Brown rice. Whole grain or whole wheat pasta. Quinoa, bulgur, and whole grain cereals. Low-sodium cereals. Corn or whole wheat flour tortillas. Whole grain cornbread. Whole grain crackers. Low-sodium crackers.  Vegetables Fresh or frozen vegetables (raw, steamed, roasted, or grilled). Low-sodium  or reduced-sodium tomato and vegetable juices. Low-sodium or reduced-sodium tomato sauce and paste. Low-sodium or reduced-sodium canned vegetables.   Fruits All fresh, canned (in natural juice), or frozen fruits.  Protein Products  All fish and seafood.  Dried beans, peas, or lentils. Unsalted nuts and seeds. Unsalted canned beans.  Dairy Low-fat dairy products, such as skim or 1% milk, 2% or reduced-fat cheeses, low-fat ricotta or cottage cheese, or plain low-fat yogurt. Low-sodium or reduced-sodium cheeses.  Fats and Oils Tub margarines without trans fats. Light or reduced-fat mayonnaise and salad dressings (reduced sodium). Avocado. Safflower, olive, or canola oils. Natural peanut or almond butter.  Other Unsalted popcorn and pretzels. The items listed above may not be a complete list of recommended foods or beverages. Contact your dietitian for more options.  +++++++++++++++  WHAT FOODS ARE NOT RECOMMENDED? Grains/ White flour or wheat flour White bread. White pasta. White rice. Refined cornbread. Bagels and croissants. Crackers that contain trans fat.  Vegetables  Creamed or fried vegetables. Vegetables in a . Regular canned vegetables.  Regular canned tomato sauce and paste. Regular tomato and vegetable juices.  Fruits Dried fruits. Canned fruit in light or heavy syrup. Fruit juice.  Meat and Other Protein Products Meat in general - RED meat & White meat.  Fatty cuts of meat. Ribs, chicken wings, all processed meats as bacon, sausage, bologna, salami, fatback, hot dogs, bratwurst and packaged luncheon meats.  Dairy Whole or 2% milk, cream, half-and-half, and cream cheese. Whole-fat or sweetened yogurt. Full-fat cheeses or blue cheese. Non-dairy creamers and whipped toppings. Processed cheese, cheese spreads, or cheese curds.  Condiments Onion and garlic salt, seasoned salt, table salt, and sea salt. Canned and packaged gravies. Worcestershire sauce. Tartar sauce. Barbecue  sauce. Teriyaki sauce. Soy sauce, including reduced sodium. Steak sauce. Fish sauce. Oyster sauce. Cocktail sauce. Horseradish. Ketchup and mustard. Meat flavorings and tenderizers. Bouillon cubes. Hot sauce. Tabasco sauce. Marinades. Taco seasonings. Relishes.  Fats and Oils Butter, stick margarine, lard, shortening and bacon fat. Coconut, palm kernel, or palm oils. Regular salad dressings.  Pickles and olives. Salted popcorn and pretzels.  The items listed above may not be a complete list of foods and beverages to avoid.

## 2015-12-08 NOTE — Progress Notes (Signed)
[facility] [name], M.D.        Dyanne Carrel. Steffanie Dunn, P.A.-C       Terri Piedra, P.A.-C  [facility]                [address]                [place], [place]. [address] Telephone [phone] Telefax [phone] ______________________________________________________________________     This very nice 75 y.o. MWF presents for  month follow up with Hypertension, Hyperlipidemia, Pre-Diabetes and Vitamin D Deficiency. Patient was  Dx'd with [facility] in Aug 2016 and was titrated to 5-8 CM of CPAP and reports she uses her CPAP "100%" of the time and has improved sleep hygiene with resolution of her snoring and less daytime sleepiness and improved alertness. Other problems include GERD controlled with diet & meds. Also, she does note her reflux sx's have improved since starting on CPAP.     Patient is treated for HTN & BP has been controlled at home. Today's BP is 104/68. In Nov 2001 , she presented w/unstable preinfarction angina and had PTCA/cutting PCA by Dr Tresa Endo and then in Dec had a 2sd PCA.; She continued to be symptomatic and in Jan 2012 underwent CABG and fortunately has done well to present and is followed closely By Dr Tresa Endo. Patient has had no recent complaints of any cardiac type chest pain, palpitations, dyspnea/orthopnea/PND, dizziness, claudication, or dependent edema.     Hyperlipidemia is controlled with diet & meds. Patient denies myalgias or other med SE's. Last Lipids were at goal: Lab Results  Component Value Date   CHOL 136 09/04/2015   HDL 66 09/04/2015   LDLCALC 55 09/04/2015   TRIG 76 09/04/2015   CHOLHDL 2.1 09/04/2015      Also, the patient has history of PreDiabetes and has had no symptoms of reactive hypoglycemia, diabetic polys, paresthesias or visual blurring.  Last A1c was near goal. Lab Results  Component Value Date   HGBA1C 5.9 (H) 09/04/2015      Further, the patient also has  history of Vitamin D Deficiency and supplements vitamin D without any suspected side-effects. Last vitamin D was at goal:   Lab Results  Component Value Date   VD25OH 72 05/22/2015   Current Outpatient Prescriptions on File Prior to Visit  Medication Sig  . aspirin 81 MG tablet Take 81 mg by mouth daily.  Marland Kitchen VITAMIN D 2000 UNITS  Take 2,000 Units by mouth 2 (two) times daily.  Marland Kitchen DILT-XR 180 MG  TAKE 1 CAPSULE BY MOUTH DAILY  . ezetimibe-simvastatin  10-20 MG  TAKE 1 TABLET BY MOUTH EVERY DAY  . fexofenadine  180 MG t Take 180 mg by mouth daily.  . Fluticasone/VERAMYST  nas spry Place 2 sprays into the nose daily as needed for rhinitis.  . hctz 25 MG tablet Take 12.5 mg by mouth daily.  . IRON  Take 50 mg by mouth daily.  Marland Kitchen labetalol  100 MG tablet TAKE 1 AND 1/2 TAB TWICE DAILY  . losartan  50 MG tablet TAKE 1/2-1 TABL EVERY DAY   . Magnesium 400 MG CAPS Take by mouth daily.  . Multiple Vitamins-Minerals  Take 1 tablet by mouth daily.  Marland Kitchen NITROSTAT 0.4 MG SL tablet Place 1 tab  every 5 min as needed   . pantoprazole  40 MG tablet TAKE 1 TAB 2 x/day- usu takes 1 x/day  . vitamin C  500 MG tablet Take 500 mg by mouth daily.   Allergies  Allergen Reactions  . Altace [Ramipril]     cough  . Azithromycin     palpitation  . Celebrex [Celecoxib]     Gi upset   . Hydrochlorothiazide     cramping  . 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 Prilosec [Omeprazole]     constipation   PMHx:   Past Medical History:  Diagnosis Date  . COPD (chronic obstructive pulmonary disease) (HCC)   . Fibromyalgia   . GERD (gastroesophageal reflux disease)   . IBS (irritable bowel syndrome)   . Scoliosis    Immunization History  Administered Date(s) Administered  . Influenza, High Dose Seasonal PF 11/11/2014  . Pneumococcal Conjugate-13 04/16/2014  . Pneumococcal-Unspecified 03/01/1998, 03/12/2010  . Td 03/27/2012  . Zoster 03/02/2007   Past Surgical History:   Procedure Laterality Date  . ABDOMINAL HYSTERECTOMY    . APPENDECTOMY    . CORONARY ANGIOPLASTY  12/11  . CORONARY ARTERY BYPASS GRAFT    . ESOPHAGOGASTRODUODENOSCOPY  07/21/12   polyps, Hiatal hernia Dr. Matthias HughsBuccini  . TONSILLECTOMY    . TUBAL LIGATION     FHx:    Reviewed / unchanged  SHx:    Reviewed / unchanged  Systems Review:  Constitutional: Denies fever, chills, wt changes, headaches, insomnia, fatigue, night sweats, change in appetite. Eyes: Denies redness, blurred vision, diplopia, discharge, itchy, watery eyes.  ENT: Denies discharge, congestion, post nasal drip, epistaxis, sore throat, earache, hearing loss, dental pain, tinnitus, vertigo, sinus pain, snoring.  CV: Denies chest pain, palpitations, irregular heartbeat, syncope, dyspnea, diaphoresis, orthopnea, PND, claudication or edema. Respiratory: denies cough, dyspnea, DOE, pleurisy, hoarseness, laryngitis, wheezing.  Gastrointestinal: Denies dysphagia, odynophagia, heartburn, reflux, water brash, abdominal pain or cramps, nausea, vomiting, bloating, diarrhea, constipation, hematemesis, melena, hematochezia  or hemorrhoids. Genitourinary: Denies dysuria, frequency, urgency, nocturia, hesitancy, discharge, hematuria or flank pain. Musculoskeletal: Denies arthralgias, myalgias, stiffness, jt. swelling, pain, limping or strain/sprain.  Skin: Denies pruritus, rash, hives, warts, acne, eczema or change in skin lesion(s). Neuro: No weakness, tremor, incoordination, spasms, paresthesia or pain. Psychiatric: Denies confusion, memory loss or sensory loss. Endo: Denies change in weight, skin or hair change.  Heme/Lymph: No excessive bleeding, bruising or enlarged lymph nodes.  Physical Exam BP 104/68   Pulse 60   Temp 97.3 F (36.3 C)   Resp 16   Ht 5\' 5"  (1.651 m)   Wt 144 lb 6.4 oz (65.5 kg)   BMI 24.03 kg/m   Appears well nourished and in no distress.  Eyes: PERRLA, EOMs, conjunctiva no swelling or erythema. Sinuses:  No frontal/maxillary tenderness ENT/Mouth: EAC's clear, TM's nl w/o erythema, bulging. Nares clear w/o erythema, swelling, exudates. Oropharynx clear without erythema or exudates. Oral hygiene is good. Tongue normal, non obstructing. Hearing intact.  Neck: Supple. Thyroid nl. Car 2+/2+ without bruits, nodes or JVD. Chest: Respirations nl with BS clear & equal w/o rales, rhonchi, wheezing or stridor.  Cor: Heart sounds normal w/ regular rate and rhythm without sig. murmurs, gallops, clicks, or rubs. Peripheral pulses normal and equal  without edema.  Abdomen: Soft & bowel sounds normal. Non-tender w/o guarding, rebound, hernias, masses, or organomegaly.  Lymphatics: Unremarkable.  Musculoskeletal: Full ROM all peripheral extremities, joint stability, 5/5 strength, and normal gait.  Skin: Warm, dry without exposed rashes, lesions or ecchymosis apparent.  Neuro: Cranial nerves intact, reflexes equal bilaterally. Sensory-motor testing grossly intact. Tendon reflexes grossly intact.  Pysch: Alert & oriented x 3.  Insight and judgement nl & appropriate. No ideations.  Assessment and Plan:  1. Essential hypertension  - Continue medication, monitor blood pressure at home. Continue DASH diet. Reminder to go to the ER if any CP, SOB, nausea, dizziness, severe HA, changes vision/speech, left arm numbness and tingling and jaw pain. - TSH  2. Mixed hyperlipidemia  - Continue diet/meds, exercise,& lifestyle modifications. Continue monitor periodic cholesterol/liver & renal functions  - Lipid panel - TSH  3. Abnormal blood sugar  - Continue diet, exercise, lifestyle modifications. Monitor appropriate labs.Hemoglobin A1c - Insulin, random  4. Vitamin D deficiency  - Continue supplementation. - VITAMIN D 25 Hydroxy   5. OSA on CPAP  - continue CPAP same  6. Medication management  - CBC with Differential/Platelet - BASIC METABOLIC PANEL WITH GFR - Hepatic function panel - Magnesium  7.  Need for prophylactic vaccination and inoculation against influenza  - Flu vaccine HIGH DOSE PF (Fluzone High dose)       Recommended regular exercise, BP monitoring, weight control, and discussed med and SE's. Recommended labs to assess and monitor clinical status. Further disposition pending results of labs. Over 30 minutes of exam, counseling, chart review was performed

## 2015-12-09 LAB — INSULIN, RANDOM: Insulin: 16.2 u[IU]/mL (ref 2.0–19.6)

## 2015-12-09 LAB — VITAMIN D 25 HYDROXY (VIT D DEFICIENCY, FRACTURES): Vit D, 25-Hydroxy: 92 ng/mL (ref 30–100)

## 2015-12-09 LAB — HEMOGLOBIN A1C
Hgb A1c MFr Bld: 5.6 % (ref <5.7)
Mean Plasma Glucose: 114 mg/dL

## 2016-01-08 ENCOUNTER — Other Ambulatory Visit: Payer: Self-pay | Admitting: Internal Medicine

## 2016-01-08 ENCOUNTER — Other Ambulatory Visit: Payer: Self-pay | Admitting: Physician Assistant

## 2016-02-16 ENCOUNTER — Telehealth: Payer: Self-pay | Admitting: *Deleted

## 2016-02-16 MED ORDER — AMOXICILLIN 250 MG PO CAPS: 250.0000 mg | ORAL_CAPSULE | Freq: Three times a day (TID) | ORAL | 0 refills | Status: DC

## 2016-02-16 NOTE — Telephone Encounter (Signed)
Patient called and states she has a cough, sore throat, chest congestion and an elevated temperature of 102 degrees last night.  Per Dr Oneta RackMcKeown, send in an RX for Amoxil 250 mg.  Patient advised.

## 2016-02-18 ENCOUNTER — Ambulatory Visit (INDEPENDENT_AMBULATORY_CARE_PROVIDER_SITE_OTHER): Payer: Medicare Other | Admitting: Internal Medicine

## 2016-02-18 ENCOUNTER — Encounter: Payer: Self-pay | Admitting: Internal Medicine

## 2016-02-18 VITALS — BP 106/62 | HR 64 | Temp 98.0°F | Resp 16 | Ht 65.0 in | Wt 145.0 lb

## 2016-02-18 DIAGNOSIS — J069 Acute upper respiratory infection, unspecified: Secondary | ICD-10-CM

## 2016-02-18 MED ORDER — PROMETHAZINE-DM 6.25-15 MG/5ML PO SYRP: ORAL_SOLUTION | ORAL | 1 refills | Status: DC

## 2016-02-18 MED ORDER — DEXAMETHASONE SODIUM PHOSPHATE 10 MG/ML IJ SOLN
10.0000 mg | Freq: Once | INTRAMUSCULAR | Status: AC
  Administered 2016-02-18: 10 mg via INTRAMUSCULAR

## 2016-02-18 NOTE — Progress Notes (Signed)
HPI  Patient presents to the office for evaluation of cough, congestion.  It has been going on for 3 days.  Patient reports dry, barky, but patient reports that she has some rattling in her chest.  They also endorse chest pain, chills, fever, shortness of breath and ear congestion, and mild scratchy throat. .  They have tried mucinex and tylenol.  They report that nothing has worked.  They denies other sick contacts.  Review of Systems  Constitutional: Positive for malaise/fatigue. Negative for chills and fever.  HENT: Positive for congestion, ear pain, hearing loss and sore throat.   Respiratory: Positive for cough. Negative for sputum production, shortness of breath and wheezing.   Cardiovascular: Negative for chest pain, palpitations and leg swelling.  Neurological: Positive for headaches.    PE:  Vitals:   02/18/16 1446  BP: 106/62  Pulse: 64  Resp: 16  Temp: 98 F (36.7 C)   General:  Alert and non-toxic, WDWN, NAD HEENT: NCAT, PERLA, EOM normal, no occular discharge or erythema.  Nasal mucosal edema with sinus tenderness to palpation.  Oropharynx clear with minimal oropharyngeal edema and erythema.  Mucous membranes moist and pink. Neck:  Cervical adenopathy Chest:  RRR no MRGs.  Lungs clear to auscultation A&P with no wheezes rhonchi or rales.   Abdomen: +BS x 4 quadrants, soft, non-tender, no guarding, rigidity, or rebound. Skin: warm and dry no rash Neuro: A&Ox4, CN II-XII grossly intact  Assessment and Plan:   1. Acute URI -amoxicillin -flonase -nasal saline -astelin -allegra -phenergan dm - dexamethasone (DECADRON) injection 10 mg; Inject 1 mL (10 mg total) into the muscle once.

## 2016-03-11 ENCOUNTER — Ambulatory Visit: Payer: Self-pay | Admitting: Physician Assistant

## 2016-03-15 ENCOUNTER — Encounter: Payer: Self-pay | Admitting: Physician Assistant

## 2016-03-15 ENCOUNTER — Ambulatory Visit (INDEPENDENT_AMBULATORY_CARE_PROVIDER_SITE_OTHER): Payer: Medicare Other | Admitting: Physician Assistant

## 2016-03-15 VITALS — BP 124/82 | HR 73 | Temp 97.0°F | Ht 65.0 in | Wt 147.8 lb

## 2016-03-15 DIAGNOSIS — G4733 Obstructive sleep apnea (adult) (pediatric): Secondary | ICD-10-CM

## 2016-03-15 DIAGNOSIS — I25118 Atherosclerotic heart disease of native coronary artery with other forms of angina pectoris: Secondary | ICD-10-CM

## 2016-03-15 DIAGNOSIS — E782 Mixed hyperlipidemia: Secondary | ICD-10-CM

## 2016-03-15 DIAGNOSIS — I7300 Raynaud's syndrome without gangrene: Secondary | ICD-10-CM

## 2016-03-15 DIAGNOSIS — R7309 Other abnormal glucose: Secondary | ICD-10-CM

## 2016-03-15 DIAGNOSIS — K589 Irritable bowel syndrome without diarrhea: Secondary | ICD-10-CM

## 2016-03-15 DIAGNOSIS — Z0001 Encounter for general adult medical examination with abnormal findings: Secondary | ICD-10-CM | POA: Diagnosis not present

## 2016-03-15 DIAGNOSIS — J449 Chronic obstructive pulmonary disease, unspecified: Secondary | ICD-10-CM

## 2016-03-15 DIAGNOSIS — Z79899 Other long term (current) drug therapy: Secondary | ICD-10-CM

## 2016-03-15 DIAGNOSIS — R6889 Other general symptoms and signs: Secondary | ICD-10-CM | POA: Diagnosis not present

## 2016-03-15 DIAGNOSIS — Z0000 Encounter for general adult medical examination without abnormal findings: Secondary | ICD-10-CM

## 2016-03-15 DIAGNOSIS — K219 Gastro-esophageal reflux disease without esophagitis: Secondary | ICD-10-CM

## 2016-03-15 DIAGNOSIS — I341 Nonrheumatic mitral (valve) prolapse: Secondary | ICD-10-CM

## 2016-03-15 DIAGNOSIS — I10 Essential (primary) hypertension: Secondary | ICD-10-CM

## 2016-03-15 DIAGNOSIS — E559 Vitamin D deficiency, unspecified: Secondary | ICD-10-CM

## 2016-03-15 DIAGNOSIS — Z9989 Dependence on other enabling machines and devices: Secondary | ICD-10-CM

## 2016-03-15 LAB — CBC WITH DIFFERENTIAL/PLATELET
Basophils Absolute: 56 cells/uL (ref 0–200)
Basophils Relative: 1 %
Eosinophils Absolute: 280 cells/uL (ref 15–500)
Eosinophils Relative: 5 %
HCT: 38.9 % (ref 35.0–45.0)
Hemoglobin: 12.9 g/dL (ref 11.7–15.5)
Lymphocytes Relative: 23 %
Lymphs Abs: 1288 cells/uL (ref 850–3900)
MCH: 30.8 pg (ref 27.0–33.0)
MCHC: 33.2 g/dL (ref 32.0–36.0)
MCV: 92.8 fL (ref 80.0–100.0)
MPV: 9 fL (ref 7.5–12.5)
Monocytes Absolute: 560 cells/uL (ref 200–950)
Monocytes Relative: 10 %
Neutro Abs: 3416 cells/uL (ref 1500–7800)
Neutrophils Relative %: 61 %
Platelets: 197 10*3/uL (ref 140–400)
RBC: 4.19 MIL/uL (ref 3.80–5.10)
RDW: 13.2 % (ref 11.0–15.0)
WBC: 5.6 10*3/uL (ref 3.8–10.8)

## 2016-03-15 NOTE — Progress Notes (Signed)
Medicare wellness and 3 month follow up  Assessment:    Essential hypertension - continue medications, DASH diet, exercise and monitor at home. Call if greater than 130/80.  - CBC with Differential/Platelet - BASIC METABOLIC PANEL WITH GFR - Hepatic function panel - TSH  Coronary artery disease due to lipid rich plaque Control blood pressure, cholesterol, glucose, increase exercise.  Continue cardio follow up   Prediabetes Discussed general issues about diabetes pathophysiology and management., Educational material distributed., Suggested low cholesterol diet., Encouraged aerobic exercise., Discussed foot care., Reminded to get yearly retinal exam. - Hemoglobin A1c - Insulin, fasting - HM DIABETES FOOT EXAM  Mixed hyperlipidemia -continue medications, check lipids, decrease fatty foods, increase activity.  - Lipid panel   Chronic obstructive pulmonary disease, unspecified COPD, unspecified chronic bronchitis type Check CXR, may need PFTs   RAYNAUDS SYNDROME monitor  Estrogen deficiency - DG Bone Density; Future   MVP (mitral valve prolapse) Monitor, continue BB  Gastroesophageal reflux disease, esophagitis presence not specified GERD- will try to get off PPI given info for taper and zantac sent in   Vitamin D deficiency - Vit D  25 hydroxy (rtn osteoporosis monitoring)  Anemia, unspecified anemia type Check CBC   Medication management - Magnesium  CPAP On CPAP, doing well, 100% of time, less day time sleepiness.    Future Appointments Date Time Provider Department Center  06/25/2016 10:00 AM Lucky CowboyWilliam McKeown, MD [facility] None    Plan:   During the course of the visit the patient was educated and counseled about appropriate screening and preventive services including:    Pneumococcal vaccine   Influenza vaccine  Td vaccine  Screening electrocardiogram  Screening mammography  Bone densitometry screening  Colorectal cancer screening  Diabetes  screening  Glaucoma screening  Nutrition counseling   Advanced directives: given information    Subjective:   Jasmine Reeves is a 76 y.o. female who presents for Medicare Annual Wellness Visit and 3 month follow up on hypertension, prediabetes, hyperlipidemia, vitamin D def.   Her blood pressure has been controlled at home, today their BP is BP: 124/82 She does workout. She denies chest pain, shortness of breath, dizziness.  Treated for URI 12/12, states still has cough.  She has a history of ASHD S/P CABG x 4 in 2002. Normal stress test 08/2013. She follows with Dr. Tresa EndoKelly. 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  She is on ASA.  Denies chest pain, exertional chest pressure/discomfort and palpitations.  She is on cholesterol medication, vytorin 10/20 and denies myalgias. Her cholesterol is at goal. The cholesterol last visit was:   Lab Results  Component Value Date   CHOL 142 12/08/2015   HDL 70 12/08/2015   LDLCALC 55 12/08/2015   TRIG 84 12/08/2015   CHOLHDL 2.0 12/08/2015   She has been working on diet and exercise for prediabetes, and denies paresthesia of the feet, polydipsia and polyuria. Last A1C in the office was:  Lab Results  Component Value Date   HGBA1C 5.6 12/08/2015   Patient is on Vitamin D supplement. BMI is Body mass index is 24.6 kg/m., she is working on diet and exercise. She is on CPAP.  Wt Readings from Last 3 Encounters:  03/15/16 147 lb 12.8 oz (67 kg)  02/18/16 145 lb (65.8 kg)  12/08/15 144 lb 6.4 oz (65.5 kg)    Names of Other Physician/Practitioners you currently use: 1. [facility]- here for primary care 2. [place], eye  doctor, last visit 03/2014 3.  Dr. Vincente [facility],2016, 2 x a year  Patient Care Team: [name], MD as PCP - General (Internal Medicine) Lennette Bihari, MD as Consulting Physician (Cardiology) Bernette Redbird, MD as Consulting Physician (Gastroenterology) Freddy Finner, MD as  Consulting Physician (Obstetrics and Gynecology)  Medication Review Current Outpatient Prescriptions on File Prior to Visit  Medication Sig Dispense Refill  . aspirin 81 MG tablet Take 81 mg by mouth daily.    Marland Kitchen CALCIUM CITRATE PO Take 500 mg by mouth daily.    . Cholecalciferol (VITAMIN D) 2000 UNITS tablet Take 2,000 Units by mouth 2 (two) times daily.    Marland Kitchen DILT-XR 180 MG 24 hr capsule TAKE 1 CAPSULE BY MOUTH DAILY 90 capsule 1  . ezetimibe-simvastatin (VYTORIN) 10-20 MG tablet TAKE 1 TABLET BY MOUTH EVERY DAY 90 tablet 0  . fexofenadine (ALLEGRA) 180 MG tablet Take 180 mg by mouth daily.    . fluticasone (VERAMYST) 27.5 MCG/SPRAY nasal spray Place 2 sprays into the nose daily as needed for rhinitis. 10 g 8  . hydrochlorothiazide (HYDRODIURIL) 25 MG tablet TAKE 1 TABLET BY MOUTH EVERY DAY FOR FLUID RETENTION 90 tablet 0  . IRON PO Take 50 mg by mouth daily.    Marland Kitchen labetalol (NORMODYNE) 100 MG tablet TAKE 1 AND 1/2 TABLETS BY MOUTH TWICE DAILY 270 tablet 0  . losartan (COZAAR) 50 MG tablet TAKE 1/2-1 TABLET BY MOUTH EVERY DAY FOR BLOOD PRESSURE 90 tablet 0  . Magnesium 400 MG CAPS Take by mouth daily.    . Multiple Vitamins-Minerals (MULTIVITAMIN WITH MINERALS) tablet Take 1 tablet by mouth daily.    . nitroGLYCERIN (NITROSTAT) 0.4 MG SL tablet Place 1 tablet (0.4 mg total) under the tongue every 5 (five) minutes as needed for chest pain. 25 tablet 12  . pantoprazole (PROTONIX) 40 MG tablet TAKE 1 TABLET BY MOUTH TWICE DAILY 30 MINUTES BEFORE BREAKFAST AND DINNER 180 tablet 0  . vitamin C (ASCORBIC ACID) 500 MG tablet Take 500 mg by mouth daily.     No current facility-administered medications on file prior to visit.     Current Problems (verified) Patient Active Problem List   Diagnosis Date Noted  . OSA on CPAP 11/27/2014  . IBS (irritable bowel syndrome) 11/11/2014  . Medicare annual wellness visit, subsequent 11/11/2014  . Medication management 04/12/2014  . Abnormal blood sugar  10/10/2013  . Mixed hyperlipidemia 10/08/2013  . CAD (coronary artery disease) 02/03/2013  . MVP (mitral valve prolapse) 02/03/2013  . Vitamin D deficiency   . GERD (gastroesophageal reflux disease)   . COPD (chronic obstructive pulmonary disease) (HCC) 02/11/2010  . Essential hypertension 01/28/2010  . RAYNAUDS SYNDROME 01/28/2010    Screening Tests Immunization History  Administered Date(s) Administered  . Influenza, High Dose Seasonal PF 11/11/2014, 12/08/2015  . Pneumococcal Conjugate-13 04/16/2014  . Pneumococcal-Unspecified 03/01/1998, 03/12/2010  . Td 03/27/2012  . Zoster 03/02/2007    Preventative care: Last colonoscopy: 06/2012 Last mammogram: 07/2015 at Dr. Donnetta Hail office Last pap smear/pelvic exam: 2014 (Dr. Jennette Kettle)  DEXA: 2017 gets at Dr. Donnetta Hail office CXR 09/2013 Stress test 08/2013 Echo 2012 Cath 2008  Prior vaccinations: TD or Tdap: 2014  Influenza: 2014 Pneumococcal: 2012 Prevnar 13: 2016 Shingles/Zostavax: 2009  Allergies Allergies  Allergen Reactions  . Altace [Ramipril]     cough  . Azithromycin     palpitation  . Celebrex [Celecoxib]     Gi upset   . Hydrochlorothiazide     cramping  .  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 Prilosec [Omeprazole]     constipation    SURGICAL HISTORY She  has a past surgical history that includes Tonsillectomy; Appendectomy; Abdominal hysterectomy; Tubal ligation; Coronary artery bypass graft; Coronary angioplasty (12/11); and Esophagogastroduodenoscopy (07/21/12). FAMILY HISTORY Her family history includes Heart disease in her brother; Hyperlipidemia in her brother. SOCIAL HISTORY She  reports that she quit smoking about 50 years ago. She does not have any smokeless tobacco history on file. She reports that she drinks about 1.8 oz of alcohol per week . She reports that she does not use drugs.  MEDICARE WELLNESS OBJECTIVES: Physical activity: Current Exercise Habits: The  patient does not participate in regular exercise at present Cardiac risk factors: Cardiac Risk Factors include: advanced age (>48men, >64 women);dyslipidemia;hypertension;sedentary lifestyle Depression/mood screen:   Depression screen [facility] 2/9 03/15/2016  Decreased Interest 0  Down, Depressed, Hopeless 0  PHQ - 2 Score 0    ADLs:  In your present state of health, do you have any difficulty performing the following activities: 03/15/2016 12/08/2015  Hearing? N N  Vision? N N  Difficulty concentrating or making decisions? N N  Walking or climbing stairs? N N  Dressing or bathing? N N  Doing errands, shopping? N N  Some recent data might be hidden     Cognitive Testing  Alert? Yes  Normal Appearance?Yes  Oriented to person? Yes  Place? Yes   Time? Yes  Recall of three objects?  Yes  Can perform simple calculations? Yes  Displays appropriate judgment?Yes  Can read the correct time from a watch face?Yes  EOL planning: Does Patient Have a Medical Advance Directive?: Yes Type of Advance Directive: Living will (daughter is RN) Does patient want to make changes to medical advance directive?: No - Patient declined  Review of Systems  Constitutional: Negative.   HENT: Negative.   Eyes: Negative.   Respiratory: Positive for cough. Negative for hemoptysis, sputum production, shortness of breath and wheezing.   Cardiovascular: Negative.   Gastrointestinal: Negative.   Genitourinary: Negative.   Musculoskeletal: Negative.   Skin: Negative.   Neurological: Negative.   Endo/Heme/Allergies: Negative.   Psychiatric/Behavioral: Negative.      Objective:   Blood pressure 124/82, pulse 73, temperature 97 F (36.1 C), height 5\' 5"  (1.651 m), weight 147 lb 12.8 oz (67 kg), SpO2 97 %. Body mass index is 24.6 kg/m.  General appearance: alert, no distress, WD/WN,  female HEENT: normocephalic, sclerae anicteric, TMs pearly, nares patent, no discharge or erythema, pharynx normal Oral cavity:  MMM, no lesions Neck: supple, no lymphadenopathy, no thyromegaly, no masses Heart: RRR, normal S1, S2, no murmurs Lungs: CTA bilaterally, no wheezes, rhonchi, or rales Abdomen: +bs, soft, non tender, non distended, no masses, no hepatomegaly, no splenomegaly Musculoskeletal: right subacromial bursa tenderness, full ROM, no swelling, no obvious deformity Extremities: no edema, no cyanosis, no clubbing Pulses: 2+ symmetric, upper and lower extremities, normal cap refill Neurological: alert, oriented x 3, CN2-12 intact, strength normal upper extremities and lower extremities, sensation normal throughout, DTRs 2+ throughout, no cerebellar signs, gait normal Psychiatric: normal affect, behavior normal, pleasant  Breast: defer Gyn: defer Rectal: defer  Medicare Attestation I have personally reviewed: The patient's medical and social history Their use of alcohol, tobacco or illicit drugs Their current medications and supplements The patient's functional ability including ADLs,fall risks, home safety risks, cognitive, and hearing and visual impairment Diet and physical activities Evidence for depression or  mood disorders  The patient's weight, height, BMI, and visual acuity have been recorded in the chart.  I have made referrals, counseling, and provided education to the patient based on review of the above and I have provided the patient with a written personalized care plan for preventive services.     Quentin Mulling, PA-C   03/15/2016

## 2016-03-15 NOTE — Patient Instructions (Signed)
[facility]  7 a.m.-6:30 p.m., Monday 7 a.m.-5 p.m., Tuesday-Friday Schedule an appointment by calling [phone].  Encourage you to get the 3D Mammogram  The 3D Mammogram is much more specific and sensitive to pick up breast cancer. For women with fibrocystic breast or lumpy breast it can be hard to determine if it is cancer or not but the 3D mammogram is able to tell this difference which cuts back on unneeded additional tests or scary call backs.   - over 40% increase in detection of breast cancer - over 40% reduction in false positives.  - fewer call backs - reduced anxiety - improved outcomes - [facility]  Common causes of cough OR hoarseness OR sore throat:   Allergies, Viral Infections, Acid Reflux and Bacterial Infections.  1) Allergies and viral infections cause a cough OR sore throat by post nasal drip and are often worse at night, can also have sneezing, lower grade fevers, clear/yellow mucus. This is best treated with allergy medications or nasal sprays.  Please get on allegra for 1-2 weeks The strongest is allegra or fexafinadine  Cheapest at [employer], [employer], [employer]  2) Bacterial infections are more severe than allergies or viral infections with fever, teeth pain, fatigue. This can be treated with prednisone and the same over the counter medication and after 7 days can be treated with an antibiotic.   3) Silent reflux/GERD can cause a cough OR sore throat OR hoarseness WITHOUT heart burn because the esophagus that goes to the stomach and trachea that goes to the lungs are very close and when you lay down the acid can irritate your throat and lungs. This can cause hoarseness, cough, and wheezing. Please stop any alcohol or anti-inflammatories like aleve/advil/ibuprofen and start an over the counter Prilosec or omeprazole 1-2 times daily 30mins before food for 2 weeks, then switch to over the counter zantac/ratinidine or pepcid/famotadine once  at night for 2 weeks.   4) sometimes irritation causes more irritation. Try voice rest, use sugar free cough drops to prevent coughing, and try to stop clearing your throat.   If you ever have a cough that does not go away after trying these things please make a follow up visit for further evaluation or we can refer you to a specialist. Or if you ever have shortness of breath or chest pain go to the ER.

## 2016-03-16 LAB — HEPATIC FUNCTION PANEL
ALT: 23 U/L (ref 6–29)
AST: 23 U/L (ref 10–35)
Albumin: 4.2 g/dL (ref 3.6–5.1)
Alkaline Phosphatase: 54 U/L (ref 33–130)
Bilirubin, Direct: 0.1 mg/dL (ref <=0.2)
Indirect Bilirubin: 0.4 mg/dL (ref 0.2–1.2)
Total Bilirubin: 0.5 mg/dL (ref 0.2–1.2)
Total Protein: 6.2 g/dL (ref 6.1–8.1)

## 2016-03-16 LAB — TSH: TSH: 2.51 mIU/L

## 2016-03-16 LAB — LIPID PANEL
Cholesterol: 139 mg/dL (ref <200)
HDL: 64 mg/dL (ref >50)
LDL Cholesterol: 56 mg/dL (ref <100)
Total CHOL/HDL Ratio: 2.2 Ratio (ref <5.0)
Triglycerides: 94 mg/dL (ref <150)
VLDL: 19 mg/dL (ref <30)

## 2016-03-16 LAB — BASIC METABOLIC PANEL WITH GFR
BUN: 18 mg/dL (ref 7–25)
CO2: 25 mmol/L (ref 20–31)
Calcium: 9.1 mg/dL (ref 8.6–10.4)
Chloride: 103 mmol/L (ref 98–110)
Creat: 0.91 mg/dL (ref 0.60–0.93)
GFR, Est African American: 72 mL/min (ref >=60)
GFR, Est Non African American: 63 mL/min (ref >=60)
Glucose, Bld: 94 mg/dL (ref 65–99)
Potassium: 3.8 mmol/L (ref 3.5–5.3)
Sodium: 141 mmol/L (ref 135–146)

## 2016-03-16 LAB — MAGNESIUM: Magnesium: 2.1 mg/dL (ref 1.5–2.5)

## 2016-03-16 LAB — VITAMIN D 25 HYDROXY (VIT D DEFICIENCY, FRACTURES): Vit D, 25-Hydroxy: 87 ng/mL (ref 30–100)

## 2016-04-07 ENCOUNTER — Other Ambulatory Visit: Payer: Self-pay | Admitting: Internal Medicine

## 2016-04-29 ENCOUNTER — Encounter: Payer: Self-pay | Admitting: Internal Medicine

## 2016-04-29 ENCOUNTER — Ambulatory Visit (INDEPENDENT_AMBULATORY_CARE_PROVIDER_SITE_OTHER): Payer: Medicare Other | Admitting: Internal Medicine

## 2016-04-29 VITALS — BP 116/62 | HR 60 | Temp 97.4°F | Resp 16 | Ht 65.0 in | Wt 148.2 lb

## 2016-04-29 DIAGNOSIS — M7750 Other enthesopathy of unspecified foot: Secondary | ICD-10-CM

## 2016-04-29 DIAGNOSIS — M778 Other enthesopathies, not elsewhere classified: Secondary | ICD-10-CM

## 2016-04-29 DIAGNOSIS — M779 Enthesopathy, unspecified: Principal | ICD-10-CM

## 2016-04-29 MED ORDER — MELOXICAM 15 MG PO TABS
ORAL_TABLET | ORAL | 1 refills | Status: DC
Start: 2016-04-29 — End: 2017-06-20

## 2016-04-29 MED ORDER — PREDNISONE 20 MG PO TABS: ORAL_TABLET | ORAL | 0 refills | Status: DC

## 2016-04-29 NOTE — Progress Notes (Signed)
Subjective:    Patient ID: Jasmine Reeves, female    DOB: 16-Nov-1940, 76 y.o.   MRN: [id number]  HPI  This very nice 76 to MWF presents with c/o pain of the mid Right foot intermittent over the the last several months, but particularly worse recently after a vacation with much greater than normal walking. She denies discrete injury.   Medication Sig  . aspirin 81 MG  Take 81 mg by mouth daily.  Marland Kitchen CALCIUM CITRATE  Take 500 mg by mouth daily.  Marland Kitchen VITAMIN D 2000 UNITS Take 2,000 Units by mouth 2 (two) times daily.  Marland Kitchen DILT-XR 180 MG 24 hr capsule TAKE 1 CAPSULE BY MOUTH DAILY  . VYTORIN 10-20 MG tablet TAKE 1 TABLET BY MOUTH EVERY DAY  . fexofenadine  180 MG tablet Take 180 mg by mouth daily.  Marland Kitchen fFLONASEnasal spray INSTILL 2 SPRAYS IN EACH NOSTRIL TWICE DAILY  . VERAMYST  nasal spray Place 2 sprays into the nose daily as needed for rhinitis.  . hctz 25 MG tablet TAKE 1 TABLET BY MOUTH EVERY DAY FOR FLUID RETENTION  . IRON PO Take 50 mg by mouth daily.  Marland Kitchen labetalol 100 MG tablet TAKE 1 AND 1/2 TABLETS BY MOUTH TWICE DAILY  . losartan  50 MG tablet TAKE 1/2-1 TABLET  EVERY DAY   . Magnesium 400 MG  Take by mouth daily.  . Multiple Vitamins-Minerals  Take 1 tablet by mouth daily.  Marland Kitchen NITROSTAT 0.4 MG SL tablet as needed for chest pain.  . pantoprazole  40 MG tablet TAKE 1 TABLET BY MOUTH TWICE DAILY 30 MINUTES BEFORE BREAKFAST AND DINNER  . vitamin C  500 MG tablet Take 500 mg by mouth daily.   Allergies  Allergen Reactions  . Altace [Ramipril]     cough  . Azithromycin     palpitation  . Celebrex [Celecoxib]     Gi upset   . Hydrochlorothiazide     cramping  . 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 Prilosec [Omeprazole]     constipation   Past Medical History:  Diagnosis Date  . COPD (chronic obstructive pulmonary disease) (HCC)   . Fibromyalgia   . GERD (gastroesophageal reflux disease)   . IBS (irritable bowel syndrome)   . Scoliosis     Review of Systems  10 point systems review negative except as above.    Objective:   Physical Exam  BP 116/62   Pulse 60   Temp 97.4 F (36.3 C)   Resp 16   Ht 5\' 5"  (1.651 m)   Wt 148 lb 3.2 oz (67.2 kg)   BMI 24.66 kg/m   MS- FROM w/o deformities. Muscle power, tone and bulk Nl. Gait Nl w/o limp. Right foot shows a medium arch with tenderness along the extensor tendon of the Right 1st toe from the base of the 1st toe to the ankle. Pain is exacerbated with hyper extension  Neuro -  Nl w/o focal abnormalities.     Assessment & Plan:   1. Extensor tendonitis of right foot  - encouraged use of shoes with better ankle support and/or use of a foot/ankle support.   - predniSONE (DELTASONE) 20 MG tablet; 1 tab 3 x day for 3 days, then 1 tab 2 x day for 3 days, then 1 tab 1 x day for 5 days  Dispense: 20 tablet; Refill: 0    and to follow if needed with   -  meloxicam (MOBIC) 15 MG tablet; Take 1/2 to 1 tablet daily on a full stomach for pain & inflammation  Dispense: 90 tablet; Refill: 1

## 2016-05-13 ENCOUNTER — Other Ambulatory Visit: Payer: Self-pay | Admitting: Physician Assistant

## 2016-05-19 ENCOUNTER — Encounter: Payer: Self-pay | Admitting: Physician Assistant

## 2016-05-19 ENCOUNTER — Ambulatory Visit (INDEPENDENT_AMBULATORY_CARE_PROVIDER_SITE_OTHER): Payer: Medicare Other | Admitting: Physician Assistant

## 2016-05-19 VITALS — BP 122/70 | HR 57 | Temp 98.1°F | Resp 14 | Ht 65.0 in | Wt 151.6 lb

## 2016-05-19 DIAGNOSIS — R350 Frequency of micturition: Secondary | ICD-10-CM | POA: Diagnosis not present

## 2016-05-19 DIAGNOSIS — R0602 Shortness of breath: Secondary | ICD-10-CM | POA: Diagnosis not present

## 2016-05-19 LAB — CBC WITH DIFFERENTIAL/PLATELET
Basophils Absolute: 60 cells/uL (ref 0–200)
Basophils Relative: 1 %
Eosinophils Absolute: 180 cells/uL (ref 15–500)
Eosinophils Relative: 3 %
HCT: 38.6 % (ref 35.0–45.0)
Hemoglobin: 13 g/dL (ref 11.7–15.5)
Lymphocytes Relative: 21 %
Lymphs Abs: 1260 cells/uL (ref 850–3900)
MCH: 31.2 pg (ref 27.0–33.0)
MCHC: 33.7 g/dL (ref 32.0–36.0)
MCV: 92.6 fL (ref 80.0–100.0)
MPV: 9.1 fL (ref 7.5–12.5)
Monocytes Absolute: 480 cells/uL (ref 200–950)
Monocytes Relative: 8 %
Neutro Abs: 4020 cells/uL (ref 1500–7800)
Neutrophils Relative %: 67 %
Platelets: 182 10*3/uL (ref 140–400)
RBC: 4.17 MIL/uL (ref 3.80–5.10)
RDW: 13.1 % (ref 11.0–15.0)
WBC: 6 10*3/uL (ref 3.8–10.8)

## 2016-05-19 MED ORDER — SULFAMETHOXAZOLE-TRIMETHOPRIM 800-160 MG PO TABS: 1.0000 | ORAL_TABLET | Freq: Two times a day (BID) | ORAL | 0 refills | Status: DC

## 2016-05-19 NOTE — Progress Notes (Signed)
Subjective:    Patient ID: Jasmine Reeves, female    DOB: 1940/12/14, 76 y.o.   MRN: [id number]  HPI 76 y.o. WF with history of ASHD s/p CABG in 2002 presents with hematuria.  She states yesterday at church, she felt funny, had initial blood in her stream, she feels tired, feels uncomfortable, chills, frequency, urgency but not needing to have to go to the bathroom. Has chronic back pain but states last night it felt different/funny. No blood on TP, no fever. She is on mobic and ASA. Has also has a little SOB and have had fatigue last 1 week, no chest pain, no coughing, some left arm pain but states this is unchanged.   Blood pressure 122/70, pulse (!) 57, temperature 98.1 F (36.7 C), resp. rate 14, height 5\' 5"  (1.651 m), weight 151 lb 9.6 oz (68.8 kg), SpO2 97 %.  Medications Current Outpatient Prescriptions on File Prior to Visit  Medication Sig  . aspirin 81 MG tablet Take 81 mg by mouth daily.  Marland Kitchen CALCIUM CITRATE PO Take 500 mg by mouth daily.  . Cholecalciferol (VITAMIN D) 2000 UNITS tablet Take 2,000 Units by mouth 2 (two) times daily.  Marland Kitchen DILT-XR 180 MG 24 hr capsule TAKE 1 CAPSULE BY MOUTH DAILY  . ezetimibe-simvastatin (VYTORIN) 10-20 MG tablet TAKE 1 TABLET BY MOUTH EVERY DAY  . fexofenadine (ALLEGRA) 180 MG tablet Take 180 mg by mouth daily.  . fluticasone (FLONASE) 50 MCG/ACT nasal spray INSTILL 2 SPRAYS IN EACH NOSTRIL TWICE DAILY  . fluticasone (VERAMYST) 27.5 MCG/SPRAY nasal spray Place 2 sprays into the nose daily as needed for rhinitis.  . hydrochlorothiazide (HYDRODIURIL) 25 MG tablet TAKE 1 TABLET BY MOUTH EVERY DAY FOR FLUID RETENTION  . IRON PO Take 50 mg by mouth daily.  Marland Kitchen labetalol (NORMODYNE) 100 MG tablet TAKE 1 AND 1/2 TABLETS BY MOUTH TWICE DAILY  . losartan (COZAAR) 50 MG tablet TAKE 1/2-1 TABLET BY MOUTH EVERY DAY FOR BLOOD PRESSURE  . Magnesium 400 MG CAPS Take by mouth daily.  . meloxicam (MOBIC) 15 MG tablet Take 1/2 to 1 tablet daily on a full stomach for  pain & inflammation  . Multiple Vitamins-Minerals (MULTIVITAMIN WITH MINERALS) tablet Take 1 tablet by mouth daily.  . nitroGLYCERIN (NITROSTAT) 0.4 MG SL tablet Place 1 tablet (0.4 mg total) under the tongue every 5 (five) minutes as needed for chest pain.  . pantoprazole (PROTONIX) 40 MG tablet TAKE 1 TABLET BY MOUTH TWICE DAILY 30 MINUTES BEFORE BREAKFAST AND DINNER  . vitamin C (ASCORBIC ACID) 500 MG tablet Take 500 mg by mouth daily.  Marland Kitchen VYTORIN 10-20 MG tablet TAKE 1 TABLET BY MOUTH EVERY DAY   No current facility-administered medications on file prior to visit.     Problem list She has Essential hypertension; RAYNAUDS SYNDROME; COPD (chronic obstructive pulmonary disease) (HCC); Vitamin D deficiency; GERD (gastroesophageal reflux disease); CAD (coronary artery disease); MVP (mitral valve prolapse); Mixed hyperlipidemia; Abnormal blood sugar; Medication management; IBS (irritable bowel syndrome); Medicare annual wellness visit, subsequent; and OSA on CPAP on her problem list.  Review of Systems  Constitutional: Positive for chills and fatigue. Negative for activity change, appetite change, diaphoresis, fever and unexpected weight change.  HENT: Negative.   Respiratory: Positive for shortness of breath. Negative for cough, chest tightness and wheezing.   Cardiovascular: Negative.  Negative for chest pain and palpitations.  Gastrointestinal: Negative.  Negative for constipation, diarrhea, nausea and vomiting.  Genitourinary: Positive for frequency and urgency. Negative  for decreased urine volume, difficulty urinating, dyspareunia, dysuria, enuresis, flank pain, genital sores, hematuria, menstrual problem, pelvic pain, vaginal bleeding and vaginal discharge.  Musculoskeletal: Positive for back pain. Negative for arthralgias, gait problem, joint swelling, myalgias, neck pain and neck stiffness.  Skin: Negative.  Negative for rash.       Objective:   Physical Exam  Constitutional: She is  oriented to person, place, and time. She appears well-developed and well-nourished.  Neck: Normal range of motion. Neck supple.  Cardiovascular: Normal rate and regular rhythm.   Pulmonary/Chest: Effort normal and breath sounds normal.  Abdominal: Soft. Bowel sounds are normal. She exhibits no distension and no mass. There is tenderness (suprapubic). There is no rebound and no guarding.  Musculoskeletal: Normal range of motion. She exhibits no tenderness.  Neurological: She is alert and oriented to person, place, and time.  Skin: Skin is warm and dry.      Assessment & Plan:  Urinary frequency No flank pain, nausea, likely UTI- if urine negative may get CT AB Patient s/p hysterectomy - Urinalysis, Routine w reflex microscopic - Urine culture - sulfamethoxazole-trimethoprim (BACTRIM DS,SEPTRA DS) 800-160 MG tablet; Take 1 tablet by mouth 2 (two) times daily.  Dispense: 14 tablet; Refill: 0  Shortness of breath Patient with CAD s/p CABG in 2002 Has had fatigue, SOB x 1 week, no CP, will check EKG/labs  - CBC with Differential/Platelet - BASIC METABOLIC PANEL WITH GFR - Hepatic function panel - EKG 12-Lead - Troponin I - CK total and CKMB (cardiac)not at [facility]   The patient was advised to call immediately if she has any concerning symptoms in the interval. The patient voices understanding of current treatment options and is in agreement with the current care plan.The patient knows to call the clinic with any problems, questions or concerns or go to the ER if any further progression of symptoms.

## 2016-05-20 LAB — URINALYSIS, ROUTINE W REFLEX MICROSCOPIC
Bilirubin Urine: NEGATIVE
Glucose, UA: NEGATIVE
Hgb urine dipstick: NEGATIVE
Ketones, ur: NEGATIVE
Nitrite: NEGATIVE
Protein, ur: NEGATIVE
Specific Gravity, Urine: 1.023 (ref 1.001–1.035)
pH: 5 (ref 5.0–8.0)

## 2016-05-20 LAB — URINE CULTURE: Organism ID, Bacteria: NO GROWTH

## 2016-05-20 LAB — BASIC METABOLIC PANEL WITH GFR
BUN: 18 mg/dL (ref 7–25)
CO2: 27 mmol/L (ref 20–31)
Calcium: 9.3 mg/dL (ref 8.6–10.4)
Chloride: 106 mmol/L (ref 98–110)
Creat: 0.88 mg/dL (ref 0.60–0.93)
GFR, Est African American: 75 mL/min (ref >=60)
GFR, Est Non African American: 65 mL/min (ref >=60)
Glucose, Bld: 107 mg/dL — ABNORMAL HIGH (ref 65–99)
Potassium: 3.7 mmol/L (ref 3.5–5.3)
Sodium: 144 mmol/L (ref 135–146)

## 2016-05-20 LAB — URINALYSIS, MICROSCOPIC ONLY
Bacteria, UA: NONE SEEN [HPF]
Casts: NONE SEEN [LPF]
Crystals: NONE SEEN [HPF]
RBC / HPF: NONE SEEN RBC/HPF (ref <=2)
Squamous Epithelial / HPF: NONE SEEN [HPF] (ref <=5)
WBC, UA: NONE SEEN WBC/HPF (ref <=5)
Yeast: NONE SEEN [HPF]

## 2016-05-20 LAB — HEPATIC FUNCTION PANEL
ALT: 29 U/L (ref 6–29)
AST: 22 U/L (ref 10–35)
Albumin: 4.2 g/dL (ref 3.6–5.1)
Alkaline Phosphatase: 51 U/L (ref 33–130)
Bilirubin, Direct: 0.1 mg/dL (ref <=0.2)
Indirect Bilirubin: 0.3 mg/dL (ref 0.2–1.2)
Total Bilirubin: 0.4 mg/dL (ref 0.2–1.2)
Total Protein: 6 g/dL — ABNORMAL LOW (ref 6.1–8.1)

## 2016-05-20 LAB — CK TOTAL AND CKMB (NOT AT ARMC)
CK, MB: 1.8 ng/mL (ref 0.0–5.0)
Relative Index: 2.5 (ref 0.0–4.0)
Total CK: 71 U/L (ref 7–177)

## 2016-05-20 LAB — TROPONIN I: Troponin I: <0.01 ng/mL (ref <=0.05)

## 2016-06-25 ENCOUNTER — Other Ambulatory Visit: Payer: Self-pay | Admitting: Internal Medicine

## 2016-06-25 ENCOUNTER — Ambulatory Visit (INDEPENDENT_AMBULATORY_CARE_PROVIDER_SITE_OTHER): Payer: Medicare Other | Admitting: Internal Medicine

## 2016-06-25 ENCOUNTER — Encounter: Payer: Self-pay | Admitting: Internal Medicine

## 2016-06-25 VITALS — BP 106/62 | HR 64 | Temp 97.3°F | Resp 16 | Ht 64.0 in | Wt 146.0 lb

## 2016-06-25 DIAGNOSIS — E559 Vitamin D deficiency, unspecified: Secondary | ICD-10-CM

## 2016-06-25 DIAGNOSIS — Z0000 Encounter for general adult medical examination without abnormal findings: Secondary | ICD-10-CM | POA: Diagnosis not present

## 2016-06-25 DIAGNOSIS — M79671 Pain in right foot: Secondary | ICD-10-CM

## 2016-06-25 DIAGNOSIS — I10 Essential (primary) hypertension: Secondary | ICD-10-CM

## 2016-06-25 DIAGNOSIS — Z1212 Encounter for screening for malignant neoplasm of rectum: Secondary | ICD-10-CM

## 2016-06-25 DIAGNOSIS — E782 Mixed hyperlipidemia: Secondary | ICD-10-CM

## 2016-06-25 DIAGNOSIS — I25118 Atherosclerotic heart disease of native coronary artery with other forms of angina pectoris: Secondary | ICD-10-CM

## 2016-06-25 DIAGNOSIS — R7303 Prediabetes: Secondary | ICD-10-CM

## 2016-06-25 DIAGNOSIS — Z0001 Encounter for general adult medical examination with abnormal findings: Secondary | ICD-10-CM

## 2016-06-25 DIAGNOSIS — Z136 Encounter for screening for cardiovascular disorders: Secondary | ICD-10-CM

## 2016-06-25 DIAGNOSIS — Z79899 Other long term (current) drug therapy: Secondary | ICD-10-CM

## 2016-06-25 DIAGNOSIS — R7309 Other abnormal glucose: Secondary | ICD-10-CM

## 2016-06-25 LAB — LIPID PANEL
Cholesterol: 135 mg/dL (ref <200)
HDL: 56 mg/dL (ref >50)
LDL Cholesterol: 64 mg/dL (ref <100)
Total CHOL/HDL Ratio: 2.4 Ratio (ref <5.0)
Triglycerides: 77 mg/dL (ref <150)
VLDL: 15 mg/dL (ref <30)

## 2016-06-25 NOTE — Patient Instructions (Signed)

## 2016-06-25 NOTE — Progress Notes (Signed)
[facility] [name], M.D.        Dyanne Carrel. Steffanie Dunn, P.A.-C [facility]                [address]                [place], [place]. [address] Telephone [phone] Telefax [phone]  Annual Screening/Preventative Visit & Comprehensive Evaluation &  Examination     This very nice 76 y.o. MWF presents for a Screening/Preventative Visit & comprehensive evaluation and management of multiple medical co-morbidities.  Patient has been followed for HTN, ASCAD, T2_NIDDM  Prediabetes, Hyperlipidemia and Vitamin D Deficiency. Patient also has OSA on CPAP using 100%with improved restorative sleep/snoring and less day time fatigue. Patient also has GERD apparently controlled with her meds.      Patient has had c/o dorsal mid lateral R foot pain for 1.5-2 months and had tried a velcro brace and been given a prednisone taper w/o benefit.       HTN predates since 1998.  In Nov  & Dec 2001 she presented with ACS rescued with PCA and in Jan 2002 she underwent CABG and has done well since. Last Myocardial scan in 2015 was negative.  Patient's BP has been controlled at home and patient denies any cardiac symptoms as chest pain, palpitations, shortness of breath, dizziness or ankle swelling. Today's BP is at goal - 106/62.      Patient's hyperlipidemia is controlled with diet and medications. Patient denies myalgias or other medication SE's. Last lipids were  Lab Results  Component Value Date   CHOL 139 03/15/2016   HDL 64 03/15/2016   LDLCALC 56 03/15/2016   TRIG 94 03/15/2016   CHOLHDL 2.2 03/15/2016      Patient has prediabetes (A1c 6.3% in 2012) and patient denies reactive hypoglycemic symptoms, visual blurring, diabetic polys, or paresthesias. Last A1c was at goal: Lab Results  Component Value Date   HGBA1C 5.6 12/08/2015      Finally, patient has history of Vitamin D Deficiency ("50 " on treatment in 2008) and last  Vitamin D was  Lab Results  Component Value Date   VD25OH 87 03/15/2016   Current Outpatient Prescriptions on File Prior to Visit  Medication Sig  . aspirin 81 MG tablet Take 81 mg by mouth daily.  Marland Kitchen CALCIUM CITRATE PO Take 500 mg by mouth daily.  . Cholecalciferol (VITAMIN D) 2000 UNITS tablet Take 2,000 Units by mouth 2 (two) times daily.  Marland Kitchen DILT-XR 180 MG 24 hr capsule TAKE 1 CAPSULE BY MOUTH DAILY  . ezetimibe-simvastatin (VYTORIN) 10-20 MG tablet TAKE 1 TABLET BY MOUTH EVERY DAY  . fexofenadine (ALLEGRA) 180 MG tablet Take 180 mg by mouth daily.  . fluticasone (FLONASE) 50 MCG/ACT nasal spray INSTILL 2 SPRAYS IN EACH NOSTRIL TWICE DAILY  . fluticasone (VERAMYST) 27.5 MCG/SPRAY nasal spray Place 2 sprays into the nose daily as needed for rhinitis.  . hydrochlorothiazide (HYDRODIURIL) 25 MG tablet TAKE 1 TABLET BY MOUTH EVERY DAY FOR FLUID RETENTION  . IRON PO Take 50 mg by mouth daily.  Marland Kitchen labetalol (NORMODYNE) 100 MG tablet TAKE 1 AND 1/2 TABLETS BY MOUTH TWICE DAILY  . losartan (COZAAR) 50 MG tablet TAKE 1/2-1 TABLET BY MOUTH EVERY DAY FOR BLOOD PRESSURE  . Magnesium 400 MG CAPS Take by mouth daily.  . meloxicam (MOBIC) 15 MG tablet Take 1/2 to 1 tablet daily on a full stomach for pain & inflammation  .  Multiple Vitamins-Minerals (MULTIVITAMIN WITH MINERALS) tablet Take 1 tablet by mouth daily.  . nitroGLYCERIN (NITROSTAT) 0.4 MG SL tablet Place 1 tablet (0.4 mg total) under the tongue every 5 (five) minutes as needed for chest pain.  . pantoprazole (PROTONIX) 40 MG tablet TAKE 1 TABLET BY MOUTH TWICE DAILY 30 MINUTES BEFORE BREAKFAST AND DINNER  . vitamin C (ASCORBIC ACID) 500 MG tablet Take 500 mg by mouth daily.   No current facility-administered medications on file prior to visit.    Allergies  Allergen Reactions  . Altace [Ramipril]     cough  . Azithromycin     palpitation  . Celebrex [Celecoxib]     Gi upset   . Hydrochlorothiazide     cramping  . Ppd [Tuberculin  Purified Protein Derivative]     Positive PPD 1998  . Prilosec [Omeprazole]     constipation   Past Medical History:  Diagnosis Date  . COPD (chronic obstructive pulmonary disease) (HCC)   . Fibromyalgia   . GERD (gastroesophageal reflux disease)   . IBS (irritable bowel syndrome)   . Scoliosis    Health Maintenance  Topic Date Due  . INFLUENZA VACCINE  09/29/2016  . MAMMOGRAM  06/11/2017  . TETANUS/TDAP  03/27/2022  . COLONOSCOPY  01/02/2025  . DEXA SCAN  Completed  . PNA vac Low Risk Adult  Completed   Immunization History  Administered Date(s) Administered  . Influenza, High Dose Seasonal PF 11/11/2014, 12/08/2015  . Pneumococcal Conjugate-13 04/16/2014  . Pneumococcal-Unspecified 03/01/1998, 03/12/2010  . Td 03/27/2012  . Zoster 03/02/2007   Past Surgical History:  Procedure Laterality Date  . ABDOMINAL HYSTERECTOMY    . APPENDECTOMY    . CORONARY ANGIOPLASTY  12/11  . CORONARY ARTERY BYPASS GRAFT    . ESOPHAGOGASTRODUODENOSCOPY  07/21/12   polyps, Hiatal hernia Dr. Matthias Hughs  . TONSILLECTOMY    . TUBAL LIGATION     Family History  Problem Relation Age of Onset  . Heart disease Brother   . Hyperlipidemia Brother    Social History  Substance Use Topics  . Smoking status: Former Smoker    Quit date: 09/03/1965  . Smokeless tobacco: Never Used  . Alcohol use 1.8 oz/week    3 Standard drinks or equivalent per week     Comment: rare    ROS Constitutional: Denies fever, chills, weight loss/gain, headaches, insomnia,  night sweats, and change in appetite. Does c/o fatigue. Eyes: Denies redness, blurred vision, diplopia, discharge, itchy, watery eyes.  ENT: Denies discharge, congestion, post nasal drip, epistaxis, sore throat, earache, hearing loss, dental pain, Tinnitus, Vertigo, Sinus pain, snoring.  Cardio: Denies chest pain, palpitations, irregular heartbeat, syncope, dyspnea, diaphoresis, orthopnea, PND, claudication, edema Respiratory: denies cough, dyspnea,  DOE, pleurisy, hoarseness, laryngitis, wheezing.  Gastrointestinal: Denies dysphagia, heartburn, reflux, water brash, pain, cramps, nausea, vomiting, bloating, diarrhea, constipation, hematemesis, melena, hematochezia, jaundice, hemorrhoids Genitourinary: Denies dysuria, frequency, urgency, nocturia, hesitancy, discharge, hematuria, flank pain Breast: Breast lumps, nipple discharge, bleeding.  Musculoskeletal: Denies arthralgia, myalgia, stiffness, Jt. Swelling, pain, limp, and strain/sprain. Denies falls. Skin: Denies puritis, rash, hives, warts, acne, eczema, changing in skin lesion Neuro: No weakness, tremor, incoordination, spasms, paresthesia, pain Psychiatric: Denies confusion, memory loss, sensory loss. Denies Depression. Endocrine: Denies change in weight, skin, hair change, nocturia, and paresthesia, diabetic polys, visual blurring, hyper / hypo glycemic episodes.  Heme/Lymph: No excessive bleeding, bruising, enlarged lymph nodes.  Physical Exam  BP 106/62   Pulse 64   Temp 97.3 F (36.3 C)  Resp 16   Ht  (1.626 m)   Wt 146 lb (66.2 kg)   BMI 25.06 kg/m   General Appearance: Well nourished, well groomed and in no apparent distress.  Eyes: PERRLA, EOMs, conjunctiva no swelling or erythema, normal fundi and vessels. Sinuses: No frontal/maxillary tenderness ENT/Mouth: EACs patent / TMs  nl. Nares clear without erythema, swelling, mucoid exudates. Oral hygiene is good. No erythema, swelling, or exudate. Tongue normal, non-obstructing. Tonsils not swollen or erythematous. Hearing normal.  Neck: Supple, thyroid normal. No bruits, nodes or JVD. Respiratory: Respiratory effort normal.  BS equal and clear bilateral without rales, rhonci, wheezing or stridor. Cardio: Heart sounds are normal with regular rate and rhythm and no murmurs, rubs or gallops. Peripheral pulses are normal and equal bilaterally without edema. No aortic or femoral bruits. Chest: Median sternotomy scar.  Symmetric with normal excursions and percussion. Breasts: Symmetric, without lumps, nipple discharge, retractions, or fibrocystic changes.  Abdomen: Flat, soft with bowel sounds active. Nontender, no guarding, rebound, hernias, masses, or organomegaly.  Lymphatics: Non tender without lymphadenopathy.   Musculoskeletal: Full ROM all peripheral extremities, joint stability, 5/5 strength, and normal gait. Skin: Warm and dry without rashes, lesions, cyanosis, clubbing or  ecchymosis.  Neuro: Cranial nerves intact, reflexes equal bilaterally. Normal muscle tone, no cerebellar symptoms. Sensation intact.  Pysch: Alert and oriented X 3, normal affect, Insight and Judgment appropriate.   Assessment and Plan  1. Annual Preventative Screening Examination   2. Essential hypertension  - Magnesium - TSH - Microalbumin / creatinine urine ratio - Urinalysis, Routine w reflex microscopic  3. Mixed hyperlipidemia  - Lipid panel - TSH  4. Prediabetes  - Hemoglobin A1c - Insulin, random  5. Vitamin D deficiency  - VITAMIN D 25 Hydroxy   6. Abnormal blood sugar   7. 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)   8. Screening for rectal cancer   9. Medication management  - Magnesium - Lipid panel - TSH - Hemoglobin A1c - Insulin, random - VITAMIN D 25 Hydroxy  - Microalbumin / creatinine urine ratio - Urinalysis, Routine w reflex microscopic  10. Screening for AAA (aortic abdominal aneurysm)  - [place], RETROPERITNL ABD,  LTD       Patient was counseled in prudent diet to achieve/maintain BMI less than 25 for weight control, BP monitoring, regular exercise and medications. Discussed med's effects and SE's. Screening labs and tests as requested with regular follow-up as recommended. Over 40 minutes of exam, counseling, chart review and high complex critical decision making was performed.

## 2016-06-26 LAB — URINALYSIS, ROUTINE W REFLEX MICROSCOPIC
Bilirubin Urine: NEGATIVE
Glucose, UA: NEGATIVE
Hgb urine dipstick: NEGATIVE
Ketones, ur: NEGATIVE
Leukocytes, UA: NEGATIVE
Nitrite: NEGATIVE
Protein, ur: NEGATIVE
Specific Gravity, Urine: 1.015 (ref 1.001–1.035)
pH: 6 (ref 5.0–8.0)

## 2016-06-26 LAB — MAGNESIUM: Magnesium: 1.9 mg/dL (ref 1.5–2.5)

## 2016-06-26 LAB — TSH: TSH: 3.66 mIU/L

## 2016-06-26 LAB — MICROALBUMIN / CREATININE URINE RATIO
Creatinine, Urine: 74 mg/dL (ref 20–320)
Microalb Creat Ratio: 3 mcg/mg creat (ref <30)
Microalb, Ur: 0.2 mg/dL

## 2016-06-26 LAB — HEMOGLOBIN A1C
Hgb A1c MFr Bld: 5.7 % — ABNORMAL HIGH (ref <5.7)
Mean Plasma Glucose: 117 mg/dL

## 2016-06-26 LAB — VITAMIN D 25 HYDROXY (VIT D DEFICIENCY, FRACTURES): Vit D, 25-Hydroxy: 87 ng/mL (ref 30–100)

## 2016-06-28 LAB — INSULIN, RANDOM: Insulin: 7.2 u[IU]/mL (ref 2.0–19.6)

## 2016-07-07 ENCOUNTER — Other Ambulatory Visit: Payer: Self-pay | Admitting: Physician Assistant

## 2016-07-15 ENCOUNTER — Ambulatory Visit (INDEPENDENT_AMBULATORY_CARE_PROVIDER_SITE_OTHER): Payer: Medicare Other | Admitting: Physician Assistant

## 2016-07-15 ENCOUNTER — Encounter: Payer: Self-pay | Admitting: Physician Assistant

## 2016-07-15 VITALS — BP 118/62 | HR 64 | Temp 97.7°F | Resp 14 | Ht 64.0 in | Wt 147.6 lb

## 2016-07-15 DIAGNOSIS — R350 Frequency of micturition: Secondary | ICD-10-CM

## 2016-07-15 DIAGNOSIS — R310 Gross hematuria: Secondary | ICD-10-CM

## 2016-07-15 NOTE — Progress Notes (Addendum)
Subjective:    Patient ID: Jasmine Reeves, female    DOB: November 02, 1940, 76 y.o.   MRN: [id number]  HPI 76 y.o. WF with history of ASHD s/p CABG in 2002 presents with possible UTI. She states that she has had urgency, frequency, blood with burning, could not sleep last night., this AM feels better but still suprapubic "feels funny." H No blood on TP, no fever. She is on mobic and ASA.   She has a floater x 2 weeks.   Blood pressure 118/62, pulse 64, temperature 97.7 F (36.5 C), resp. rate 14, height 5\' 4"  (1.626 m), weight 147 lb 9.6 oz (67 kg), SpO2 96 %.  Medications Current Outpatient Prescriptions on File Prior to Visit  Medication Sig  . aspirin 81 MG tablet Take 81 mg by mouth daily.  Marland Kitchen CALCIUM CITRATE PO Take 500 mg by mouth daily.  . Cholecalciferol (VITAMIN D) 2000 UNITS tablet Take 2,000 Units by mouth 2 (two) times daily.  Marland Kitchen DILT-XR 180 MG 24 hr capsule TAKE 1 CAPSULE BY MOUTH DAILY  . ezetimibe-simvastatin (VYTORIN) 10-20 MG tablet TAKE 1 TABLET BY MOUTH EVERY DAY  . fexofenadine (ALLEGRA) 180 MG tablet Take 180 mg by mouth daily.  . fluticasone (FLONASE) 50 MCG/ACT nasal spray INSTILL 2 SPRAYS IN EACH NOSTRIL TWICE DAILY  . fluticasone (VERAMYST) 27.5 MCG/SPRAY nasal spray Place 2 sprays into the nose daily as needed for rhinitis.  . hydrochlorothiazide (HYDRODIURIL) 25 MG tablet TAKE 1 TABLET BY MOUTH EVERY DAY FOR FLUID RETENTION  . IRON PO Take 50 mg by mouth daily.  Marland Kitchen labetalol (NORMODYNE) 100 MG tablet TAKE 1 AND 1/2 TABLETS BY MOUTH TWICE DAILY  . losartan (COZAAR) 50 MG tablet TAKE 1/2 TO 1 TABLET BY MOUTH EVERY DAY FOR BLOOD PRESSURE  . Magnesium 400 MG CAPS Take by mouth daily.  . meloxicam (MOBIC) 15 MG tablet Take 1/2 to 1 tablet daily on a full stomach for pain & inflammation  . Multiple Vitamins-Minerals (MULTIVITAMIN WITH MINERALS) tablet Take 1 tablet by mouth daily.  . nitroGLYCERIN (NITROSTAT) 0.4 MG SL tablet Place 1 tablet (0.4 mg total) under the tongue  every 5 (five) minutes as needed for chest pain.  . pantoprazole (PROTONIX) 40 MG tablet TAKE 1 TABLET BY MOUTH TWICE DAILY 30 MINUTES BEFORE BREAKFAST AND DINNER  . vitamin C (ASCORBIC ACID) 500 MG tablet Take 500 mg by mouth daily.   No current facility-administered medications on file prior to visit.     Problem list She has Essential hypertension; RAYNAUDS SYNDROME; COPD (chronic obstructive pulmonary disease) (HCC); Vitamin D deficiency; GERD (gastroesophageal reflux disease); CAD (coronary artery disease); MVP (mitral valve prolapse); Mixed hyperlipidemia; Abnormal blood sugar; Medication management; IBS (irritable bowel syndrome); Medicare annual wellness visit, subsequent; and OSA on CPAP on her problem list.  Review of Systems  Constitutional: Positive for chills and fatigue. Negative for activity change, appetite change, diaphoresis, fever and unexpected weight change.  HENT: Negative.   Respiratory: Negative for cough, chest tightness, shortness of breath and wheezing.   Cardiovascular: Negative.  Negative for chest pain and palpitations.  Gastrointestinal: Negative.  Negative for constipation, diarrhea, nausea and vomiting.  Genitourinary: Positive for dysuria, frequency, hematuria and urgency. Negative for decreased urine volume, difficulty urinating, dyspareunia, enuresis, flank pain, genital sores, menstrual problem, pelvic pain, vaginal bleeding and vaginal discharge.  Musculoskeletal: Negative for arthralgias, back pain, gait problem, joint swelling, myalgias, neck pain and neck stiffness.  Skin: Negative.  Negative for rash.  Objective:   Physical Exam  Constitutional: She is oriented to person, place, and time. She appears well-developed and well-nourished.  Neck: Normal range of motion. Neck supple.  Cardiovascular: Normal rate and regular rhythm.   Pulmonary/Chest: Effort normal and breath sounds normal.  Abdominal: Soft. Bowel sounds are normal. She exhibits no  distension and no mass. There is tenderness (suprapubic). There is no rebound and no guarding.  Musculoskeletal: Normal range of motion. She exhibits no tenderness.  Neurological: She is alert and oriented to person, place, and time.  Skin: Skin is warm and dry.      Assessment & Plan:  Urinary frequency + nausea hematuria, dysuria, likely UTI will get CT to rule out stone Patient s/p hysterectomy - Urinalysis, Routine w reflex microscopic - Urine culture Has bactrim at home   The patient was advised to call immediately if she has any concerning symptoms in the interval. The patient voices understanding of current treatment options and is in agreement with the current care plan.The patient knows to call the clinic with any problems, questions or concerns or go to the ER if any further progression of symptoms.

## 2016-07-15 NOTE — Patient Instructions (Signed)
Take the bactrim twice a day with food Increase water If urine negative will get CT SCAN If any worsening pain go to the ER  Urinary Tract Infection, Adult A urinary tract infection (UTI) is an infection of any part of the urinary tract, which includes the kidneys, ureters, bladder, and urethra. These organs make, store, and get rid of urine in the body. UTI can be a bladder infection (cystitis) or kidney infection (pyelonephritis). What are the causes? This infection may be caused by fungi, viruses, or bacteria. Bacteria are the most common cause of UTIs. This condition can also be caused by repeated incomplete emptying of the bladder during urination. What increases the risk? This condition is more likely to develop if:  You ignore your need to urinate or hold urine for long periods of time.  You do not empty your bladder completely during urination.  You wipe back to front after urinating or having a bowel movement, if you are female.  You are uncircumcised, if you are female.  You are constipated.  You have a urinary catheter that stays in place (indwelling).  You have a weak defense (immune) system.  You have a medical condition that affects your bowels, kidneys, or bladder.  You have diabetes.  You take antibiotic medicines frequently or for long periods of time, and the antibiotics no longer work well against certain types of infections (antibiotic resistance).  You take medicines that irritate your urinary tract.  You are exposed to chemicals that irritate your urinary tract.  You are female. What are the signs or symptoms? Symptoms of this condition include:  Fever.  Frequent urination or passing small amounts of urine frequently.  Needing to urinate urgently.  Pain or burning with urination.  Urine that smells bad or unusual.  Cloudy urine.  Pain in the lower abdomen or back.  Trouble urinating.  Blood in the urine.  Vomiting or being less hungry than  normal.  Diarrhea or abdominal pain.  Vaginal discharge, if you are female. How is this diagnosed? This condition is diagnosed with a medical history and physical exam. You will also need to provide a urine sample to test your urine. Other tests may be done, including:  Blood tests.  Sexually transmitted disease (STD) testing. If you have had more than one UTI, a cystoscopy or imaging studies may be done to determine the cause of the infections. How is this treated? Treatment for this condition often includes a combination of two or more of the following:  Antibiotic medicine.  Other medicines to treat less common causes of UTI.  Over-the-counter medicines to treat pain.  Drinking enough water to stay hydrated. Follow these instructions at home:  Take over-the-counter and prescription medicines only as told by your health care provider.  If you were prescribed an antibiotic, take it as told by your health care provider. Do not stop taking the antibiotic even if you start to feel better.  Avoid alcohol, caffeine, tea, and carbonated beverages. They can irritate your bladder.  Drink enough fluid to keep your urine clear or pale yellow.  Keep all follow-up visits as told by your health care provider. This is important.  Make sure to:  Empty your bladder often and completely. Do not hold urine for long periods of time.  Empty your bladder before and after sex.  Wipe from front to back after a bowel movement if you are female. Use each tissue one time when you wipe. Contact a health care provider  if:  You have back pain.  You have a fever.  You feel nauseous or vomit.  Your symptoms do not get better after 3 days.  Your symptoms go away and then return. Get help right away if:  You have severe back pain or lower abdominal pain.  You are vomiting and cannot keep down any medicines or water. This information is not intended to replace advice given to you by your  health care provider. Make sure you discuss any questions you have with your health care provider. Document Released: 11/25/2004 Document Revised: 07/30/2015 Document Reviewed: 01/06/2015 Elsevier Interactive Patient Education  2017 [employer].

## 2016-07-16 LAB — URINALYSIS, ROUTINE W REFLEX MICROSCOPIC
Bilirubin Urine: NEGATIVE
Glucose, UA: NEGATIVE
Ketones, ur: NEGATIVE
Nitrite: NEGATIVE
Specific Gravity, Urine: 1.02 (ref 1.001–1.035)
pH: 6 (ref 5.0–8.0)

## 2016-07-16 LAB — URINALYSIS, MICROSCOPIC ONLY
Bacteria, UA: NONE SEEN [HPF]
Casts: NONE SEEN [LPF]
Crystals: NONE SEEN [HPF]
WBC, UA: >60 WBC/HPF — AB (ref <=5)
Yeast: NONE SEEN [HPF]

## 2016-07-16 NOTE — Addendum Note (Signed)
Addended by: Quentin MullingOLLIER, Vaniya Augspurger R on: 07/16/2016 07:07 AM   Modules accepted: Orders

## 2016-07-17 LAB — URINE CULTURE

## 2016-07-18 ENCOUNTER — Other Ambulatory Visit: Payer: Self-pay | Admitting: Physician Assistant

## 2016-07-18 DIAGNOSIS — R350 Frequency of micturition: Secondary | ICD-10-CM

## 2016-07-20 ENCOUNTER — Other Ambulatory Visit: Payer: Self-pay | Admitting: Internal Medicine

## 2016-07-20 DIAGNOSIS — N3000 Acute cystitis without hematuria: Secondary | ICD-10-CM

## 2016-07-20 MED ORDER — CIPROFLOXACIN HCL 500 MG PO TABS: ORAL_TABLET | ORAL | 0 refills | Status: AC

## 2016-07-21 ENCOUNTER — Ambulatory Visit
Admission: RE | Admit: 2016-07-21 | Discharge: 2016-07-21 | Disposition: A | Discharge status: Home / Self Care | Payer: Medicare Other | Source: Ambulatory Visit | Attending: Physician Assistant | Admitting: Physician Assistant

## 2016-07-21 DIAGNOSIS — R310 Gross hematuria: Secondary | ICD-10-CM

## 2016-08-02 ENCOUNTER — Other Ambulatory Visit: Payer: Self-pay | Admitting: Internal Medicine

## 2016-08-02 ENCOUNTER — Other Ambulatory Visit: Payer: Self-pay | Admitting: Physician Assistant

## 2016-08-23 ENCOUNTER — Other Ambulatory Visit: Payer: Self-pay | Admitting: Physician Assistant

## 2016-08-26 ENCOUNTER — Encounter: Payer: Self-pay | Admitting: Physician Assistant

## 2016-08-26 ENCOUNTER — Ambulatory Visit (INDEPENDENT_AMBULATORY_CARE_PROVIDER_SITE_OTHER): Payer: Medicare Other | Admitting: Physician Assistant

## 2016-08-26 VITALS — BP 110/68 | HR 61 | Temp 97.6°F | Resp 16 | Ht 64.0 in | Wt 145.2 lb

## 2016-08-26 DIAGNOSIS — R350 Frequency of micturition: Secondary | ICD-10-CM

## 2016-08-26 MED ORDER — CIPROFLOXACIN HCL 500 MG PO TABS: 500.0000 mg | ORAL_TABLET | Freq: Two times a day (BID) | ORAL | 0 refills | Status: DC

## 2016-08-26 MED ORDER — FLUCONAZOLE 150 MG PO TABS: 150.0000 mg | ORAL_TABLET | Freq: Every day | ORAL | 3 refills | Status: DC

## 2016-08-26 NOTE — Patient Instructions (Signed)
Urinary Tract Infection, Adult A urinary tract infection (UTI) is an infection of any part of the urinary tract, which includes the kidneys, ureters, bladder, and urethra. These organs make, store, and get rid of urine in the body. UTI can be a bladder infection (cystitis) or kidney infection (pyelonephritis). What are the causes? This infection may be caused by fungi, viruses, or bacteria. Bacteria are the most common cause of UTIs. This condition can also be caused by repeated incomplete emptying of the bladder during urination. What increases the risk? This condition is more likely to develop if:  You ignore your need to urinate or hold urine for long periods of time.  You do not empty your bladder completely during urination.  You wipe back to front after urinating or having a bowel movement, if you are female.  You are uncircumcised, if you are female.  You are constipated.  You have a urinary catheter that stays in place (indwelling).  You have a weak defense (immune) system.  You have a medical condition that affects your bowels, kidneys, or bladder.  You have diabetes.  You take antibiotic medicines frequently or for long periods of time, and the antibiotics no longer work well against certain types of infections (antibiotic resistance).  You take medicines that irritate your urinary tract.  You are exposed to chemicals that irritate your urinary tract.  You are female.  What are the signs or symptoms? Symptoms of this condition include:  Fever.  Frequent urination or passing small amounts of urine frequently.  Needing to urinate urgently.  Pain or burning with urination.  Urine that smells bad or unusual.  Cloudy urine.  Pain in the lower abdomen or back.  Trouble urinating.  Blood in the urine.  Vomiting or being less hungry than normal.  Diarrhea or abdominal pain.  Vaginal discharge, if you are female.  How is this diagnosed? This condition is  diagnosed with a medical history and physical exam. You will also need to provide a urine sample to test your urine. Other tests may be done, including:  Blood tests.  Sexually transmitted disease (STD) testing.  If you have had more than one UTI, a cystoscopy or imaging studies may be done to determine the cause of the infections. How is this treated? Treatment for this condition often includes a combination of two or more of the following:  Antibiotic medicine.  Other medicines to treat less common causes of UTI.  Over-the-counter medicines to treat pain.  Drinking enough water to stay hydrated.  Follow these instructions at home:  Take over-the-counter and prescription medicines only as told by your health care provider.  If you were prescribed an antibiotic, take it as told by your health care provider. Do not stop taking the antibiotic even if you start to feel better.  Avoid alcohol, caffeine, tea, and carbonated beverages. They can irritate your bladder.  Drink enough fluid to keep your urine clear or pale yellow.  Keep all follow-up visits as told by your health care provider. This is important.  Make sure to: ? Empty your bladder often and completely. Do not hold urine for long periods of time. ? Empty your bladder before and after sex. ? Wipe from front to back after a bowel movement if you are female. Use each tissue one time when you wipe. Contact a health care provider if:  You have back pain.  You have a fever.  You feel nauseous or vomit.  Your symptoms do not  get better after 3 days.  Your symptoms go away and then return. Get help right away if:  You have severe back pain or lower abdominal pain.  You are vomiting and cannot keep down any medicines or water. This information is not intended to replace advice given to you by your health care provider. Make sure you discuss any questions you have with your health care provider. Document Released:  11/25/2004 Document Revised: 07/30/2015 Document Reviewed: 01/06/2015 Elsevier Interactive Patient Education  2017 [employer]  VAGINAL DRYNESS OVERVIEW  Vaginal dryness, also known as atrophic vaginitis, is a common condition in postmenopausal women. This condition is also common in women who have had both ovaries removed at the time of hysterectomy.   Some women have uncomfortable symptoms of vaginal dryness, such as pain with sex, burning vaginal discomfort or itching, or abnormal vaginal discharge, while others have no symptoms at all.  VAGINAL DRYNESS CAUSES   Estrogen helps to keep the vagina moist and to maintain thickness of the vaginal lining. Vaginal dryness occurs when the ovaries produce a decreased amount of estrogen. This can occur at certain times in a woman's life, and may be permanent or temporary. Times when less estrogen is made include: ?At the time of menopause. ?After surgical removal of the ovaries, chemotherapy, or radiation therapy of the pelvis for cancer. ?After having a baby, particularly in women who breastfeed. ?While using certain medications, such as danazol, medroxyprogesterone (brand names: Provera or DepoProvera), leuprolide (brand name: Lupron), or nafarelin. When these medications are stopped, estrogen production resumes.  Women who smoke cigarettes have been shown to have an increased risk of an earlier menopause transition as compared to non-smokers. Therefore, atrophic vaginitis symptoms may appear at a younger age in this population.  VAGINAL DRYNESS TREATMENT   There are three treatment options for women with vaginal dryness:  Vaginal lubricants and moisturizers - Vaginal lubricants and moisturizers can be purchased without a prescription. These products do not contain any hormones and have virtually no side effects. - Albolene is found in the facial cleanser section at [facility], [facility], or [facility]. It is a large jar with a blue top. This is the best  lubricant for women because it is hypoallergenic. -Natural lubricants, such as olive, avocado or peanut oil, are easily available products that may be used as a lubricant with sex.  -Vaginal moisturizes (eg, Replens, Moist Again, Vagisil, K-Y Silk-E, and Feminease) are formulated to allow water to be retained in the vaginal tissues. Moisturizers are applied into the vagina three times weekly to allow a continued moisturizing effect. These should not be used just before having sex, as they can be irritating.  Vaginal estrogen - Vaginal estrogen is the most effective treatment option for women with vaginal dryness. Vaginal estrogen must be prescribed by a healthcare provider. Very low doses of vaginal estrogen can be used when it is put into the vagina to treat vaginal dryness. A small amount of estrogen is absorbed into the bloodstream, but only about 100 times less than when using estrogen pills or tablets. As a result, there is a much lower risk of side effects, such as blood clots, breast cancer, and heart attack, compared with other estrogen-containing products (birth control pills, menopausal hormone therapy).   Ospemifene - Ospemifene is a prescription medication that is similar to estrogen, but is not estrogen. In the vaginal tissue, it acts similarly to estrogen. In the breast tissue, it acts as an estrogen blocker. It comes in a pill,  and is prescribed for women who want to use an estrogen-like medication for vaginal dryness or painful sex associated with vaginal dryness, but prefer not to use a vaginal medication. The medication may cause hot flashes as a side effect. This type of medication may increase the risk of blood clots or uterine cancer. Further study of ospemifene is needed to evaluate the risk of these complications. This medication has not been tested in women who have had breast cancer or are at a high risk of developing breast cancer.    Sexual activity - Vaginal estrogen improves  vaginal dryness quickly, usually within a few weeks. You may continue to have sex as you treat vaginal dryness because sex itself can help to keep the vaginal tissues healthy. Vaginal intercourse may help the vaginal tissues by keeping them soft and stretchable and preventing the tissues from shrinking.  If sex continues to be painful despite treatment for vaginal dryness, talk to your healthcare provider.

## 2016-08-26 NOTE — Progress Notes (Signed)
Subjective:    Patient ID: Jasmine Reeves, female    DOB: [birth year], 76 y.o.   MRN: [id number]  Back Pain  Associated symptoms include dysuria and headaches. Pertinent negatives include no chest pain, fever or pelvic pain.  Headache   Associated symptoms include back pain. Pertinent negatives include no coughing, fever, nausea, neck pain or vomiting.   76 y.o. WF with history of ASHD s/p CABG in 2002 presents with possible UTI. She states that she has had urgency, frequency, AB pain, back pain, feels bad, no dysuria, HA intermittent .  No blood on TP, no fever. She is on mobic and ASA.   Blood pressure 110/68, pulse 61, temperature 97.6 F (36.4 C), resp. rate 16, height 5\' 4"  (1.626 m), weight 145 lb 3.2 oz (65.9 kg), SpO2 98 %.  Medications Current Outpatient Prescriptions on File Prior to Visit  Medication Sig  . aspirin 81 MG tablet Take 81 mg by mouth daily.  Marland Kitchen. CALCIUM CITRATE PO Take 500 mg by mouth daily.  . Cholecalciferol (VITAMIN D) 2000 UNITS tablet Take 2,000 Units by mouth 2 (two) times daily.  Marland Kitchen. DILT-XR 180 MG 24 hr capsule TAKE 1 CAPSULE BY MOUTH DAILY  . ezetimibe-simvastatin (VYTORIN) 10-20 MG tablet TAKE 1 TABLET BY MOUTH EVERY DAY  . fexofenadine (ALLEGRA) 180 MG tablet Take 180 mg by mouth daily.  . fluticasone (FLONASE) 50 MCG/ACT nasal spray INSTILL 2 SPRAYS IN EACH NOSTRIL TWICE DAILY  . fluticasone (VERAMYST) 27.5 MCG/SPRAY nasal spray Place 2 sprays into the nose daily as needed for rhinitis.  . hydrochlorothiazide (HYDRODIURIL) 25 MG tablet TAKE 1 TABLET BY MOUTH EVERY DAY FOR FLUID RETENTION  . IRON PO Take 50 mg by mouth daily.  Marland Kitchen. labetalol (NORMODYNE) 100 MG tablet TAKE 1 AND 1/2 TABLETS BY MOUTH TWICE DAILY  . losartan (COZAAR) 50 MG tablet TAKE 1/2 TO 1 TABLET BY MOUTH EVERY DAY FOR BLOOD PRESSURE  . Magnesium 400 MG CAPS Take by mouth daily.  . meloxicam (MOBIC) 15 MG tablet Take 1/2 to 1 tablet daily on a full stomach for pain & inflammation  .  Multiple Vitamins-Minerals (MULTIVITAMIN WITH MINERALS) tablet Take 1 tablet by mouth daily.  . nitroGLYCERIN (NITROSTAT) 0.4 MG SL tablet Place 1 tablet (0.4 mg total) under the tongue every 5 (five) minutes as needed for chest pain.  . pantoprazole (PROTONIX) 40 MG tablet TAKE 1 TABLET BY MOUTH TWICE DAILY 30 MINUTES BEFORE BREAKFAST AND DINNER  . vitamin C (ASCORBIC ACID) 500 MG tablet Take 500 mg by mouth daily.  Marland Kitchen. VYTORIN 10-20 MG tablet TAKE 1 TABLET BY MOUTH EVERY DAY   No current facility-administered medications on file prior to visit.     Problem list She has Essential hypertension; RAYNAUDS SYNDROME; COPD (chronic obstructive pulmonary disease) (HCC); Vitamin D deficiency; GERD (gastroesophageal reflux disease); CAD (coronary artery disease); MVP (mitral valve prolapse); Mixed hyperlipidemia; Abnormal blood sugar; Medication management; IBS (irritable bowel syndrome); Medicare annual wellness visit, subsequent; and OSA on CPAP on her problem list.  Review of Systems  Constitutional: Positive for chills and fatigue. Negative for activity change, appetite change, diaphoresis, fever and unexpected weight change.  HENT: Negative.   Respiratory: Negative for cough, chest tightness, shortness of breath and wheezing.   Cardiovascular: Negative.  Negative for chest pain and palpitations.  Gastrointestinal: Negative.  Negative for constipation, diarrhea, nausea and vomiting.  Genitourinary: Positive for dysuria, frequency, hematuria and urgency. Negative for decreased urine volume, difficulty urinating, dyspareunia, enuresis, flank  pain, genital sores, menstrual problem, pelvic pain, vaginal bleeding and vaginal discharge.  Musculoskeletal: Positive for back pain. Negative for arthralgias, gait problem, joint swelling, myalgias, neck pain and neck stiffness.  Skin: Negative.  Negative for rash.  Neurological: Positive for headaches.       Objective:   Physical Exam  Constitutional: She  is oriented to person, place, and time. She appears well-developed and well-nourished.  Neck: Normal range of motion. Neck supple.  Cardiovascular: Normal rate and regular rhythm.   Pulmonary/Chest: Effort normal and breath sounds normal.  Abdominal: Soft. Bowel sounds are normal. She exhibits no distension and no mass. There is tenderness (suprapubic). There is no rebound and no guarding.  Musculoskeletal: Normal range of motion. She exhibits no tenderness.  Neurological: She is alert and oriented to person, place, and time.  Skin: Skin is warm and dry.      Assessment & Plan:  Urinary frequency likely UTI  Patient s/p hysterectomy - Urinalysis, Routine w reflex microscopic - Urine culture - cipro  The patient was advised to call immediately if she has any concerning symptoms in the interval. The patient voices understanding of current treatment options and is in agreement with the current care plan.The patient knows to call the clinic with any problems, questions or concerns or go to the ER if any further progression of symptoms.

## 2016-08-28 LAB — URINE CULTURE

## 2016-09-20 ENCOUNTER — Encounter: Payer: Self-pay | Admitting: Internal Medicine

## 2016-09-21 ENCOUNTER — Encounter: Payer: Self-pay | Admitting: Internal Medicine

## 2016-10-01 NOTE — Progress Notes (Signed)
3 month follow up  Assessment and Plan:    Essential hypertension - continue medications, DASH diet, exercise and monitor at home. Call if greater than 130/80.  - CBC with Differential/Platelet - BASIC METABOLIC PANEL WITH GFR - Hepatic function panel - TSH  Coronary artery disease due to lipid rich plaque Control blood pressure, cholesterol, glucose, increase exercise.  Continue cardio follow up   Prediabetes Discussed general issues about diabetes pathophysiology and management., Educational material distributed., Suggested low cholesterol diet., Encouraged aerobic exercise., Discussed foot care., Reminded to get yearly retinal exam. - Hemoglobin A1c  Mixed hyperlipidemia -continue medications, check lipids, decrease fatty foods, increase activity.  - Lipid panel   Chronic obstructive pulmonary disease, unspecified COPD, unspecified chronic bronchitis type Check CXR, may need PFTs   Medication management - Magnesium  Atherosclerosis of aorta (HCC) Control blood pressure, cholesterol, glucose, increase exercise.   Recurrent UTI -     Urinalysis, Routine w reflex microscopic -     Urine Culture  Dizziness + effusion bilateral ears and normal neuro Get on flonase If worse go to ER  Future Appointments Date Time Provider Department Center  12/08/2016 2:20 PM Lennette BihariKelly, Thomas A, [facility]  01/07/2017 10:30 AM [name], [name], MD [facility] None  07/14/2017 10:00 AM [name], [name], MD [facility] None     Subjective:   Jasmine Reeves is a 76 y.o. female who presents for 3 month follow up on hypertension, prediabetes, hyperlipidemia, vitamin D def.  She has had recurrent UTI. Had CT AB/Pelvis in May, has seen Dr. Jennette KettleNeal.  IMPRESSION: No CT findings to explain right flank pain or hematuria.  Low-density structure near the urethra and vagina. 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 Bartholin gland cyst or a Gartner duct cyst  are also in the differential diagnosis.  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  Her blood pressure has been controlled at home, today their BP is BP: 118/74 She does workout. She denies chest pain, shortness of breath, dizziness.  She has a history of ASHD S/P CABG x 4 in 2002. Normal stress test 08/2013. She follows with Dr. Tresa EndoKelly.  She is on ASA.  Denies chest pain, exertional chest pressure/discomfort and palpitations.  She is on cholesterol medication, vytorin 10/20 and denies myalgias. Her cholesterol is at goal. The cholesterol last visit was:   Lab Results  Component Value Date   CHOL 135 06/25/2016   HDL 56 06/25/2016   LDLCALC 64 06/25/2016   TRIG 77 06/25/2016   CHOLHDL 2.4 06/25/2016   She has been working on diet and exercise for prediabetes, and denies paresthesia of the feet, polydipsia and polyuria. Last A1C in the office was:  Lab Results  Component Value Date   HGBA1C 5.7 (H) 06/25/2016   Patient is on Vitamin D supplement. BMI is Body mass index is 24.96 kg/m., she is working on diet and exercise. She is on CPAP.  Wt Readings from Last 3 Encounters:  10/04/16 145 lb 6.4 oz (66 kg)  08/26/16 145 lb 3.2 oz (65.9 kg)  07/15/16 147 lb 9.6 oz (67 kg)   Medication Review Current Outpatient Prescriptions on File Prior to Visit  Medication Sig Dispense Refill  . aspirin 81 MG tablet Take 81 mg by mouth daily.    Marland Kitchen. CALCIUM CITRATE PO Take 500 mg by mouth daily.    . Cholecalciferol (VITAMIN D) 2000 UNITS tablet Take 2,000 Units by mouth 2 (two) times daily.    Marland Kitchen. DILT-XR  180 MG 24 hr capsule TAKE 1 CAPSULE BY MOUTH DAILY 90 capsule 3  . ezetimibe-simvastatin (VYTORIN) 10-20 MG tablet TAKE 1 TABLET BY MOUTH EVERY DAY 90 tablet 0  . fexofenadine (ALLEGRA) 180 MG tablet Take 180 mg by mouth daily.    . fluticasone (FLONASE) 50 MCG/ACT nasal spray INSTILL 2 SPRAYS IN EACH NOSTRIL TWICE DAILY 48 g 3  . fluticasone (VERAMYST) 27.5 MCG/SPRAY nasal spray Place 2 sprays  into the nose daily as needed for rhinitis. 10 g 8  . hydrochlorothiazide (HYDRODIURIL) 25 MG tablet TAKE 1 TABLET BY MOUTH EVERY DAY FOR FLUID RETENTION 90 tablet 3  . IRON PO Take 50 mg by mouth daily.    Marland Kitchen. labetalol (NORMODYNE) 100 MG tablet TAKE 1 AND 1/2 TABLETS BY MOUTH TWICE DAILY 270 tablet 0  . losartan (COZAAR) 50 MG tablet TAKE 1/2 TO 1 TABLET BY MOUTH EVERY DAY FOR BLOOD PRESSURE 90 tablet 1  . Magnesium 400 MG CAPS Take by mouth daily.    . meloxicam (MOBIC) 15 MG tablet Take 1/2 to 1 tablet daily on a full stomach for pain & inflammation 90 tablet 1  . Multiple Vitamins-Minerals (MULTIVITAMIN WITH MINERALS) tablet Take 1 tablet by mouth daily.    . nitroGLYCERIN (NITROSTAT) 0.4 MG SL tablet Place 1 tablet (0.4 mg total) under the tongue every 5 (five) minutes as needed for chest pain. 25 tablet 12  . pantoprazole (PROTONIX) 40 MG tablet TAKE 1 TABLET BY MOUTH TWICE DAILY 30 MINUTES BEFORE BREAKFAST AND DINNER 180 tablet 0  . vitamin C (ASCORBIC ACID) 500 MG tablet Take 500 mg by mouth daily.    Marland Kitchen. VYTORIN 10-20 MG tablet TAKE 1 TABLET BY MOUTH EVERY DAY 90 tablet 3   No current facility-administered medications on file prior to visit.     Current Problems (verified) Patient Active Problem List   Diagnosis Date Noted  . OSA on CPAP 11/27/2014  . IBS (irritable bowel syndrome) 11/11/2014  . Medicare annual wellness visit, subsequent 11/11/2014  . Medication management 04/12/2014  . Abnormal blood sugar 10/10/2013  . Mixed hyperlipidemia 10/08/2013  . CAD (coronary artery disease) 02/03/2013  . MVP (mitral valve prolapse) 02/03/2013  . Vitamin D deficiency   . GERD (gastroesophageal reflux disease)   . COPD (chronic obstructive pulmonary disease) (HCC) 02/11/2010  . Essential hypertension 01/28/2010  . 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 Altace [Ramipril]     cough  . Azithromycin     palpitation  . Celebrex [Celecoxib]     Gi  upset   . Hydrochlorothiazide     cramping  . 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 Prilosec [Omeprazole]     constipation    Review of Systems  Constitutional: Negative.   HENT: Positive for congestion and ear pain.   Eyes: Negative.   Respiratory: Negative for cough, hemoptysis, sputum production, shortness of breath and wheezing.   Cardiovascular: Negative.   Gastrointestinal: Negative.   Genitourinary: Negative.   Musculoskeletal: Negative.   Skin: Negative.   Neurological: Positive for dizziness.  Endo/Heme/Allergies: Negative.   Psychiatric/Behavioral: Negative.      Objective:   Blood pressure 118/74, pulse 65, temperature (!) 97.5 F (36.4 C), resp. rate 16, height 5\' 4"  (1.626 m), weight 145 lb 6.4 oz (66 kg), SpO2 97 %. Body mass index is 24.96 kg/m.  General appearance: alert, no distress, WD/WN,  female HEENT: normocephalic, sclerae  anicteric, TMs pearly, nares patent, no discharge or erythema, pharynx normal Oral cavity: MMM, no lesions Neck: supple, no lymphadenopathy, no thyromegaly, no masses Heart: RRR, normal S1, S2, no murmurs Lungs: CTA bilaterally, no wheezes, rhonchi, or rales Abdomen: +bs, soft, non tender, non distended, no masses, no hepatomegaly, no splenomegaly Musculoskeletal: right subacromial bursa tenderness, full ROM, no swelling, no obvious deformity. Extremities: no edema, no cyanosis, no clubbing Pulses: 2+ symmetric, upper and lower extremities, normal cap refill Neurological: alert, oriented x 3, CN2-12 intact, strength normal upper extremities and lower extremities, sensation normal throughout, DTRs 2+ throughout, no cerebellar signs, gait normal Psychiatric: normal affect, behavior normal, pleasant    Quentin Mulling, PA-C   10/04/2016

## 2016-10-04 ENCOUNTER — Encounter: Payer: Self-pay | Admitting: Physician Assistant

## 2016-10-04 ENCOUNTER — Ambulatory Visit (INDEPENDENT_AMBULATORY_CARE_PROVIDER_SITE_OTHER): Payer: Medicare Other | Admitting: Physician Assistant

## 2016-10-04 VITALS — BP 118/74 | HR 65 | Temp 97.5°F | Resp 16 | Ht 64.0 in | Wt 145.4 lb

## 2016-10-04 DIAGNOSIS — E559 Vitamin D deficiency, unspecified: Secondary | ICD-10-CM | POA: Diagnosis not present

## 2016-10-04 DIAGNOSIS — N390 Urinary tract infection, site not specified: Secondary | ICD-10-CM

## 2016-10-04 DIAGNOSIS — R7309 Other abnormal glucose: Secondary | ICD-10-CM

## 2016-10-04 DIAGNOSIS — I10 Essential (primary) hypertension: Secondary | ICD-10-CM | POA: Diagnosis not present

## 2016-10-04 DIAGNOSIS — E782 Mixed hyperlipidemia: Secondary | ICD-10-CM

## 2016-10-04 DIAGNOSIS — I700 Atherosclerosis of aorta: Secondary | ICD-10-CM

## 2016-10-04 DIAGNOSIS — J449 Chronic obstructive pulmonary disease, unspecified: Secondary | ICD-10-CM | POA: Diagnosis not present

## 2016-10-04 DIAGNOSIS — Z79899 Other long term (current) drug therapy: Secondary | ICD-10-CM | POA: Diagnosis not present

## 2016-10-04 LAB — LIPID PANEL
Cholesterol: 153 mg/dL (ref <200)
HDL: 62 mg/dL (ref >50)
LDL Cholesterol: 69 mg/dL (ref <100)
Total CHOL/HDL Ratio: 2.5 Ratio (ref <5.0)
Triglycerides: 108 mg/dL (ref <150)
VLDL: 22 mg/dL (ref <30)

## 2016-10-04 LAB — BASIC METABOLIC PANEL WITH GFR
BUN: 14 mg/dL (ref 7–25)
CO2: 25 mmol/L (ref 20–32)
Calcium: 9.5 mg/dL (ref 8.6–10.4)
Chloride: 104 mmol/L (ref 98–110)
Creat: 0.7 mg/dL (ref 0.60–0.93)
GFR, Est African American: >89 mL/min (ref >=60)
GFR, Est Non African American: 86 mL/min (ref >=60)
Glucose, Bld: 88 mg/dL (ref 65–99)
Potassium: 3.6 mmol/L (ref 3.5–5.3)
Sodium: 139 mmol/L (ref 135–146)

## 2016-10-04 LAB — CBC WITH DIFFERENTIAL/PLATELET
Basophils Absolute: 0 cells/uL (ref 0–200)
Basophils Relative: 0 %
Eosinophils Absolute: 150 cells/uL (ref 15–500)
Eosinophils Relative: 3 %
HCT: 37.9 % (ref 35.0–45.0)
Hemoglobin: 12.7 g/dL (ref 11.7–15.5)
Lymphocytes Relative: 23 %
Lymphs Abs: 1150 cells/uL (ref 850–3900)
MCH: 31.1 pg (ref 27.0–33.0)
MCHC: 33.5 g/dL (ref 32.0–36.0)
MCV: 92.9 fL (ref 80.0–100.0)
MPV: 9.1 fL (ref 7.5–12.5)
Monocytes Absolute: 450 cells/uL (ref 200–950)
Monocytes Relative: 9 %
Neutro Abs: 3250 cells/uL (ref 1500–7800)
Neutrophils Relative %: 65 %
Platelets: 206 10*3/uL (ref 140–400)
RBC: 4.08 MIL/uL (ref 3.80–5.10)
RDW: 13 % (ref 11.0–15.0)
WBC: 5 10*3/uL (ref 3.8–10.8)

## 2016-10-04 LAB — HEPATIC FUNCTION PANEL
ALT: 27 U/L (ref 6–29)
AST: 24 U/L (ref 10–35)
Albumin: 4.6 g/dL (ref 3.6–5.1)
Alkaline Phosphatase: 60 U/L (ref 33–130)
Bilirubin, Direct: 0.1 mg/dL (ref <=0.2)
Indirect Bilirubin: 0.5 mg/dL (ref 0.2–1.2)
Total Bilirubin: 0.6 mg/dL (ref 0.2–1.2)
Total Protein: 6.5 g/dL (ref 6.1–8.1)

## 2016-10-04 LAB — TSH: TSH: 3.96 mIU/L

## 2016-10-04 NOTE — Patient Instructions (Addendum)
Can do more AS needed for meloxicam, take WITH food and with zantac Can take tylenol otherwise  You can take tylenol (500mg ) or tylenol arthritis (650mg ) with the meloxicam/antiinflammatories. The max you can take of tylenol a day is 3000mg  daily, this is a max of 6 pills a day of the regular tyelnol (500mg ) or a max of 4 a day of the tylenol arthritis (650mg ) as long as no other medications you are taking contain tylenol.   Tumeric with black pepper extract is a great natural antiinflammatory that helps with arthritis and aches and pain. Can get from [employer] or any health food store. Need to take at least 800mg  twice a day with food.   Try Vionic shoes, they are at [employer], [place], and [place]  Your ears and sinuses are connected by the eustachian tube. When your sinuses are inflamed, this can close off the tube and cause fluid to collect in your middle ear. This can then cause dizziness, popping, clicking, ringing, and echoing in your ears. This is often NOT an infection and does NOT require antibiotics, it is caused by inflammation so the treatments help the inflammation. This can take a long time to get better so please be patient.  Here are things you can do to help with this: - Try the Flonase or Nasonex. Remember to spray each nostril twice towards the outer part of your eye.  Do not sniff but instead pinch your nose and tilt your head back to help the medicine get into your sinuses.  The best time to do this is at bedtime.Stop if you get blurred vision or nose bleeds.  -While drinking fluids, pinch and hold nose close and swallow, to help open eustachian tubes to drain fluid behind ear drums. -Please pick one of the over the counter allergy medications below and take it once daily for allergies.  It will also help with fluid behind ear drums. Claritin or loratadine cheapest but likely the weakest  Zyrtec or certizine at night because it can make you sleepy The strongest is allegra or  fexafinadine  Cheapest at [employer], [employer], [employer] -can use decongestant over the counter, please do not use if you have high blood pressure or certain heart conditions.   if worsening HA, changes vision/speech, imbalance, weakness go to the ER     Concussion, Adult A concussion is a brain injury from a direct hit (blow) to the head or body. This blow causes the brain to shake quickly back and forth inside the skull. This can damage brain cells and cause chemical changes in the brain. A concussion may also be known as a mild traumatic brain injury (TBI). Concussions are usually not life-threatening, but the effects of a concussion can be serious. If you have a concussion, you are more likely to experience concussion-like symptoms after a direct blow to the head in the future. What are the causes? This condition is caused by:  A direct blow to the head, such as from running into another player during a game, being hit in a fight, or hitting your head on a hard surface.  A jolt of the head or neck that causes the brain to move back and forth inside the skull, such as in a car crash.  What are the signs or symptoms? The signs of a concussion can be hard to notice. Early on, they may be missed by you, family members, and health care providers. You may look fine but act or feel differently.  Symptoms are usually temporary, but they may last for days, weeks, or even longer. Some symptoms may appear right away but other symptoms may not show up for hours or days. Every head injury is different. Symptoms may include:  Headaches. This can include a feeling of pressure in the head.  Memory problems.  Trouble concentrating, organizing, or making decisions.  Slowness in thinking, acting or reacting, speaking, or reading.  Confusion.  Fatigue.  Changes in eating or sleeping patterns.  Problems with coordination or balance.  Nausea or vomiting.  Numbness or tingling.  Sensitivity to light  or noise.  Vision or hearing problems.  Reduced sense of smell.  Irritability or mood changes.  Dizziness.  Lack of motivation.  Seeing or hearing things that other people do not see or hear (hallucinations).  How is this diagnosed? This condition is diagnosed based on:  Your symptoms.  A description of your injury.  You may also have tests, including:  Imaging tests, such as a CT scan or MRI. These are done to look for signs of brain injury.  Neuropsychological tests. These measure your thinking, understanding, learning, and remembering abilities.  How is this treated? This condition is treated with physical and mental rest and careful observation, usually at home. If the concussion is severe, you may need to stay home from work for a while. You may be referred to a concussion clinic or to other health care providers for management. It is important that you tell your health care provider if:  You are taking any medicines, including prescription medicines, over-the-counter medicines, and natural remedies. Some medicines, such as blood thinners (anticoagulants) and aspirin, may increase the chance of complications, such as bleeding.  You are taking or have taken alcohol or illegal drugs. Alcohol and certain other drugs may slow your recovery and can put you at risk of further injury.  How fast you will recover from a concussion depends on many factors, such as how severe your concussion is, what part of your brain was injured, how old you are, and how healthy you were before the concussion. Recovery can take time. It is important to wait to return to activity until a health care provider says it is safe to do that and your symptoms are completely gone. Follow these instructions at home: Activity  Limit activities that require a lot of thought or concentration. These may include: ? Doing homework or job-related work. ? Watching TV. ? Working on the computer. ? Playing memory  games and puzzles.  Rest. Rest helps the brain to heal. Make sure you: ? Get plenty of sleep at night. Avoid staying up late at night. ? Keep the same bedtime hours on weekends and weekdays. ? Rest during the day. Take naps or rest breaks when you feel tired.  Having another concussion before the first one has healed can be dangerous. Do not do high-risk activities that could cause a second concussion, such as riding a bicycle or playing sports.  Ask your health care provider when you can return to your normal activities, such as school, work, athletics, driving, riding a bicycle, or using heavy machinery. Your ability to react may be slower after a brain injury. Never do these activities if you are dizzy. Your health care provider will likely give you a plan for gradually returning to activities. General instructions  Take over-the-counter and prescription medicines only as told by your health care provider.  Do not drink alcohol until your health care provider says  you can.  If it is harder than usual to remember things, write them down.  If you are easily distracted, try to do one thing at a time. For example, do not try to watch TV while fixing dinner.  Talk with family members or close friends when making important decisions.  Watch your symptoms and tell others to do the same. Complications sometimes occur after a concussion. Older adults with a brain injury may have a higher risk of serious complications, such as a blood clot in the brain.  Tell your teachers, school nurse, school counselor, coach, athletic trainer, or work Production designer, theatre/television/filmmanager about your injury, symptoms, and restrictions. Tell them about what you can or cannot do. They should watch for: ? Increased problems with attention or concentration. ? Increased difficulty remembering or learning new information. ? Increased time needed to complete tasks or assignments. ? Increased irritability or decreased ability to cope with  stress. ? Increased symptoms.  Keep all follow-up visits as told by your health care provider. This is important. How is this prevented? It is very important to avoid another brain injury, especially as you recover. In rare cases, another injury can lead to permanent brain damage, brain swelling, or death. The risk of this is greatest during the first 7-10 days after a head injury. Avoid injuries by:  Wearing a seat belt when riding in a car.  Wearing a helmet when biking, skiing, skateboarding, skating, or doing similar activities.  Avoiding activities that could lead to a second concussion, such as contact or recreational sports, until your health care provider says it is okay.  Taking safety measures in your home, such as: ? Removing clutter and tripping hazards from floors and stairways. ? Using grab bars in bathrooms and handrails by stairs. ? Placing non-slip mats on floors and in bathtubs. ? Improving lighting in dim areas.  Contact a health care provider if:  Your symptoms get worse.  You have new symptoms.  You continue to have symptoms for more than 2 weeks. Get help right away if:  You have severe or worsening headaches.  You have weakness or numbness in any part of your body.  Your coordination gets worse.  You vomit repeatedly.  You are sleepier.  The pupil of one eye is larger than the other.  You have convulsions or a seizure.  Your speech is slurred.  Your fatigue, confusion, or irritability gets worse.  You cannot recognize people or places.  You have neck pain.  It is difficult to wake you up.  You have unusual behavior changes.  You lose consciousness. Summary  A concussion is a brain injury from a direct hit (blow) to the head or body.  A concussion may also be called a mild traumatic brain injury (TBI).  You may have imaging tests and neuropsychological tests to diagnose a concussion.  This condition is treated with physical and  mental rest and careful observation.  Ask your health care provider when you can return to your normal activities, such as school, work, athletics, driving, riding a bicycle, or using heavy machinery. Follow safety instructions as told by your health care provider. This information is not intended to replace advice given to you by your health care provider. Make sure you discuss any questions you have with your health care provider. Document Released: 05/08/2003 Document Revised: 01/27/2016 Document Reviewed: 01/27/2016 Elsevier Interactive Patient Education  2017 [employer]

## 2016-10-05 LAB — URINALYSIS, ROUTINE W REFLEX MICROSCOPIC
Bilirubin Urine: NEGATIVE
Glucose, UA: NEGATIVE
Hgb urine dipstick: NEGATIVE
Ketones, ur: NEGATIVE
Leukocytes, UA: NEGATIVE
Nitrite: NEGATIVE
Protein, ur: NEGATIVE
Specific Gravity, Urine: 1.019 (ref 1.001–1.035)
pH: 6.5 (ref 5.0–8.0)

## 2016-10-05 LAB — HEMOGLOBIN A1C
Hgb A1c MFr Bld: 5.5 % (ref <5.7)
Mean Plasma Glucose: 111 mg/dL

## 2016-10-05 LAB — MAGNESIUM: Magnesium: 2 mg/dL (ref 1.5–2.5)

## 2016-10-05 LAB — URINE CULTURE: Organism ID, Bacteria: NO GROWTH

## 2016-10-07 ENCOUNTER — Other Ambulatory Visit: Payer: Self-pay

## 2016-10-07 MED ORDER — PANTOPRAZOLE SODIUM 40 MG PO TBEC: DELAYED_RELEASE_TABLET | ORAL | 0 refills | Status: DC

## 2016-11-16 ENCOUNTER — Other Ambulatory Visit: Payer: Self-pay | Admitting: Internal Medicine

## 2016-11-17 ENCOUNTER — Telehealth: Payer: Self-pay | Admitting: *Deleted

## 2016-11-17 ENCOUNTER — Other Ambulatory Visit: Payer: Self-pay | Admitting: Internal Medicine

## 2016-11-17 NOTE — Telephone Encounter (Signed)
Patient went to the [facility] urgent care on 11/16/2016 due to her BP being low at 99/58.  An Ekg was done and the patient sent a copy to our office, which Dr Oneta Rack compared to her last EKG.  The patient has an OV here on 11/18/2016 and per Dr Oneta Rack, she should hold her BP medicatons until then, unless they are above 160/.  Patient is aware.

## 2016-11-18 ENCOUNTER — Ambulatory Visit (INDEPENDENT_AMBULATORY_CARE_PROVIDER_SITE_OTHER): Payer: Medicare Other | Admitting: Internal Medicine

## 2016-11-18 VITALS — BP 138/84 | HR 76 | Temp 97.3°F | Resp 18 | Ht 64.0 in | Wt 147.0 lb

## 2016-11-18 DIAGNOSIS — Z79899 Other long term (current) drug therapy: Secondary | ICD-10-CM

## 2016-11-18 DIAGNOSIS — I951 Orthostatic hypotension: Secondary | ICD-10-CM | POA: Diagnosis not present

## 2016-11-18 DIAGNOSIS — I10 Essential (primary) hypertension: Secondary | ICD-10-CM | POA: Diagnosis not present

## 2016-11-18 NOTE — Patient Instructions (Signed)
Orthostatic Hypotension Orthostatic hypotension is a sudden drop in blood pressure that happens when you quickly change positions, such as when you get up from a seated or lying position. Blood pressure is a measurement of how strongly, or weakly, your blood is pressing against the walls of your arteries. Arteries are blood vessels that carry blood from your heart throughout your body. When blood pressure is too low, you may not get enough blood to your brain or to the rest of your organs. This can cause weakness, light-headedness, rapid heartbeat, and fainting. This can last for just a few seconds or for up to a few minutes. Orthostatic hypotension is usually not a serious problem. However, if it happens frequently or gets worse, it may be a sign of something more serious. What are the causes? This condition may be caused by:  Sudden changes in posture, such as standing up quickly after you have been sitting or lying down.  Blood loss.  Loss of body fluids (dehydration).  Heart problems.  Hormone (endocrine) problems.  Pregnancy.  Severe infection.  Lack of certain nutrients.  Severe allergic reactions (anaphylaxis).  Certain medicines, such as blood pressure medicine or medicines that make the body lose excess fluids (diuretics). Sometimes, this condition can be caused by not taking medicine as directed, such as taking too much of a certain medicine.  What increases the risk? Certain factors can make you more likely to develop orthostatic hypotension, including:  Age. Risk increases as you get older.  Conditions that affect the heart or the central nervous system.  Taking certain medicines, such as blood pressure medicine or diuretics.  Being pregnant.  What are the signs or symptoms? Symptoms of this condition may include:  Weakness.  Light-headedness.  Dizziness.  Blurred vision.  Fatigue.  Rapid heartbeat.  Fainting, in severe cases.  How is this  diagnosed? This condition is diagnosed based on:  Your medical history.  Your symptoms.  Your blood pressure measurement. Your health care provider will check your blood pressure when you are: ? Lying down. ? Sitting. ? Standing.  A blood pressure reading is recorded as two numbers, such as "120 over 80" (or 120/80). The first ("top") number is called the systolic pressure. It is a measure of the pressure in your arteries as your heart beats. The second ("bottom") number is called the diastolic pressure. It is a measure of the pressure in your arteries when your heart relaxes between beats. Blood pressure is measured in a unit called mm Hg. Healthy blood pressure for adults is 120/80. If your blood pressure is below 90/60, you may be diagnosed with hypotension. Other information or tests that may be used to diagnose orthostatic hypotension include:  Your other vital signs, such as your heart rate and temperature.  Blood tests.  Tilt table test. For this test, you will be safely secured to a table that moves you from a lying position to an upright position. Your heart rhythm and blood pressure will be monitored during the test.  How is this treated? Treatment for this condition may include:  Changing your diet. This may involve eating more salt (sodium) or drinking more water.  Taking medicines to raise your blood pressure.  Changing the dosage of certain medicines you are taking that might be lowering your blood pressure.  Wearing compression stockings. These stockings help to prevent blood clots and reduce swelling in your legs.  In some cases, you may need to go to the hospital for:    Fluid replacement. This means you will receive fluids through an IV tube.  Blood replacement. This means you will receive donated blood through an IV tube (transfusion).  Treating an infection or heart problems, if this applies.  Monitoring. You may need to be monitored while medicines that you  are taking wear off.  Follow these instructions at home: Eating and drinking   Drink enough fluid to keep your urine clear or pale yellow.  Eat a healthy diet and follow instructions from your health care provider about eating or drinking restrictions. A healthy diet includes: ? Fresh fruits and vegetables. ? Whole grains. ? Lean meats. ? Low-fat dairy products.  Eat extra salt only as directed. Do not add extra salt to your diet unless your health care provider told you to do that.  Eat frequent, small meals.  Avoid standing up suddenly after eating. Medicines  Take over-the-counter and prescription medicines only as told by your health care provider. ? Follow instructions from your health care provider about changing the dosage of your current medicines, if this applies. ? Do not stop or adjust any of your medicines on your own. General instructions  Wear compression stockings as told by your health care provider.  Get up slowly from lying down or sitting positions. This gives your blood pressure a chance to adjust.  Avoid hot showers and excessive heat as directed by your health care provider.  Return to your normal activities as told by your health care provider. Ask your health care provider what activities are safe for you.  Do not use any products that contain nicotine or tobacco, such as cigarettes and e-cigarettes. If you need help quitting, ask your health care provider.  Keep all follow-up visits as told by your health care provider. This is important. Contact a health care provider if:  You vomit.  You have diarrhea.  You have a fever for more than 2-3 days.  You feel more thirsty than usual.  You feel weak and tired. Get help right away if:  You have chest pain.  You have a fast or irregular heartbeat.  You develop numbness in any part of your body.  You cannot move your arms or your legs.  You have trouble speaking.  You become sweaty or feel  lightheaded.  You faint.  You feel short of breath.  You have trouble staying awake.  You feel confused. This information is not intended to replace advice given to you by your health care provider. Make sure you discuss any questions you have with your health care provider. Document Released: 02/05/2002 Document Revised: 11/04/2015 Document Reviewed: 08/08/2015 Elsevier Interactive Patient Education  2018 [employer] Inc[employer]  

## 2016-11-19 LAB — CBC WITH DIFFERENTIAL/PLATELET
Basophils Absolute: 61 cells/uL (ref 0–200)
Basophils Relative: 1.1 %
Eosinophils Absolute: 198 cells/uL (ref 15–500)
Eosinophils Relative: 3.6 %
HCT: 36.8 % (ref 35.0–45.0)
Hemoglobin: 12.5 g/dL (ref 11.7–15.5)
Lymphs Abs: 1573 cells/uL (ref 850–3900)
MCH: 31.4 pg (ref 27.0–33.0)
MCHC: 34 g/dL (ref 32.0–36.0)
MCV: 92.5 fL (ref 80.0–100.0)
MPV: 9.5 fL (ref 7.5–12.5)
Monocytes Relative: 10.3 %
Neutro Abs: 3102 cells/uL (ref 1500–7800)
Neutrophils Relative %: 56.4 %
Platelets: 193 10*3/uL (ref 140–400)
RBC: 3.98 10*6/uL (ref 3.80–5.10)
RDW: 11.8 % (ref 11.0–15.0)
Total Lymphocyte: 28.6 %
WBC mixed population: 567 cells/uL (ref 200–950)
WBC: 5.5 10*3/uL (ref 3.8–10.8)

## 2016-11-19 LAB — BASIC METABOLIC PANEL WITH GFR
BUN: 19 mg/dL (ref 7–25)
CO2: 27 mmol/L (ref 20–32)
Calcium: 9.4 mg/dL (ref 8.6–10.4)
Chloride: 103 mmol/L (ref 98–110)
Creat: 0.84 mg/dL (ref 0.60–0.93)
GFR, Est African American: 79 mL/min/{1.73_m2} (ref >=60)
GFR, Est Non African American: 68 mL/min/{1.73_m2} (ref >=60)
Glucose, Bld: 98 mg/dL (ref 65–99)
Potassium: 3.8 mmol/L (ref 3.5–5.3)
Sodium: 140 mmol/L (ref 135–146)

## 2016-11-19 LAB — MAGNESIUM: Magnesium: 2.1 mg/dL (ref 1.5–2.5)

## 2016-11-20 ENCOUNTER — Encounter: Payer: Self-pay | Admitting: Internal Medicine

## 2016-11-20 NOTE — Progress Notes (Signed)
Subjective:    Patient ID: Jasmine Reeves, female    DOB: 07/21/1940, 75 y.o.   MRN: [id number]  HPI  This nice 76 yo MWF was evaluated 2 days ago a t an Urgent Care for c/o LBP and was reported tyo have a low BP and she' with held her BP meds since and today's BP off meds is normal w/o postural changes . She denies any HA, dizziness, CP, palpitations, dyspnea, diaphoresis. Patientr has hx/o HTN controlled since 2001 and of note she did have a CABG in 2000. Apparently U/A & C&S done at the UC returned Negative.   Medication Sig  . aspirin 81 MG Take 81 mg by mouth daily.  Marland Kitchen CALCIUM CITRATE PO Take 500 mg by mouth daily.  Marland Kitchen VITAMIN D 2000 UNITS Take 2,000 Units  2 x  daily.  Marland Kitchen DILT-XR 180 MG 24 hr TAKE 1 CAPSULE BY MOUTH DAILY  . VYTORIN 10-20 MG TAKE 1 TABLET BY MOUTH EVERY DAY  . fexofenadine 180 MG  Take 180 mg by mouth daily.  Marland Kitchen FLONASE  nasal spray 2 SPRAYS IN EACH NOSTRIL TWICE DAILY  . VERAMYST  nasal spray Place 2 sprays into the nose daily as needed for rhinitis.  . hctz 25 MG  TAKE 1 TAB  EVERY DAY - holding  . IRON Take 50 mg by mouth daily.  Marland Kitchen labetalol  100 MG  TAKE 1 AND 1/2 TABLETS BY MOUTH TWICE DAILY - holding   . Losartan 50 MG TAKE 1/2  BY EVERY DAY - holding  . Magnesium 400 MG  Take daily.  . Meloxicam  15 MG  Take 1/2 to 1 tablet daily on a full stomach for pain & inflammation  . Multi-Vit-Min Take 1 tablet by mouth daily.  Marland Kitchen NITROSTAT 0.4 MG SL  as needed for chest pain.  . pantoprazole  40 MG TAKE 1 TAB TWICE DAILY   . vitamin C  500 MG  Take 500 mg by mouth daily.  Marland Kitchen VYTORIN 10-20 MG TAKE 1 TABLET BY MOUTH EVERY DAY    Allergies  Allergen Reactions  . Altace [Ramipril]     cough  . Azithromycin     palpitation  . Celebrex [Celecoxib]     Gi upset   . Hydrochlorothiazide     cramping  . 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 Prilosec [Omeprazole]     constipation   Past Medical History:  Diagnosis Date  . COPD (chronic  obstructive pulmonary disease) (HCC)   . Fibromyalgia   . GERD (gastroesophageal reflux disease)   . IBS (irritable bowel syndrome)   . Scoliosis    Review of Systems  10 point systems review negative except as above.    Objective:   Physical Exam  BP 138/84   Pulse 76   Temp (!) 97.3 F (36.3 C)   Resp 18   Ht  (1.626 m)   Wt 147 lb (66.7 kg)   BMI 25.23 kg/m   Sitting BP 184/84 and standing BP 136,82 w/pulse 76.   HEENT - WNL. Neck - supple.  Chest - Clear equal BS. Cor - Nl HS. RRR w/o sig MGR. PP 1(+). No edema. MS- FROM w/o deformities.  Gait Nl. Neuro -  Nl w/o focal abnormalities.    Assessment & Plan:   1. Essential hypertension  - discussed restarting Labetalol 100 mg x 1/2 tab bid and Losartan 50 mg x 1/4  tab and continue bib monitoring of BP's. Also , advised only taking her HCTZ prn ankle swelling  - CBC with Differential/Platelet - BASIC METABOLIC PANEL WITH GFR - Magnesium  2. Postural hypotension  - CBC with Differential/Platelet - BASIC METABOLIC PANEL WITH GFR  3. Medication management  - CBC with Differential/Platelet - BASIC METABOLIC PANEL WITH GFR - Magnesium

## 2016-11-22 ENCOUNTER — Telehealth: Payer: Self-pay | Admitting: *Deleted

## 2016-11-22 ENCOUNTER — Other Ambulatory Visit: Payer: Self-pay | Admitting: Internal Medicine

## 2016-11-22 NOTE — Telephone Encounter (Signed)
Patient called and reported her BP has been running higher at 140's to 160/70's.  Per Dr Oneta Rack, take Losartan 50 mg 1/2 tablet daily and restart the Labetalol 100 mg 1 tablet twice a day.  Patient is aware and is seeing Dr Tresa Endo on 11/26/2016.

## 2016-11-26 ENCOUNTER — Ambulatory Visit (INDEPENDENT_AMBULATORY_CARE_PROVIDER_SITE_OTHER): Payer: Medicare Other | Admitting: Cardiovascular Disease

## 2016-11-26 ENCOUNTER — Encounter: Payer: Self-pay | Admitting: Cardiovascular Disease

## 2016-11-26 VITALS — BP 131/67 | HR 62 | Ht 65.0 in | Wt 146.0 lb

## 2016-11-26 DIAGNOSIS — G4733 Obstructive sleep apnea (adult) (pediatric): Secondary | ICD-10-CM

## 2016-11-26 DIAGNOSIS — I10 Essential (primary) hypertension: Secondary | ICD-10-CM

## 2016-11-26 DIAGNOSIS — I7300 Raynaud's syndrome without gangrene: Secondary | ICD-10-CM

## 2016-11-26 DIAGNOSIS — I341 Nonrheumatic mitral (valve) prolapse: Secondary | ICD-10-CM

## 2016-11-26 DIAGNOSIS — I2510 Atherosclerotic heart disease of native coronary artery without angina pectoris: Secondary | ICD-10-CM

## 2016-11-26 DIAGNOSIS — Z951 Presence of aortocoronary bypass graft: Secondary | ICD-10-CM | POA: Diagnosis not present

## 2016-11-26 DIAGNOSIS — K219 Gastro-esophageal reflux disease without esophagitis: Secondary | ICD-10-CM

## 2016-11-26 NOTE — Progress Notes (Signed)
Patient ID: ANAHI BELMAR, female   DOB: September 17, 1940, 76 y.o.   MRN: [id number]     HPI: MARDENE LESSIG is a 76 y.o. female presents for 33 month cardiology evaluation.  Ms. Rau underwent CABG revascularization surgery x4 on 03/02/2000 by Dr. Roxy Manns with a LIMA placed her distal LAD, vein to the second diagonal, sequential vein to 2 marginal vessels arising from the circumflex coronary artery. Her RCA was free of disease and was not bypassed. Additional problems include mitral valve prolapse, Raynaud's disease, history of short bursts of nonsustained tachycardia on treadmill testing with reference to this on beta blocker therapy.,. She did develop edema remotely on amlodipine. She hasa history of documented APCs on Holter imaging which have improved with labetalol.  She underwent a nuclear perfusion study on 09/07/2013.  She was noted to have ST segment changes  on ECG but nuclear imaging showed apical thinning with breast attenuation, without ischemia.  She had normal wall motion.  She denies any awareness of palpitations.  She does have issues with some arthritic symptoms.  She takes diltiazem 180 mg in addition to losartan 25 mg, Normodyne 150 mg twice a day and HCTZ 25 mg.  She is on Vytorin 10/20 for hyperlipidemia.  She tells me she was referred for sleep study which was done in [place] after a home study was abnormal.  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 Epworth sleepiness scale score was increased at 16.  CPAP was recommended but this has not yet been initiated.  The patient was not aware that I also am involved in sleep medicine. I reviewed with her current Medicare guidelines.  Lab work by her primary physician in 2016 revealed fasting glucose of 110.  She had normal renal function.  LFTs were normal.  Lipid studies were excellent with a total cholesterol 145, triglycerides 57,  HDL 62, and LDL 72.  Hemoglobin A1c was minimally increased at 5.8.  TSH was 3.029.  Since I last saw her one year ago she has been without chest pain.  She denies any recurrent palpitations.  Apparently last week during a very hot day, she developed a presyncopal spell.  Her blood pressure was low.  Her losartan and labetalol were reduced.  She has been on labetalol at just 100 mg at bedtime, losartan 25 mg, Cardizem long-acting 180 mg and she is no longer taking hydrochlorothiazide.  He continues to be on Vytorin 10/20 for hyperlipidemia.  She is on enterprise all for GERD.  She states that she has not been consistent with her CPAP.  Apparently, she had never had any follow-up from her sleep study which was done in [place].  This originally had been scheduled by her primary care provider.  She is not seen and otherwise sleep medicine physician, even after CPAP initiation.  [facility] is her DME.  She presents for evaluation.  Past Medical History:  Diagnosis Date  . COPD (chronic obstructive pulmonary disease) ([place])   . Fibromyalgia   . GERD (gastroesophageal reflux disease)   . IBS (irritable bowel syndrome)   . Scoliosis     Past Surgical History:  Procedure Laterality Date  . ABDOMINAL HYSTERECTOMY    . APPENDECTOMY    . CORONARY ANGIOPLASTY  12/11  . CORONARY ARTERY BYPASS GRAFT    . ESOPHAGOGASTRODUODENOSCOPY  07/21/12   polyps, Hiatal hernia Dr. Cristina Gong  . TONSILLECTOMY    . TUBAL LIGATION  Allergies  Allergen Reactions  . Altace [Ramipril]     cough  . Azithromycin     palpitation  . Celebrex [Celecoxib]     Gi upset   . Hydrochlorothiazide     cramping  . 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 Prilosec [Omeprazole]     constipation    Current Outpatient Prescriptions  Medication Sig Dispense Refill  . aspirin 81 MG tablet Take 81 mg by mouth daily.    Marland Kitchen CALCIUM CITRATE PO Take 500 mg by mouth daily.    . Cholecalciferol  (VITAMIN D) 2000 UNITS tablet Take 2,000 Units by mouth 2 (two) times daily.    Marland Kitchen DILT-XR 180 MG 24 hr capsule TAKE 1 CAPSULE BY MOUTH DAILY 90 capsule 3  . ezetimibe-simvastatin (VYTORIN) 10-20 MG tablet TAKE 1 TABLET BY MOUTH EVERY DAY 90 tablet 0  . fexofenadine (ALLEGRA) 180 MG tablet Take 180 mg by mouth daily.    . fluticasone (FLONASE) 50 MCG/ACT nasal spray INSTILL 2 SPRAYS IN EACH NOSTRIL TWICE DAILY 48 g 3  . fluticasone (VERAMYST) 27.5 MCG/SPRAY nasal spray Place 2 sprays into the nose daily as needed for rhinitis. 10 g 8  . IRON PO Take 50 mg by mouth daily.    Marland Kitchen labetalol (NORMODYNE) 100 MG tablet Take 100 mg by mouth daily.    Marland Kitchen losartan (COZAAR) 25 MG tablet Take 25 mg by mouth daily.    . Magnesium 400 MG CAPS Take by mouth daily.    . meloxicam (MOBIC) 15 MG tablet Take 1/2 to 1 tablet daily on a full stomach for pain & inflammation 90 tablet 1  . Multiple Vitamins-Minerals (MULTIVITAMIN WITH MINERALS) tablet Take 1 tablet by mouth daily.    . nitroGLYCERIN (NITROSTAT) 0.4 MG SL tablet Place 1 tablet (0.4 mg total) under the tongue every 5 (five) minutes as needed for chest pain. 25 tablet 12  . pantoprazole (PROTONIX) 40 MG tablet TAKE 1 TABLET BY MOUTH TWICE DAILY 30 MINUTES BEFORE BREAKFAST AND DINNER 180 tablet 0  . vitamin C (ASCORBIC ACID) 500 MG tablet Take 500 mg by mouth daily.    Marland Kitchen VYTORIN 10-20 MG tablet TAKE 1 TABLET BY MOUTH EVERY DAY 90 tablet 3  . hydrochlorothiazide (HYDRODIURIL) 25 MG tablet TAKE 1 TABLET BY MOUTH EVERY DAY FOR FLUID RETENTION (Patient not taking: Reported on 11/26/2016) 90 tablet 3   No current facility-administered medications for this visit.     Social History   Social History  . Marital status: Married    Spouse name: N/A  . Number of children: N/A  . Years of education: N/A   Occupational History  . Not on file.   Social History Main Topics  . Smoking status: Former Smoker    Quit date: 09/03/1965  . Smokeless tobacco: Never Used   . Alcohol use 1.8 oz/week    3 Standard drinks or equivalent per week     Comment: rare  . Drug use: No  . Sexual activity: Not on file   Other Topics Concern  . Not on file   Social History Narrative  . No narrative on file   Socially she is married has 2 children 3 grandchildren. She does not routinely exercise. There is no tobacco use. She does drink occasional alcohol. She is retired Systems analyst.  Family History  Problem Relation Age of Onset  . Heart disease Brother   . Hyperlipidemia Brother  ROS General: Negative; No fevers, chills, or night sweats;  HEENT: Negative; No changes in vision or hearing, sinus congestion, difficulty swallowing Pulmonary: Negative; No cough, wheezing, shortness of breath, hemoptysis Cardiovascular: Negative; No chest pain, presyncope, syncope, palpatations GI: Negative; No nausea, vomiting, diarrhea, or abdominal pain GU: Negative; No dysuria, hematuria, or difficulty voiding Musculoskeletal: Positive for a Raynaud's; no myalgias, joint pain, or weakness Hematologic/Oncology: Negative; no easy bruising, bleeding Endocrine: Negative; no heat/cold intolerance; no diabetes Neuro: Negative; no changes in balance, headaches Skin: Negative; No rashes or skin lesions Psychiatric: Negative; No behavioral problems, depression Sleep: Positive for obstructive sleep apnea.  Has CPAP but never had follow-up with a sleep provider Other comprehensive 14 point system review is negative.  PE BP 131/67   Pulse 62   Ht _0  (1.651 m)   Wt 146 lb (66.2 kg)   BMI 24.30 kg/m    Repeat blood pressure by me was 120/70.  Wt Readings from Last 3 Encounters:  11/26/16 146 lb (66.2 kg)  11/18/16 147 lb (66.7 kg)  10/04/16 145 lb 6.4 oz (66 kg)   General: Alert, oriented, no distress.  Skin: normal turgor, no rashes, warm and dry HEENT: Normocephalic, atraumatic. Pupils equal round and reactive to light; sclera anicteric; extraocular  muscles intact;  Nose without nasal septal hypertrophy Mouth/Parynx benign; Mallinpatti scale 2 Neck: No JVD, no carotid bruits; normal carotid upstroke Lungs: clear to ausculatation and percussion; no wheezing or rales Chest wall: without tenderness to palpitation Heart: PMI not displaced, RRR, s1 s2 normal, 1/6 systolic murmur, soft systolic click intermittently, no diastolic murmur, no rubs, gallops, thrills, or heaves Abdomen: soft, nontender; no hepatosplenomehaly, BS+; abdominal aorta nontender and not dilated by palpation. Back: no CVA tenderness Pulses 2+ Musculoskeletal: full range of motion, normal strength, no joint deformities Extremities: no clubbing cyanosis or edema, Homan's sign negative  Neurologic: grossly nonfocal; Cranial nerves grossly wnl Psychologic: Normal mood and affect   ECG (independently read by me): Normal sinus rhythm at 62 bpm.  Nonspecific T-wave abnormality  September 2017 ECG (independently read by me): Sinus bradycardia 56 bpm with mild sinus arrhythmia.  Nonspecific T changes.  September 2016 ECG (independently read by me): Sinus bradycardia with sinus arrhythmia.  No significant ST-T changes.  August 2015 ECG: Sinus rhythm at 51 beats per minute. Non-specific T changes.  LABS: BMP Latest Ref Rng & Units 11/18/2016 10/04/2016 05/19/2016  Glucose 65 - 99 mg/dL 98 88 107(H)  BUN 7 - 25 mg/dL _1 Creatinine 0.60 - 0.93 mg/dL 0.84 0.70 0.88  BUN/Creat Ratio 6 - 22 (calc) NOT APPLICABLE - -  Sodium 782 - 146 mmol/L 140 139 144  Potassium 3.5 - 5.3 mmol/L 3.8 3.6 3.7  Chloride 98 - 110 mmol/L 103 104 106  CO2 20 - 32 mmol/L _2 Calcium 8.6 - 10.4 mg/dL 9.4 9.5 9.3   Hepatic Function Latest Ref Rng & Units 10/04/2016 05/19/2016 03/15/2016  Total Protein 6.1 - 8.1 g/dL 6.5 6.0(L) 6.2  Albumin 3.6 - 5.1 g/dL 4.6 4.2 4.2  AST 10 - 35 U/L _3 ALT 6 - 29 U/L _4 Alk Phosphatase 33 - 130 U/L 60 51 54  Total Bilirubin 0.2 - 1.2 mg/dL  0.6 0.4 0.5  Bilirubin, Direct <=0.2 mg/dL 0.1 0.1 0.1   CBC Latest Ref Rng & Units 11/18/2016 10/04/2016 05/19/2016  WBC 3.8 - 10.8 Thousand/uL 5.5 5.0 6.0  Hemoglobin 11.7 - 15.5 g/dL  12.5 12.7 13.0  Hematocrit 35.0 - 45.0 % 36.8 37.9 38.6  Platelets 140 - 400 Thousand/uL 193 206 182   Lab Results  Component Value Date   MCV 92.5 11/18/2016   MCV 92.9 10/04/2016   MCV 92.6 05/19/2016   Lab Results  Component Value Date   TSH 3.96 10/04/2016   Lab Results  Component Value Date   HGBA1C 5.5 10/04/2016   Lipid Panel     Component Value Date/Time   CHOL 153 10/04/2016 1114   TRIG 108 10/04/2016 1114   HDL 62 10/04/2016 1114   CHOLHDL 2.5 10/04/2016 1114   VLDL 22 10/04/2016 1114   LDLCALC 69 10/04/2016 1114    RADIOLOGY: No results found.  IMPRESSION:  No diagnosis found.  ASSESSMENT AND PLAN: Ms. Caliyah Sieh is a 76 years old Caucasian female who underwent cardiac catheterization on New Year's Eve 2001.  Catheterization revealed severe two-vessel coronary artery disease and she underwent CABG revascularization surgery on 03/02/2000. Her last catheterization was in 2008 which showed patent grafts. She has normal LV function and mitral valve prolapse. She is not having ectopy on low-dose beta blocker therapy.  Her last  nuclear perfusion study in 2015 was unchanged and continued to show normal perfusion without scar or ischemia.  She did have exercise-induced ST changes.  He also has a history of Ranade's.  Her blood pressure today is stable on Cardizem 180 mg, losartan, which is now at a reduced dose of 25 mg daily.  Beta wall 100 mg.  Apparently, she had a presyncopal spell last week which resulted in reduction in her medications.  She denies any further episodes of lightheadedness.  Her GERD is controlled with pantoprazole.  She continues to be on Vytorin 10/20 for hyperlipidemia.  I reviewed recent laboratory from one month ago.  LDL was 69.  She had been referred by her  primary physician for outpatient home sleep study.  She never saw a sleep physician after she was diagnosed with sleep apnea and CPAP was implemented.  I have said I would be more than willing to follow her for her sleep apnea. I requested that she contact [facility] and obtain a download after she starts using CPAP again consistently.  I can then make adjustments if necessary to her regimen and will follow her for her compliance and effectiveness of treatment.  I will see her in 6 months for reevaluation.  Time spent: 25 minutes Troy Sine, MD, [facility]  11/26/2016 4:57 PM

## 2016-11-26 NOTE — Patient Instructions (Signed)

## 2016-11-27 LAB — GLUCOSE, POCT (MANUAL RESULT ENTRY): POC Glucose: 126 mg/dl — AB (ref 70–99)

## 2016-12-08 ENCOUNTER — Ambulatory Visit: Payer: Medicare Other | Admitting: Cardiovascular Disease

## 2016-12-13 ENCOUNTER — Other Ambulatory Visit: Payer: Self-pay | Admitting: Physician Assistant

## 2017-01-07 ENCOUNTER — Ambulatory Visit: Payer: Medicare Other | Admitting: Internal Medicine

## 2017-01-07 VITALS — BP 122/74 | HR 76 | Temp 97.7°F | Resp 18 | Ht 65.0 in | Wt 146.0 lb

## 2017-01-07 DIAGNOSIS — Z9989 Dependence on other enabling machines and devices: Secondary | ICD-10-CM

## 2017-01-07 DIAGNOSIS — I10 Essential (primary) hypertension: Secondary | ICD-10-CM

## 2017-01-07 DIAGNOSIS — E782 Mixed hyperlipidemia: Secondary | ICD-10-CM

## 2017-01-07 DIAGNOSIS — I25118 Atherosclerotic heart disease of native coronary artery with other forms of angina pectoris: Secondary | ICD-10-CM

## 2017-01-07 DIAGNOSIS — G4733 Obstructive sleep apnea (adult) (pediatric): Secondary | ICD-10-CM

## 2017-01-07 DIAGNOSIS — R7303 Prediabetes: Secondary | ICD-10-CM

## 2017-01-07 DIAGNOSIS — Z79899 Other long term (current) drug therapy: Secondary | ICD-10-CM

## 2017-01-07 DIAGNOSIS — E559 Vitamin D deficiency, unspecified: Secondary | ICD-10-CM | POA: Diagnosis not present

## 2017-01-07 NOTE — Progress Notes (Signed)
This very nice 76 y.o. MWF presents for 6 month follow up with Hypertension, ASCAD Hyperlipidemia, Pre-Diabetes and Vitamin D Deficiency. Patient also has OSA/CPAP with improved restorative sleep. Her GERD is controlled w/her meds.     Patient is treated for HTN (1998) & BP has been controlled at home. Today's BP is at goal - 122/74. In Nov & Dec 2001, she had PCA x 2 and in  Jan 2002 , she underwent CABG and last Myocardial scan (2015) was negative.  Patient has had no complaints of any cardiac type chest pain, palpitations, dyspnea / orthopnea / PND, dizziness, claudication, or dependent edema.       Hyperlipidemia is controlled with diet & meds. Patient denies myalgias or other med SE's. Last Lipids were at goal:  Lab Results  Component Value Date   CHOL 153 10/04/2016   HDL 62 10/04/2016   LDLCALC 69 10/04/2016   TRIG 108 10/04/2016   CHOLHDL 2.5 10/04/2016      Also, the patient has history of PreDiabetes (A1c 6.3% in 2012) and has had no symptoms of reactive hypoglycemia, diabetic polys, paresthesias or visual blurring.  Last A1c was at goal:  Lab Results  Component Value Date   HGBA1C 5.5 10/04/2016      Further, the patient also has history of Vitamin D Deficiency and supplements vitamin D without any suspected side-effects. Last vitamin D was at goal:  Lab Results  Component Value Date   VD25OH 87 06/25/2016   Current Outpatient Medications on File Prior to Visit  Medication Sig  . aspirin 81 MG tablet Take 81 mg by mouth daily.  Marland Kitchen. CALCIUM CITRATE PO Take 500 mg by mouth daily.  . Cholecalciferol (VITAMIN D) 2000 UNITS tablet Take 2,000 Units by mouth 2 (two) times daily.  Marland Kitchen. DILT-XR 180 MG 24 hr capsule TAKE 1 CAPSULE BY MOUTH DAILY  . ezetimibe-simvastatin (VYTORIN) 10-20 MG tablet TAKE 1 TABLET BY MOUTH EVERY DAY  . fexofenadine (ALLEGRA) 180 MG tablet Take 180 mg by mouth daily.  . fluticasone (FLONASE) 50 MCG/ACT nasal spray INSTILL 2 SPRAYS IN EACH NOSTRIL TWICE  DAILY  . fluticasone (VERAMYST) 27.5 MCG/SPRAY nasal spray Place 2 sprays into the nose daily as needed for rhinitis.  . hydrochlorothiazide (HYDRODIURIL) 25 MG tablet TAKE 1 TABLET BY MOUTH EVERY DAY FOR FLUID RETENTION  . IRON PO Take 50 mg by mouth daily.  Marland Kitchen. labetalol (NORMODYNE) 100 MG tablet TAKE 1 AND 1/2 TABLETS BY MOUTH TWICE DAILY  . losartan (COZAAR) 25 MG tablet Take 25 mg by mouth daily.  . Magnesium 400 MG CAPS Take by mouth daily.  . meloxicam (MOBIC) 15 MG tablet Take 1/2 to 1 tablet daily on a full stomach for pain & inflammation  . Multiple Vitamins-Minerals (MULTIVITAMIN WITH MINERALS) tablet Take 1 tablet by mouth daily.  . nitroGLYCERIN (NITROSTAT) 0.4 MG SL tablet Place 1 tablet (0.4 mg total) under the tongue every 5 (five) minutes as needed for chest pain.  . pantoprazole (PROTONIX) 40 MG tablet TAKE 1 TABLET BY MOUTH TWICE DAILY 30 MINUTES BEFORE BREAKFAST AND DINNER  . vitamin C (ASCORBIC ACID) 500 MG tablet Take 500 mg by mouth daily.  Marland Kitchen. VYTORIN 10-20 MG tablet TAKE 1 TABLET BY MOUTH EVERY DAY   No current facility-administered medications on file prior to visit.    Allergies  Allergen Reactions  . Altace [Ramipril]     cough  . Azithromycin     palpitation  .  Celebrex [Celecoxib]     Gi upset   . Hydrochlorothiazide     cramping  . 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 Prilosec [Omeprazole]     constipation   PMHx:   Past Medical History:  Diagnosis Date  . COPD (chronic obstructive pulmonary disease) (HCC)   . Fibromyalgia   . GERD (gastroesophageal reflux disease)   . IBS (irritable bowel syndrome)   . Scoliosis    Immunization History  Administered Date(s) Administered  . Influenza, High Dose Seasonal PF 11/11/2014, 12/08/2015  . Influenza,inj,Quad PF,6+ Mos 11/27/2016  . Pneumococcal Conjugate-13 04/16/2014  . Pneumococcal-Unspecified 03/01/1998, 03/12/2010  . Td 03/27/2012  . Zoster 03/02/2007   Past  Surgical History:  Procedure Laterality Date  . ABDOMINAL HYSTERECTOMY    . APPENDECTOMY    . CORONARY ANGIOPLASTY  12/11  . CORONARY ARTERY BYPASS GRAFT    . ESOPHAGOGASTRODUODENOSCOPY  07/21/12   polyps, Hiatal hernia Dr. Matthias HughsBuccini  . TONSILLECTOMY    . TUBAL LIGATION     FHx:    Reviewed / unchanged  SHx:    Reviewed / unchanged  Systems Review:  Constitutional: Denies fever, chills, wt changes, headaches, insomnia, fatigue, night sweats, change in appetite. Eyes: Denies redness, blurred vision, diplopia, discharge, itchy, watery eyes.  ENT: Denies discharge, congestion, post nasal drip, epistaxis, sore throat, earache, hearing loss, dental pain, tinnitus, vertigo, sinus pain, snoring.  CV: Denies chest pain, palpitations, irregular heartbeat, syncope, dyspnea, diaphoresis, orthopnea, PND, claudication or edema. Respiratory: denies cough, dyspnea, DOE, pleurisy, hoarseness, laryngitis, wheezing.  Gastrointestinal: Denies dysphagia, odynophagia, heartburn, reflux, water brash, abdominal pain or cramps, nausea, vomiting, bloating, diarrhea, constipation, hematemesis, melena, hematochezia  or hemorrhoids. Genitourinary: Denies dysuria, frequency, urgency, nocturia, hesitancy, discharge, hematuria or flank pain. Musculoskeletal: Denies arthralgias, myalgias, stiffness, jt. swelling, pain, limping or strain/sprain.  Skin: Denies pruritus, rash, hives, warts, acne, eczema or change in skin lesion(s). Neuro: No weakness, tremor, incoordination, spasms, paresthesia or pain. Psychiatric: Denies confusion, memory loss or sensory loss. Endo: Denies change in weight, skin or hair change.  Heme/Lymph: No excessive bleeding, bruising or enlarged lymph nodes.  Physical Exam  BP 122/74   Pulse 76   Temp 97.7 F (36.5 C)   Resp 18   Ht 5\' 5"  (1.651 m)   Wt 146 lb (66.2 kg)   BMI 24.30 kg/m   Appears well nourished, well groomed  and in no distress.  Eyes: PERRLA, EOMs, conjunctiva no  swelling or erythema. Sinuses: No frontal/maxillary tenderness ENT/Mouth: EAC's clear, TM's nl w/o erythema, bulging. Nares clear w/o erythema, swelling, exudates. Oropharynx clear without erythema or exudates. Oral hygiene is good. Tongue normal, non obstructing. Hearing intact.  Neck: Supple. Thyroid nl. Car 2+/2+ without bruits, nodes or JVD. Chest: Respirations nl with BS clear & equal w/o rales, rhonchi, wheezing or stridor.  Cor: Heart sounds normal w/ regular rate and rhythm without sig. murmurs, gallops, clicks or rubs. Peripheral pulses normal and equal  without edema.  Abdomen: Soft & bowel sounds normal. Non-tender w/o guarding, rebound, hernias, masses or organomegaly.  Lymphatics: Unremarkable.  Musculoskeletal: Full ROM all peripheral extremities, joint stability, 5/5 strength and normal gait.  Skin: Warm, dry without exposed rashes, lesions or ecchymosis apparent.  Neuro: Cranial nerves intact, reflexes equal bilaterally. Sensory-motor testing grossly intact. Tendon reflexes grossly intact.  Pysch: Alert & oriented x 3.  Insight and judgement nl & appropriate. No ideations.  Assessment and Plan:  1. Essential hypertension  -  Continue medication, monitor blood pressure at home.  - Continue DASH diet. Reminder to go to the ER if any CP,  SOB, nausea, dizziness, severe HA, changes vision/speech.  - CBC with Differential/Platelet - BASIC METABOLIC PANEL WITH GFR - Magnesium - TSH  2. Hyperlipidemia, mixed  - Continue diet/meds, exercise,& lifestyle modifications.  - Continue monitor periodic cholesterol/liver & renal functions   - Hepatic function panel - Lipid panel - TSH  3. Prediabetes  - Continue diet, exercise, lifestyle modifications.  - Monitor appropriate labs.  - Hemoglobin A1c - Insulin, random  4. Vitamin D deficiency  - Continue supplementation.  - VITAMIN D 25 Hydroxy   5. 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 OSA on CPAP   7. Medication management  - CBC with Differential/Platelet - BASIC METABOLIC PANEL WITH GFR - Hepatic function panel - Magnesium - Lipid panel - TSH - Hemoglobin A1c - Insulin, random - VITAMIN D 25 Hydroxy        Discussed  regular exercise, BP monitoring, weight control to achieve/maintain BMI less than 25 and discussed med and SE's. Recommended labs to assess and monitor clinical status with further disposition pending results of labs. Over 30 minutes of exam, counseling, chart review was performed.

## 2017-01-07 NOTE — Patient Instructions (Signed)

## 2017-01-08 ENCOUNTER — Encounter: Payer: Self-pay | Admitting: Internal Medicine

## 2017-01-10 LAB — CBC WITH DIFFERENTIAL/PLATELET
Basophils Absolute: 32 cells/uL (ref 0–200)
Basophils Relative: 0.6 %
Eosinophils Absolute: 92 cells/uL (ref 15–500)
Eosinophils Relative: 1.7 %
HCT: 38.5 % (ref 35.0–45.0)
Hemoglobin: 13.3 g/dL (ref 11.7–15.5)
Lymphs Abs: 1064 cells/uL (ref 850–3900)
MCH: 31.3 pg (ref 27.0–33.0)
MCHC: 34.5 g/dL (ref 32.0–36.0)
MCV: 90.6 fL (ref 80.0–100.0)
MPV: 9.7 fL (ref 7.5–12.5)
Monocytes Relative: 8 %
Neutro Abs: 3780 cells/uL (ref 1500–7800)
Neutrophils Relative %: 70 %
Platelets: 200 10*3/uL (ref 140–400)
RBC: 4.25 10*6/uL (ref 3.80–5.10)
RDW: 11.7 % (ref 11.0–15.0)
Total Lymphocyte: 19.7 %
WBC mixed population: 432 cells/uL (ref 200–950)
WBC: 5.4 10*3/uL (ref 3.8–10.8)

## 2017-01-10 LAB — LIPID PANEL
Cholesterol: 158 mg/dL (ref <200)
HDL: 72 mg/dL (ref >50)
LDL Cholesterol (Calc): 69 mg/dL (calc)
Non-HDL Cholesterol (Calc): 86 mg/dL (calc) (ref <130)
Total CHOL/HDL Ratio: 2.2 (calc) (ref <5.0)
Triglycerides: 85 mg/dL (ref <150)

## 2017-01-10 LAB — BASIC METABOLIC PANEL WITH GFR
BUN: 20 mg/dL (ref 7–25)
CO2: 27 mmol/L (ref 20–32)
Calcium: 9.4 mg/dL (ref 8.6–10.4)
Chloride: 104 mmol/L (ref 98–110)
Creat: 0.85 mg/dL (ref 0.60–0.93)
GFR, Est African American: 78 mL/min/{1.73_m2} (ref >=60)
GFR, Est Non African American: 67 mL/min/{1.73_m2} (ref >=60)
Glucose, Bld: 99 mg/dL (ref 65–99)
Potassium: 3.9 mmol/L (ref 3.5–5.3)
Sodium: 140 mmol/L (ref 135–146)

## 2017-01-10 LAB — HEPATIC FUNCTION PANEL
AG Ratio: 2.1 (calc) (ref 1.0–2.5)
ALT: 23 U/L (ref 6–29)
AST: 22 U/L (ref 10–35)
Albumin: 4.5 g/dL (ref 3.6–5.1)
Alkaline phosphatase (APISO): 60 U/L (ref 33–130)
Bilirubin, Direct: 0.1 mg/dL (ref 0.0–0.2)
Globulin: 2.1 g/dL (calc) (ref 1.9–3.7)
Indirect Bilirubin: 0.5 mg/dL (calc) (ref 0.2–1.2)
Total Bilirubin: 0.6 mg/dL (ref 0.2–1.2)
Total Protein: 6.6 g/dL (ref 6.1–8.1)

## 2017-01-10 LAB — VITAMIN D 25 HYDROXY (VIT D DEFICIENCY, FRACTURES): Vit D, 25-Hydroxy: 59 ng/mL (ref 30–100)

## 2017-01-10 LAB — HEMOGLOBIN A1C
Hgb A1c MFr Bld: 5.5 % of total Hgb (ref <5.7)
Mean Plasma Glucose: 111 (calc)
eAG (mmol/L): 6.2 (calc)

## 2017-01-10 LAB — TSH: TSH: 3 mIU/L (ref 0.40–4.50)

## 2017-01-10 LAB — MAGNESIUM: Magnesium: 2.2 mg/dL (ref 1.5–2.5)

## 2017-01-10 LAB — INSULIN, RANDOM: Insulin: 7.8 u[IU]/mL (ref 2.0–19.6)

## 2017-02-23 ENCOUNTER — Other Ambulatory Visit: Payer: Self-pay

## 2017-02-23 NOTE — Patient Outreach (Signed)
[facility] Care Management  02/23/2017  [name] 01-14-1941 [id number]   Medication Adherence call to Mrs. Quin HoopWilma Reeves patient is showing past due under [facility]on Losartan 50 mg spoke with patient she said she is only taking a 1/2 tablet not a full tablet per doctors instruction.she still has medication she does not need any at this time.  Lillia AbedAna Ollison-Moran CPhT Pharmacy Technician [facility] Direct Dial [phone]  Fax [phone] [email or web address]

## 2017-02-24 ENCOUNTER — Encounter: Payer: Self-pay | Admitting: Adult Health

## 2017-02-24 ENCOUNTER — Ambulatory Visit: Payer: Medicare Other | Admitting: Adult Health

## 2017-02-24 VITALS — BP 126/76 | HR 70 | Temp 99.1°F | Ht 65.0 in | Wt 143.0 lb

## 2017-02-24 DIAGNOSIS — K219 Gastro-esophageal reflux disease without esophagitis: Secondary | ICD-10-CM | POA: Diagnosis not present

## 2017-02-24 DIAGNOSIS — R05 Cough: Secondary | ICD-10-CM | POA: Diagnosis not present

## 2017-02-24 DIAGNOSIS — J209 Acute bronchitis, unspecified: Secondary | ICD-10-CM | POA: Diagnosis not present

## 2017-02-24 DIAGNOSIS — R059 Cough, unspecified: Secondary | ICD-10-CM

## 2017-02-24 MED ORDER — PROMETHAZINE-DM 6.25-15 MG/5ML PO SYRP: 5.0000 mL | ORAL_SOLUTION | Freq: Four times a day (QID) | ORAL | 1 refills | Status: DC | PRN

## 2017-02-24 MED ORDER — RANITIDINE HCL 150 MG PO TABS: 150.0000 mg | ORAL_TABLET | Freq: Two times a day (BID) | ORAL | 1 refills | Status: DC

## 2017-02-24 MED ORDER — PREDNISONE 20 MG PO TABS: ORAL_TABLET | ORAL | 0 refills | Status: DC

## 2017-02-24 MED ORDER — CEPHALEXIN 500 MG PO CAPS: 500.0000 mg | ORAL_CAPSULE | Freq: Two times a day (BID) | ORAL | 0 refills | Status: AC

## 2017-02-24 NOTE — Patient Instructions (Signed)
GETTING OFF OF PPI's    Nexium/protonix/prilosec/Omeprazole/Dexilant/Aciphex are called PPI's, they are great at healing your stomach but should only be taken for a short period of time.     Recent studies have shown that taken for a long time they  can increase the risk of osteoporosis (weakening of your bones), pneumonia, low magnesium, restless legs, Cdiff (infection that causes diarrhea), DEMENTIA and most recently kidney damage / disease / insufficiency.     Due to this information we want to try to stop the PPI but if you try to stop it abruptly this can cause rebound acid and worsening symptoms.   So this is how we want you to get off the PPI: Generic is always fine!!  - Start taking the nexium/protonix/prilosec/PPI  every other day with  zantac (ranitidine) OR pepcid famotadine 2 x a day for 2-4 weeks - some people stay on this dosage and can not taper off further. Our main goal is to limit the dosage and amount you are taking so if you need to stay on this dose.   - then decrease the PPI to every 3 days while taking the zantac or pepcid 300mg  twice a day the other  days for 2-4  Weeks  - then you can try the zantac or pepcid 300mg  once at night or up to 2 x day as needed.  - you can continue on this once at night or stop all together  - Avoid alcohol, spicy foods, NSAIDS (aleve, ibuprofen) at this time. See foods below.   +++++++++++++++++++++++++++++++++++++++++++  Food Choices for Gastroesophageal Reflux Disease  When you have gastroesophageal reflux disease (GERD), the foods you eat and your eating habits are very important. Choosing the right foods can help ease the discomfort of GERD. WHAT GENERAL GUIDELINES DO I NEED TO FOLLOW?  Choose fruits, vegetables, whole grains, low-fat dairy products, and low-fat meat, fish, and poultry.  Limit fats such as oils, salad dressings, butter, nuts, and avocado.  Keep a food diary to identify foods that cause symptoms.  Avoid foods  that cause reflux. These may be different for different people.  Eat frequent small meals instead of three large meals each day.  Eat your meals slowly, in a relaxed setting.  Limit fried foods.  Cook foods using methods other than frying.  Avoid drinking alcohol.  Avoid drinking large amounts of liquids with your meals.  Avoid bending over or lying down until 2-3 hours after eating.   WHAT FOODS ARE NOT RECOMMENDED? The following are some foods and drinks that may worsen your symptoms:  Vegetables Tomatoes. Tomato juice. Tomato and spaghetti sauce. Chili peppers. Onion and garlic. Horseradish. Fruits Oranges, grapefruit, and lemon (fruit and juice). Meats High-fat meats, fish, and poultry. This includes hot dogs, ribs, ham, sausage, salami, and bacon. Dairy Whole milk and chocolate milk. Sour cream. Cream. Butter. Ice cream. Cream cheese.  Beverages Coffee and tea, with or without caffeine. Carbonated beverages or energy drinks. Condiments Hot sauce. Barbecue sauce.  Sweets/Desserts Chocolate and cocoa. Donuts. Peppermint and spearmint. Fats and Oils High-fat foods, including [place] fries and potato chips. Other Vinegar. Strong spices, such as black pepper, white pepper, red pepper, cayenne, curry powder, cloves, ginger, and chili powder.       HOW TO TREAT VIRAL COUGH AND COLD SYMPTOMS:  -Symptoms usually last at least 1 week with the worst symptoms being around day 4.  - colds usually start with a sore throat and end with a cough, and the  cough can take 2 weeks to get better.  -No antibiotics are needed for colds, flu, sore throats, cough, bronchitis UNLESS symptoms are longer than 7 days OR if you are getting better then get drastically worse.  -There are a lot of combination medications (Dayquil, Nyquil, Vicks 44, tyelnol cold and sinus, ETC). Please look at the ingredients on the back so that you are treating the correct symptoms and not doubling up on  medications/ingredients.    Medicines you can use  Nasal congestion  - pseudoephedrine (Sudafed)- behind the counter, do not use if you have high blood pressure, medicine that have -D in them.  - phenylephrine (Sudafed PE) -Dextormethorphan + chlorpheniramine (Coridcidin HBP)- okay if you have high blood pressure -Oxymetazoline (Afrin) nasal spray- LIMIT to 3 days -Saline nasal spray -Neti pot (used distilled or bottled water)  Ear pain/congestion  -pseudoephedrine (sudafed) - Nasonex/flonase nasal spray  Fever  -Acetaminophen (Tyelnol) -Ibuprofen (Advil, motrin, aleve)  Sore Throat  -Acetaminophen (Tyelnol) -Ibuprofen (Advil, motrin, aleve) -Drink a lot of water -Gargle with salt water - Rest your voice (don't talk) -Throat sprays -Cough drops  Body Aches  -Acetaminophen (Tyelnol) -Ibuprofen (Advil, motrin, aleve)  Headache  -Acetaminophen (Tyelnol) -Ibuprofen (Advil, motrin, aleve) - Exedrin, Exedrin Migraine  Allergy symptoms (cough, sneeze, runny nose, itchy eyes) -Claritin or loratadine cheapest but likely the weakest  -Zyrtec or certizine at night because it can make you sleepy -The strongest is allegra or fexafinadine  Cheapest at [employer], [employer], [employer]  Cough  -Dextromethorphan (Delsym)- medicine that has DM in it -Guafenesin (Mucinex/Robitussin) - cough drops - drink lots of water  Chest Congestion  -Guafenesin (Mucinex/Robitussin)  Red Itchy Eyes  - Naphcon-A  Upset Stomach  - Bland diet (nothing spicy, greasy, fried, and high acid foods like tomatoes, oranges, berries) -OKAY- cereal, bread, soup, crackers, rice -Eat smaller more frequent meals -reduce caffeine, no alcohol -Loperamide (Imodium-AD) if diarrhea -Prevacid for heart burn  General health when sick  -Hydration -wash your hands frequently -keep surfaces clean -change pillow cases and sheets often -Get fresh air but do not exercise strenuously -Vitamin D, double up on it -  Vitamin C -Zinc

## 2017-02-24 NOTE — Progress Notes (Signed)
Assessment and Plan:  Marylouise StacksWilma was seen today for uri.  Diagnoses and all orders for this visit:  Acute bronchitis, unspecified organism - Discussed the importance of avoiding unnecessary antibiotic therapy - may fill if not improving in 3 days Suggested symptomatic OTC remedies. Nasal saline spray for congestion. Nasal steroids, allergy pill, oral steroids Follow up as needed. -     predniSONE (DELTASONE) 20 MG tablet; 2 tablets daily for 3 days, 1 tablet daily for 4 days. -     cephALEXin (KEFLEX) 500 MG capsule; Take 1 capsule (500 mg total) by mouth 2 (two) times daily for 10 days.  Cough -     promethazine-dextromethorphan (PROMETHAZINE-DM) 6.25-15 MG/5ML syrup; Take 5 mLs by mouth 4 (four) times daily as needed for cough.  Gastroesophageal reflux disease, esophagitis presence not specified GERD Symptoms well managed without breakthrough Will try to get off PPI given info for taper and zantac sent in -     ranitidine (ZANTAC) 150 MG tablet; Take 1 tablet (150 mg total) by mouth 2 (two) times daily.  Further disposition pending results of labs. Discussed med's effects and SE's.   Over 20 minutes of exam, counseling, chart review, and critical decision making was performed.   Future Appointments  Date Time Provider Department Center  04/13/2017  3:30 PM Judd Gaudierorbett, Delania Ferg, NP [facility] None  07/14/2017 10:00 AM [name], [name], MD [facility] None    ------------------------------------------------------------------------------------------------------------------   HPI BP 126/76   Pulse 70   Temp 99.1 F (37.3 C)   Ht 5\' 5"  (1.651 m)   Wt 143 lb (64.9 kg)   SpO2 97%   BMI 23.80 kg/m   76 y.o.female presents for 4 days of scratchy throat, "croupy" cough, sense of chest congetion - woke up this morning with sinus/nasal congestion.  She denies chest pain, palpitations, dyspnea, wheezing, fever/chills, HA, body aches, rash.      She takes fexofenadine daily for food  allergies, promethazine cough syrup (expired), mucinex, fluticasone, throat lozenges.   She does have brief + hx of smoking over 50 years ago; diagnosed with COPD due to hyperinflation noted on imaging. She is up to date on immunizations. No known sick contacts.  Past Medical History:  Diagnosis Date  . COPD (chronic obstructive pulmonary disease) (HCC)   . Fibromyalgia   . GERD (gastroesophageal reflux disease)   . IBS (irritable bowel syndrome)   . Scoliosis      Allergies  Allergen Reactions  . Altace [Ramipril]     cough  . Azithromycin     palpitation  . Celebrex [Celecoxib]     Gi upset   . Hydrochlorothiazide     cramping  . 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 Prilosec [Omeprazole]     constipation    Current Outpatient Medications on File Prior to Visit  Medication Sig  . aspirin 81 MG tablet Take 81 mg by mouth daily.  Marland Kitchen. CALCIUM CITRATE PO Take 500 mg by mouth daily.  . Cholecalciferol (VITAMIN D) 2000 UNITS tablet Take 2,000 Units by mouth 2 (two) times daily.  Marland Kitchen. DILT-XR 180 MG 24 hr capsule TAKE 1 CAPSULE BY MOUTH DAILY  . ezetimibe-simvastatin (VYTORIN) 10-20 MG tablet TAKE 1 TABLET BY MOUTH EVERY DAY  . fexofenadine (ALLEGRA) 180 MG tablet Take 180 mg by mouth daily.  . fluticasone (FLONASE) 50 MCG/ACT nasal spray INSTILL 2 SPRAYS IN EACH NOSTRIL TWICE DAILY  . fluticasone (VERAMYST) 27.5 MCG/SPRAY nasal spray Place  2 sprays into the nose daily as needed for rhinitis.  . hydrochlorothiazide (HYDRODIURIL) 25 MG tablet TAKE 1 TABLET BY MOUTH EVERY DAY FOR FLUID RETENTION (Patient taking differently: 1/2 tablet as needed)  . IRON PO Take 50 mg by mouth daily.  Marland Kitchen. labetalol (NORMODYNE) 100 MG tablet TAKE 1 AND 1/2 TABLETS BY MOUTH TWICE DAILY  . losartan (COZAAR) 25 MG tablet Take 25 mg by mouth daily.  . Magnesium 400 MG CAPS Take by mouth daily.  . meloxicam (MOBIC) 15 MG tablet Take 1/2 to 1 tablet daily on a full stomach for  pain & inflammation  . Multiple Vitamins-Minerals (MULTIVITAMIN WITH MINERALS) tablet Take 1 tablet by mouth daily.  . nitroGLYCERIN (NITROSTAT) 0.4 MG SL tablet Place 1 tablet (0.4 mg total) under the tongue every 5 (five) minutes as needed for chest pain.  . pantoprazole (PROTONIX) 40 MG tablet TAKE 1 TABLET BY MOUTH TWICE DAILY 30 MINUTES BEFORE BREAKFAST AND DINNER  . vitamin C (ASCORBIC ACID) 500 MG tablet Take 500 mg by mouth daily.  Marland Kitchen. VYTORIN 10-20 MG tablet TAKE 1 TABLET BY MOUTH EVERY DAY   No current facility-administered medications on file prior to visit.     ROS: Review of Systems  Constitutional: Negative for chills, diaphoresis, fever and malaise/fatigue.  HENT: Positive for congestion and sore throat. Negative for ear discharge, ear pain, hearing loss, sinus pain and tinnitus.   Eyes: Negative for blurred vision, pain, discharge and redness.  Respiratory: Positive for cough. Negative for hemoptysis, sputum production, shortness of breath, wheezing and stridor.   Cardiovascular: Negative for chest pain, palpitations, orthopnea and leg swelling.  Gastrointestinal: Negative for abdominal pain, diarrhea, heartburn, nausea and vomiting.  Genitourinary: Negative.   Musculoskeletal: Negative for joint pain and myalgias.  Skin: Negative for rash.  Neurological: Negative for dizziness, sensory change, weakness and headaches.  Endo/Heme/Allergies: Negative for environmental allergies.  Psychiatric/Behavioral: Negative.   All other systems reviewed and are negative.    Physical Exam:  BP 126/76   Pulse 70   Temp 99.1 F (37.3 C)   Ht 5\' 5"  (1.651 m)   Wt 143 lb (64.9 kg)   SpO2 97%   BMI 23.80 kg/m   General Appearance: Well nourished, in no apparent distress. Eyes: PERRLA, EOMs, conjunctiva no swelling or erythema Sinuses: No Frontal/maxillary tenderness ENT/Mouth: Ext aud canals clear, TMs without erythema, bulging. No erythema, swelling, or exudate on post pharynx.   Tonsils not swollen or erythematous. Hearing normal.  Neck: Supple.  Respiratory: Respiratory effort normal, BS present throughout with mild scant rales to RUL, without  rhonchi, wheezing or stridor.  Cardio: RRR with no MRGs. Brisk peripheral pulses without edema.  Abdomen: Soft, + BS.  Non tender. Lymphatics: Non tender without lymphadenopathy.  Musculoskeletal: normal gait.  Skin: Warm, dry without rashes, lesions, ecchymosis.  Neuro: Cranial nerves intact. Normal muscle tone, no cerebellar symptoms. Sensation intact.  Psych: Awake and oriented X 3, normal affect, Insight and Judgment appropriate.     Dan MakerAshley C Donnarae Rae, NP 11:39 AM [facility]

## 2017-03-10 ENCOUNTER — Encounter: Payer: Self-pay | Admitting: Adult Health

## 2017-03-10 ENCOUNTER — Ambulatory Visit: Payer: Medicare Other | Admitting: Adult Health

## 2017-03-10 VITALS — BP 102/60 | HR 65 | Temp 97.7°F | Ht 65.0 in | Wt 145.0 lb

## 2017-03-10 DIAGNOSIS — R05 Cough: Secondary | ICD-10-CM | POA: Diagnosis not present

## 2017-03-10 DIAGNOSIS — J209 Acute bronchitis, unspecified: Secondary | ICD-10-CM | POA: Diagnosis not present

## 2017-03-10 DIAGNOSIS — R059 Cough, unspecified: Secondary | ICD-10-CM

## 2017-03-10 MED ORDER — DOXYCYCLINE HYCLATE 100 MG PO CAPS: ORAL_CAPSULE | ORAL | 0 refills | Status: DC

## 2017-03-10 MED ORDER — BENZONATATE 100 MG PO CAPS: 200.0000 mg | ORAL_CAPSULE | Freq: Three times a day (TID) | ORAL | 0 refills | Status: DC | PRN

## 2017-03-10 MED ORDER — PREDNISONE 20 MG PO TABS: ORAL_TABLET | ORAL | 0 refills | Status: DC

## 2017-03-10 NOTE — Progress Notes (Signed)
Assessment and Plan:  Jasmine Reeves was seen today for uri.  Diagnoses and all orders for this visit:  Cough -     benzonatate (TESSALON PERLES) 100 MG capsule; Take 2 capsules (200 mg total) by mouth 3 (three) times daily as needed for cough (Max: 600mg  per day).  Acute bronchitis, unspecified organism ? Possible early pneumonia; CXR closed for today so will defer -  -     predniSONE (DELTASONE) 20 MG tablet; 2 tablets daily for 3 days, 1 tablet daily for 4 days. -     doxycycline (VIBRAMYCIN) 100 MG capsule; Take 1 capsule 2 x/day with food for 10 days        Further disposition pending results of labs. Discussed med's effects and SE's.   Over 15 minutes of exam, counseling, chart review, and critical decision making was performed.   Future Appointments  Date Time Provider Department Center  04/13/2017  3:30 PM Judd Gaudier, NP [facility] None  07/14/2017 10:00 AM [name], MD [facility] None    ----------------------------------------------------------------------------------------------  HPI BP 102/60   Pulse 65   Temp 97.7 F (36.5 C)   Ht 5\' 5"  (1.651 m)   Wt 145 lb (65.8 kg)   SpO2 97%   BMI 24.13 kg/m   Jasmine Reeves presents for 3 weeks of ongoing URI - cough, chest congestion, mild fever/chills.  Keflex and prednisone were prescribed at the last visit; she completed this and was feeling better, but then suddenly began feeling much worse again today- woke up coughing, with malaise - has been sleeping a lot. She does endorse mild fever at home 99 degrees farenheit. She has not taken any medication today. Cough, congestion, chest fatigue after coughing are her main concerns today.   She has hx of stable mild COPD noted on CXR - monitored and typically manages well without medication.   Past Medical History:  Diagnosis Date  . COPD (chronic obstructive pulmonary disease) (HCC)   . Fibromyalgia   . GERD (gastroesophageal reflux disease)   . IBS (irritable bowel  syndrome)   . Scoliosis     Allergies  Allergen Reactions  . Altace [Ramipril]     cough  . Azithromycin     palpitation  . Celebrex [Celecoxib]     Gi upset   . Hydrochlorothiazide     cramping  . 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 Prilosec [Omeprazole]     constipation    Current Outpatient Medications on File Prior to Visit  Medication Sig  . aspirin 81 MG tablet Take 81 mg by mouth daily.  Marland Kitchen CALCIUM CITRATE PO Take 500 mg by mouth daily.  . Cholecalciferol (VITAMIN D) 2000 UNITS tablet Take 2,000 Units by mouth 2 (two) times daily.  Marland Kitchen DILT-XR 180 MG 24 hr capsule TAKE 1 CAPSULE BY MOUTH DAILY  . ezetimibe-simvastatin (VYTORIN) 10-20 MG tablet TAKE 1 TABLET BY MOUTH EVERY DAY  . fexofenadine (ALLEGRA) 180 MG tablet Take 180 mg by mouth daily.  . fluticasone (FLONASE) 50 MCG/ACT nasal spray INSTILL 2 SPRAYS IN EACH NOSTRIL TWICE DAILY  . fluticasone (VERAMYST) 27.5 MCG/SPRAY nasal spray Place 2 sprays into the nose daily as needed for rhinitis.  . hydrochlorothiazide (HYDRODIURIL) 25 MG tablet TAKE 1 TABLET BY MOUTH EVERY DAY FOR FLUID RETENTION (Patient taking differently: 1/2 tablet as needed)  . IRON PO Take 50 mg by mouth daily.  Marland Kitchen labetalol (NORMODYNE) 100 MG tablet TAKE 1 AND 1/2 TABLETS  BY MOUTH TWICE DAILY  . losartan (COZAAR) 25 MG tablet Take 25 mg by mouth daily.  . Magnesium 400 MG CAPS Take by mouth daily.  . meloxicam (MOBIC) 15 MG tablet Take 1/2 to 1 tablet daily on a full stomach for pain & inflammation  . Multiple Vitamins-Minerals (MULTIVITAMIN WITH MINERALS) tablet Take 1 tablet by mouth daily.  . nitroGLYCERIN (NITROSTAT) 0.4 MG SL tablet Place 1 tablet (0.4 mg total) under the tongue every 5 (five) minutes as needed for chest pain.  . pantoprazole (PROTONIX) 40 MG tablet TAKE 1 TABLET BY MOUTH TWICE DAILY 30 MINUTES BEFORE BREAKFAST AND DINNER  . promethazine-dextromethorphan (PROMETHAZINE-DM) 6.25-15 MG/5ML syrup Take 5  mLs by mouth 4 (four) times daily as needed for cough.  . ranitidine (ZANTAC) 150 MG tablet Take 1 tablet (150 mg total) by mouth 2 (two) times daily.  . vitamin C (ASCORBIC ACID) 500 MG tablet Take 500 mg by mouth daily.  Marland Kitchen. VYTORIN 10-20 MG tablet TAKE 1 TABLET BY MOUTH EVERY DAY   No current facility-administered medications on file prior to visit.     ROS: all negative except above.   Physical Exam:  BP 102/60   Pulse 65   Temp 97.7 F (36.5 C)   Ht 5\' 5"  (1.651 m)   Wt 145 lb (65.8 kg)   SpO2 97%   BMI 24.13 kg/m   General Appearance: Well nourished, in no apparent distress. Eyes: PERRLA, EOMs, conjunctiva no swelling or erythema Sinuses: No Frontal/maxillary tenderness ENT/Mouth: Ext aud canals clear, TMs without erythema, bulging. No erythema, swelling, or exudate on post pharynx.  Tonsils not swollen or erythematous. Hearing normal.  Neck: Supple, thyroid normal.  Respiratory: Respiratory effort normal, BS somewhat diminished in bilateral lower lobes without rales, rhonchi, wheezing or stridor.  Cardio: RRR with no MRGs. Brisk peripheral pulses without edema.  Abdomen: Soft, + BS.  Non tender, no guarding, rebound, hernias, masses. Lymphatics: Non tender without lymphadenopathy.  Musculoskeletal: Full ROM, 5/5 strength, normal gait.  Skin: Warm, dry without rashes, lesions, ecchymosis.  Psych: Awake and oriented X 3, normal affect, Insight and Judgment appropriate.     Dan MakerAshley C Timathy Newberry, NP 6:18 PM [facility]

## 2017-03-17 ENCOUNTER — Telehealth: Payer: Self-pay

## 2017-03-17 ENCOUNTER — Other Ambulatory Visit: Payer: Self-pay | Admitting: Adult Health

## 2017-03-17 DIAGNOSIS — R05 Cough: Secondary | ICD-10-CM

## 2017-03-17 DIAGNOSIS — J449 Chronic obstructive pulmonary disease, unspecified: Secondary | ICD-10-CM

## 2017-03-17 DIAGNOSIS — R059 Cough, unspecified: Secondary | ICD-10-CM

## 2017-03-17 NOTE — Telephone Encounter (Signed)
Patient states that she thought she was feeling better but woke up this morning feeling worse. Heaviness in chest-could she get another chest x-ray done?

## 2017-03-17 NOTE — Telephone Encounter (Signed)
Spoke with patient. X-ray was ordered. Patient was also advised to go to the ER if she felt that she couldn't wait or had gotten worse.

## 2017-03-18 ENCOUNTER — Ambulatory Visit (HOSPITAL_COMMUNITY)
Admission: RE | Admit: 2017-03-18 | Discharge: 2017-03-18 | Disposition: A | Discharge status: Home / Self Care | Payer: Medicare Other | Source: Ambulatory Visit | Attending: Adult Health | Admitting: Adult Health

## 2017-03-18 DIAGNOSIS — R059 Cough, unspecified: Secondary | ICD-10-CM

## 2017-03-18 DIAGNOSIS — I2510 Atherosclerotic heart disease of native coronary artery without angina pectoris: Secondary | ICD-10-CM | POA: Insufficient documentation

## 2017-03-18 DIAGNOSIS — J449 Chronic obstructive pulmonary disease, unspecified: Secondary | ICD-10-CM | POA: Insufficient documentation

## 2017-03-18 DIAGNOSIS — R0602 Shortness of breath: Secondary | ICD-10-CM | POA: Diagnosis present

## 2017-03-18 DIAGNOSIS — R05 Cough: Secondary | ICD-10-CM

## 2017-04-12 DIAGNOSIS — M8580 Other specified disorders of bone density and structure, unspecified site: Secondary | ICD-10-CM | POA: Insufficient documentation

## 2017-04-12 DIAGNOSIS — M810 Age-related osteoporosis without current pathological fracture: Secondary | ICD-10-CM | POA: Insufficient documentation

## 2017-04-12 DIAGNOSIS — M8000XA Age-related osteoporosis with current pathological fracture, unspecified site, initial encounter for fracture: Secondary | ICD-10-CM | POA: Insufficient documentation

## 2017-04-12 NOTE — Progress Notes (Signed)
MEDICARE ANNUAL WELLNESS VISIT AND FOLLOW UP  Assessment:   Diagnoses and all orders for this visit:  Encounter for Medicare annual wellness exam  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) Control blood pressure, cholesterol, glucose, increase exercise.  Prescribed nitroglycerine without recent use Followed by cardiology  Essential hypertension At goal; continue medications Monitor blood pressure at home; call if consistently over 130/80 Continue DASH diet.   Reminder to go to the ER if any CP, SOB, nausea, dizziness, severe HA, changes vision/speech, left arm numbness and tingling and jaw pain.  MVP (mitral valve prolapse) Continue BB, monitor.   Raynaud's disease without gangrene No complications, Monitor  Chronic obstructive pulmonary disease, unspecified COPD type (HCC) By imaging; denies notable symptoms of dyspnea, secretions, continue to monitor. Remote hx of smoking.   OSA on CPAP Reports typically wears 100% of the time and endorses restorative sleep, had stopped wearing while recovering from URI, will restart  Gastroesophageal reflux disease, esophagitis presence not specified Has ongoing cough ? Related to recent URI; would like to postpone completing taper at this time. This has been discussed at length in the past r/t risks associated to chronic PPI use  Abnormal blood sugar Has been prediabetic; most recent A1C at goal Discussed disease and risks Discussed diet/exercise, weight management  -     Hemoglobin A1c  Medication management -     CBC with Differential/Platelet -     BASIC METABOLIC PANEL WITH GFR -     Hepatic function panel  Mixed hyperlipidemia At goal; continue medication Continue low cholesterol diet and exercise.  -     Lipid panel -     TSH  Vitamin D deficiency Continue supplementation for goal of 70-100 -     VITAMIN D 25 Hydroxy (Vit-D Deficiency,  Fractures)  Osteopenia of left forearm By DEXA L forearm T -2.2 in 2018; followed by Dr. Jennette Kettle Continue dietary calcium, vitamin D supplement, recommend weight bearing exercises -     VITAMIN D 25 Hydroxy (Vit-D Deficiency, Fractures)   Over 40 minutes of exam, counseling, chart review and critical decision making was performed Future Appointments  Date Time Provider Department Center  06/03/2017  2:20 PM Lennette Bihari, MD [facility]  07/14/2017 10:00 AM [name], MD [facility] None     Plan:   During the course of the visit the patient was educated and counseled about appropriate screening and preventive services including:    Pneumococcal vaccine   Prevnar 13  Influenza vaccine  Td vaccine  Screening electrocardiogram  Bone densitometry screening  Colorectal cancer screening  Diabetes screening  Glaucoma screening  Nutrition counseling   Advanced directives: requested   Subjective:  Jasmine Reeves is a 77 y.o. female who presents for Medicare Annual Wellness Visit and 3 month follow up. 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 , she underwent CABG. Negative myocardial scan noted in 2015. Continues to follow up with Dr. Tresa Endo. Follows annually with Dr. Jennette Kettle for mammograms and PAPs.   she has a diagnosis of GERD which is currently managed by protonix 40 mg daily in PM, ranitidine 150 mg in AM.  she reports symptoms is not currently well controlled, and denies breakthrough reflux, burning in chest, hoarseness but endorses intermittent ongoing dry cough.  She is recovering from month long bronchitis/URI - ? Related to this. Not notably associated with food  or positioning. Etiologies of cough discussed at length.   BMI is Body mass index is 24.3 kg/m., she has been working on diet and exercise. Wt Readings from Last 3 Encounters:  04/13/17 146 lb (66.2 kg)  03/10/17 145 lb (65.8 kg)  02/24/17 143 lb (64.9 kg)    Her blood  pressure has been controlled at home, today their BP is BP: 102/62 She does not workout. She denies chest pain, shortness of breath, dizziness.   She is on cholesterol medication and denies myalgias. Her cholesterol is at goal. The cholesterol last visit was:   Lab Results  Component Value Date   CHOL 158 01/07/2017   HDL 72 01/07/2017   LDLCALC 69 10/04/2016   TRIG 85 01/07/2017   CHOLHDL 2.2 01/07/2017    She has been working on diet and exercise for glucose management, and denies increased appetite, nausea, paresthesia of the feet, polydipsia, polyuria, visual disturbances, vomiting and weight loss. Last A1C in the office was:  Lab Results  Component Value Date   HGBA1C 5.5 01/07/2017   Last GFR demonstrates stage 2 ckd: Lab Results  Component Value Date   GFRNONAA 67 01/07/2017   Patient is on Vitamin D supplement but remained below goal at most recent check:  Lab Results  Component Value Date   VD25OH 59 01/07/2017      Medication Review: Current Outpatient Medications on File Prior to Visit  Medication Sig Dispense Refill  . aspirin 81 MG tablet Take 81 mg by mouth daily.    Marland Kitchen CALCIUM CITRATE PO Take 500 mg by mouth daily.    . Cholecalciferol (VITAMIN D) 2000 UNITS tablet Take 2,000 Units by mouth 2 (two) times daily.    Marland Kitchen DILT-XR 180 MG 24 hr capsule TAKE 1 CAPSULE BY MOUTH DAILY 90 capsule 3  . ezetimibe-simvastatin (VYTORIN) 10-20 MG tablet TAKE 1 TABLET BY MOUTH EVERY DAY 90 tablet 0  . fexofenadine (ALLEGRA) 180 MG tablet Take 180 mg by mouth daily.    . fluticasone (FLONASE) 50 MCG/ACT nasal spray INSTILL 2 SPRAYS IN EACH NOSTRIL TWICE DAILY 48 g 3  . fluticasone (VERAMYST) 27.5 MCG/SPRAY nasal spray Place 2 sprays into the nose daily as needed for rhinitis. 10 g 8  . IRON PO Take 50 mg by mouth daily.    Marland Kitchen labetalol (NORMODYNE) 100 MG tablet TAKE 1 AND 1/2 TABLETS BY MOUTH TWICE DAILY 270 tablet 1  . losartan (COZAAR) 25 MG tablet Take 25 mg by mouth daily.     . Magnesium 400 MG CAPS Take by mouth daily.    . meloxicam (MOBIC) 15 MG tablet Take 1/2 to 1 tablet daily on a full stomach for pain & inflammation 90 tablet 1  . Multiple Vitamins-Minerals (MULTIVITAMIN WITH MINERALS) tablet Take 1 tablet by mouth daily.    . nitroGLYCERIN (NITROSTAT) 0.4 MG SL tablet Place 1 tablet (0.4 mg total) under the tongue every 5 (five) minutes as needed for chest pain. 25 tablet 12  . pantoprazole (PROTONIX) 40 MG tablet TAKE 1 TABLET BY MOUTH TWICE DAILY 30 MINUTES BEFORE BREAKFAST AND DINNER (Patient taking differently: TAKE 1 TABLET BY MOUTH IN THE EVENING) 180 tablet 0  . ranitidine (ZANTAC) 150 MG tablet Take 1 tablet (150 mg total) by mouth 2 (two) times daily. (Patient taking differently: Take 150 mg by mouth every morning. ) 180 tablet 1  . vitamin C (ASCORBIC ACID) 500 MG tablet Take 500 mg by mouth daily.    Marland Kitchen VYTORIN  10-20 MG tablet TAKE 1 TABLET BY MOUTH EVERY DAY 90 tablet 3  . benzonatate (TESSALON PERLES) 100 MG capsule Take 2 capsules (200 mg total) by mouth 3 (three) times daily as needed for cough (Max: 600mg  per day). (Patient not taking: Reported on 04/13/2017) 60 capsule 0  . doxycycline (VIBRAMYCIN) 100 MG capsule Take 1 capsule 2 x/day with food for 10 days (Patient not taking: Reported on 04/13/2017) 20 capsule 0  . hydrochlorothiazide (HYDRODIURIL) 25 MG tablet TAKE 1 TABLET BY MOUTH EVERY DAY FOR FLUID RETENTION (Patient not taking: Reported on 04/13/2017) 90 tablet 3  . predniSONE (DELTASONE) 20 MG tablet 2 tablets daily for 3 days, 1 tablet daily for 4 days. (Patient not taking: Reported on 04/13/2017) 10 tablet 0  . promethazine-dextromethorphan (PROMETHAZINE-DM) 6.25-15 MG/5ML syrup Take 5 mLs by mouth 4 (four) times daily as needed for cough. (Patient not taking: Reported on 04/13/2017) 240 mL 1   No current facility-administered medications on file prior to visit.     Allergies  Allergen Reactions  . Altace [Ramipril]     cough  .  Azithromycin     palpitation  . Celebrex [Celecoxib]     Gi upset   . Hydrochlorothiazide     cramping  . 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 Prilosec [Omeprazole]     constipation    Current Problems (verified) Patient Active Problem List   Diagnosis Date Noted  . Osteopenia 04/12/2017  . OSA on CPAP 11/27/2014  . IBS (irritable bowel syndrome) 11/11/2014  . Encounter for Medicare annual wellness exam 11/11/2014  . Medication management 04/12/2014  . Abnormal blood sugar 10/10/2013  . Mixed hyperlipidemia 10/08/2013  . CAD (coronary artery disease) 02/03/2013  . MVP (mitral valve prolapse) 02/03/2013  . Vitamin D deficiency   . GERD (gastroesophageal reflux disease)   . COPD (chronic obstructive pulmonary disease) (HCC) 02/11/2010  . Essential hypertension 01/28/2010  . RAYNAUDS SYNDROME 01/28/2010    Screening Tests Immunization History  Administered Date(s) Administered  . Influenza, High Dose Seasonal PF 11/11/2014, 12/08/2015  . Influenza,inj,Quad PF,6+ Mos 11/27/2016  . Pneumococcal Conjugate-13 04/16/2014  . Pneumococcal-Unspecified 03/01/1998, 03/12/2010  . Td 03/27/2012  . Zoster 03/02/2007   Preventative care: Last colonoscopy: 06/2012 Last mammogram: 07/2016 at Dr. Donnetta Hail office Last pap smear/pelvic exam: 2014 (Dr. Jennette Kettle)  DEXA: 2018 gets at Dr. Donnetta Hail office CXR 03/2017 Stress test 08/2013 Echo 2012 Cath 2008  Prior vaccinations: TD or Tdap: 2014  Influenza: 2018 Pneumococcal: 2012 Prevnar 13: 2016 Shingles/Zostavax: 2009  Names of Other Physician/Practitioners you currently use: 1. [facility] here for primary care 2. Dr. Dione Booze, eye doctor, last visit 2018 3. [facility], dentist, last visit q 6 months  Patient Care Team: [name], MD as PCP - General (Internal Medicine) Lennette Bihari, MD as Consulting Physician (Cardiology) Bernette Redbird, MD as Consulting  Physician (Gastroenterology) Freddy Finner, MD as Consulting Physician (Obstetrics and Gynecology)  SURGICAL HISTORY She  has a past surgical history that includes Tonsillectomy; Appendectomy; Abdominal hysterectomy; Tubal ligation; Coronary artery bypass graft; Coronary angioplasty (12/11); and Esophagogastroduodenoscopy (07/21/12). FAMILY HISTORY Her family history includes Heart disease in her brother; Hyperlipidemia in her brother. SOCIAL HISTORY She  reports that she quit smoking about 51 years ago. she has never used smokeless tobacco. She reports that she drinks about 1.8 oz of alcohol per week. She reports that she does not use drugs.   MEDICARE WELLNESS  OBJECTIVES: Physical activity: Current Exercise Habits: The patient does not participate in regular exercise at present, Exercise limited by: orthopedic condition(s) Cardiac risk factors: Cardiac Risk Factors include: advanced age (>61men, >34 women);dyslipidemia;hypertension;sedentary lifestyle;family history of premature cardiovascular disease Depression/mood screen:   Depression screen [facility] 2/9 04/13/2017  Decreased Interest 0  Down, Depressed, Hopeless 0  PHQ - 2 Score 0    ADLs:  In your present state of health, do you have any difficulty performing the following activities: 04/13/2017 01/08/2017  Hearing? N N  Vision? N N  Difficulty concentrating or making decisions? N N  Walking or climbing stairs? N N  Dressing or bathing? N N  Doing errands, shopping? N N  Some recent data might be hidden     Cognitive Testing  Alert? Yes  Normal Appearance?Yes  Oriented to person? Yes  Place? Yes   Time? Yes  Recall of three objects?  Yes  Can perform simple calculations? Yes  Displays appropriate judgment?Yes  Can read the correct time from a watch face?Yes  EOL planning: Does Patient Have a Medical Advance Directive?: Yes Type of Advance Directive: Healthcare Power of Attorney Does patient want to make changes to medical  advance directive?: No - Patient declined Copy of Healthcare Power of Attorney in Chart?: No - copy requested  Review of Systems  Constitutional: Negative for chills, fever, malaise/fatigue and weight loss.  HENT: Negative for congestion, hearing loss, sore throat and tinnitus.   Eyes: Negative for blurred vision and double vision.  Respiratory: Positive for cough (Dry, intermittent). Negative for sputum production, shortness of breath and wheezing.   Cardiovascular: Negative for chest pain, palpitations, orthopnea, claudication and leg swelling.  Gastrointestinal: Negative for abdominal pain, blood in stool, constipation, diarrhea, heartburn, melena, nausea and vomiting.  Genitourinary: Negative.   Musculoskeletal: Negative for falls, joint pain and myalgias.  Skin: Negative for rash.  Neurological: Negative for dizziness, tingling, sensory change, weakness and headaches.  Endo/Heme/Allergies: Negative for environmental allergies and polydipsia.  Psychiatric/Behavioral: Negative.  Negative for depression, memory loss and substance abuse. The patient is not nervous/anxious and does not have insomnia.   All other systems reviewed and are negative.    Objective:     Today's Vitals   04/13/17 1542  BP: 102/62  Pulse: 66  Temp: 98.1 F (36.7 C)  SpO2: 97%  Weight: 146 lb (66.2 kg)  Height: 5\' 5"  (1.651 m)   Body mass index is 24.3 kg/m.  General appearance: alert, no distress, WD/WN, female HEENT: normocephalic, sclerae anicteric, TMs pearly, nares patent, no discharge or erythema, pharynx normal Oral cavity: MMM, no lesions Neck: supple, no lymphadenopathy, no thyromegaly, no masses Heart: RRR, normal S1, S2, no murmurs Lungs: CTA bilaterally, no wheezes, rhonchi, or rales Abdomen: +bs, soft, non tender, non distended, no masses, no hepatomegaly, no splenomegaly Musculoskeletal: nontender, no swelling, no obvious deformity Extremities: no edema, no cyanosis, no  clubbing Pulses: 2+ symmetric, upper and lower extremities, normal cap refill Neurological: alert, oriented x 3, CN2-12 intact, strength normal upper extremities and lower extremities, sensation normal throughout, DTRs 2+ throughout, no cerebellar signs, gait normal Psychiatric: normal affect, behavior normal, pleasant   Medicare Attestation I have personally reviewed: The patient's medical and social history Their use of alcohol, tobacco or illicit drugs Their current medications and supplements The patient's functional ability including ADLs,fall risks, home safety risks, cognitive, and hearing and visual impairment Diet and physical activities Evidence for depression or mood disorders  The patient's weight, height, BMI, and  visual acuity have been recorded in the chart.  I have made referrals, counseling, and provided education to the patient based on review of the above and I have provided the patient with a written personalized care plan for preventive services.     Dan MakerAshley C Cherina Dhillon, NP   04/13/2017

## 2017-04-13 ENCOUNTER — Ambulatory Visit: Payer: Medicare Other | Admitting: Adult Health

## 2017-04-13 ENCOUNTER — Encounter: Payer: Self-pay | Admitting: Adult Health

## 2017-04-13 VITALS — BP 102/62 | HR 66 | Temp 98.1°F | Ht 65.0 in | Wt 146.0 lb

## 2017-04-13 DIAGNOSIS — K589 Irritable bowel syndrome without diarrhea: Secondary | ICD-10-CM | POA: Diagnosis not present

## 2017-04-13 DIAGNOSIS — M85832 Other specified disorders of bone density and structure, left forearm: Secondary | ICD-10-CM

## 2017-04-13 DIAGNOSIS — I7300 Raynaud's syndrome without gangrene: Secondary | ICD-10-CM

## 2017-04-13 DIAGNOSIS — Z9989 Dependence on other enabling machines and devices: Secondary | ICD-10-CM

## 2017-04-13 DIAGNOSIS — J209 Acute bronchitis, unspecified: Secondary | ICD-10-CM

## 2017-04-13 DIAGNOSIS — J449 Chronic obstructive pulmonary disease, unspecified: Secondary | ICD-10-CM

## 2017-04-13 DIAGNOSIS — Z79899 Other long term (current) drug therapy: Secondary | ICD-10-CM | POA: Diagnosis not present

## 2017-04-13 DIAGNOSIS — R7309 Other abnormal glucose: Secondary | ICD-10-CM

## 2017-04-13 DIAGNOSIS — E782 Mixed hyperlipidemia: Secondary | ICD-10-CM

## 2017-04-13 DIAGNOSIS — Z0001 Encounter for general adult medical examination with abnormal findings: Secondary | ICD-10-CM | POA: Diagnosis not present

## 2017-04-13 DIAGNOSIS — R6889 Other general symptoms and signs: Secondary | ICD-10-CM | POA: Diagnosis not present

## 2017-04-13 DIAGNOSIS — Z0000 Encounter for general adult medical examination without abnormal findings: Secondary | ICD-10-CM

## 2017-04-13 DIAGNOSIS — I10 Essential (primary) hypertension: Secondary | ICD-10-CM

## 2017-04-13 DIAGNOSIS — G4733 Obstructive sleep apnea (adult) (pediatric): Secondary | ICD-10-CM | POA: Diagnosis not present

## 2017-04-13 DIAGNOSIS — I341 Nonrheumatic mitral (valve) prolapse: Secondary | ICD-10-CM

## 2017-04-13 DIAGNOSIS — E559 Vitamin D deficiency, unspecified: Secondary | ICD-10-CM | POA: Diagnosis not present

## 2017-04-13 DIAGNOSIS — I25118 Atherosclerotic heart disease of native coronary artery with other forms of angina pectoris: Secondary | ICD-10-CM

## 2017-04-13 DIAGNOSIS — K219 Gastro-esophageal reflux disease without esophagitis: Secondary | ICD-10-CM

## 2017-04-13 MED ORDER — PREDNISONE 20 MG PO TABS: ORAL_TABLET | ORAL | 0 refills | Status: DC

## 2017-04-13 NOTE — Patient Instructions (Addendum)
Common causes of cough OR hoarseness OR sore throat:   Allergies, Viral Infections, Acid Reflux and Bacterial Infections.   Allergies and viral infections cause a cough OR sore throat by post nasal drip and are often worse at night, can also have sneezing, lower grade fevers, clear/yellow mucus. This is best treated with allergy medications or nasal sprays.  Please get on allegra for 1-2 weeks The strongest is allegra or fexafinadine  Cheapest at [employer], [employer], [employer]   Bacterial infections are more severe than allergies or viral infections with fever, teeth pain, fatigue. This can be treated with prednisone and the same over the counter medication and after 7 days can be treated with an antibiotic.   Silent reflux/GERD can cause a cough OR sore throat OR hoarseness WITHOUT heart burn because the esophagus that goes to the stomach and trachea that goes to the lungs are very close and when you lay down the acid can irritate your throat and lungs. This can cause hoarseness, cough, and wheezing. Please stop any alcohol or anti-inflammatories like aleve/advil/ibuprofen and start an over the counter Prilosec or omeprazole 1-2 times daily before food for 2 weeks, then switch to over the counter zantac/ratinidine or pepcid/famotadine once at night for 2 weeks.    sometimes irritation causes more irritation. Try voice rest, use sugar free cough drops to prevent coughing, and try to stop clearing your throat.   If you ever have a cough that does not go away after trying these things please make a follow up visit for further evaluation or we can refer you to a specialist. Or if you ever have shortness of breath or chest pain go to the ER.       If diarrhea is severe - 4+ watery diarrhea episodes, take imodium to reduce fluid loss, and start on electrolyte supplemented fluids.   Please go to the ER if you have any severe AB pain, unable to hold down food/water, blood in stool or vomit, chest pain,  shortness of breath, or any worsening symptoms.   Consider keeping a food diary- common causes of diarrhea are dairy, certain carbs...  FODMAP stands for fermentable oligo-, di-, mono-saccharides and polyols (1). These are the scientific terms used to classify groups of carbs that are notorious for triggering digestive symptoms like bloating, gas and stomach pain.   FODMAPs are found in a wide range of foods in varying amounts. Some foods contain just one type, while others contain several.  The main dietary sources of the four groups of FODMAPs include:  Oligosaccharides: Wheat, rye, legumes and various fruits and vegetables, such as garlic and onions.  Disaccharides: Milk, yogurt and soft cheese. Lactose is the main carb.  Monosaccharides: Various fruit including figs and mangoes, and sweeteners such as honey and agave nectar. Fructose is the main carb.  Polyols: Certain fruits and vegetables including blackberries and lychee, as well as some low-calorie sweeteners like those in sugar-free gum.   Keep a food diary. This will help you identify foods that cause symptoms. Write down: ? What you eat and when. ? What symptoms you have. ? When symptoms occur in relation to your meals.  Avoid foods that cause symptoms. Talk with your dietitian about other ways to get the same nutrients that are in these foods.  Eat your meals slowly, in a relaxed setting.  Aim to eat 5-6 small meals per day. Do not skip meals.  Drink enough fluids to keep your urine clear or pale yellow.  Ask your health care provider if you should take an over-the-counter probiotic during flare-ups to help restore healthy gut bacteria.  If you have cramping or diarrhea, try making your meals low in fat and high in carbohydrates. Examples of carbohydrates are pasta, rice, whole grain breads and cereals, fruits, and vegetables.  If dairy products cause your symptoms to flare up, try eating less of them. You might be  able to handle yogurt better than other dairy products because it contains bacteria that help with digestion.

## 2017-04-14 LAB — HEPATIC FUNCTION PANEL
AG Ratio: 2.6 (calc) — ABNORMAL HIGH (ref 1.0–2.5)
ALT: 19 U/L (ref 6–29)
AST: 19 U/L (ref 10–35)
Albumin: 4.2 g/dL (ref 3.6–5.1)
Alkaline phosphatase (APISO): 55 U/L (ref 33–130)
Bilirubin, Direct: 0.1 mg/dL (ref 0.0–0.2)
Globulin: 1.6 g/dL (calc) — ABNORMAL LOW (ref 1.9–3.7)
Indirect Bilirubin: 0.3 mg/dL (calc) (ref 0.2–1.2)
Total Bilirubin: 0.4 mg/dL (ref 0.2–1.2)
Total Protein: 5.8 g/dL — ABNORMAL LOW (ref 6.1–8.1)

## 2017-04-14 LAB — BASIC METABOLIC PANEL WITH GFR
BUN: 25 mg/dL (ref 7–25)
CO2: 27 mmol/L (ref 20–32)
Calcium: 9 mg/dL (ref 8.6–10.4)
Chloride: 108 mmol/L (ref 98–110)
Creat: 0.85 mg/dL (ref 0.60–0.93)
GFR, Est African American: 78 mL/min/{1.73_m2} (ref >=60)
GFR, Est Non African American: 67 mL/min/{1.73_m2} (ref >=60)
Glucose, Bld: 80 mg/dL (ref 65–99)
Potassium: 4.1 mmol/L (ref 3.5–5.3)
Sodium: 141 mmol/L (ref 135–146)

## 2017-04-14 LAB — CBC WITH DIFFERENTIAL/PLATELET
Basophils Absolute: 58 cells/uL (ref 0–200)
Basophils Relative: 1 %
Eosinophils Absolute: 273 cells/uL (ref 15–500)
Eosinophils Relative: 4.7 %
HCT: 35.5 % (ref 35.0–45.0)
Hemoglobin: 12.4 g/dL (ref 11.7–15.5)
Lymphs Abs: 1351 cells/uL (ref 850–3900)
MCH: 31.6 pg (ref 27.0–33.0)
MCHC: 34.9 g/dL (ref 32.0–36.0)
MCV: 90.6 fL (ref 80.0–100.0)
MPV: 9.6 fL (ref 7.5–12.5)
Monocytes Relative: 9.5 %
Neutro Abs: 3567 cells/uL (ref 1500–7800)
Neutrophils Relative %: 61.5 %
Platelets: 221 10*3/uL (ref 140–400)
RBC: 3.92 10*6/uL (ref 3.80–5.10)
RDW: 12 % (ref 11.0–15.0)
Total Lymphocyte: 23.3 %
WBC mixed population: 551 cells/uL (ref 200–950)
WBC: 5.8 10*3/uL (ref 3.8–10.8)

## 2017-04-14 LAB — HEMOGLOBIN A1C
Hgb A1c MFr Bld: 5.8 % of total Hgb — ABNORMAL HIGH (ref <5.7)
Mean Plasma Glucose: 120 (calc)
eAG (mmol/L): 6.6 (calc)

## 2017-04-14 LAB — LIPID PANEL
Cholesterol: 145 mg/dL (ref <200)
HDL: 57 mg/dL (ref >50)
LDL Cholesterol (Calc): 60 mg/dL (calc)
Non-HDL Cholesterol (Calc): 88 mg/dL (calc) (ref <130)
Total CHOL/HDL Ratio: 2.5 (calc) (ref <5.0)
Triglycerides: 215 mg/dL — ABNORMAL HIGH (ref <150)

## 2017-04-14 LAB — TSH: TSH: 3.36 mIU/L (ref 0.40–4.50)

## 2017-04-14 LAB — VITAMIN D 25 HYDROXY (VIT D DEFICIENCY, FRACTURES): Vit D, 25-Hydroxy: 70 ng/mL (ref 30–100)

## 2017-04-23 ENCOUNTER — Other Ambulatory Visit: Payer: Self-pay | Admitting: Physician Assistant

## 2017-04-25 ENCOUNTER — Ambulatory Visit: Payer: Self-pay | Admitting: Adult Health

## 2017-06-03 ENCOUNTER — Ambulatory Visit: Payer: Medicare Other | Admitting: Cardiovascular Disease

## 2017-06-03 ENCOUNTER — Encounter: Payer: Self-pay | Admitting: Cardiovascular Disease

## 2017-06-03 VITALS — BP 122/75 | HR 58 | Ht 65.0 in | Wt 143.6 lb

## 2017-06-03 DIAGNOSIS — I2510 Atherosclerotic heart disease of native coronary artery without angina pectoris: Secondary | ICD-10-CM | POA: Diagnosis not present

## 2017-06-03 DIAGNOSIS — I10 Essential (primary) hypertension: Secondary | ICD-10-CM | POA: Diagnosis not present

## 2017-06-03 DIAGNOSIS — Z951 Presence of aortocoronary bypass graft: Secondary | ICD-10-CM

## 2017-06-03 DIAGNOSIS — E785 Hyperlipidemia, unspecified: Secondary | ICD-10-CM

## 2017-06-03 DIAGNOSIS — I7300 Raynaud's syndrome without gangrene: Secondary | ICD-10-CM | POA: Diagnosis not present

## 2017-06-03 DIAGNOSIS — G4733 Obstructive sleep apnea (adult) (pediatric): Secondary | ICD-10-CM

## 2017-06-03 DIAGNOSIS — I341 Nonrheumatic mitral (valve) prolapse: Secondary | ICD-10-CM | POA: Diagnosis not present

## 2017-06-03 DIAGNOSIS — E782 Mixed hyperlipidemia: Secondary | ICD-10-CM | POA: Diagnosis not present

## 2017-06-03 MED ORDER — LABETALOL HCL 100 MG PO TABS: 150.0000 mg | ORAL_TABLET | Freq: Every day | ORAL | 1 refills | Status: DC

## 2017-06-03 NOTE — Patient Instructions (Signed)
Medication Instructions:  STOP Losartan  Follow-Up: Your physician wants you to follow-up in: 6 months with Dr. Tresa EndoKelly. You will receive a reminder letter in the mail two months in advance. If you don't receive a letter, please call our office to schedule the follow-up appointment.   Any Other Special Instructions Will Be Listed Below (If Applicable).     If you need a refill on your cardiac medications before your next appointment, please call your pharmacy.

## 2017-06-03 NOTE — Progress Notes (Signed)
Patient ID: Jasmine Reeves, female   DOB: Apr 04, 1940, 77 y.o.   MRN: [id number]     HPI: Jasmine Reeves is a 77 y.o. female presents for 53 month cardiology evaluation.  Jasmine Reeves underwent CABG revascularization surgery x4 on 03/02/2000 by Dr. Roxy Reeves with a LIMA placed her distal LAD, vein to the second diagonal, sequential vein to 2 marginal vessels arising from the circumflex coronary artery. Her RCA was free of disease and was not bypassed. Additional problems include mitral valve prolapse, Raynaud's disease, history of short bursts of nonsustained tachycardia on treadmill testing with reference to this on beta blocker therapy.,. She did develop edema remotely on amlodipine. She hasa history of documented APCs on Holter imaging which have improved with labetalol.  She underwent a nuclear perfusion study on 09/07/2013.  She was noted to have ST segment changes  on ECG but nuclear imaging showed apical thinning with breast attenuation, without ischemia.  She had normal wall motion.  She denies any awareness of palpitations.  She does have issues with some arthritic symptoms.  She takes diltiazem 180 mg in addition to losartan 25 mg, Normodyne 150 mg twice a day and HCTZ 25 mg.  She is on Vytorin 10/20 for hyperlipidemia.  She tells me she was referred for sleep study which was done in [place] after a home study was abnormal.  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 Epworth sleepiness scale score was increased at 16.  CPAP was recommended but this has not yet been initiated.  The patient was not aware that I also am involved in sleep medicine. I reviewed with her current Medicare guidelines.  Lab work by her primary physician in 2016 revealed fasting glucose of 110.  She had normal renal function.  LFTs were normal.  Lipid studies were excellent with a total cholesterol 145, triglycerides 57,  HDL 62, and LDL 72.  Hemoglobin A1c was minimally increased at 5.8.  TSH was 3.029.  When I last saw her in September 2018, she denied any chest pain.  She had experienced a presyncopal spell on a very hot day and her dose of Cardizem and losartan were reduced.  She denies any recurrent palpitations.   Her losartan and labetalol were reduced.  Presently, she is without chest pain.  She denies shortness of breath.  Her blood pressure has tended to be low.  She stopped taking hydrochlorothiazide and is now taking diltiazem 180 mg which is also helpful for her a ray nods phenomena and has only been taking labetalol 100 mg at bedtime and losartan 12.5 mg  in the morning.  She is on CPAP therapy for his obstructive sleep apnea which was diagnosed after a home sleep study in [place].  [employer] is her DME company.  1 month ago, repeat laboratory by Dr. Melford Reeves had shown a total cholesterol 145, HDL 57, triglycerides were increased at 215.  LDL was 6.0 she presents for reevaluation..   Past Medical History:  Diagnosis Date  . COPD (chronic obstructive pulmonary disease) ([place])   . Fibromyalgia   . GERD (gastroesophageal reflux disease)   . IBS (irritable bowel syndrome)   . Scoliosis     Past Surgical History:  Procedure Laterality Date  . ABDOMINAL HYSTERECTOMY    . APPENDECTOMY    . CORONARY ANGIOPLASTY  12/11  . CORONARY ARTERY BYPASS GRAFT    . ESOPHAGOGASTRODUODENOSCOPY  07/21/12   polyps, Hiatal hernia  Dr. Cristina Reeves  . TONSILLECTOMY    . TUBAL LIGATION      Allergies  Allergen Reactions  . Altace [Ramipril]     cough  . Azithromycin     palpitation  . Celebrex [Celecoxib]     Gi upset   . Hydrochlorothiazide     cramping  . 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 Prilosec [Omeprazole]     constipation    Current Outpatient Medications  Medication Sig Dispense Refill  . aspirin 81 MG tablet Take 81 mg by mouth daily.    . benzonatate (TESSALON  PERLES) 100 MG capsule Take 2 capsules (200 mg total) by mouth 3 (three) times daily as needed for cough (Max: 659m per day). 60 capsule 0  . CALCIUM CITRATE PO Take 500 mg by mouth daily.    . Cholecalciferol (VITAMIN D) 2000 UNITS tablet Take 2,000 Units by mouth 2 (two) times daily.    .Marland KitchenDILT-XR 180 MG 24 hr capsule TAKE 1 CAPSULE BY MOUTH DAILY 90 capsule 3  . fexofenadine (ALLEGRA) 180 MG tablet Take 180 mg by mouth daily.    . fluticasone (VERAMYST) 27.5 MCG/SPRAY nasal spray Place 2 sprays into the nose daily as needed for rhinitis. 10 g 8  . IRON PO Take 50 mg by mouth daily.    .Marland Kitchenlabetalol (NORMODYNE) 100 MG tablet Take 1.5 tablets (150 mg total) by mouth at bedtime. 135 tablet 1  . Magnesium 400 MG CAPS Take by mouth daily.    . meloxicam (MOBIC) 15 MG tablet Take 1/2 to 1 tablet daily on a full stomach for pain & inflammation 90 tablet 1  . Multiple Vitamins-Minerals (MULTIVITAMIN WITH MINERALS) tablet Take 1 tablet by mouth daily.    . nitroGLYCERIN (NITROSTAT) 0.4 MG SL tablet Place 1 tablet (0.4 mg total) under the tongue every 5 (five) minutes as needed for chest pain. 25 tablet 12  . pantoprazole (PROTONIX) 40 MG tablet TAKE 1 TABLET BY MOUTH TWICE DAILY 30 MINUTES BEFORE BREAKFAST AND DINNER 180 tablet 0  . promethazine-dextromethorphan (PROMETHAZINE-DM) 6.25-15 MG/5ML syrup Take 5 mLs by mouth 4 (four) times daily as needed for cough. 240 mL 1  . ranitidine (ZANTAC) 150 MG tablet Take 1 tablet (150 mg total) by mouth 2 (two) times daily. (Patient taking differently: Take 150 mg by mouth every morning. ) 180 tablet 1  . vitamin C (ASCORBIC ACID) 500 MG tablet Take 500 mg by mouth daily.    .Marland KitchenVYTORIN 10-20 MG tablet TAKE 1 TABLET BY MOUTH EVERY DAY 90 tablet 3   No current facility-administered medications for this visit.     Social History   Socioeconomic History  . Marital status: Married    Spouse name: Not on file  . Number of children: Not on file  . Years of  education: Not on file  . Highest education level: Not on file  Occupational History  . Not on file  Social Needs  . Financial resource strain: Not on file  . Food insecurity:    Worry: Not on file    Inability: Not on file  . Transportation needs:    Medical: Not on file    Non-medical: Not on file  Tobacco Use  . Smoking status: Former Smoker    Last attempt to quit: 09/03/1965    Years since quitting: 51.7  . Smokeless tobacco: Never Used  Substance and Sexual Activity  . Alcohol use: Yes  Alcohol/week: 1.8 oz    Types: 3 Standard drinks or equivalent per week    Comment: rare  . Drug use: No  . Sexual activity: Not on file  Lifestyle  . Physical activity:    Days per week: Not on file    Minutes per session: Not on file  . Stress: Not on file  Relationships  . Social connections:    Talks on phone: Not on file    Gets together: Not on file    Attends religious service: Not on file    Active member of club or organization: Not on file    Attends meetings of clubs or organizations: Not on file    Relationship status: Not on file  . Intimate partner violence:    Fear of current or ex partner: Not on file    Emotionally abused: Not on file    Physically abused: Not on file    Forced sexual activity: Not on file  Other Topics Concern  . Not on file  Social History Narrative  . Not on file   Socially she is married has 2 children 3 grandchildren. She does not routinely exercise. There is no tobacco use. She does drink occasional alcohol. She is retired Systems analyst.  Family History  Problem Relation Age of Onset  . Heart disease Brother   . Hyperlipidemia Brother    ROS General: Negative; No fevers, chills, or night sweats;  HEENT: Negative; No changes in vision or hearing, sinus congestion, difficulty swallowing Pulmonary: Negative; No cough, wheezing, shortness of breath, hemoptysis Cardiovascular: Negative; No chest pain, presyncope,  syncope, palpatations GI: Negative; No nausea, vomiting, diarrhea, or abdominal pain GU: Negative; No dysuria, hematuria, or difficulty voiding Musculoskeletal: Positive for a Raynaud's; no myalgias, joint pain, or weakness Hematologic/Oncology: Negative; no easy bruising, bleeding Endocrine: Negative; no heat/cold intolerance; no diabetes Neuro: Negative; no changes in balance, headaches Skin: Negative; No rashes or skin lesions Psychiatric: Negative; No behavioral problems, depression Sleep: Positive for obstructive sleep apnea on CPAP.  Other comprehensive 14 point system review is negative.  PE BP 122/75   Pulse (!) 58   Ht '5\' 5"'$  (1.651 m)   Wt 143 lb 9.6 oz (65.1 kg)   BMI 23.90 kg/m    Repeat blood pressure by me was 110/70 supine and 100/68 standing.  Wt Readings from Last 3 Encounters:  06/03/17 143 lb 9.6 oz (65.1 kg)  04/13/17 146 lb (66.2 kg)  03/10/17 145 lb (65.8 kg)   General: Alert, oriented, no distress.  Skin: normal turgor, no rashes, warm and dry HEENT: Normocephalic, atraumatic. Pupils equal round and reactive to light; sclera anicteric; extraocular muscles intact;  Nose without nasal septal hypertrophy Mouth/Parynx benign; Mallinpatti scale 2/3 Neck: No JVD, no carotid bruits; normal carotid upstroke Lungs: clear to ausculatation and percussion; no wheezing or rales Chest wall: without tenderness to palpitation Heart: PMI not displaced, RRR, s1 s2 normal, 1/6 systolic murmur, no diastolic murmur, no rubs, gallops, thrills, or heaves Abdomen: soft, nontender; no hepatosplenomehaly, BS+; abdominal aorta nontender and not dilated by palpation. Back: no CVA tenderness Pulses 2+ Musculoskeletal: full range of motion, normal strength, no joint deformities Extremities: no clubbing cyanosis or edema, Homan's sign negative  Neurologic: grossly nonfocal; Cranial nerves grossly wnl Psychologic: Normal mood and affect   ECG (independently read by me): Sinus  bradycardia 58 bpm.  No ectopy.  Normal intervals.  September 2018 ECG (independently read by me): Normal sinus rhythm at 62 bpm.  Nonspecific T-wave abnormality  September 2017 ECG (independently read by me): Sinus bradycardia 56 bpm with mild sinus arrhythmia.  Nonspecific T changes.  September 2016 ECG (independently read by me): Sinus bradycardia with sinus arrhythmia.  No significant ST-T changes.  August 2015 ECG: Sinus rhythm at 51 beats per minute. Non-specific T changes.  LABS: BMP Latest Ref Rng & Units 04/13/2017 01/07/2017 11/18/2016  Glucose 65 - 99 mg/dL 80 99 98  BUN 7 - 25 mg/dL _0 Creatinine 0.60 - 0.93 mg/dL 0.85 0.85 0.84  BUN/Creat Ratio 6 - 22 (calc) NOT APPLICABLE NOT APPLICABLE NOT APPLICABLE  Sodium 865 - 146 mmol/L 141 140 140  Potassium 3.5 - 5.3 mmol/L 4.1 3.9 3.8  Chloride 98 - 110 mmol/L 108 104 103  CO2 20 - 32 mmol/L _1 Calcium 8.6 - 10.4 mg/dL 9.0 9.4 9.4   Hepatic Function Latest Ref Rng & Units 04/13/2017 01/07/2017 10/04/2016  Total Protein 6.1 - 8.1 g/dL 5.8(L) 6.6 6.5  Albumin 3.6 - 5.1 g/dL - - 4.6  AST 10 - 35 U/L _2 ALT 6 - 29 U/L _3 Alk Phosphatase 33 - 130 U/L - - 60  Total Bilirubin 0.2 - 1.2 mg/dL 0.4 0.6 0.6  Bilirubin, Direct 0.0 - 0.2 mg/dL 0.1 0.1 0.1   CBC Latest Ref Rng & Units 04/13/2017 01/07/2017 11/18/2016  WBC 3.8 - 10.8 Thousand/uL 5.8 5.4 5.5  Hemoglobin 11.7 - 15.5 g/dL 12.4 13.3 12.5  Hematocrit 35.0 - 45.0 % 35.5 38.5 36.8  Platelets 140 - 400 Thousand/uL 221 200 193   Lab Results  Component Value Date   MCV 90.6 04/13/2017   MCV 90.6 01/07/2017   MCV 92.5 11/18/2016   Lab Results  Component Value Date   TSH 3.36 04/13/2017   Lab Results  Component Value Date   HGBA1C 5.8 (H) 04/13/2017   Lipid Panel     Component Value Date/Time   CHOL 145 04/13/2017 1623   TRIG 215 (H) 04/13/2017 1623   HDL 57 04/13/2017 1623   CHOLHDL 2.5 04/13/2017 1623   VLDL 22 10/04/2016 1114   LDLCALC 60  04/13/2017 1623    RADIOLOGY: No results found.  IMPRESSION:  1. CAD in native artery   2. Hx of CABG   3. Essential hypertension   4. OSA (obstructive sleep apnea)   5. MVP (mitral valve prolapse)   6. Raynaud's phenomenon without gangrene   7. Hyperlipidemia with target LDL less than 70   8. Mixed hyperlipidemia     ASSESSMENT AND PLAN: [name] is a 77 years old Caucasian female who underwent cardiac catheterization on New Year's Eve 2001.  Catheterization revealed severe two-vessel coronary artery disease and she underwent CABG revascularization surgery on 03/02/2000. Her last catheterization was in 2008 which showed patent grafts. She has normal LV function and mitral valve prolapse.   Her last  nuclear perfusion study in 2015 was unchanged and continued to show normal perfusion without scar or ischemia.  She did have exercise-induced ST changes.  She is not having ectopy on low-dose beta-blocker therapy.  She has a history of Raynaud's phenomenon which has been successfully treated with diltiazem.  Her blood pressure today is low and there is a 10 mm drop in blood pressure from supine to standing.  I have recommended she discontinue her very low-dose losartan.  She will continue taking diltiazem 180 mg in the morning and labetalol 100 mg at  bedtime.  In the past her dose had been reduced from twice a day.  Recent lipid studies show an LDL cholesterol at 60.  She is on Vytorin 10/20 mg.  Triglycerides had increased and further reduction in sweets and carbohydrates and sugars was recommended.  [employer] is her DME company for obstructive sleep apnea.  We will try to link her to our database.  She is not having any anginal symptoms.  I reviewed complete blood work by Dr. Melford Reeves.  TSH was 3.36.  She continues to be on ranitidine for GERD.  Time spent: 25 minutes Troy Sine, MD, [facility]  06/05/2017 10:44 AM

## 2017-06-05 ENCOUNTER — Encounter: Payer: Self-pay | Admitting: Cardiovascular Disease

## 2017-06-09 NOTE — Addendum Note (Signed)
Addended by: Barrie DunkerHOMAS, Cecilio Ohlrich N on: 06/09/2017 07:17 AM   Modules accepted: Orders

## 2017-06-20 ENCOUNTER — Other Ambulatory Visit: Payer: Self-pay | Admitting: Internal Medicine

## 2017-06-20 DIAGNOSIS — M779 Enthesopathy, unspecified: Principal | ICD-10-CM

## 2017-06-20 DIAGNOSIS — M778 Other enthesopathies, not elsewhere classified: Secondary | ICD-10-CM

## 2017-07-14 ENCOUNTER — Ambulatory Visit: Payer: Medicare Other | Admitting: Internal Medicine

## 2017-07-14 VITALS — BP 104/62 | HR 60 | Temp 97.1°F | Resp 16 | Ht 63.5 in | Wt 138.0 lb

## 2017-07-14 DIAGNOSIS — R7303 Prediabetes: Secondary | ICD-10-CM

## 2017-07-14 DIAGNOSIS — Z0000 Encounter for general adult medical examination without abnormal findings: Secondary | ICD-10-CM | POA: Diagnosis not present

## 2017-07-14 DIAGNOSIS — Z1212 Encounter for screening for malignant neoplasm of rectum: Secondary | ICD-10-CM

## 2017-07-14 DIAGNOSIS — Z9989 Dependence on other enabling machines and devices: Secondary | ICD-10-CM

## 2017-07-14 DIAGNOSIS — E782 Mixed hyperlipidemia: Secondary | ICD-10-CM

## 2017-07-14 DIAGNOSIS — Z0001 Encounter for general adult medical examination with abnormal findings: Secondary | ICD-10-CM

## 2017-07-14 DIAGNOSIS — I25118 Atherosclerotic heart disease of native coronary artery with other forms of angina pectoris: Secondary | ICD-10-CM

## 2017-07-14 DIAGNOSIS — Z79899 Other long term (current) drug therapy: Secondary | ICD-10-CM

## 2017-07-14 DIAGNOSIS — G4733 Obstructive sleep apnea (adult) (pediatric): Secondary | ICD-10-CM

## 2017-07-14 DIAGNOSIS — E559 Vitamin D deficiency, unspecified: Secondary | ICD-10-CM

## 2017-07-14 DIAGNOSIS — R7309 Other abnormal glucose: Secondary | ICD-10-CM

## 2017-07-14 DIAGNOSIS — I10 Essential (primary) hypertension: Secondary | ICD-10-CM

## 2017-07-14 DIAGNOSIS — Z1211 Encounter for screening for malignant neoplasm of colon: Secondary | ICD-10-CM

## 2017-07-14 NOTE — Progress Notes (Signed)
[facility] Jasmine Reeves, M.D.     Dyanne Carrel. Steffanie Dunn, P.A.-C Judd Gaudier, DNP [facility] [address] [place], [place]. [address] Telephone [phone] Telefax [phone] Annual Screening/Preventative Visit & Comprehensive Evaluation &  Examination     This very nice 77 y.o.  MWF presents for a Screening/Preventative Visit & comprehensive evaluation and management of multiple medical co-morbidities.  Patient has been followed for HTN, ASCAD s/p CABG,  HLD, Prediabetes  and Vitamin D Deficiency. Patient also has OSA /CPAP with improved sleep hygiene.       HTN predates since 1998. Patient's BP has been controlled at home. Patient has ADCAD and had PCA/Stenting x 2 in Nov & Dec 2001 and ultimately underwent CABG in Jan 2002. Her last Myoview in 2015 was Negative.  patient denies any cardiac symptoms as chest pain, palpitations, shortness of breath, dizziness or ankle swelling. Today's BP is at goal - 104/62.      Patient's hyperlipidemia is controlled with diet and medications. Patient denies myalgias or other medication SE's. Today's lipids are at goal albeit elevated Trig's: Lab Results  Component Value Date   CHOL 134 07/14/2017   HDL 62 07/14/2017   LDLCALC 58 07/14/2017   TRIG 62 07/14/2017   CHOLHDL 2.2 07/14/2017      Patient has prediabetes predating (A1c 6.3%/2012 and 5.8%/Feb.2019)  and patient denies reactive hypoglycemic symptoms, visual blurring, diabetic polys, or paresthesias. Today's A1c is at goal: Lab Results  Component Value Date   HGBA1C 5.6 07/14/2017      Finally, patient has history of Vitamin D Deficiency and today's Vitamin D is at goal: Lab Results  Component Value Date   VD25OH 98 07/14/2017   Current Outpatient Medications on File Prior to Visit  Medication Sig  . aspirin 81 MG tablet Take 81 mg by mouth daily.  Marland Kitchen CALCIUM CITRATE PO Take 500 mg by mouth daily.  .  Cholecalciferol (VITAMIN D) 2000 UNITS tablet Take 2,000 Units by mouth 2 (two) times daily.  Marland Kitchen DILT-XR 180 MG 24 hr capsule TAKE 1 CAPSULE BY MOUTH DAILY  . fexofenadine (ALLEGRA) 180 MG tablet Take 180 mg by mouth daily.  . fluticasone (FLONASE) 50 MCG/ACT nasal spray SHAKE LIQUID AND USE 2 SPRAYS IN EACH NOSTRIL TWICE DAILY  . fluticasone (VERAMYST) 27.5 MCG/SPRAY nasal spray Place 2 sprays into the nose daily as needed for rhinitis.  . IRON PO Take 50 mg by mouth daily.  Marland Kitchen labetalol (NORMODYNE) 100 MG tablet Take 1.5 tablets (150 mg total) by mouth at bedtime. (Patient taking differently: Take 100 mg by mouth at bedtime. )  . Magnesium 400 MG CAPS Take by mouth daily.  . meloxicam (MOBIC) 15 MG tablet TAKE 1/2 TO 1 TABLET BY MOUTH DAILY ON A FULL STOMACH FOR PAIN AND INFLAMMATION  . Multiple Vitamins-Minerals (MULTIVITAMIN WITH MINERALS) tablet Take 1 tablet by mouth daily.  . nitroGLYCERIN (NITROSTAT) 0.4 MG SL tablet Place 1 tablet (0.4 mg total) under the tongue every 5 (five) minutes as needed for chest pain.  . pantoprazole (PROTONIX) 40 MG tablet TAKE 1 TABLET BY MOUTH TWICE DAILY 30 MINUTES BEFORE BREAKFAST AND DINNER  . Probiotic Product (PROBIOTIC-10 PO) Take 1 capsule by mouth.  . ranitidine (ZANTAC) 150 MG tablet Take 1 tablet (150 mg total) by mouth 2 (two) times daily. (Patient taking differently: Take 150 mg by mouth every morning. )  . vitamin C (ASCORBIC ACID) 500 MG tablet Take 500 mg  by mouth daily.  Marland Kitchen VYTORIN 10-20 MG tablet TAKE 1 TABLET BY MOUTH EVERY DAY   No current facility-administered medications on file prior to visit.    Allergies  Allergen Reactions  . Altace [Ramipril]     cough  . Azithromycin     palpitation  . Celebrex [Celecoxib]     Gi upset   . Hydrochlorothiazide     cramping  . 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 Prilosec [Omeprazole]     constipation   Past Medical History:  Diagnosis Date  . COPD  (chronic obstructive pulmonary disease) (HCC)   . Fibromyalgia   . GERD (gastroesophageal reflux disease)   . IBS (irritable bowel syndrome)   . Scoliosis    Health Maintenance  Topic Date Due  . INFLUENZA VACCINE  09/29/2017  . MAMMOGRAM  08/18/2018  . TETANUS/TDAP  03/27/2022  . COLONOSCOPY  01/02/2025  . DEXA SCAN  Completed  . PNA vac Low Risk Adult  Completed   Immunization History  Administered Date(s) Administered  . Influenza, High Dose Seasonal PF 11/11/2014, 12/08/2015  . Influenza,inj,Quad PF,6+ Mos 11/27/2016  . Pneumococcal Conjugate-13 04/16/2014  . Pneumococcal-Unspecified 03/01/1998, 03/12/2010  . Td 03/27/2012  . Zoster 03/02/2007   Last Colon - 11.4.2016 - Dr Matthias Hughs recc 3 yr f/u 12/2017  Last MGM - 08/17/2016 and scheduled for 07/2017   Past Surgical History:  Procedure Laterality Date  . ABDOMINAL HYSTERECTOMY    . APPENDECTOMY    . CORONARY ANGIOPLASTY  12/11  . CORONARY ARTERY BYPASS GRAFT    . ESOPHAGOGASTRODUODENOSCOPY  07/21/12   polyps, Hiatal hernia Dr. Matthias Hughs  . TONSILLECTOMY    . TUBAL LIGATION     Family History  Problem Relation Age of Onset  . Heart disease Brother   . Hyperlipidemia Brother    Social History   Tobacco Use  . Smoking status: Former Smoker    Last attempt to quit: 09/03/1965    Years since quitting: 51.9  . Smokeless tobacco: Never Used  Substance Use Topics  . Alcohol use: Yes    Alcohol/week: 1.8 oz    Types: 3 Standard drinks or equivalent per week    Comment: rare  . Drug use: No    ROS Constitutional: Denies fever, chills, weight loss/gain, headaches, insomnia,  night sweats, and change in appetite. Does c/o fatigue. Eyes: Denies redness, blurred vision, diplopia, discharge, itchy, watery eyes.  ENT: Denies discharge, congestion, post nasal drip, epistaxis, sore throat, earache, hearing loss, dental pain, Tinnitus, Vertigo, Sinus pain, snoring.  Cardio: Denies chest pain, palpitations, irregular heartbeat,  syncope, dyspnea, diaphoresis, orthopnea, PND, claudication, edema Respiratory: denies cough, dyspnea, DOE, pleurisy, hoarseness, laryngitis, wheezing.  Gastrointestinal: Denies dysphagia, heartburn, reflux, water brash, pain, cramps, nausea, vomiting, bloating, diarrhea, constipation, hematemesis, melena, hematochezia, jaundice, hemorrhoids Genitourinary: Denies dysuria, frequency, urgency, nocturia, hesitancy, discharge, hematuria, flank pain Breast: Breast lumps, nipple discharge, bleeding.  Musculoskeletal: Denies arthralgia, myalgia, stiffness, Jt. Swelling, pain, limp, and strain/sprain. Denies falls. Skin: Denies puritis, rash, hives, warts, acne, eczema, changing in skin lesion Neuro: No weakness, tremor, incoordination, spasms, paresthesia, pain Psychiatric: Denies confusion, memory loss, sensory loss. Denies Depression. Endocrine: Denies change in weight, skin, hair change, nocturia, and paresthesia, diabetic polys, visual blurring, hyper / hypo glycemic episodes.  Heme/Lymph: No excessive bleeding, bruising, enlarged lymph nodes.  Physical Exam  BP 104/62   Pulse 60   Temp (!) 97.1 F (36.2 C)   Resp 16  Ht 5' 3.5" (1.613 m)   Wt 138 lb (62.6 kg)   BMI 24.06 kg/m   General Appearance: Well nourished, well groomed and in no apparent distress.  Eyes: PERRLA, EOMs, conjunctiva no swelling or erythema, normal fundi and vessels. Sinuses: No frontal/maxillary tenderness ENT/Mouth: EACs patent / TMs  nl. Nares clear without erythema, swelling, mucoid exudates. Oral hygiene is good. No erythema, swelling, or exudate. Tongue normal, non-obstructing. Tonsils not swollen or erythematous. Hearing normal.  Neck: Supple, thyroid not palpable. No bruits, nodes or JVD. Respiratory: Respiratory effort normal.  BS equal and clear bilateral without rales, rhonci, wheezing or stridor. Cardio: Heart sounds are normal with regular rate and rhythm and no murmurs, rubs or gallops. Peripheral  pulses are normal and equal bilaterally without edema. No aortic or femoral bruits. Chest: symmetric with normal excursions and percussion. Breasts: Symmetric, without lumps, nipple discharge, retractions, or fibrocystic changes.  Abdomen: Flat, soft with bowel sounds active. Nontender, no guarding, rebound, hernias, masses, or organomegaly.  Lymphatics: Non tender without lymphadenopathy.  Genitourinary:  Musculoskeletal: Full ROM all peripheral extremities, joint stability, 5/5 strength, and normal gait. Skin: Warm and dry without rashes, lesions, cyanosis, clubbing or  ecchymosis.  Neuro: Cranial nerves intact, reflexes equal bilaterally. Normal muscle tone, no cerebellar symptoms. Sensation intact.  Pysch: Alert and oriented X 3, normal affect, Insight and Judgment appropriate.   Assessment and Plan  1. Annual Preventative Screening Examination  2. Essential hypertension  - Urinalysis, Routine w reflex microscopic - Microalbumin / creatinine urine ratio - CBC with Differential/Platelet - COMPLETE METABOLIC PANEL WITH GFR - Magnesium - TSH  3. Hyperlipidemia, mixed  - Lipid panel - TSH  4. Abnormal glucose  - Hemoglobin A1c - Insulin, random  5. Vitamin D deficiency  - VITAMIN D 25 Hydroxy  6. Prediabetes  - Hemoglobin A1c - Insulin, random  7. 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)  - Lipid panel  8. OSA on CPAP   9. Screening for colorectal cancer  10. Medication management  - Urinalysis, Routine w reflex microscopic - Microalbumin / creatinine urine ratio - CBC with Differential/Platelet - COMPLETE METABOLIC PANEL WITH GFR - Magnesium - Lipid panel - TSH - Hemoglobin A1c - Insulin, random - VITAMIN D 25 Hydroxyl          Patient was counseled in prudent diet to achieve/maintain BMI less than 25 for weight control, BP monitoring, regular exercise and medications. Discussed  med's effects and SE's. Screening labs and tests as requested with regular follow-up as recommended. Over 40 minutes of exam, counseling, chart review and high complex critical decision making was performed.

## 2017-07-14 NOTE — Patient Instructions (Signed)
We Do NOT Approve of  [facility] [place] Soliciting Our Patients  To Do Home Visits & We Do NOT Approve of [facility] > > > > > > > > > > > > > > > > > > > > > > > > > > > > > > > > > > > > > > >  Preventive Care for Adults  A healthy lifestyle and preventive care can promote health and wellness. Preventive health guidelines for women include the following key practices.  A routine yearly physical is a good way to check with your health care provider about your health and preventive screening. It is a chance to share any concerns and updates on your health and to receive a thorough exam.  Visit your dentist for a routine exam and preventive care every 6 months. Brush your teeth twice a day and floss once a day. Good oral hygiene prevents tooth decay and gum disease.  The frequency of eye exams is based on your age, health, family medical history, use of contact lenses, and other factors. Follow your health care provider's recommendations for frequency of eye exams.  Eat a healthy diet. Foods like vegetables, fruits, whole grains, low-fat dairy products, and lean protein foods contain the nutrients you need without too many calories. Decrease your intake of foods high in solid fats, added sugars, and salt. Eat the right amount of calories for you. Get information about a proper diet from your health care provider, if necessary.  Regular physical exercise is one of the most important things you can do for your health. Most adults should get at least 150 minutes of moderate-intensity exercise (any activity that increases your heart rate and causes you to sweat) each week. In addition, most adults need muscle-strengthening exercises on 2 or more days a week.  Maintain a healthy weight. The body mass index (BMI) is a screening tool to identify possible weight problems. It provides an estimate of body fat based on height and weight. Your health care provider can find your BMI  and can help you achieve or maintain a healthy weight. For adults 20 years and older:  A BMI below 18.5 is considered underweight.  A BMI of 18.5 to 24.9 is normal.  A BMI of 25 to 29.9 is considered overweight.  A BMI of 30 and above is considered obese.  Maintain normal blood lipids and cholesterol levels by exercising and minimizing your intake of saturated fat. Eat a balanced diet with plenty of fruit and vegetables. If your lipid or cholesterol levels are high, you are over 50, or you are at high risk for heart disease, you may need your cholesterol levels checked more frequently. Ongoing high lipid and cholesterol levels should be treated with medicines if diet and exercise are not working.  If you smoke, find out from your health care provider how to quit. If you do not use tobacco, do not start.  Lung cancer screening is recommended for adults aged 55-80 years who are at high risk for developing lung cancer because of a history of smoking. A yearly low-dose CT scan of the lungs is recommended for people who have at least a 30-pack-year history of smoking and are a current smoker or have quit within the past 15 years. A pack year of smoking is smoking an average of 1 pack of cigarettes a day for 1 year (for example: 1 pack a day for 30 years or 2 packs a day   for 15 years). Yearly screening should continue until the smoker has stopped smoking for at least 15 years. Yearly screening should be stopped for people who develop a health problem that would prevent them from having lung cancer treatment.  Avoid use of street drugs. Do not share needles with anyone. Ask for help if you need support or instructions about stopping the use of drugs.  High blood pressure causes heart disease and increases the risk of stroke.  Ongoing high blood pressure should be treated with medicines if weight loss and exercise do not work.  If you are 29-34 years old, ask your health care provider if you should take  aspirin to prevent strokes.  Diabetes screening involves taking a blood sample to check your fasting blood sugar level. This should be done once every 3 years, after age 27, if you are within normal weight and without risk factors for diabetes. Testing should be considered at a younger age or be carried out more frequently if you are overweight and have at least 1 risk factor for diabetes.  Breast cancer screening is essential preventive care for women. You should practice "breast self-awareness." This means understanding the normal appearance and feel of your breasts and may include breast self-examination. Any changes detected, no matter how small, should be reported to a health care provider. Women in their 64s and 30s should have a clinical breast exam (CBE) by a health care provider as part of a regular health exam every 1 to 3 years. After age 69, women should have a CBE every year. Starting at age 6, women should consider having a mammogram (breast X-ray test) every year. Women who have a family history of breast cancer should talk to their health care provider about genetic screening. Women at a high risk of breast cancer should talk to their health care providers about having an MRI and a mammogram every year.  Breast cancer gene (BRCA)-related cancer risk assessment is recommended for women who have family members with BRCA-related cancers. BRCA-related cancers include breast, ovarian, tubal, and peritoneal cancers. Having family members with these cancers may be associated with an increased risk for harmful changes (mutations) in the breast cancer genes BRCA1 and BRCA2. Results of the assessment will determine the need for genetic counseling and BRCA1 and BRCA2 testing.  Routine pelvic exams to screen for cancer are no longer recommended for nonpregnant women who are considered low risk for cancer of the pelvic organs (ovaries, uterus, and vagina) and who do not have symptoms. Ask your health  care provider if a screening pelvic exam is right for you.  If you have had past treatment for cervical cancer or a condition that could lead to cancer, you need Pap tests and screening for cancer for at least 20 years after your treatment. If Pap tests have been discontinued, your risk factors (such as having a new sexual partner) need to be reassessed to determine if screening should be resumed. Some women have medical problems that increase the chance of getting cervical cancer. In these cases, your health care provider may recommend more frequent screening and Pap tests.    Colorectal cancer can be detected and often prevented. Most routine colorectal cancer screening begins at the age of 21 years and continues through age 8 years. However, your health care provider may recommend screening at an earlier age if you have risk factors for colon cancer. On a yearly basis, your health care provider may provide home test kits to check  for hidden blood in the stool. Use of a small camera at the end of a tube, to directly examine the colon (sigmoidoscopy or colonoscopy), can detect the earliest forms of colorectal cancer. Talk to your health care provider about this at age 50, when routine screening begins.  Direct exam of the colon should be repeated every 5-10 years through age 75 years, unless early forms of pre-cancerous polyps or small growths are found.  Osteoporosis is a disease in which the bones lose minerals and strength with aging. This can result in serious bone fractures or breaks. The risk of osteoporosis can be identified using a bone density scan. Women ages 65 years and over and women at risk for fractures or osteoporosis should discuss screening with their health care providers. Ask your health care provider whether you should take a calcium supplement or vitamin D to reduce the rate of osteoporosis.  Menopause can be associated with physical symptoms and risks. Hormone replacement therapy  is available to decrease symptoms and risks. You should talk to your health care provider about whether hormone replacement therapy is right for you.  Use sunscreen. Apply sunscreen liberally and repeatedly throughout the day. You should seek shade when your shadow is shorter than you. Protect yourself by wearing long sleeves, pants, a wide-brimmed hat, and sunglasses year round, whenever you are outdoors.  Once a month, do a whole body skin exam, using a mirror to look at the skin on your back. Tell your health care provider of new moles, moles that have irregular borders, moles that are larger than a pencil eraser, or moles that have changed in shape or color.  Stay current with required vaccines (immunizations).  Influenza vaccine. All adults should be immunized every year.  Tetanus, diphtheria, and acellular pertussis (Td, Tdap) vaccine. Pregnant women should receive 1 dose of Tdap vaccine during each pregnancy. The dose should be obtained regardless of the length of time since the last dose. Immunization is preferred during the 27th-36th week of gestation. An adult who has not previously received Tdap or who does not know her vaccine status should receive 1 dose of Tdap. This initial dose should be followed by tetanus and diphtheria toxoids (Td) booster doses every 10 years. Adults with an unknown or incomplete history of completing a 3-dose immunization series with Td-containing vaccines should begin or complete a primary immunization series including a Tdap dose. Adults should receive a Td booster every 10 years.    Zoster vaccine. One dose is recommended for adults aged 60 years or older unless certain conditions are present.    Pneumococcal 13-valent conjugate (PCV13) vaccine. When indicated, a person who is uncertain of her immunization history and has no record of immunization should receive the PCV13 vaccine. An adult aged 19 years or older who has certain medical conditions and has not  been previously immunized should receive 1 dose of PCV13 vaccine. This PCV13 should be followed with a dose of pneumococcal polysaccharide (PPSV23) vaccine. The PPSV23 vaccine dose should be obtained at least 1 or more year(s) after the dose of PCV13 vaccine. An adult aged 19 years or older who has certain medical conditions and previously received 1 or more doses of PPSV23 vaccine should receive 1 dose of PCV13. The PCV13 vaccine dose should be obtained 1 or more years after the last PPSV23 vaccine dose.    Pneumococcal polysaccharide (PPSV23) vaccine. When PCV13 is also indicated, PCV13 should be obtained first. All adults aged 65 years and older should   be immunized. An adult younger than age 65 years who has certain medical conditions should be immunized. Any person who resides in a nursing home or long-term care facility should be immunized. An adult smoker should be immunized. People with an immunocompromised condition and certain other conditions should receive both PCV13 and PPSV23 vaccines. People with human immunodeficiency virus (HIV) infection should be immunized as soon as possible after diagnosis. Immunization during chemotherapy or radiation therapy should be avoided. Routine use of PPSV23 vaccine is not recommended for American Indians, [place] Natives, or people younger than 65 years unless there are medical conditions that require PPSV23 vaccine. When indicated, people who have unknown immunization and have no record of immunization should receive PPSV23 vaccine. One-time revaccination 5 years after the first dose of PPSV23 is recommended for people aged 19-64 years who have chronic kidney failure, nephrotic syndrome, asplenia, or immunocompromised conditions. People who received 1-2 doses of PPSV23 before age 65 years should receive another dose of PPSV23 vaccine at age 65 years or later if at least 5 years have passed since the previous dose. Doses of PPSV23 are not needed for people immunized  with PPSV23 at or after age 65 years.   Preventive Services / Frequency  Ages 65 years and over  Blood pressure check.  Lipid and cholesterol check.  Lung cancer screening. / Every year if you are aged 55-80 years and have a 30-pack-year history of smoking and currently smoke or have quit within the past 15 years. Yearly screening is stopped once you have quit smoking for at least 15 years or develop a health problem that would prevent you from having lung cancer treatment.  Clinical breast exam.** / Every year after age 40 years.   BRCA-related cancer risk assessment.** / For women who have family members with a BRCA-related cancer (breast, ovarian, tubal, or peritoneal cancers).  Mammogram.** / Every year beginning at age 40 years and continuing for as long as you are in good health. Consult with your health care provider.  Pap test.** / Every 3 years starting at age 30 years through age 65 or 70 years with 3 consecutive normal Pap tests. Testing can be stopped between 65 and 70 years with 3 consecutive normal Pap tests and no abnormal Pap or HPV tests in the past 10 years.  Fecal occult blood test (FOBT) of stool. / Every year beginning at age 50 years and continuing until age 75 years. You may not need to do this test if you get a colonoscopy every 10 years.  Flexible sigmoidoscopy or colonoscopy.** / Every 5 years for a flexible sigmoidoscopy or every 10 years for a colonoscopy beginning at age 50 years and continuing until age 75 years.  Hepatitis C blood test.** / For all people born from 1945 through 1965 and any individual with known risks for hepatitis C.  Osteoporosis screening.** / A one-time screening for women ages 65 years and over and women at risk for fractures or osteoporosis.  Skin self-exam. / Monthly.  Influenza vaccine. / Every year.  Tetanus, diphtheria, and acellular pertussis (Tdap/Td) vaccine.** / 1 dose of Td every 10 years.  Zoster vaccine.** / 1 dose  for adults aged 60 years or older.  Pneumococcal 13-valent conjugate (PCV13) vaccine.** / Consult your health care provider.  Pneumococcal polysaccharide (PPSV23) vaccine.** / 1 dose for all adults aged 65 years and older. Screening for abdominal aortic aneurysm (AAA)  by ultrasound is recommended for people who have history of high blood pressure   or who are current or former smokers. ++++++++++++++++++++ Recommend Adult Low Dose Aspirin or  coated  Aspirin 81 mg daily  To reduce risk of Colon Cancer 20 %,  Skin Cancer 26 % ,  Melanoma 46%  and  Pancreatic cancer 60% ++++++++++++++++++++ Vitamin D goal  is between 70-100.  Please make sure that you are taking your Vitamin D as directed.  It is very important as a natural anti-inflammatory  helping hair, skin, and nails, as well as reducing stroke and heart attack risk.  It helps your bones and helps with mood. It also decreases numerous cancer risks so please take it as directed.  Low Vit D is associated with a 200-300% higher risk for CANCER  and 200-300% higher risk for HEART   ATTACK  &  STROKE.   .....................................Marland Kitchen It is also associated with higher death rate at younger ages,  autoimmune diseases like Rheumatoid arthritis, Lupus, Multiple Sclerosis.    Also many other serious conditions, like depression, Alzheimer's Dementia, infertility, muscle aches, fatigue, fibromyalgia - just to name a few. ++++++++++++++++++ Recommend the book "The END of DIETING" by Dr Excell Seltzer  & the book "The END of DIABETES " by Dr Excell Seltzer At [email or web address] - get book & Audio CD's    Being diabetic has a  300% increased risk for heart attack, stroke, cancer, and alzheimer- type vascular dementia. It is very important that you work harder with diet by avoiding all foods that are white. Avoid white rice (brown & wild rice is OK), white potatoes (sweetpotatoes in moderation is OK), White bread or wheat bread or anything made out of  white flour like bagels, donuts, rolls, buns, biscuits, cakes, pastries, cookies, pizza crust, and pasta (made from white flour & egg whites) - vegetarian pasta or spinach or wheat pasta is OK. Multigrain breads like Arnold's or Pepperidge [employer], or multigrain sandwich thins or flatbreads.  Diet, exercise and weight loss can reverse and cure diabetes in the early stages.  Diet, exercise and weight loss is very important in the control and prevention of complications of diabetes which affects every system in your body, ie. Brain - dementia/stroke, eyes - glaucoma/blindness, heart - heart attack/heart failure, kidneys - dialysis, stomach - gastric paralysis, intestines - malabsorption, nerves - severe painful neuritis, circulation - gangrene & loss of a leg(s), and finally cancer and Alzheimers.    I recommend avoid fried & greasy foods,  sweets/candy, white rice (brown or wild rice or Quinoa is OK), white potatoes (sweet potatoes are OK) - anything made from white flour - bagels, doughnuts, rolls, buns, biscuits,white and wheat breads, pizza crust and traditional pasta made of white flour & egg white(vegetarian pasta or spinach or wheat pasta is OK).  Multi-grain bread is OK - like multi-grain flat bread or sandwich thins. Avoid alcohol in excess. Exercise is also important.    Eat all the vegetables you want - avoid meat, especially red meat and dairy - especially cheese.  Cheese is the most concentrated form of trans-fats which is the worst thing to clog up our arteries. Veggie cheese is OK which can be found in the fresh produce section at [employer] or [employer] or [employer]  +++++++++++++++++++ DASH Eating Plan  DASH stands for "Dietary Approaches to Stop Hypertension."   The DASH eating plan is a healthy eating plan that has been shown to reduce high blood pressure (hypertension). Additional health benefits may include reducing the risk of type 2 diabetes mellitus, heart disease,  and stroke. The  DASH eating plan may also help with weight loss. WHAT DO I NEED TO KNOW ABOUT THE DASH EATING PLAN? For the DASH eating plan, you will follow these general guidelines:  Choose foods with a percent daily value for sodium of less than 5% (as listed on the food label).  Use salt-free seasonings or herbs instead of table salt or sea salt.  Check with your health care provider or pharmacist before using salt substitutes.  Eat lower-sodium products, often labeled as "lower sodium" or "no salt added."  Eat fresh foods.  Eat more vegetables, fruits, and low-fat dairy products.  Choose whole grains. Look for the word "whole" as the first word in the ingredient list.  Choose fish   Limit sweets, desserts, sugars, and sugary drinks.  Choose heart-healthy fats.  Eat veggie cheese   Eat more home-cooked food and less restaurant, buffet, and fast food.  Limit fried foods.  Cook foods using methods other than frying.  Limit canned vegetables. If you do use them, rinse them well to decrease the sodium.  When eating at a restaurant, ask that your food be prepared with less salt, or no salt if possible.                      WHAT FOODS CAN I EAT? Read Dr Fara Olden Fuhrman's books on The End of Dieting & The End of Diabetes  Grains Whole grain or whole wheat bread. Brown rice. Whole grain or whole wheat pasta. Quinoa, bulgur, and whole grain cereals. Low-sodium cereals. Corn or whole wheat flour tortillas. Whole grain cornbread. Whole grain crackers. Low-sodium crackers.  Vegetables Fresh or frozen vegetables (raw, steamed, roasted, or grilled). Low-sodium or reduced-sodium tomato and vegetable juices. Low-sodium or reduced-sodium tomato sauce and paste. Low-sodium or reduced-sodium canned vegetables.   Fruits All fresh, canned (in natural juice), or frozen fruits.  Protein Products  All fish and seafood.  Dried beans, peas, or lentils. Unsalted nuts and seeds. Unsalted canned  beans.  Dairy Low-fat dairy products, such as skim or 1% milk, 2% or reduced-fat cheeses, low-fat ricotta or cottage cheese, or plain low-fat yogurt. Low-sodium or reduced-sodium cheeses.  Fats and Oils Tub margarines without trans fats. Light or reduced-fat mayonnaise and salad dressings (reduced sodium). Avocado. Safflower, olive, or canola oils. Natural peanut or almond butter.  Other Unsalted popcorn and pretzels. The items listed above may not be a complete list of recommended foods or beverages. Contact your dietitian for more options.  +++++++++++++++  WHAT FOODS ARE NOT RECOMMENDED? Grains/ White flour or wheat flour White bread. White pasta. White rice. Refined cornbread. Bagels and croissants. Crackers that contain trans fat.  Vegetables  Creamed or fried vegetables. Vegetables in a . Regular canned vegetables. Regular canned tomato sauce and paste. Regular tomato and vegetable juices.  Fruits Dried fruits. Canned fruit in light or heavy syrup. Fruit juice.  Meat and Other Protein Products Meat in general - RED meat & White meat.  Fatty cuts of meat. Ribs, chicken wings, all processed meats as bacon, sausage, bologna, salami, fatback, hot dogs, bratwurst and packaged luncheon meats.  Dairy Whole or 2% milk, cream, half-and-half, and cream cheese. Whole-fat or sweetened yogurt. Full-fat cheeses or blue cheese. Non-dairy creamers and whipped toppings. Processed cheese, cheese spreads, or cheese curds.  Condiments Onion and garlic salt, seasoned salt, table salt, and sea salt. Canned and packaged gravies. Worcestershire sauce. Tartar sauce. Barbecue sauce. Teriyaki sauce. Soy sauce, including reduced  sodium. Steak sauce. Fish sauce. Oyster sauce. Cocktail sauce. Horseradish. Ketchup and mustard. Meat flavorings and tenderizers. Bouillon cubes. Hot sauce. Tabasco sauce. Marinades. Taco seasonings. Relishes.  Fats and Oils Butter, stick margarine, lard, shortening and bacon  fat. Coconut, palm kernel, or palm oils. Regular salad dressings.  Pickles and olives. Salted popcorn and pretzels.  The items listed above may not be a complete list of foods and beverages to avoid.

## 2017-07-15 LAB — COMPLETE METABOLIC PANEL WITH GFR
AG Ratio: 2.3 (calc) (ref 1.0–2.5)
ALT: 21 U/L (ref 6–29)
AST: 25 U/L (ref 10–35)
Albumin: 4.4 g/dL (ref 3.6–5.1)
Alkaline phosphatase (APISO): 73 U/L (ref 33–130)
BUN: 23 mg/dL (ref 7–25)
CO2: 28 mmol/L (ref 20–32)
Calcium: 9.1 mg/dL (ref 8.6–10.4)
Chloride: 106 mmol/L (ref 98–110)
Creat: 0.66 mg/dL (ref 0.60–0.93)
GFR, Est African American: 101 mL/min/{1.73_m2} (ref >=60)
GFR, Est Non African American: 87 mL/min/{1.73_m2} (ref >=60)
Globulin: 1.9 g/dL (calc) (ref 1.9–3.7)
Glucose, Bld: 104 mg/dL — ABNORMAL HIGH (ref 65–99)
Potassium: 4 mmol/L (ref 3.5–5.3)
Sodium: 139 mmol/L (ref 135–146)
Total Bilirubin: 0.6 mg/dL (ref 0.2–1.2)
Total Protein: 6.3 g/dL (ref 6.1–8.1)

## 2017-07-15 LAB — CBC WITH DIFFERENTIAL/PLATELET
Basophils Absolute: 51 cells/uL (ref 0–200)
Basophils Relative: 1 %
Eosinophils Absolute: 459 cells/uL (ref 15–500)
Eosinophils Relative: 9 %
HCT: 40.9 % (ref 35.0–45.0)
Hemoglobin: 13.7 g/dL (ref 11.7–15.5)
Lymphs Abs: 1071 cells/uL (ref 850–3900)
MCH: 30.9 pg (ref 27.0–33.0)
MCHC: 33.5 g/dL (ref 32.0–36.0)
MCV: 92.3 fL (ref 80.0–100.0)
MPV: 10.5 fL (ref 7.5–12.5)
Monocytes Relative: 6.9 %
Neutro Abs: 3167 cells/uL (ref 1500–7800)
Neutrophils Relative %: 62.1 %
Platelets: 192 10*3/uL (ref 140–400)
RBC: 4.43 10*6/uL (ref 3.80–5.10)
RDW: 12 % (ref 11.0–15.0)
Total Lymphocyte: 21 %
WBC mixed population: 352 cells/uL (ref 200–950)
WBC: 5.1 10*3/uL (ref 3.8–10.8)

## 2017-07-15 LAB — TSH: TSH: 3.79 mIU/L (ref 0.40–4.50)

## 2017-07-15 LAB — URINALYSIS, ROUTINE W REFLEX MICROSCOPIC
Bilirubin Urine: NEGATIVE
Glucose, UA: NEGATIVE
Hgb urine dipstick: NEGATIVE
Ketones, ur: NEGATIVE
Leukocytes, UA: NEGATIVE
Nitrite: NEGATIVE
Protein, ur: NEGATIVE
Specific Gravity, Urine: 1.009 (ref 1.001–1.03)
pH: 6 (ref 5.0–8.0)

## 2017-07-15 LAB — HEMOGLOBIN A1C
Hgb A1c MFr Bld: 5.6 % of total Hgb (ref <5.7)
Mean Plasma Glucose: 114 (calc)
eAG (mmol/L): 6.3 (calc)

## 2017-07-15 LAB — VITAMIN D 25 HYDROXY (VIT D DEFICIENCY, FRACTURES): Vit D, 25-Hydroxy: 98 ng/mL (ref 30–100)

## 2017-07-15 LAB — LIPID PANEL
Cholesterol: 134 mg/dL (ref <200)
HDL: 62 mg/dL (ref >50)
LDL Cholesterol (Calc): 58 mg/dL (calc)
Non-HDL Cholesterol (Calc): 72 mg/dL (calc) (ref <130)
Total CHOL/HDL Ratio: 2.2 (calc) (ref <5.0)
Triglycerides: 62 mg/dL (ref <150)

## 2017-07-15 LAB — MAGNESIUM: Magnesium: 2.3 mg/dL (ref 1.5–2.5)

## 2017-07-15 LAB — MICROALBUMIN / CREATININE URINE RATIO
Creatinine, Urine: 27 mg/dL (ref 20–275)
Microalb Creat Ratio: 7 mcg/mg creat (ref <30)
Microalb, Ur: 0.2 mg/dL

## 2017-07-15 LAB — INSULIN, RANDOM: Insulin: 8.7 u[IU]/mL (ref 2.0–19.6)

## 2017-07-16 ENCOUNTER — Encounter: Payer: Self-pay | Admitting: Internal Medicine

## 2017-07-27 ENCOUNTER — Ambulatory Visit: Payer: Self-pay | Admitting: Internal Medicine

## 2017-07-30 ENCOUNTER — Other Ambulatory Visit: Payer: Self-pay | Admitting: Internal Medicine

## 2017-09-15 LAB — HM DIABETES EYE EXAM

## 2017-10-06 ENCOUNTER — Encounter: Payer: Self-pay | Admitting: *Deleted

## 2017-11-01 DIAGNOSIS — Z6824 Body mass index (BMI) 24.0-24.9, adult: Secondary | ICD-10-CM | POA: Insufficient documentation

## 2017-11-01 DIAGNOSIS — Z6823 Body mass index (BMI) 23.0-23.9, adult: Secondary | ICD-10-CM | POA: Insufficient documentation

## 2017-11-01 NOTE — Progress Notes (Signed)
FOLLOW UP  Assessment and Plan:   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) Control blood pressure, cholesterol, glucose, increase exercise.  Prescribed nitroglycerine without recent use Followed by cardiology  Hypertension Well controlled with current medications  Monitor blood pressure at home; patient to call if consistently greater than 130/80 Continue DASH diet.   Reminder to go to the ER if any CP, SOB, nausea, dizziness, severe HA, changes vision/speech, left arm numbness and tingling and jaw pain.  Cholesterol Currently at goal; continue vytorin Continue low cholesterol diet and exercise.  Check lipid panel.   Other abnormal glucose Last A1C at goal Discussed diet/exercise, weight management  Defer A1C; check CMP  BMI 24 Continue to recommend diet heavy in fruits and veggies and low in animal meats, cheeses, and dairy products, appropriate calorie intake Discuss exercise recommendations routinely Continue to monitor weight at each visit  Vitamin D Def At goal at last visit; continue supplementation to maintain goal of 70-100 Defer Vit D level  Gastroesophageal reflux disease, esophagitis presence not specified Doing well after taper off of PPI, now on H2 inhibitor Well managed on current medications Discussed diet, avoiding triggers and other lifestyle changes  Lumbar pain Multiple joints, no radicular symptoms, not doing well with conservative therapy Will refer to ortho per patient preference  Continue diet and meds as discussed. Further disposition pending results of labs. Discussed med's effects and SE's.   Over 30 minutes of exam, counseling, chart review, and critical decision making was performed.   Future Appointments  Date Time Provider Department Center  02/07/2018 10:30 AM [name], MD [facility] None  08/15/2018 10:00 AM [name], MD [facility] None     ----------------------------------------------------------------------------------------------------------------------  HPI 77 y.o. female  presents for 3 month follow up on hypertension, cholesterol, glucose mangement, weight and vitamin D deficiency. 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 , she underwent CABG. Negative myocardial scan noted in 2015. Continues to follow up with Dr. Tresa Endo.  She presents today complaining of worsening lumbar back pain with radiation to bilateral hips; known lumbar scoliosis, no pain with sitting but significant pain with any movement significantly limiting activity. Currently taking mobic 15 mg daily which used to help, but benefit seems limited. Discussed options and she would like a referral to ortho to discuss. Also has right shoulder pain, right foot pain.   BMI is Body mass index is 23.29 kg/m., she has been working on diet and exercise. Wt Readings from Last 3 Encounters:  11/03/17 133 lb 9.6 oz (60.6 kg)  07/14/17 138 lb (62.6 kg)  06/03/17 143 lb 9.6 oz (65.1 kg)   Her blood pressure has been controlled at home, today their BP is BP: 110/70  She does workout. She denies chest pain, shortness of breath, dizziness.   She is on cholesterol medication (vytorin) and denies myalgias. Her cholesterol is at goal. The cholesterol last visit was:   Lab Results  Component Value Date   CHOL 134 07/14/2017   HDL 62 07/14/2017   LDLCALC 58 07/14/2017   TRIG 62 07/14/2017   CHOLHDL 2.2 07/14/2017    She has been working on diet and exercise for glucose management, and denies foot ulcerations, increased appetite, nausea, paresthesia of the feet, polydipsia, polyuria, visual disturbances and vomiting. Last A1C in the office was:  Lab Results  Component Value Date   HGBA1C 5.6 07/14/2017  Patient is on Vitamin D supplement.   Lab Results  Component Value Date   VD25OH 98 07/14/2017        Current Medications:  Current  Outpatient Medications on File Prior to Visit  Medication Sig  . aspirin 81 MG tablet Take 81 mg by mouth daily.  Marland Kitchen CALCIUM CITRATE PO Take 500 mg by mouth daily.  . Cholecalciferol (VITAMIN D) 2000 UNITS tablet Take 2,000 Units by mouth 2 (two) times daily.  Marland Kitchen DILT-XR 180 MG 24 hr capsule TAKE 1 CAPSULE BY MOUTH DAILY  . fexofenadine (ALLEGRA) 180 MG tablet Take 180 mg by mouth daily.  . fluticasone (FLONASE) 50 MCG/ACT nasal spray SHAKE LIQUID AND USE 2 SPRAYS IN EACH NOSTRIL TWICE DAILY  . fluticasone (VERAMYST) 27.5 MCG/SPRAY nasal spray Place 2 sprays into the nose daily as needed for rhinitis.  . IRON PO Take 50 mg by mouth daily.  Marland Kitchen labetalol (NORMODYNE) 100 MG tablet Take 1.5 tablets (150 mg total) by mouth at bedtime. (Patient taking differently: Take 100 mg by mouth at bedtime. )  . Magnesium 400 MG CAPS Take by mouth daily.  . meloxicam (MOBIC) 15 MG tablet TAKE 1/2 TO 1 TABLET BY MOUTH DAILY ON A FULL STOMACH FOR PAIN AND INFLAMMATION  . Multiple Vitamins-Minerals (MULTIVITAMIN WITH MINERALS) tablet Take 1 tablet by mouth daily.  . nitroGLYCERIN (NITROSTAT) 0.4 MG SL tablet Place 1 tablet (0.4 mg total) under the tongue every 5 (five) minutes as needed for chest pain.  . Probiotic Product (PROBIOTIC-10 PO) Take 1 capsule by mouth.  . ranitidine (ZANTAC) 150 MG tablet Take 1 tablet (150 mg total) by mouth 2 (two) times daily.  . vitamin C (ASCORBIC ACID) 500 MG tablet Take 500 mg by mouth daily.  Marland Kitchen VYTORIN 10-20 MG tablet TAKE 1 TABLET BY MOUTH EVERY DAY   No current facility-administered medications on file prior to visit.      Allergies:  Allergies  Allergen Reactions  . Altace [Ramipril]     cough  . Azithromycin     palpitation  . Celebrex [Celecoxib]     Gi upset   . Hydrochlorothiazide     cramping  . 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 Prilosec [Omeprazole]     constipation     Medical History:  Past Medical History:   Diagnosis Date  . COPD (chronic obstructive pulmonary disease) (HCC)   . Fibromyalgia   . GERD (gastroesophageal reflux disease)   . IBS (irritable bowel syndrome)   . Scoliosis    Family history- Reviewed and unchanged Social history- Reviewed and unchanged   Review of Systems:  Review of Systems  Constitutional: Negative for malaise/fatigue and weight loss.  HENT: Negative for hearing loss and tinnitus.   Eyes: Negative for blurred vision and double vision.  Respiratory: Negative for cough, shortness of breath and wheezing.   Cardiovascular: Negative for chest pain, palpitations, orthopnea, claudication and leg swelling.  Gastrointestinal: Negative for abdominal pain, blood in stool, constipation, diarrhea, heartburn, melena, nausea and vomiting.  Genitourinary: Negative.   Musculoskeletal: Positive for back pain and joint pain. Negative for myalgias.  Skin: Negative for rash.  Neurological: Negative for dizziness, tingling, sensory change, weakness and headaches.  Endo/Heme/Allergies: Negative for polydipsia.  Psychiatric/Behavioral: Negative.   All other systems reviewed and are negative.   Physical Exam: BP 110/70   Pulse (!) 55   Temp (!) 97.3 F (36.3 C)   Ht 5' 3.5" (  1.613 m)   Wt 133 lb 9.6 oz (60.6 kg)   SpO2 97%   BMI 23.29 kg/m  Wt Readings from Last 3 Encounters:  11/03/17 133 lb 9.6 oz (60.6 kg)  07/14/17 138 lb (62.6 kg)  06/03/17 143 lb 9.6 oz (65.1 kg)   General Appearance: Well nourished, in no apparent distress. Eyes: PERRLA, EOMs, conjunctiva no swelling or erythema Sinuses: No Frontal/maxillary tenderness ENT/Mouth: Ext aud canals clear, TMs without erythema, bulging. No erythema, swelling, or exudate on post pharynx.  Tonsils not swollen or erythematous. Hearing normal.  Neck: Supple, thyroid normal.  Respiratory: Respiratory effort normal, BS equal bilaterally without rales, rhonchi, wheezing or stridor.  Cardio: RRR with no MRGs. Brisk  peripheral pulses without edema.  Abdomen: Soft, + BS.  Non tender, no guarding, rebound, hernias, masses. Lymphatics: Non tender without lymphadenopathy.  Musculoskeletal: Full ROM, 5/5 strength, Normal gait Skin: Warm, dry without rashes, lesions, ecchymosis.  Neuro: Cranial nerves intact. No cerebellar symptoms.  Psych: Awake and oriented X 3, normal affect, Insight and Judgment appropriate.    Dan Maker, NP 10:38 AM [facility]

## 2017-11-03 ENCOUNTER — Encounter: Payer: Self-pay | Admitting: Adult Health

## 2017-11-03 ENCOUNTER — Ambulatory Visit (INDEPENDENT_AMBULATORY_CARE_PROVIDER_SITE_OTHER): Payer: Medicare Other | Admitting: Adult Health

## 2017-11-03 VITALS — BP 110/70 | HR 55 | Temp 97.3°F | Ht 63.5 in | Wt 133.6 lb

## 2017-11-03 DIAGNOSIS — E559 Vitamin D deficiency, unspecified: Secondary | ICD-10-CM

## 2017-11-03 DIAGNOSIS — K219 Gastro-esophageal reflux disease without esophagitis: Secondary | ICD-10-CM

## 2017-11-03 DIAGNOSIS — Z79899 Other long term (current) drug therapy: Secondary | ICD-10-CM

## 2017-11-03 DIAGNOSIS — M545 Low back pain, unspecified: Secondary | ICD-10-CM

## 2017-11-03 DIAGNOSIS — E782 Mixed hyperlipidemia: Secondary | ICD-10-CM

## 2017-11-03 DIAGNOSIS — I10 Essential (primary) hypertension: Secondary | ICD-10-CM

## 2017-11-03 DIAGNOSIS — R7309 Other abnormal glucose: Secondary | ICD-10-CM

## 2017-11-03 DIAGNOSIS — I25118 Atherosclerotic heart disease of native coronary artery with other forms of angina pectoris: Secondary | ICD-10-CM | POA: Diagnosis not present

## 2017-11-03 DIAGNOSIS — Z6824 Body mass index (BMI) 24.0-24.9, adult: Secondary | ICD-10-CM

## 2017-11-03 LAB — LIPID PANEL
Cholesterol: 150 mg/dL (ref <200)
HDL: 61 mg/dL (ref >50)
LDL Cholesterol (Calc): 71 mg/dL (calc)
Non-HDL Cholesterol (Calc): 89 mg/dL (calc) (ref <130)
Total CHOL/HDL Ratio: 2.5 (calc) (ref <5.0)
Triglycerides: 92 mg/dL (ref <150)

## 2017-11-03 LAB — CBC WITH DIFFERENTIAL/PLATELET
Basophils Absolute: 48 cells/uL (ref 0–200)
Basophils Relative: 0.9 %
Eosinophils Absolute: 297 cells/uL (ref 15–500)
Eosinophils Relative: 5.6 %
HCT: 39.5 % (ref 35.0–45.0)
Hemoglobin: 13.2 g/dL (ref 11.7–15.5)
Lymphs Abs: 1198 cells/uL (ref 850–3900)
MCH: 30.8 pg (ref 27.0–33.0)
MCHC: 33.4 g/dL (ref 32.0–36.0)
MCV: 92.3 fL (ref 80.0–100.0)
MPV: 10.1 fL (ref 7.5–12.5)
Monocytes Relative: 7.9 %
Neutro Abs: 3339 cells/uL (ref 1500–7800)
Neutrophils Relative %: 63 %
Platelets: 189 10*3/uL (ref 140–400)
RBC: 4.28 10*6/uL (ref 3.80–5.10)
RDW: 11.9 % (ref 11.0–15.0)
Total Lymphocyte: 22.6 %
WBC mixed population: 419 cells/uL (ref 200–950)
WBC: 5.3 10*3/uL (ref 3.8–10.8)

## 2017-11-03 LAB — COMPLETE METABOLIC PANEL WITH GFR
AG Ratio: 2.4 (calc) (ref 1.0–2.5)
ALT: 20 U/L (ref 6–29)
AST: 23 U/L (ref 10–35)
Albumin: 4.6 g/dL (ref 3.6–5.1)
Alkaline phosphatase (APISO): 64 U/L (ref 33–130)
BUN: 15 mg/dL (ref 7–25)
CO2: 28 mmol/L (ref 20–32)
Calcium: 9.7 mg/dL (ref 8.6–10.4)
Chloride: 104 mmol/L (ref 98–110)
Creat: 0.78 mg/dL (ref 0.60–0.93)
GFR, Est African American: 86 mL/min/{1.73_m2} (ref >=60)
GFR, Est Non African American: 74 mL/min/{1.73_m2} (ref >=60)
Globulin: 1.9 g/dL (calc) (ref 1.9–3.7)
Glucose, Bld: 90 mg/dL (ref 65–99)
Potassium: 4.1 mmol/L (ref 3.5–5.3)
Sodium: 141 mmol/L (ref 135–146)
Total Bilirubin: 0.6 mg/dL (ref 0.2–1.2)
Total Protein: 6.5 g/dL (ref 6.1–8.1)

## 2017-11-03 LAB — MAGNESIUM: Magnesium: 2.3 mg/dL (ref 1.5–2.5)

## 2017-11-03 LAB — TSH: TSH: 3.1 mIU/L (ref 0.40–4.50)

## 2017-11-03 NOTE — Patient Instructions (Signed)
Know what a healthy weight is for you (roughly BMI <25) and aim to maintain this  Aim for 7+ servings of fruits and vegetables daily  65-80+ fluid ounces of water or unsweet tea for healthy kidneys  Limit to max 1 drink of alcohol per day; avoid smoking/tobacco  Limit animal fats in diet for cholesterol and heart health - choose grass fed whenever available  Avoid highly processed foods, and foods high in saturated/trans fats  Aim for low stress - take time to unwind and care for your mental health  Aim for 150 min of moderate intensity exercise weekly for heart health, and weights twice weekly for bone health  Aim for 7-9 hours of sleep daily    

## 2017-11-09 ENCOUNTER — Encounter: Payer: Self-pay | Admitting: *Deleted

## 2017-11-09 NOTE — Congregational Nurse Program (Signed)
[id number]/spoke with client concerning recent death of family member.  Seems to be handling the occurrence well.   Member had been ill for long time and family new that everything possible was being done for the family member who expired.  Discussed funeral arrangements and if member would be buried or cremated.  Will be buried beside a brother. Spoke about if hospice grieving group was needed/she did not think so.  Offered my services if any of the family needs to talk or get resources.  Merril Nagy,BSN,RN3,CCM.

## 2017-11-14 ENCOUNTER — Other Ambulatory Visit: Payer: Self-pay | Admitting: Adult Health

## 2017-11-14 DIAGNOSIS — K219 Gastro-esophageal reflux disease without esophagitis: Secondary | ICD-10-CM

## 2017-11-24 ENCOUNTER — Other Ambulatory Visit: Payer: Self-pay

## 2017-11-24 ENCOUNTER — Ambulatory Visit: Payer: Medicare Other | Attending: Orthopedic Surgery | Admitting: Physical Therapy

## 2017-11-24 DIAGNOSIS — R293 Abnormal posture: Secondary | ICD-10-CM | POA: Diagnosis present

## 2017-11-24 DIAGNOSIS — M545 Low back pain, unspecified: Secondary | ICD-10-CM

## 2017-11-24 NOTE — Patient Instructions (Signed)
Bridge   Lie back, legs bent. Lift hips toward ceiling. Hold 3-5 sec. Repeat 10-30 times. Do __2__ sessions per day.    Knee-to-Chest Stretch: Unilateral    With hand behind right knee, pull knee in to chest until a comfortable stretch is felt in lower back and buttocks. Keep back relaxed. Hold _30___ seconds. Repeat _3___ times per set. Do ____ sets per session. Do __2__ sessions per day.  Hip Stretch  Put right ankle over left knee. Let right knee fall downward, but keep ankle in place. Feel the stretch in hip. May push down gently with hand to feel stretch. Hold _30-60___ seconds while counting out loud. Repeat with other leg. Repeat _3___ times. Do ___2_ sessions per day.   Solon Palm, PT 11/24/17 3:23 PM [facility] [place] [address] [place], [place], [address] Phone: [phone]   Fax:  [phone]

## 2017-11-24 NOTE — Therapy (Signed)
[facility] [place] [place] [address] [place], [place], [address] Phone: [phone]   Fax:  [phone]  Physical Therapy Evaluation  Patient Details  Name: Jasmine Reeves MRN: [id number] Date of Birth: 08-Jul-1940 Referring Provider (PT): Dumonski   Encounter Date: 11/24/2017  PT End of Session - 11/24/17 1430    Visit Number  1    Date for PT Re-Evaluation  02/16/18    Authorization Type  MCR    PT Start Time  1430    PT Stop Time  1524    PT Time Calculation (min)  54 min       Past Medical History:  Diagnosis Date  . COPD (chronic obstructive pulmonary disease) (HCC)   . Fibromyalgia   . GERD (gastroesophageal reflux disease)   . IBS (irritable bowel syndrome)   . Scoliosis     Past Surgical History:  Procedure Laterality Date  . ABDOMINAL HYSTERECTOMY    . APPENDECTOMY    . CORONARY ANGIOPLASTY  12/11  . CORONARY ARTERY BYPASS GRAFT    . ESOPHAGOGASTRODUODENOSCOPY  07/21/12   polyps, Hiatal hernia Dr. Matthias Hughs  . TONSILLECTOMY    . TUBAL LIGATION      There were no vitals filed for this visit.   Subjective Assessment - 11/24/17 1443    Subjective  Patient has long h/o of intermittent back pain due to scoliosis. 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 It is now intermitent, but the pain makes her dizzy or almost pass out when it happens.     Pertinent History  scoliosis, low back pain, OA, COPD, prediabetic, Raynaud's, OA R foot, bursitis R shoulder, Open heart surgery 18 yrs ago    Diagnostic tests  xrays - stable since previous xray years ago    Patient Stated Goals  to get rid of pain    Currently in Pain?  Yes    Pain Score  2    up to 10/10   Pain Location  Back    Pain Orientation  Right;Lower    Pain Descriptors / Indicators  Sharp;Aching    Pain Type  Chronic pain    Aggravating Factors   prolonged standing     Pain Relieving Factors  walking    Effect of Pain on  Daily Activities  limits          [facility] PT Assessment - 11/24/17 0001      Assessment   Medical Diagnosis  lumbar facet arthritis    Referring Provider (PT)  Dumonski    Onset Date/Surgical Date  10/13/17    Next MD Visit  none       Precautions   Precautions  None      Balance Screen   Has the patient fallen in the past 6 months  No    Has the patient had a decrease in activity level because of a fear of falling?   No    Is the patient reluctant to leave their home because of a fear of falling?   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  Posture/Postural Control   Posture Comments  Right thoracic left lumbar scoliosis, Right lateral shift, elevated L shoulder and hip      ROM / Strength   AROM / PROM / Strength  AROM;Strength      AROM   AROM Assessment Site  Lumbar    Lumbar Flexion  WFL    Lumbar Extension  WFL    Lumbar - Right Side Bend  limited by scoliosis    Lumbar - Left Side Bend  limited by scoliosis    Lumbar - Right Rotation  WFL    Lumbar - Left Rotation  [facility]      Strength   Overall Strength Comments  knee flex R 4/5  L4+/5 ;  bil ext 5/5; weak left lumbar stabilizers with MMT    Strength Assessment Site  Hip    Right/Left Hip  Right;Left    Right Hip Flexion  4-/5    Right Hip Extension  4-/5    Right Hip ABduction  5/5    Left Hip Flexion  4+/5    Left Hip Extension  5/5    Left Hip ABduction  5/5      Flexibility   Soft Tissue Assessment /Muscle Length  yes    Hamstrings  Bil L >R    Quadriceps  Bil     Piriformis  Bil R>L    Quadratus Lumborum  R      Palpation   Palpation comment  TPs in bil gluteals R>L; tender in bil lumbar      Special Tests   Other special tests  neg hip scour on R                 Objective measurements completed on examination: See above findings.              PT Education - 11/24/17 1549    Education Details  HEP; Self care body mechanics    Person(s) Educated  Patient    Methods  Explanation;Demonstration;Handout    Comprehension  Verbalized understanding;Returned demonstration       PT Short Term Goals - 11/24/17 1537      PT SHORT TERM GOAL #1   Title  Ind with inital HEP    Time  2    Period  Weeks    Status  New    Target Date  12/08/17      PT SHORT TERM GOAL #2   Title  decreased pain with ADLs by 50% in the low back    Time  4    Period  Weeks    Status  New    Target Date  12/22/17        PT Long Term Goals - 11/24/17 1538      PT LONG TERM GOAL #1   Title  Patient able to perform ADLS with only intermittent pain in the low back of 1-2/10.    Time  8    Period  Weeks    Status  New    Target Date  02/16/18      PT LONG TERM GOAL #2   Title  Patient able to report no greater than 2/10 with standing after prolonged sitting.    Time  8    Period  Weeks    Status  New      PT LONG TERM GOAL #3   Title  Pt to demo improved BLE strength to 4+/5 or better to ease ADLS  Time  8    Period  Weeks    Status  New      PT LONG TERM GOAL #4   Title  Pt able to demonstrate and/or verbalize proper body mechanics to prevent further injury.    Time  8    Period  Weeks    Status  New             Plan - 11/24/17 1528    Clinical Impression Statement  Patient has long h/o of intermittent back pain due to scoliosis. About 5-6 weeks ago she was serving food at church and had excruciating pain. It is now intermitent, but occurs mainly with slight lumbar flexion and prolonged sitting.  She reports relief with extension and bridging. Patient has postural deficits from scoliosis and flexibility deficits in BLE. She also has strength deficits in her core and BLE.     History and Personal Factors relevant to  plan of care:  scoliosis, low back pain, OA, COPD, prediabetic, Raynaud's, OA R foot, bursitis R shoulder, Open heart surgery 18 yrs ago    Clinical Presentation  Evolving    Clinical Presentation due to:  worsening sx    Clinical Decision Making  Moderate    Rehab Potential  Good    PT Frequency  2x / week    PT Duration  8 weeks    PT Treatment/Interventions  ADLs/Self Care Home Management;Cryotherapy;Electrical Stimulation;Moist Heat;Traction;Therapeutic exercise;Neuromuscular re-education;Patient/family education;Manual techniques;Dry needling;Taping    PT Next Visit Plan  review HEP, BLE flexibility, extension biased exercise, core/LE strength; body mechanics; DN to gluteals prn.    PT Home Exercise Plan  bridge, seated piriformis, SKTC    Consulted and Agree with Plan of Care  Patient       Patient will benefit from skilled therapeutic intervention in order to improve the following deficits and impairments:  Pain, Postural dysfunction, Decreased range of motion, Decreased strength, Impaired flexibility  Visit Diagnosis: Acute bilateral low back pain without sciatica - Plan: PT plan of care cert/re-cert  Abnormal posture - Plan: PT plan of care cert/re-cert     Problem List Patient Active Problem List   Diagnosis Date Noted  . BMI 24.0-24.9, adult 11/01/2017  . Osteopenia 04/12/2017  . OSA on CPAP 11/27/2014  . IBS (irritable bowel syndrome) 11/11/2014  . Encounter for Medicare annual wellness exam 11/11/2014  . Medication management 04/12/2014  . Abnormal blood sugar 10/10/2013  . Mixed hyperlipidemia 10/08/2013  . CAD (coronary artery disease) 02/03/2013  . MVP (mitral valve prolapse) 02/03/2013  . Vitamin D deficiency   . GERD (gastroesophageal reflux disease)   . COPD (chronic obstructive pulmonary disease) (HCC) 02/11/2010  . Essential hypertension 01/28/2010  . RAYNAUDS SYNDROME 01/28/2010    Tyre Beaver PT 11/24/2017, 3:49 PM  [facility] [place] [facility] [address] [place], [place], [address] Phone: [phone]   Fax:  [phone]  Name: ANNASTACIA DUBA MRN: [id number] Date of Birth: [birth year]

## 2017-11-28 ENCOUNTER — Encounter: Payer: Self-pay | Admitting: Physical Therapy

## 2017-11-28 ENCOUNTER — Ambulatory Visit: Payer: Medicare Other | Admitting: Physical Therapy

## 2017-11-28 DIAGNOSIS — M545 Low back pain, unspecified: Secondary | ICD-10-CM

## 2017-11-28 DIAGNOSIS — R293 Abnormal posture: Secondary | ICD-10-CM

## 2017-11-28 NOTE — Therapy (Signed)
[place], [place], [address] Phone: [phone]   Fax:  [phone]  Physical Therapy Treatment  Patient Details  Name: Jasmine Reeves MRN: [id number] Date of Birth: January 28, 1941 Referring Provider (PT): Dumonski   Encounter Date: 11/28/2017  PT End of Session - 11/28/17 0844    Visit Number  2    Date for PT Re-Evaluation  02/16/18    PT Start Time  0756    PT Stop Time  0855    PT Time Calculation (min)  59 min    Activity Tolerance  Patient tolerated treatment well    Behavior During Therapy  [facility] for tasks assessed/performed       Past Medical History:  Diagnosis Date  . COPD (chronic obstructive pulmonary disease) ([place])   . Fibromyalgia   . GERD (gastroesophageal reflux disease)   . IBS (irritable bowel syndrome)   . Scoliosis     Past Surgical History:  Procedure Laterality Date  . ABDOMINAL HYSTERECTOMY    . APPENDECTOMY    . CORONARY ANGIOPLASTY  12/11  . CORONARY ARTERY BYPASS GRAFT    . ESOPHAGOGASTRODUODENOSCOPY  07/21/12   polyps, Hiatal hernia Dr. Cristina Gong  . TONSILLECTOMY    . TUBAL LIGATION      There were no vitals filed for this visit.  Subjective Assessment - 11/28/17 0803    Subjective  Patient reports that she is still feeling pain, describes that the time it started she was not lifting but maybe reaching and standing    Currently in Pain?  Yes    Pain Score  2     Pain Location  Back    Pain Orientation  Right;Lower    Aggravating Factors   standing and bending                       [facility] Adult PT Treatment/Exercise - 11/28/17 0001      Therapeutic Activites    Therapeutic Activities  ADL's;Lifting    ADL's  standing with foot prop, vacuum, mopping, raking    Lifting  golfer's, physics of weight close, no twisting only pivoting, back straing, lower center of gravity      Exercises   Exercises  Lumbar      Lumbar Exercises: Stretches   Passive Hamstring Stretch  3 reps;20 seconds;Right;Left    Piriformis Stretch  Right;Left;3 reps;20 seconds      Lumbar Exercises: Aerobic   Nustep  level 4 x 6 minutes      Lumbar Exercises: Machines for Strengthening   Cybex Knee Extension  5# 2x10    Cybex Knee Flexion  20# 2x10    Other Lumbar Machine Exercise  seated row 10#, lats 15# 2x10 each      Lumbar Exercises: Standing   Other Standing Lumbar Exercises  red tband straight arm pulls for core activation    Other Standing Lumbar Exercises  W backs      Lumbar Exercises: Supine   Other Supine Lumbar Exercises  feet on ball K2C, trunk rotation, small bridges and isometric abs      Modalities   Modalities  Electrical Stimulation;Moist Heat      Moist Heat Therapy   Number Minutes Moist Heat  15 Minutes    Moist Heat Location  Lumbar Spine      Electrical Stimulation   Electrical Stimulation Location  lumbar spine    Electrical Stimulation Action  IFC  Electrical Stimulation Parameters  supine    Electrical Stimulation Goals  Pain               PT Short Term Goals - 11/28/17 0847      PT SHORT TERM GOAL #1   Title  Ind with inital HEP    Status  Partially Met        PT Long Term Goals - 11/24/17 1538      PT LONG TERM GOAL #1   Title  Patient able to perform ADLS with only intermittent pain in the low back of 1-2/10.    Time  8    Period  Weeks    Status  New    Target Date  02/16/18      PT LONG TERM GOAL #2   Title  Patient able to report no greater than 2/10 with standing after prolonged sitting.    Time  8    Period  Weeks    Status  New      PT LONG TERM GOAL #3   Title  Pt to demo improved BLE strength to 4+/5 or better to ease ADLS    Time  8    Period  Weeks    Status  New      PT LONG TERM GOAL #4   Title  Pt able to demonstrate and/or verbalize proper body mechanics to prevent further injury.    Time  8    Period  Weeks    Status  New            Plan - 11/28/17 0844     Clinical Impression Statement  Patient had some difficulty with activating abdominals.  She needed verbal and tactile cues.  She had questions about housework.  May need more demonstration for cleaning bathroom    PT Next Visit Plan  review HEP, BLE flexibility, extension biased exercise, core/LE strength; body mechanics; DN to gluteals prn.    Consulted and Agree with Plan of Care  Patient       Patient will benefit from skilled therapeutic intervention in order to improve the following deficits and impairments:  Pain, Postural dysfunction, Decreased range of motion, Decreased strength, Impaired flexibility  Visit Diagnosis: Acute bilateral low back pain without sciatica  Abnormal posture     Problem List Patient Active Problem List   Diagnosis Date Noted  . BMI 24.0-24.9, adult 11/01/2017  . Osteopenia 04/12/2017  . OSA on CPAP 11/27/2014  . IBS (irritable bowel syndrome) 11/11/2014  . Encounter for Medicare annual wellness exam 11/11/2014  . Medication management 04/12/2014  . Abnormal blood sugar 10/10/2013  . Mixed hyperlipidemia 10/08/2013  . CAD (coronary artery disease) 02/03/2013  . MVP (mitral valve prolapse) 02/03/2013  . Vitamin D deficiency   . GERD (gastroesophageal reflux disease)   . COPD (chronic obstructive pulmonary disease) ([place]) 02/11/2010  . Essential hypertension 01/28/2010  . RAYNAUDS SYNDROME 01/28/2010    Sumner Boast., PT 11/28/2017, 8:47 AM  [facility] [place], [place], [address] Phone: [phone]   Fax:  [phone]  Name: CHELSEE HOSIE MRN: [id number] Date of Birth: 09-Jan-1941

## 2017-11-30 ENCOUNTER — Encounter: Payer: Self-pay | Admitting: Physical Therapy

## 2017-11-30 ENCOUNTER — Ambulatory Visit: Payer: Medicare Other | Attending: Orthopedic Surgery | Admitting: Physical Therapy

## 2017-11-30 DIAGNOSIS — M545 Low back pain, unspecified: Secondary | ICD-10-CM

## 2017-11-30 DIAGNOSIS — R293 Abnormal posture: Secondary | ICD-10-CM | POA: Diagnosis present

## 2017-11-30 NOTE — Therapy (Signed)
[place], [place], [address] Phone: [phone]   Fax:  [phone]  Physical Therapy Treatment  Patient Details  Name: Jasmine Reeves MRN: [id number] Date of Birth: 1940-07-23 Referring Provider (PT): Dumonski   Encounter Date: 11/30/2017  PT End of Session - 11/30/17 1512    Visit Number  3    Date for PT Re-Evaluation  02/16/18    Authorization Type  MCR    PT Start Time  1430    PT Stop Time  1526    PT Time Calculation (min)  56 min    Activity Tolerance  Patient tolerated treatment well    Behavior During Therapy  [facility] for tasks assessed/performed       Past Medical History:  Diagnosis Date  . COPD (chronic obstructive pulmonary disease) ([place])   . Fibromyalgia   . GERD (gastroesophageal reflux disease)   . IBS (irritable bowel syndrome)   . Scoliosis     Past Surgical History:  Procedure Laterality Date  . ABDOMINAL HYSTERECTOMY    . APPENDECTOMY    . CORONARY ANGIOPLASTY  12/11  . CORONARY ARTERY BYPASS GRAFT    . ESOPHAGOGASTRODUODENOSCOPY  07/21/12   polyps, Hiatal hernia Dr. Cristina Gong  . TONSILLECTOMY    . TUBAL LIGATION      There were no vitals filed for this visit.  Subjective Assessment - 11/30/17 1428    Subjective  Pt reports that she is still having pain even with simple task     Currently in Pain?  Yes    Pain Score  3     Pain Location  Back    Pain Orientation  Right;Mid                       [facility] Adult PT Treatment/Exercise - 11/30/17 0001      Exercises   Exercises  Lumbar      Lumbar Exercises: Aerobic   Nustep  level 4 x 6 minutes      Lumbar Exercises: Machines for Strengthening   Cybex Knee Extension  5# 2x10    Cybex Knee Flexion  20# 2x10    Other Lumbar Machine Exercise  seated row 10#, lats 15# 2x10 each      Lumbar Exercises: Standing   Row  Power tower;Both;20 reps;Theraband    Other Standing Lumbar Exercises  red  tband straight arm pulls for core activation      Lumbar Exercises: Seated   Sit to Stand  10 reps;5 reps    Other Seated Lumbar Exercises  ab sets 2x10 2''       Lumbar Exercises: Supine   Other Supine Lumbar Exercises  feet on ball K2C, trunk rotation, small bridges and isometric abs      Modalities   Modalities  Electrical Stimulation;Moist Heat      Moist Heat Therapy   Number Minutes Moist Heat  15 Minutes    Moist Heat Location  Lumbar Spine      Electrical Stimulation   Electrical Stimulation Location  lumbar spine    Electrical Stimulation Action  IFC    Electrical Stimulation Parameters  supine    Electrical Stimulation Goals  Pain               PT Short Term Goals - 11/28/17 0847      PT SHORT TERM GOAL #1   Title  Ind with inital HEP  Status  Partially Met        PT Long Term Goals - 11/24/17 1538      PT LONG TERM GOAL #1   Title  Patient able to perform ADLS with only intermittent pain in the low back of 1-2/10.    Time  8    Period  Weeks    Status  New    Target Date  02/16/18      PT LONG TERM GOAL #2   Title  Patient able to report no greater than 2/10 with standing after prolonged sitting.    Time  8    Period  Weeks    Status  New      PT LONG TERM GOAL #3   Title  Pt to demo improved BLE strength to 4+/5 or better to ease ADLS    Time  8    Period  Weeks    Status  New      PT LONG TERM GOAL #4   Title  Pt able to demonstrate and/or verbalize proper body mechanics to prevent further injury.    Time  8    Period  Weeks    Status  New            Plan - 11/30/17 1513    Clinical Impression Statement  Pt did well with today's activities. Required some cues for core activation with seated ab sets. Postural cues needed to sit up straight up with seated rows. No reports of increase pain.     Rehab Potential  Good    PT Frequency  2x / week    PT Duration  8 weeks    PT Next Visit Plan  BLE flexibility, extension biased  exercise, core/LE strength; body mechanics; DN to gluteals prn.       Patient will benefit from skilled therapeutic intervention in order to improve the following deficits and impairments:  Pain, Postural dysfunction, Decreased range of motion, Decreased strength, Impaired flexibility  Visit Diagnosis: Abnormal posture  Acute bilateral low back pain without sciatica     Problem List Patient Active Problem List   Diagnosis Date Noted  . BMI 24.0-24.9, adult 11/01/2017  . Osteopenia 04/12/2017  . OSA on CPAP 11/27/2014  . IBS (irritable bowel syndrome) 11/11/2014  . Encounter for Medicare annual wellness exam 11/11/2014  . Medication management 04/12/2014  . Abnormal blood sugar 10/10/2013  . Mixed hyperlipidemia 10/08/2013  . CAD (coronary artery disease) 02/03/2013  . MVP (mitral valve prolapse) 02/03/2013  . Vitamin D deficiency   . GERD (gastroesophageal reflux disease)   . COPD (chronic obstructive pulmonary disease) ([place]) 02/11/2010  . Essential hypertension 01/28/2010  . RAYNAUDS SYNDROME 01/28/2010    Scot Jun, PTA 11/30/2017, 3:14 PM  [place], [place], [address] Phone: [phone]   Fax:  [phone]  Name: RAYYA YAGI MRN: [id number] Date of Birth: 02/16/1941

## 2017-12-05 ENCOUNTER — Encounter: Payer: Self-pay | Admitting: Physical Therapy

## 2017-12-05 ENCOUNTER — Ambulatory Visit: Payer: Medicare Other | Admitting: Physical Therapy

## 2017-12-05 DIAGNOSIS — R293 Abnormal posture: Secondary | ICD-10-CM | POA: Diagnosis not present

## 2017-12-05 DIAGNOSIS — M545 Low back pain, unspecified: Secondary | ICD-10-CM

## 2017-12-05 NOTE — Therapy (Signed)
[place], [place], [address] Phone: [phone]   Fax:  [phone]  Physical Therapy Treatment  Patient Details  Name: Jasmine Reeves MRN: [id number] Date of Birth: Apr 22, 1940 Referring Provider (PT): Dumonski   Encounter Date: 12/05/2017  PT End of Session - 12/05/17 1231    Visit Number  4    Date for PT Re-Evaluation  02/16/18    PT Start Time  9509    PT Stop Time  1244    PT Time Calculation (min)  56 min    Activity Tolerance  Patient tolerated treatment well    Behavior During Therapy  [facility] for tasks assessed/performed       Past Medical History:  Diagnosis Date  . COPD (chronic obstructive pulmonary disease) ([place])   . Fibromyalgia   . GERD (gastroesophageal reflux disease)   . IBS (irritable bowel syndrome)   . Scoliosis     Past Surgical History:  Procedure Laterality Date  . ABDOMINAL HYSTERECTOMY    . APPENDECTOMY    . CORONARY ANGIOPLASTY  12/11  . CORONARY ARTERY BYPASS GRAFT    . ESOPHAGOGASTRODUODENOSCOPY  07/21/12   polyps, Hiatal hernia Dr. Cristina Gong  . TONSILLECTOMY    . TUBAL LIGATION      There were no vitals filed for this visit.  Subjective Assessment - 12/05/17 1153    Subjective  "About the same, I think I has moved over to the other side" Pt reports going to the [place] game yesterday without issues.    Currently in Pain?  Yes    Pain Score  2     Pain Location  Back    Pain Orientation  Left                       [facility] Adult PT Treatment/Exercise - 12/05/17 0001      Exercises   Exercises  Lumbar      Lumbar Exercises: Stretches   Passive Hamstring Stretch  3 reps;20 seconds;Right;Left    Piriformis Stretch  Right;Left;3 reps;20 seconds      Lumbar Exercises: Aerobic   Nustep  level 4 x 6 minutes      Lumbar Exercises: Machines for Strengthening   Cybex Knee Extension  5# 2x10    Cybex Knee Flexion  20# 2x10    Other Lumbar  Machine Exercise  seated row 10#, lats 15# 2x10 each      Lumbar Exercises: Standing   Other Standing Lumbar Exercises  W backs      Lumbar Exercises: Supine   Ab Set  3 seconds;10 reps    Other Supine Lumbar Exercises  feet on ball K2C, trunk rotation, small bridges and isometric abs      Modalities   Modalities  Electrical Stimulation;Moist Heat      Moist Heat Therapy   Number Minutes Moist Heat  15 Minutes    Moist Heat Location  Lumbar Spine      Electrical Stimulation   Electrical Stimulation Location  lumbar spine    Electrical Stimulation Action  IDC    Electrical Stimulation Parameters  suine    Electrical Stimulation Goals  Pain               PT Short Term Goals - 11/28/17 0847      PT SHORT TERM GOAL #1   Title  Ind with inital HEP    Status  Partially Met  PT Long Term Goals - 12/05/17 1236      PT LONG TERM GOAL #1   Title  Patient able to perform ADLS with only intermittent pain in the low back of 1-2/10.    Status  Partially Met      PT LONG TERM GOAL #2   Title  Patient able to report no greater than 2/10 with standing after prolonged sitting.    Status  Partially Met            Plan - 12/05/17 1233    Clinical Impression Statement  Pt reports that her pain has moved to the L sides. Despite the reports of pain she was able to complete all of today's exercises. Tactile cues needed with seated rows to keep shoulders back. Tightness noted in both HS.    Rehab Potential  Good    PT Frequency  2x / week    PT Duration  8 weeks    PT Treatment/Interventions  ADLs/Self Care Home Management;Cryotherapy;Electrical Stimulation;Moist Heat;Traction;Therapeutic exercise;Neuromuscular re-education;Patient/family education;Manual techniques;Dry needling;Taping    PT Next Visit Plan  BLE flexibility, extension biased exercise, core/LE strength; body mechanics; DN to gluteals prn.       Patient will benefit from skilled therapeutic intervention  in order to improve the following deficits and impairments:  Pain, Postural dysfunction, Decreased range of motion, Decreased strength, Impaired flexibility  Visit Diagnosis: Abnormal posture  Acute bilateral low back pain without sciatica     Problem List Patient Active Problem List   Diagnosis Date Noted  . BMI 24.0-24.9, adult 11/01/2017  . Osteopenia 04/12/2017  . OSA on CPAP 11/27/2014  . IBS (irritable bowel syndrome) 11/11/2014  . Encounter for Medicare annual wellness exam 11/11/2014  . Medication management 04/12/2014  . Abnormal blood sugar 10/10/2013  . Mixed hyperlipidemia 10/08/2013  . CAD (coronary artery disease) 02/03/2013  . MVP (mitral valve prolapse) 02/03/2013  . Vitamin D deficiency   . GERD (gastroesophageal reflux disease)   . COPD (chronic obstructive pulmonary disease) ([place]) 02/11/2010  . Essential hypertension 01/28/2010  . RAYNAUDS SYNDROME 01/28/2010    Scot Jun, PTA 12/05/2017, 12:37 PM  [place], [place], [address] Phone: [phone]   Fax:  [phone]  Name: Jasmine Reeves MRN: [id number] Date of Birth: 02/27/1941

## 2017-12-09 ENCOUNTER — Encounter: Payer: Self-pay | Admitting: Physical Therapy

## 2017-12-09 ENCOUNTER — Ambulatory Visit: Payer: Medicare Other | Admitting: Physical Therapy

## 2017-12-09 DIAGNOSIS — M545 Low back pain, unspecified: Secondary | ICD-10-CM

## 2017-12-09 DIAGNOSIS — R293 Abnormal posture: Secondary | ICD-10-CM | POA: Diagnosis not present

## 2017-12-09 NOTE — Therapy (Signed)
[place], [place], [address] Phone: [phone]   Fax:  [phone]  Physical Therapy Treatment  Patient Details  Name: Jasmine Reeves MRN: [id number] Date of Birth: 06-19-1940 Referring Provider (PT): Dumonski   Encounter Date: 12/09/2017  PT End of Session - 12/09/17 0926    Visit Number  5    Date for PT Re-Evaluation  02/16/18    PT Start Time  0842    PT Stop Time  0940    PT Time Calculation (min)  58 min    Activity Tolerance  Patient tolerated treatment well    Behavior During Therapy  [facility] for tasks assessed/performed       Past Medical History:  Diagnosis Date  . COPD (chronic obstructive pulmonary disease) ([place])   . Fibromyalgia   . GERD (gastroesophageal reflux disease)   . IBS (irritable bowel syndrome)   . Scoliosis     Past Surgical History:  Procedure Laterality Date  . ABDOMINAL HYSTERECTOMY    . APPENDECTOMY    . CORONARY ANGIOPLASTY  12/11  . CORONARY ARTERY BYPASS GRAFT    . ESOPHAGOGASTRODUODENOSCOPY  07/21/12   polyps, Hiatal hernia Dr. Cristina Gong  . TONSILLECTOMY    . TUBAL LIGATION      There were no vitals filed for this visit.  Subjective Assessment - 12/09/17 0850    Subjective  Reports that she has been doing better, less pain overall, but reports that she had to clean out the bathroom due to some renovations and had increaed pain in the low back    Currently in Pain?  Yes    Pain Score  2     Pain Location  Back    Aggravating Factors   cleaning                       [facility] Adult PT Treatment/Exercise - 12/09/17 0001      Ambulation/Gait   Gait Comments  gait around building brisk pace      Lumbar Exercises: Stretches   Passive Hamstring Stretch  3 reps;20 seconds;Right;Left    Piriformis Stretch  Right;Left;3 reps;20 seconds      Lumbar Exercises: Aerobic   Nustep  level 4 x 6 minutes      Lumbar Exercises: Machines for  Strengthening   Other Lumbar Machine Exercise  seated row 15#, lats 15# 2x10 each      Lumbar Exercises: Standing   Other Standing Lumbar Exercises  W backs, weighted ball overhead, weighted ball trunk roations, 6 # side bends, 2.5# hip extension and abduction      Lumbar Exercises: Supine   Other Supine Lumbar Exercises  feet on ball K2C, trunk rotation, small bridges and isometric abs      Modalities   Modalities  Electrical Stimulation;Moist Heat      Moist Heat Therapy   Number Minutes Moist Heat  15 Minutes    Moist Heat Location  Lumbar Spine      Electrical Stimulation   Electrical Stimulation Location  lumbar spine    Electrical Stimulation Action  IFC    Electrical Stimulation Parameters  supine    Electrical Stimulation Goals  Pain               PT Short Term Goals - 11/28/17 0847      PT SHORT TERM GOAL #1   Title  Ind with inital HEP  Status  Partially Met        PT Long Term Goals - 12/09/17 6440      PT LONG TERM GOAL #1   Title  Patient able to perform ADLS with only intermittent pain in the low back of 1-2/10.    Status  Partially Met      PT LONG TERM GOAL #2   Title  Patient able to report no greater than 2/10 with standing after prolonged sitting.    Status  Partially Met            Plan - 12/09/17 0926    Clinical Impression Statement  Patient reports that she is moving better, but had some extra pain with doing housework, she reports that she tried what we had instructed her in but when she got tired she was just trying to get things done    PT Next Visit Plan  continue to work on strength    Consulted and Agree with Plan of Care  Patient       Patient will benefit from skilled therapeutic intervention in order to improve the following deficits and impairments:  Pain, Postural dysfunction, Decreased range of motion, Decreased strength, Impaired flexibility  Visit Diagnosis: Abnormal posture  Acute bilateral low back pain  without sciatica     Problem List Patient Active Problem List   Diagnosis Date Noted  . BMI 24.0-24.9, adult 11/01/2017  . Osteopenia 04/12/2017  . OSA on CPAP 11/27/2014  . IBS (irritable bowel syndrome) 11/11/2014  . Encounter for Medicare annual wellness exam 11/11/2014  . Medication management 04/12/2014  . Abnormal blood sugar 10/10/2013  . Mixed hyperlipidemia 10/08/2013  . CAD (coronary artery disease) 02/03/2013  . MVP (mitral valve prolapse) 02/03/2013  . Vitamin D deficiency   . GERD (gastroesophageal reflux disease)   . COPD (chronic obstructive pulmonary disease) ([place]) 02/11/2010  . Essential hypertension 01/28/2010  . RAYNAUDS SYNDROME 01/28/2010    Sumner Boast., PT 12/09/2017, 9:28 AM  [facility] [place], [place], [address] Phone: [phone]   Fax:  [phone]  Name: Jasmine Reeves MRN: [id number] Date of Birth: [birth year]

## 2017-12-14 ENCOUNTER — Ambulatory Visit: Payer: Medicare Other | Admitting: Physical Therapy

## 2017-12-14 ENCOUNTER — Encounter: Payer: Self-pay | Admitting: Physical Therapy

## 2017-12-14 DIAGNOSIS — M545 Low back pain, unspecified: Secondary | ICD-10-CM

## 2017-12-14 DIAGNOSIS — R293 Abnormal posture: Secondary | ICD-10-CM | POA: Diagnosis not present

## 2017-12-14 NOTE — Therapy (Signed)
[place], [place], [address] Phone: [phone]   Fax:  [phone]  Physical Therapy Treatment  Patient Details  Name: Jasmine Reeves MRN: [id number] Date of Birth: 04-05-1940 Referring Provider (PT): Dumonski   Encounter Date: 12/14/2017  PT End of Session - 12/14/17 1140    Visit Number  6    Date for PT Re-Evaluation  02/16/18    PT Start Time  1100    PT Stop Time  1155    PT Time Calculation (min)  55 min    Activity Tolerance  Patient tolerated treatment well    Behavior During Therapy  [facility] for tasks assessed/performed       Past Medical History:  Diagnosis Date  . COPD (chronic obstructive pulmonary disease) ([place])   . Fibromyalgia   . GERD (gastroesophageal reflux disease)   . IBS (irritable bowel syndrome)   . Scoliosis     Past Surgical History:  Procedure Laterality Date  . ABDOMINAL HYSTERECTOMY    . APPENDECTOMY    . CORONARY ANGIOPLASTY  12/11  . CORONARY ARTERY BYPASS GRAFT    . ESOPHAGOGASTRODUODENOSCOPY  07/21/12   polyps, Hiatal hernia Dr. Cristina Gong  . TONSILLECTOMY    . TUBAL LIGATION      There were no vitals filed for this visit.  Subjective Assessment - 12/14/17 1104    Subjective  Was out of town, drove to [place] and back and reports that she was surprised that she really did not have much more pain or difficulty    Currently in Pain?  Yes    Pain Score  2     Pain Location  Back    Pain Orientation  Left    Pain Descriptors / Indicators  Aching    Aggravating Factors   sitting, bending                       [facility] Adult PT Treatment/Exercise - 12/14/17 0001      Exercises   Exercises  Lumbar      Lumbar Exercises: Stretches   Passive Hamstring Stretch  3 reps;20 seconds;Right;Left    Lower Trunk Rotation  3 reps;10 seconds    Piriformis Stretch  Right;Left;3 reps;20 seconds      Lumbar Exercises: Aerobic   Elliptical  R=5 I=5  x 3 minutes      Lumbar Exercises: Machines for Strengthening   Cybex Knee Extension  5# 2x10    Cybex Knee Flexion  20# 2x10    Other Lumbar Machine Exercise  seated row 15#, lats 15# 2x10 each      Lumbar Exercises: Standing   Other Standing Lumbar Exercises  W backs, weighted ball overhead, weighted ball trunk roations, 6 # side bends, 2.5# hip extension and abduction      Lumbar Exercises: Supine   Ab Set  3 seconds;10 reps    Other Supine Lumbar Exercises  feet on ball K2C, trunk rotation, small bridges and isometric abs      Modalities   Modalities  Electrical Stimulation;Moist Heat      Moist Heat Therapy   Number Minutes Moist Heat  15 Minutes    Moist Heat Location  Lumbar Spine      Electrical Stimulation   Electrical Stimulation Location  lumbar spine    Electrical Stimulation Action  IFC    Electrical Stimulation Parameters  supine    Electrical Stimulation Goals  Pain               PT Short Term Goals - 11/28/17 0847      PT SHORT TERM GOAL #1   Title  Ind with inital HEP    Status  Partially Met        PT Long Term Goals - 12/14/17 1144      PT LONG TERM GOAL #1   Title  Patient able to perform ADLS with only intermittent pain in the low back of 1-2/10.    Status  Partially Met      PT LONG TERM GOAL #2   Title  Patient able to report no greater than 2/10 with standing after prolonged sitting.    Status  Partially Met      PT LONG TERM GOAL #3   Title  Pt to demo improved BLE strength to 4+/5 or better to ease ADLS    Status  Partially Met      PT LONG TERM GOAL #4   Title  Pt able to demonstrate and/or verbalize proper body mechanics to prevent further injury.    Status  Partially Met            Plan - 12/14/17 1140    Clinical Impression Statement  Started talking to patient about future, her doing HEP advanced or going to the gym.  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    PT Next Visit Plan  look at advanced HEP    Consulted and Agree with Plan of Care  Patient       Patient will benefit from skilled therapeutic intervention in order to improve the following deficits and impairments:  Pain, Postural dysfunction, Decreased range of motion, Decreased strength, Impaired flexibility  Visit Diagnosis: Abnormal posture  Acute bilateral low back pain without sciatica     Problem List Patient Active Problem List   Diagnosis Date Noted  . BMI 24.0-24.9, adult 11/01/2017  . Osteopenia 04/12/2017  . OSA on CPAP 11/27/2014  . IBS (irritable bowel syndrome) 11/11/2014  . Encounter for Medicare annual wellness exam 11/11/2014  . Medication management 04/12/2014  . Abnormal blood sugar 10/10/2013  . Mixed hyperlipidemia 10/08/2013  . CAD (coronary artery disease) 02/03/2013  . MVP (mitral valve prolapse) 02/03/2013  . Vitamin D deficiency   . GERD (gastroesophageal reflux disease)   . COPD (chronic obstructive pulmonary disease) ([place]) 02/11/2010  . Essential hypertension 01/28/2010  . RAYNAUDS SYNDROME 01/28/2010    Sumner Boast., PT 12/14/2017, 11:45 AM  [place], [place], [address] Phone: [phone]   Fax:  [phone]  Name: Jasmine Reeves MRN: [id number] Date of Birth: [birth year]

## 2017-12-16 ENCOUNTER — Ambulatory Visit: Payer: Medicare Other | Admitting: Physical Therapy

## 2017-12-16 ENCOUNTER — Encounter: Payer: Self-pay | Admitting: Physical Therapy

## 2017-12-16 DIAGNOSIS — R293 Abnormal posture: Secondary | ICD-10-CM

## 2017-12-16 DIAGNOSIS — M545 Low back pain, unspecified: Secondary | ICD-10-CM

## 2017-12-16 NOTE — Therapy (Signed)
Jasmine Reeves, [place], [address] Phone: [phone]   Fax:  [phone]  Physical Therapy Treatment  Patient Details  Name: Jasmine Reeves MRN: [id number] Date of Birth: Aug 24, 1940 Referring Provider (PT): Dumonski   Encounter Date: 12/16/2017  PT End of Session - 12/16/17 1151    Visit Number  7    Date for PT Re-Evaluation  02/16/18    PT Start Time  1100    PT Stop Time  1159    PT Time Calculation (min)  59 min    Activity Tolerance  Patient tolerated treatment well    Behavior During Therapy  [facility] for tasks assessed/performed       Past Medical History:  Diagnosis Date  . COPD (chronic obstructive pulmonary disease) ([place])   . Fibromyalgia   . GERD (gastroesophageal reflux disease)   . IBS (irritable bowel syndrome)   . Scoliosis     Past Surgical History:  Procedure Laterality Date  . ABDOMINAL HYSTERECTOMY    . APPENDECTOMY    . CORONARY ANGIOPLASTY  12/11  . CORONARY ARTERY BYPASS GRAFT    . ESOPHAGOGASTRODUODENOSCOPY  07/21/12   polyps, Hiatal hernia Dr. Cristina Gong  . TONSILLECTOMY    . TUBAL LIGATION      There were no vitals filed for this visit.  Subjective Assessment - 12/16/17 1115    Subjective  Patient reports tthat she had some increased soreness/pain with putting away dishes this AM.    Currently in Pain?  Yes    Pain Score  4     Pain Location  Back    Pain Orientation  Left    Aggravating Factors   bending                       [facility] Adult PT Treatment/Exercise - 12/16/17 0001      Ambulation/Gait   Gait Comments  2 laps brisk walk around the building, some SOB      Therapeutic Activites    ADL's  went over emptying dishwasher with min squat and putting dishes on cabinet, then golfer's lift and had her practice this with 8 items of 1-2 #, she did this without issue, also did foot prop and vacuum      Lumbar Exercises: Stretches   Piriformis  Stretch  Right;Left;3 reps;20 seconds      Lumbar Exercises: Machines for Strengthening   Cybex Knee Extension  5# 2x10    Cybex Knee Flexion  20# 2x10    Other Lumbar Machine Exercise  seated row 20#, lats 20# 2x10 each      Lumbar Exercises: Standing   Other Standing Lumbar Exercises  W backs, weighted ball overhead, weighted ball trunk roations, 6 # side bends, 2.5# hip extension and abduction      Lumbar Exercises: Supine   Ab Set  3 seconds;10 reps      Modalities   Modalities  Electrical Stimulation;Moist Heat      Moist Heat Therapy   Number Minutes Moist Heat  15 Minutes    Moist Heat Location  Lumbar Spine      Electrical Stimulation   Electrical Stimulation Location  lumbar spine    Electrical Stimulation Action  IFC    Electrical Stimulation Parameters  supine    Electrical Stimulation Goals  Pain               PT Short Term Goals -  11/28/17 0847      PT SHORT TERM GOAL #1   Title  Ind with inital HEP    Status  Partially Met        PT Long Term Goals - 12/14/17 1144      PT LONG TERM GOAL #1   Title  Patient able to perform ADLS with only intermittent pain in the low back of 1-2/10.    Status  Partially Met      PT LONG TERM GOAL #2   Title  Patient able to report no greater than 2/10 with standing after prolonged sitting.    Status  Partially Met      PT LONG TERM GOAL #3   Title  Pt to demo improved BLE strength to 4+/5 or better to ease ADLS    Status  Partially Met      PT LONG TERM GOAL #4   Title  Pt able to demonstrate and/or verbalize proper body mechanics to prevent further injury.    Status  Partially Met            Plan - 12/16/17 1151    Clinical Impression Statement  Patient with increased pain emptying hte dishwasher, she was able to understand and try the techniques I showed her and she was able to do without pain.  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 BMI 24.0-24.9, adult 11/01/2017  . Osteopenia 04/12/2017  . OSA on CPAP 11/27/2014  . IBS (irritable bowel syndrome) 11/11/2014  . Encounter for Medicare annual wellness exam 11/11/2014  . Medication management 04/12/2014  . Abnormal blood sugar 10/10/2013  . Mixed hyperlipidemia 10/08/2013  . CAD (coronary artery disease) 02/03/2013  . MVP (mitral valve prolapse) 02/03/2013  . Vitamin D deficiency   . GERD (gastroesophageal reflux disease)   . COPD (chronic obstructive pulmonary disease) ([place]) 02/11/2010  . Essential hypertension 01/28/2010  . RAYNAUDS SYNDROME 01/28/2010    Sumner Boast., PT 12/16/2017, 11:53 AM  [place], [place], [address] Phone: [phone]   Fax:  [phone]  Name: Jasmine Reeves MRN: [id number] Date of Birth: 1941-01-09

## 2017-12-20 ENCOUNTER — Other Ambulatory Visit: Payer: Self-pay | Admitting: Internal Medicine

## 2017-12-26 ENCOUNTER — Encounter: Payer: Self-pay | Admitting: Adult Health

## 2017-12-26 ENCOUNTER — Ambulatory Visit: Payer: Medicare Other | Admitting: Adult Health

## 2017-12-26 VITALS — BP 106/68 | HR 69 | Temp 97.5°F | Ht 63.5 in | Wt 134.0 lb

## 2017-12-26 DIAGNOSIS — J060 Acute laryngopharyngitis: Secondary | ICD-10-CM | POA: Diagnosis not present

## 2017-12-26 MED ORDER — AMOXICILLIN 500 MG PO TABS: 500.0000 mg | ORAL_TABLET | Freq: Two times a day (BID) | ORAL | 0 refills | Status: DC

## 2017-12-26 MED ORDER — PROMETHAZINE-DM 6.25-15 MG/5ML PO SYRP: 5.0000 mL | ORAL_SOLUTION | Freq: Four times a day (QID) | ORAL | 1 refills | Status: DC | PRN

## 2017-12-26 MED ORDER — PREDNISONE 20 MG PO TABS
ORAL_TABLET | ORAL | 0 refills | Status: DC
Start: 2017-12-26 — End: 2018-01-09

## 2017-12-26 NOTE — Patient Instructions (Signed)
Salt water gargles  Monitor symptoms closely, call with any new symptoms  ER for sudden severe symptoms     Pharyngitis Pharyngitis is redness, pain, and swelling (inflammation) of the throat (pharynx). It is a very common cause of sore throat. Pharyngitis can be caused by a bacteria, but it is usually caused by a virus. Most cases of pharyngitis get better on their own without treatment. What are the causes? This condition may be caused by:  Infection by viruses (viral). Viral pharyngitis spreads from person to person (is contagious) through coughing, sneezing, and sharing of personal items or utensils such as cups, forks, spoons, and toothbrushes.  Infection by bacteria (bacterial). Bacterial pharyngitis may be spread by touching the nose or face after coming in contact with the bacteria, or through more intimate contact, such as kissing.  Allergies. Allergies can cause buildup of mucus in the throat (post-nasal drip), leading to inflammation and irritation. Allergies can also cause blocked nasal passages, forcing breathing through the mouth, which dries and irritates the throat.  What increases the risk? You are more likely to develop this condition if:  You are 67-46 years old.  You are exposed to crowded environments such as daycare, school, or dormitory living.  You live in a cold climate.  You have a weakened disease-fighting (immune) system.  What are the signs or symptoms? Symptoms of this condition vary by the cause (viral, bacterial, or allergies) and can include:  Sore throat.  Fatigue.  Low-grade fever.  Headache.  Joint pain and muscle aches.  Skin rashes.  Swollen glands in the throat (lymph nodes).  Plaque-like film on the throat or tonsils. This is often a symptom of bacterial pharyngitis.  Vomiting.  Stuffy nose (nasal congestion).  Cough.  Red, itchy eyes (conjunctivitis).  Loss of appetite.  How is this diagnosed? This condition is often  diagnosed based on your medical history and a physical exam. Your health care provider will ask you questions about your illness and your symptoms. A swab of your throat may be done to check for bacteria (rapid strep test). Other lab tests may also be done, depending on the suspected cause, but these are rare. How is this treated? This condition usually gets better in 3-4 days without medicine. Bacterial pharyngitis may be treated with antibiotic medicines. Follow these instructions at home:  Take over-the-counter and prescription medicines only as told by your health care provider. ? If you were prescribed an antibiotic medicine, take it as told by your health care provider. Do not stop taking the antibiotic even if you start to feel better. ? Do not give children aspirin because of the association with Reye syndrome.  Drink enough water and fluids to keep your urine clear or pale yellow.  Get a lot of rest.  Gargle with a salt-water mixture 3-4 times a day or as needed. To make a salt-water mixture, completely dissolve -1 tsp of salt in 1 cup of warm water.  If your health care provider approves, you may use throat lozenges or sprays to soothe your throat. Contact a health care provider if:  You have large, tender lumps in your neck.  You have a rash.  You cough up green, yellow-brown, or bloody spit. Get help right away if:  Your neck becomes stiff.  You drool or are unable to swallow liquids.  You cannot drink or take medicines without vomiting.  You have severe pain that does not go away, even after you take medicine.  You  have trouble breathing, and it is not caused by a stuffy nose.  You have new pain and swelling in your joints such as the knees, ankles, wrists, or elbows. Summary  Pharyngitis is redness, pain, and swelling (inflammation) of the throat (pharynx).  While pharyngitis can be caused by a bacteria, the most common causes are viral.  Most cases of  pharyngitis get better on their own without treatment.  Bacterial pharyngitis is treated with antibiotic medicines. This information is not intended to replace advice given to you by your health care provider. Make sure you discuss any questions you have with your health care provider. Document Released: 02/15/2005 Document Revised: 03/23/2016 Document Reviewed: 03/23/2016 Elsevier Interactive Patient Education  [employer]2018 [employer]vier Inc[employer]

## 2017-12-26 NOTE — Progress Notes (Signed)
Assessment and Plan:  Jasmine Reeves was seen today for sore throat, cough and fever.  Diagnoses and all orders for this visit:  Acute laryngopharyngitis Suggested symptomatic OTC remedies - salt water gargles Will treat aggressively due to comorbidities and reports of fever 100+ at home  Follow up as needed, call with any worsening symptoms, present to ED for sudden/severe symptoms -     promethazine-dextromethorphan (PROMETHAZINE-DM) 6.25-15 MG/5ML syrup; Take 5 mLs by mouth 4 (four) times daily as needed for cough. -     predniSONE (DELTASONE) 20 MG tablet; 2 tablets daily for 3 days, 1 tablet daily for 4 days. -     amoxicillin (AMOXIL) 500 MG tablet; Take 1 tablet (500 mg total) by mouth 2 (two) times daily.  Further disposition pending results of labs. Discussed med's effects and SE's.   Over 15 minutes of exam, counseling, chart review, and critical decision making was performed.   Future Appointments  Date Time Provider Department Center  12/29/2017  2:45 PM Jasmine Reeves, PT [facility]  01/31/2018  2:40 PM Jasmine Bihari, MD [facility]  02/07/2018 10:30 AM Jasmine Cowboy, MD [facility] None  08/15/2018 10:00 AM Jasmine Cowboy, MD [facility] None    ------------------------------------------------------------------------------------------------------------------   HPI BP 106/68   Pulse 69   Temp (!) 97.5 F (36.4 C)   Ht 5' 3.5" (1.613 m)   Wt 134 lb (60.8 kg)   SpO2 98%   BMI 23.36 kg/m   77 y.o.female with COPD (by imaging only, typically asymptomatic), CAD, OSA on CPAP presents for evaluation of progressive sore throat with cough ongoing for 4-5 days, thought it was just allergies, but suddenly became worse 2 days ago, feeling fatigued, chills, fever up to 100 degrees that evening, then up to 100.9 last evening. She denies dyspnea, chest pain/pressure. She endorses minimal congestion. Feels this is mainly in her throat, endorses hoarseness.   She is  taking fexofenadine daily for allergies, flonase if needed, has been taking benzonatate leftover from previous script.   Denies N/V/D, headaches, blurry vision, dizziness. Appetite has been poor. She has been taking tylenol and took meloxican this AM. Felt fever break overnight.   Past Medical History:  Diagnosis Date  . COPD (chronic obstructive pulmonary disease) ([facility])   . Fibromyalgia   . GERD (gastroesophageal reflux disease)   . IBS (irritable bowel syndrome)   . Scoliosis      Allergies  Allergen Reactions  . Altace [Ramipril]     cough  . Azithromycin     palpitation  . Celebrex [Celecoxib]     Gi upset   . Hydrochlorothiazide     cramping  . 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 Prilosec [Omeprazole]     constipation    Current Outpatient Medications on File Prior to Visit  Medication Sig  . aspirin 81 MG tablet Take 81 mg by mouth daily.  Marland Kitchen CALCIUM CITRATE PO Take 500 mg by mouth daily.  . Cholecalciferol (VITAMIN D) 2000 UNITS tablet Take 2,000 Units by mouth 2 (two) times daily.  Marland Kitchen DILT-XR 180 MG 24 hr capsule TAKE 1 CAPSULE BY MOUTH DAILY  . fexofenadine (ALLEGRA) 180 MG tablet Take 180 mg by mouth daily.  . fluticasone (FLONASE) 50 MCG/ACT nasal spray SHAKE LIQUID AND USE 2 SPRAYS IN EACH NOSTRIL TWICE DAILY  . fluticasone (VERAMYST) 27.5 MCG/SPRAY nasal spray Place 2 sprays into the nose daily as needed for rhinitis.  . IRON  PO Take 50 mg by mouth daily.  Marland Kitchen labetalol (NORMODYNE) 100 MG tablet Take 1.5 tablets (150 mg total) by mouth at bedtime. (Patient taking differently: Take 100 mg by mouth at bedtime. )  . Magnesium 400 MG CAPS Take by mouth daily.  . meloxicam (MOBIC) 15 MG tablet TAKE 1/2 TO 1 TABLET BY MOUTH DAILY ON A FULL STOMACH FOR PAIN AND INFLAMMATION  . Multiple Vitamins-Minerals (MULTIVITAMIN WITH MINERALS) tablet Take 1 tablet by mouth daily.  . nitroGLYCERIN (NITROSTAT) 0.4 MG SL tablet Place 1 tablet (0.4 mg  total) under the tongue every 5 (five) minutes as needed for chest pain.  . pantoprazole (PROTONIX) 40 MG tablet TAKE 1 TABLET BY MOUTH TWICE DAILY 30 MINUTES BEFORE BREAKFAST AND DINNER  . Probiotic Product (PROBIOTIC-10 PO) Take 1 capsule by mouth.  . ranitidine (ZANTAC) 150 MG tablet TAKE 1 TABLET(150 MG) BY MOUTH TWICE DAILY  . vitamin C (ASCORBIC ACID) 500 MG tablet Take 500 mg by mouth daily.  Marland Kitchen VYTORIN 10-20 MG tablet TAKE 1 TABLET BY MOUTH EVERY DAY   No current facility-administered medications on file prior to visit.     ROS: all negative except above.   Physical Exam:  BP 106/68   Pulse 69   Temp (!) 97.5 F (36.4 C)   Ht 5' 3.5" (1.613 m)   Wt 134 lb (60.8 kg)   SpO2 98%   BMI 23.36 kg/m   General Appearance: Well nourished, in no apparent distress. Eyes: PERRLA, conjunctiva no swelling or erythema Sinuses: No Frontal/maxillary tenderness ENT/Mouth: Ext aud canals clear, TMs without erythema, bulging. Posterior pharynx poorly visualized due to anatomy; no visible erythema, swelling, or exudate on post pharynx, tonsils not visible, uvula midline without notable erythema. Hearing normal.  Neck: Supple, thyroid normal.  Respiratory: Respiratory effort normal, BS equal bilaterally without rales, rhonchi, wheezing or stridor.  Cardio: RRR with 2/6 systolic murmur consistent with baseline. Brisk peripheral pulses without edema.  Abdomen: Soft, + BS.  Non tender, no guarding, rebound. Lymphatics: Non tender without lymphadenopathy.  Musculoskeletal: Symmetrical strength, normal gait.  Skin: Warm, dry without rashes, lesions, ecchymosis.  Neuro: Cranial nerves intact. Normal muscle tone, no cerebellar symptoms. Sensation intact.  Psych: Awake and oriented X 3, normal affect, Insight and Judgment appropriate.     Jasmine Maker, NP 12:54 PM [facility]

## 2017-12-28 ENCOUNTER — Ambulatory Visit: Payer: Medicare Other | Admitting: Physical Therapy

## 2017-12-29 ENCOUNTER — Ambulatory Visit: Payer: Medicare Other | Admitting: Physical Therapy

## 2018-01-04 ENCOUNTER — Encounter: Payer: Self-pay | Admitting: Physical Therapy

## 2018-01-04 ENCOUNTER — Ambulatory Visit: Payer: Medicare Other | Attending: Orthopedic Surgery | Admitting: Physical Therapy

## 2018-01-04 DIAGNOSIS — M545 Low back pain, unspecified: Secondary | ICD-10-CM

## 2018-01-04 DIAGNOSIS — R293 Abnormal posture: Secondary | ICD-10-CM

## 2018-01-04 NOTE — Therapy (Signed)
[place], [place], [address] Phone: [phone]   Fax:  [phone]  Physical Therapy Treatment  Patient Details  Name: Jasmine Reeves MRN: [id number] Date of Birth: [birth year] Referring Provider (PT): Dumonski   Encounter Date: 01/04/2018  PT End of Session - 01/04/18 1222    Visit Number  8    Date for PT Re-Evaluation  02/16/18    PT Start Time  1143    PT Stop Time  1237    PT Time Calculation (min)  54 min    Activity Tolerance  Patient tolerated treatment well    Behavior During Therapy  [facility] for tasks assessed/performed       Past Medical History:  Diagnosis Date  . COPD (chronic obstructive pulmonary disease) ([place])   . Fibromyalgia   . GERD (gastroesophageal reflux disease)   . IBS (irritable bowel syndrome)   . Scoliosis     Past Surgical History:  Procedure Laterality Date  . ABDOMINAL HYSTERECTOMY    . APPENDECTOMY    . CORONARY ANGIOPLASTY  12/11  . CORONARY ARTERY BYPASS GRAFT    . ESOPHAGOGASTRODUODENOSCOPY  07/21/12   polyps, Hiatal hernia Dr. Cristina Gong  . TONSILLECTOMY    . TUBAL LIGATION      There were no vitals filed for this visit.  Subjective Assessment - 01/04/18 1146    Subjective  "I think my back is doing better" Pt reports that she has not tried any of the new techniques because she has been sick.     Pertinent History  scoliosis, low back pain, OA, COPD, prediabetic, Raynaud's, OA R foot, bursitis R shoulder, Open heart surgery 18 yrs ago    Currently in Pain?  No/denies    Pain Score  0-No pain                       [facility] Adult PT Treatment/Exercise - 01/04/18 0001      Exercises   Exercises  Lumbar      Lumbar Exercises: Aerobic   Nustep  level 4 x 6 minutes      Lumbar Exercises: Machines for Strengthening   Cybex Knee Extension  5# 2x10    Cybex Knee Flexion  25# 2x10    Other Lumbar Machine Exercise  seated row 25#, lats 20#  2x10 each      Lumbar Exercises: Standing   Other Standing Lumbar Exercises  horiz abd yellow 2x10     Other Standing Lumbar Exercises  weighted ball overhead, trunk rotatons, W backs       Lumbar Exercises: Seated   Other Seated Lumbar Exercises  ab sets 2x10 3''                PT Short Term Goals - 11/28/17 0847      PT SHORT TERM GOAL #1   Title  Ind with inital HEP    Status  Partially Met        PT Long Term Goals - 01/04/18 1223      PT LONG TERM GOAL #1   Title  Patient able to perform ADLS with only intermittent pain in the low back of 1-2/10.    Status  Achieved      PT LONG TERM GOAL #2   Title  Patient able to report no greater than 2/10 with standing after prolonged sitting.    Status  Partially Met  PT LONG TERM GOAL #3   Title  Pt to demo improved BLE strength to 4+/5 or better to ease ADLS    Status  Partially Met      PT LONG TERM GOAL #4   Title  Pt able to demonstrate and/or verbalize proper body mechanics to prevent further injury.    Status  Achieved            Plan - 01/04/18 1223    Clinical Impression Statement  Pt enters clinic reporting no pain. She stated being able to serve at [employer] without issue. No issues with today's exercises. She reports being pleased with her current functional status.     Rehab Potential  Good    PT Frequency  2x / week    PT Duration  8 weeks    PT Treatment/Interventions  ADLs/Self Care Home Management;Cryotherapy;Electrical Stimulation;Moist Heat;Traction;Therapeutic exercise;Neuromuscular re-education;Patient/family education;Manual techniques;Dry needling;Taping    PT Next Visit Plan  D/C PT       Patient will benefit from skilled therapeutic intervention in order to improve the following deficits and impairments:  Pain, Postural dysfunction, Decreased range of motion, Decreased strength, Impaired flexibility  Visit Diagnosis: Abnormal posture  Acute bilateral low back pain without  sciatica     Problem List Patient Active Problem List   Diagnosis Date Noted  . BMI 24.0-24.9, adult 11/01/2017  . Osteopenia 04/12/2017  . OSA on CPAP 11/27/2014  . IBS (irritable bowel syndrome) 11/11/2014  . Encounter for Medicare annual wellness exam 11/11/2014  . Medication management 04/12/2014  . Abnormal blood sugar 10/10/2013  . Mixed hyperlipidemia 10/08/2013  . CAD (coronary artery disease) 02/03/2013  . MVP (mitral valve prolapse) 02/03/2013  . Vitamin D deficiency   . GERD (gastroesophageal reflux disease)   . COPD (chronic obstructive pulmonary disease) ([place]) 02/11/2010  . Essential hypertension 01/28/2010  . RAYNAUDS SYNDROME 01/28/2010   PHYSICAL THERAPY DISCHARGE SUMMARY  Visits from Start of Care: 8 Plan: Patient agrees to discharge.  Patient goals were partially met. Patient is being discharged due to being pleased with the current functional level.  ?????      Scot Jun, PTA 01/04/2018, 12:25 PM  [place], [place], [address] Phone: [phone]   Fax:  [phone]  Name: Jasmine Reeves MRN: [id number] Date of Birth: 1940/04/30

## 2018-01-09 ENCOUNTER — Telehealth: Payer: Self-pay

## 2018-01-09 ENCOUNTER — Other Ambulatory Visit: Payer: Self-pay | Admitting: Adult Health

## 2018-01-09 MED ORDER — PREDNISONE 20 MG PO TABS: ORAL_TABLET | ORAL | 0 refills | Status: DC

## 2018-01-09 MED ORDER — LEVOFLOXACIN 750 MG PO TABS
750.0000 mg | ORAL_TABLET | Freq: Every day | ORAL | 0 refills | Status: AC
Start: 2018-01-09 — End: 2018-01-14

## 2018-01-09 NOTE — Telephone Encounter (Signed)
Patient notified

## 2018-01-09 NOTE — Telephone Encounter (Signed)
Sinus and chest congestion with cough. States that she started to feel better but then symptoms returned. Feels the same as she did when she was seen here. Is there anything else she can take?

## 2018-01-10 NOTE — Telephone Encounter (Signed)
Patient is concerned about taking the Levaquin and all the side effects that comes with it.

## 2018-01-10 NOTE — Telephone Encounter (Signed)
Prednisone seems to be working and is feeling better with just taking that. She is choosing to just stick with the Prednisone for now. Feels that she doesn't need an antibiotic right now.

## 2018-01-13 ENCOUNTER — Other Ambulatory Visit: Payer: Self-pay | Admitting: Internal Medicine

## 2018-01-17 ENCOUNTER — Encounter: Payer: Self-pay | Admitting: Adult Health Nurse Practitioner

## 2018-01-17 ENCOUNTER — Ambulatory Visit: Payer: Medicare Other | Admitting: Adult Health Nurse Practitioner

## 2018-01-17 VITALS — BP 110/64 | HR 88 | Temp 98.2°F | Ht 63.5 in | Wt 132.8 lb

## 2018-01-17 DIAGNOSIS — R0981 Nasal congestion: Secondary | ICD-10-CM

## 2018-01-17 DIAGNOSIS — R05 Cough: Secondary | ICD-10-CM

## 2018-01-17 DIAGNOSIS — H1033 Unspecified acute conjunctivitis, bilateral: Secondary | ICD-10-CM | POA: Diagnosis not present

## 2018-01-17 DIAGNOSIS — R040 Epistaxis: Secondary | ICD-10-CM

## 2018-01-17 DIAGNOSIS — R059 Cough, unspecified: Secondary | ICD-10-CM

## 2018-01-17 MED ORDER — POLYMYXIN B-TRIMETHOPRIM 10000-0.1 UNIT/ML-% OP SOLN: 1.0000 [drp] | OPHTHALMIC | 0 refills | Status: DC

## 2018-01-17 NOTE — Progress Notes (Signed)
Assessment and Plan:  Marylouise StacksWilma was seen today for acute visit.  Diagnoses and all orders for this visit:  Acute bacterial conjunctivitis of both eyes Discussed frequent hand washing to prevent spread Likely from  -     trimethoprim-polymyxin b (POLYTRIM) ophthalmic solution; Place 1 drop into both eyes every 4 (four) hours. For 10 days. Dispense quantity sufficient.  Epistaxis Related to flonase use? Stop flonase for at least one week Discussed use of saline nasal pray  Complaint of nasal congestion Take OTC Mucinex D for nasal congestion Consider Neti Pot BID, use bottled or distilled water, NOT TAP WATER  Cough Likely from allergies Discussed switching from allegra to Zyrtec Take medication at night, may make sleepy Increase fluid intake   Patient to call or return with new or worsening symptoms   Further disposition pending results of labs. Discussed med's effects and SE's.   Over 30 minutes of exam, counseling, chart review, and critical decision making was performed.   Future Appointments  Date Time Provider Department Center  01/31/2018  2:40 PM Lennette BihariKelly, Thomas A, MD [facility]  02/07/2018 10:30 AM [name], [name], MD [facility] None  08/15/2018 10:00 AM [name], [name], MD [facility] None    ------------------------------------------------------------------------------------------------------------------   HPI 77 y.o.female presents for  Antibiotic and prednisone three weeks ago.  She has finished this and reports that her symptoms have returned.  Reports that she took a second round of prednisone.  Her eyes have been bothering her the most.  Reports it started with redness to her right eye with discharge.  Now bilateral eye are red with discharge and stuck together in the morning.  She has been using warm wash clothes in the morning.  Reports that her nose has been very dry and crusty with some bloody noses.  She is also having some sinus tenderness. Reports  that she also has a congested cough and has been taking mucinex DM for this.  Reports it is non productive and mostly at night.  Denies any fever, shortness of breath, change in vision, otalgia or dizziness.  Past Medical History:  Diagnosis Date  . COPD (chronic obstructive pulmonary disease) (HCC)   . Fibromyalgia   . GERD (gastroesophageal reflux disease)   . IBS (irritable bowel syndrome)   . Scoliosis      Allergies  Allergen Reactions  . Altace [Ramipril]     cough  . Azithromycin     palpitation  . Celebrex [Celecoxib]     Gi upset   . Hydrochlorothiazide     cramping  . 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 Prilosec [Omeprazole]     constipation    Current Outpatient Medications on File Prior to Visit  Medication Sig  . aspirin 81 MG tablet Take 81 mg by mouth daily.  Marland Kitchen. CALCIUM CITRATE PO Take 500 mg by mouth daily.  . Cholecalciferol (VITAMIN D) 2000 UNITS tablet Take 2,000 Units by mouth 2 (two) times daily.  Marland Kitchen. DILT-XR 180 MG 24 hr capsule TAKE 1 CAPSULE BY MOUTH DAILY  . fexofenadine (ALLEGRA) 180 MG tablet Take 180 mg by mouth daily.  . fluticasone (FLONASE) 50 MCG/ACT nasal spray SHAKE LIQUID AND USE 2 SPRAYS IN EACH NOSTRIL TWICE DAILY  . fluticasone (VERAMYST) 27.5 MCG/SPRAY nasal spray Place 2 sprays into the nose daily as needed for rhinitis.  . IRON PO Take 50 mg by mouth daily.  Marland Kitchen. labetalol (NORMODYNE) 100 MG tablet TAKE 1 AND 1/2  TABLETS BY MOUTH TWICE DAILY  . Magnesium 400 MG CAPS Take by mouth daily.  . meloxicam (MOBIC) 15 MG tablet TAKE 1/2 TO 1 TABLET BY MOUTH DAILY ON A FULL STOMACH FOR PAIN AND INFLAMMATION  . Multiple Vitamins-Minerals (MULTIVITAMIN WITH MINERALS) tablet Take 1 tablet by mouth daily.  . nitroGLYCERIN (NITROSTAT) 0.4 MG SL tablet Place 1 tablet (0.4 mg total) under the tongue every 5 (five) minutes as needed for chest pain.  . pantoprazole (PROTONIX) 40 MG tablet TAKE 1 TABLET BY MOUTH TWICE DAILY  30 MINUTES BEFORE BREAKFAST AND DINNER  . Probiotic Product (PROBIOTIC-10 PO) Take 1 capsule by mouth.  . promethazine-dextromethorphan (PROMETHAZINE-DM) 6.25-15 MG/5ML syrup Take 5 mLs by mouth 4 (four) times daily as needed for cough.  . ranitidine (ZANTAC) 150 MG tablet TAKE 1 TABLET(150 MG) BY MOUTH TWICE DAILY  . vitamin C (ASCORBIC ACID) 500 MG tablet Take 500 mg by mouth daily.  Marland Kitchen VYTORIN 10-20 MG tablet TAKE 1 TABLET BY MOUTH EVERY DAY   No current facility-administered medications on file prior to visit.     ROS: Review of Systems  Constitutional: Negative for chills, diaphoresis, fever, malaise/fatigue and weight loss.  HENT: Positive for congestion, nosebleeds and sinus pain. Negative for ear discharge, ear pain, hearing loss, sore throat and tinnitus.   Eyes: Positive for discharge and redness. Negative for blurred vision, double vision, photophobia and pain.  Respiratory: Positive for cough. Negative for hemoptysis, sputum production, shortness of breath, wheezing and stridor.   Cardiovascular: Negative for chest pain, palpitations, orthopnea, claudication, leg swelling and PND.  Gastrointestinal: Negative for abdominal pain, blood in stool, constipation, diarrhea, heartburn, melena, nausea and vomiting.  Skin: Negative for itching and rash.  Neurological: Negative for dizziness, tingling, tremors, sensory change, speech change, focal weakness, seizures, loss of consciousness, weakness and headaches.    Physical Exam:  BP 110/64   Pulse 88   Temp 98.2 F (36.8 C)   Ht 5' 3.5" (1.613 m)   Wt 132 lb 12.8 oz (60.2 kg)   SpO2 97%   BMI 23.16 kg/m   General Appearance: Well nourished, in no apparent distress. Eyes: PERRLA, EOMs, conjunctiva no swelling, erythema noted bilaterally with crusted discharge around bilateral eyes. Sinuses: positive frontal/maxillary tenderness ENT/Mouth: Ext aud canals clear, TMs without erythema, bulging. No erythema, swelling, or exudate on  post pharynx.  Tonsils not swollen or erythematous. Hearing normal.  Neck: Supple, thyroid normal.  Respiratory: Respiratory effort normal, BS equal bilaterally without rales, rhonchi, wheezing or stridor.  Cardio: RRR with no MRGs. Brisk peripheral pulses without edema.  Abdomen: Soft, + BS.  Non tender, no guarding, rebound, hernias, masses. Lymphatics: Non tender without lymphadenopathy.  Musculoskeletal: Full ROM, 5/5 strength, normal gait.  Skin: Warm, dry without rashes, lesions, ecchymosis.  Neuro: Cranial nerves intact. Normal muscle tone, no cerebellar symptoms. Sensation intact.  Psych: Awake and oriented X 3, normal affect, Insight and Judgment appropriate.     Elder Negus, NP 9:45 PM [facility]

## 2018-01-17 NOTE — Patient Instructions (Addendum)
STOP Flonase for one week Use Saline mist, can get at any pharmacy  Will send in Polymixin eye drops for both eyes, to your pharmacy  Take Mucinex D samples every 6 hours for nasal congestions  Consider switching from Allegra to Zyrtec, take at night might make you sleepy.     Bacterial Conjunctivitis Bacterial conjunctivitis is an infection of your conjunctiva. This is the clear membrane that covers the white part of your eye and the inner surface of your eyelid. This condition can make your eye:  Red or pink.  Itchy.  This condition is caused by bacteria. This condition spreads very easily from person to person (is contagious) and from one eye to the other eye. Follow these instructions at home: Medicines  Take or apply your antibiotic medicine as told by your doctor. Do not stop taking or applying the antibiotic even if you start to feel better.  Take or apply over-the-counter and prescription medicines only as told by your doctor.  Do not touch your eyelid with the eye drop bottle or the ointment tube. Managing discomfort  Wipe any fluid from your eye with a warm, wet washcloth or a cotton ball.  Place a cool, clean washcloth on your eye. Do this for 10-20 minutes, 3-4 times per day. General instructions  Do not wear contact lenses until the irritation is gone. Wear glasses until your doctor says it is okay to wear contacts.  Do not wear eye makeup until your symptoms are gone. Throw away any old makeup.  Change or wash your pillowcase every day.  Do not share towels or washcloths with anyone.  Wash your hands often with soap and water. Use paper towels to dry your hands.  Do not touch or rub your eyes.  Do not drive or use heavy machinery if your vision is blurry. Contact a doctor if:  You have a fever.  Your symptoms do not get better after 10 days. Get help right away if:  You have a fever and your symptoms suddenly get worse.  You have very bad pain  when you move your eye.  Your face: ? Hurts. ? Is red. ? Is swollen.  You have sudden loss of vision. This information is not intended to replace advice given to you by your health care provider. Make sure you discuss any questions you have with your health care provider. Document Released: 11/25/2007 Document Revised: 07/24/2015 Document Reviewed: 11/28/2014 Elsevier Interactive Patient Education  [employer]2018 Elsevier Inc.

## 2018-01-19 ENCOUNTER — Encounter: Payer: Self-pay | Admitting: Adult Health Nurse Practitioner

## 2018-01-31 ENCOUNTER — Ambulatory Visit: Payer: Medicare Other | Admitting: Cardiovascular Disease

## 2018-02-01 ENCOUNTER — Encounter: Payer: Self-pay | Admitting: Adult Health

## 2018-02-01 ENCOUNTER — Ambulatory Visit: Payer: Medicare Other | Admitting: Adult Health

## 2018-02-01 VITALS — BP 122/68 | HR 64 | Temp 97.3°F | Ht 63.5 in | Wt 134.6 lb

## 2018-02-01 DIAGNOSIS — R21 Rash and other nonspecific skin eruption: Secondary | ICD-10-CM

## 2018-02-01 MED ORDER — ACYCLOVIR 400 MG PO TABS
ORAL_TABLET | ORAL | 0 refills | Status: AC
Start: 2018-02-01 — End: 2018-02-11

## 2018-02-01 MED ORDER — TRIAMCINOLONE ACETONIDE 0.1 % EX OINT: 1.0000 "application " | TOPICAL_OINTMENT | Freq: Two times a day (BID) | CUTANEOUS | 1 refills | Status: DC

## 2018-02-01 NOTE — Progress Notes (Signed)
Assessment and Plan:  Jasmine Reeves was seen today for herpes zoster.  Diagnoses and all orders for this visit:  Rash of back localised distribution, has reported tingling locally x 2 weeks, rash itself non-specific for zoster but after discussion will proceed with antiviral as cannot rule out. She declines lyrica. Will also try topical steroid.  Discussed precautions with shingles, information provided, call if vesicles appear, follow up if not improving -     acyclovir (ZOVIRAX) 400 MG tablet; Take 1 pill daily with food 3 times a day -     triamcinolone ointment (KENALOG) 0.1 %; Apply 1 application topically 2 (two) times daily.  Further disposition pending results of labs. Discussed med's effects and SE's.   Over 15 minutes of exam, counseling, chart review, and critical decision making was performed.   Future Appointments  Date Time Provider Department Center  02/01/2018  3:30 PM Judd Gaudierorbett, Keila Turan, NP [facility] None  02/02/2018 11:00 AM Azalee CourseMeng, Hao, [place] [facility]  02/07/2018 10:30 AM [name], [name], MD [facility] None  08/15/2018 10:00 AM [name], [name], MD [facility] None    ------------------------------------------------------------------------------------------------------------------  HPI Jasmine Reeves presents for evaluation of possible shingles rash; she reports she has had left hip tingling/burning that is ongoing for 2 weeks, she had associated with PT with spinal stimulation that she had prior, but then she developed a erythematous rash to her R hip yesterday. She has no vesicles, no discharge or distinct lesions. She has tried OTC anti-itch cream which caused localized burning.   She reports hx of shingles treated by acyclovir 2 years ago.   She has had the zostavax   Past Medical History:  Diagnosis Date  . COPD (chronic obstructive pulmonary disease) (HCC)   . Fibromyalgia   . GERD (gastroesophageal reflux disease)   . IBS (irritable bowel syndrome)   .  Scoliosis      Allergies  Allergen Reactions  . Altace [Ramipril]     cough  . Azithromycin     palpitation  . Celebrex [Celecoxib]     Gi upset   . Hydrochlorothiazide     cramping  . 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 Prilosec [Omeprazole]     constipation    Current Outpatient Medications on File Prior to Visit  Medication Sig  . aspirin 81 MG tablet Take 81 mg by mouth daily.  Marland Kitchen. CALCIUM CITRATE PO Take 500 mg by mouth daily.  . Cholecalciferol (VITAMIN D) 2000 UNITS tablet Take 2,000 Units by mouth 2 (two) times daily.  Marland Kitchen. DILT-XR 180 MG 24 hr capsule TAKE 1 CAPSULE BY MOUTH DAILY  . fexofenadine (ALLEGRA) 180 MG tablet Take 180 mg by mouth daily.  . fluticasone (FLONASE) 50 MCG/ACT nasal spray SHAKE LIQUID AND USE 2 SPRAYS IN EACH NOSTRIL TWICE DAILY  . fluticasone (VERAMYST) 27.5 MCG/SPRAY nasal spray Place 2 sprays into the nose daily as needed for rhinitis.  . IRON PO Take 50 mg by mouth daily.  Marland Kitchen. labetalol (NORMODYNE) 100 MG tablet TAKE 1 AND 1/2 TABLETS BY MOUTH TWICE DAILY  . Magnesium 400 MG CAPS Take by mouth daily.  . meloxicam (MOBIC) 15 MG tablet TAKE 1/2 TO 1 TABLET BY MOUTH DAILY ON A FULL STOMACH FOR PAIN AND INFLAMMATION  . Multiple Vitamins-Minerals (MULTIVITAMIN WITH MINERALS) tablet Take 1 tablet by mouth daily.  . nitroGLYCERIN (NITROSTAT) 0.4 MG SL tablet Place 1 tablet (0.4 mg total) under the tongue every 5 (five) minutes as needed for  chest pain.  . pantoprazole (PROTONIX) 40 MG tablet TAKE 1 TABLET BY MOUTH TWICE DAILY 30 MINUTES BEFORE BREAKFAST AND DINNER  . Probiotic Product (PROBIOTIC-10 PO) Take 1 capsule by mouth.  . promethazine-dextromethorphan (PROMETHAZINE-DM) 6.25-15 MG/5ML syrup Take 5 mLs by mouth 4 (four) times daily as needed for cough.  . ranitidine (ZANTAC) 150 MG tablet TAKE 1 TABLET(150 MG) BY MOUTH TWICE DAILY  . trimethoprim-polymyxin b (POLYTRIM) ophthalmic solution Place 1 drop into both eyes  every 4 (four) hours. For 10 days. Dispense quantity sufficient.  . vitamin C (ASCORBIC ACID) 500 MG tablet Take 500 mg by mouth daily.  Marland Kitchen VYTORIN 10-20 MG tablet TAKE 1 TABLET BY MOUTH EVERY DAY   No current facility-administered medications on file prior to visit.     ROS: all negative except above.   Physical Exam:  There were no vitals taken for this visit.  General Appearance: Well nourished, in no apparent distress. Eyes: conjunctiva no swelling or erythema ENT/Mouth: Hearing normal.  Neck: Supple Respiratory: Respiratory effort normal, BS equal bilaterally without rales, rhonchi, wheezing or stridor.  Cardio: RRR with no MRGs. Brisk peripheral pulses without edema.  Abdomen: Soft, + BS.  Non tender Lymphatics: Non tender without lymphadenopathy.  Musculoskeletal: normal gait.  Skin: Warm, dry without lesions, ecchymosis, she has erythematous rash localized to right lower back/hip, ? Linear distribution though without distict lesions/vesicles or any dischage.  Neuro: Normal muscle tone, Sensation intact.  Psych: Awake and oriented X 3, normal affect, Insight and Judgment appropriate.     Dan Maker, NP 3:07 PM [facility]

## 2018-02-01 NOTE — Patient Instructions (Signed)
Shingles Shingles, which is also known as herpes zoster, is an infection that causes a painful skin rash and fluid-filled blisters. Shingles is not related to genital herpes, which is a sexually transmitted infection. Shingles only develops in people who:  Have had chickenpox.  Have received the chickenpox vaccine. (This is rare.)  What are the causes? Shingles is caused by varicella-zoster virus (VZV). This is the same virus that causes chickenpox. After exposure to VZV, the virus stays in the body in an inactive (dormant) state. Shingles develops if the virus reactivates. This can happen many years after the initial exposure to VZV. It is not known what causes this virus to reactivate. What increases the risk? People who have had chickenpox or received the chickenpox vaccine are at risk for shingles. Infection is more common in people who:  Are older than age 50.  Have a weakened defense (immune) system, such as those with HIV, AIDS, or cancer.  Are taking medicines that weaken the immune system, such as transplant medicines.  Are under great stress.  What are the signs or symptoms? Early symptoms of this condition include itching, tingling, and pain in an area on your skin. Pain may be described as burning, stabbing, or throbbing. A few days or weeks after symptoms start, a painful red rash appears, usually on one side of the body in a bandlike or beltlike pattern. The rash eventually turns into fluid-filled blisters that break open, scab over, and dry up in about 2-3 weeks. At any time during the infection, you may also develop:  A fever.  Chills.  A headache.  An upset stomach.  How is this diagnosed? This condition is diagnosed with a skin exam. Sometimes, skin or fluid samples are taken from the blisters before a diagnosis is made. These samples are examined under a microscope or sent to a lab for testing. How is this treated? There is no specific cure for this condition.  Your health care provider will probably prescribe medicines to help you manage pain, recover more quickly, and avoid long-term problems. Medicines may include:  Antiviral drugs.  Anti-inflammatory drugs.  Pain medicines.  If the area involved is on your face, you may be referred to a specialist, such as an eye doctor (ophthalmologist) or an ear, nose, and throat (ENT) doctor to help you avoid eye problems, chronic pain, or disability. Follow these instructions at home: Medicines  Take medicines only as directed by your health care provider.  Apply an anti-itch or numbing cream to the affected area as directed by your health care provider. Blister and Rash Care  Take a cool bath or apply cool compresses to the area of the rash or blisters as directed by your health care provider. This may help with pain and itching.  Keep your rash covered with a loose bandage (dressing). Wear loose-fitting clothing to help ease the pain of material rubbing against the rash.  Keep your rash and blisters clean with mild soap and cool water or as directed by your health care provider.  Check your rash every day for signs of infection. These include redness, swelling, and pain that lasts or increases.  Do not pick your blisters.  Do not scratch your rash. General instructions  Rest as directed by your health care provider.  Keep all follow-up visits as directed by your health care provider. This is important.  Until your blisters scab over, your infection can cause chickenpox in people who have never had it or been vaccinated   against it. To prevent this from happening, avoid contact with other people, especially: ? Babies. ? Pregnant women. ? Children who have eczema. ? Elderly people who have transplants. ? People who have chronic illnesses, such as leukemia or AIDS. Contact a health care provider if:  Your pain is not relieved with prescribed medicines.  Your pain does not get better after  the rash heals.  Your rash looks infected. Signs of infection include redness, swelling, and pain that lasts or increases. Get help right away if:  The rash is on your face or nose.  You have facial pain, pain around your eye area, or loss of feeling on one side of your face.  You have ear pain or you have ringing in your ear.  You have loss of taste.  Your condition gets worse. This information is not intended to replace advice given to you by your health care provider. Make sure you discuss any questions you have with your health care provider. Document Released: 02/15/2005 Document Revised: 10/12/2015 Document Reviewed: 12/27/2013 Elsevier Interactive Patient Education  2018 [employer]   Acyclovir tablets or capsules What is this medicine? ACYCLOVIR (ay SYE kloe veer) is an antiviral medicine. It is used to treat or prevent infections caused by certain kinds of viruses. Examples of these infections include herpes and shingles. This medicine will not cure herpes. This medicine may be used for other purposes; ask your health care provider or pharmacist if you have questions. COMMON BRAND NAME(S): Zovirax What should I tell my health care provider before I take this medicine? They need to know if you have any of these conditions: -kidney disease -an unusual or allergic reaction to acyclovir, ganciclovir, valacyclovir, other medicines, foods, dyes, or preservatives -pregnant or trying to get pregnant -breast-feeding How should I use this medicine? Take this medicine by mouth with a glass of water. Follow the directions on the prescription label. You can take it with or without food. Take your medicine at regular intervals. Do not take your medicine more often than directed. Take all of your medicine as directed even if you think your are better. Do not skip doses or stop your medicine early. Talk to your pediatrician regarding the use of this medicine in children. While this drug  may be prescribed for selected conditions, precautions do apply. Overdosage: If you think you have taken too much of this medicine contact a poison control center or emergency room at once. NOTE: This medicine is only for you. Do not share this medicine with others. What if I miss a dose? If you miss a dose, take it as soon as you can. If it is almost time for your next dose, take only that dose. Do not take double or extra doses. What may interact with this medicine? -probenecid This list may not describe all possible interactions. Give your health care provider a list of all the medicines, herbs, non-prescription drugs, or dietary supplements you use. Also tell them if you smoke, drink alcohol, or use illegal drugs. Some items may interact with your medicine. What should I watch for while using this medicine? Tell your doctor or health care professional if your symptoms do not improve. This medicine works best when started very early in the course of an infection. Begin treatment at the first signs of infection. Drink 6 to 8 glasses of water or fluids every day while you are taking this medicine. This will help prevent side effects. You can still pass chickenpox, shingles, or  herpes to another person even while you are taking this medicine. Avoid contact with others as directed. Genital herpes is a sexually transmitted disease. Talk to your doctor about how to stop the spread of infection. What side effects may I notice from receiving this medicine? Side effects that you should report to your doctor or health care professional as soon as possible: -allergic reactions like skin rash, itching or hives, swelling of the face, lips, or tongue -chest pain -confusion, hallucinations, tremor -dark urine -increased sensitivity to the sun -redness, blistering, peeling or loosening of the skin, including inside the mouth -seizures -trouble passing urine or change in the amount of urine -unusual bleeding  or bruising, or pinpoint red spots on the skin -unusually weak or tired -yellowing of the eyes or skin Side effects that usually do not require medical attention (report to your doctor or health care professional if they continue or are bothersome): -diarrhea -fever -headache -nausea, vomiting -stomach upset This list may not describe all possible side effects. Call your doctor for medical advice about side effects. You may report side effects to FDA at 1-800-FDA-1088. Where should I keep my medicine? Keep out of the reach of children. Store at room temperature between 15 and 25 degrees C (59 and 77 degrees F). Throw away any unused medicine after the expiration date. NOTE: This sheet is a summary. It may not cover all possible information. If you have questions about this medicine, talk to your doctor, pharmacist, or health care provider.  2018 Elsevier/Gold Standard (2007-05-03 13:15:46)

## 2018-02-02 ENCOUNTER — Encounter: Payer: Self-pay | Admitting: Physician Assistant

## 2018-02-02 ENCOUNTER — Ambulatory Visit: Payer: Medicare Other | Admitting: Physician Assistant

## 2018-02-02 VITALS — BP 112/72 | HR 69 | Ht 65.0 in | Wt 134.6 lb

## 2018-02-02 DIAGNOSIS — I10 Essential (primary) hypertension: Secondary | ICD-10-CM

## 2018-02-02 DIAGNOSIS — Z9989 Dependence on other enabling machines and devices: Secondary | ICD-10-CM

## 2018-02-02 DIAGNOSIS — I25810 Atherosclerosis of coronary artery bypass graft(s) without angina pectoris: Secondary | ICD-10-CM | POA: Diagnosis not present

## 2018-02-02 DIAGNOSIS — E785 Hyperlipidemia, unspecified: Secondary | ICD-10-CM | POA: Diagnosis not present

## 2018-02-02 DIAGNOSIS — G4733 Obstructive sleep apnea (adult) (pediatric): Secondary | ICD-10-CM

## 2018-02-02 NOTE — Patient Instructions (Signed)
Medication Instructions:  Continue same meds If you need a refill on your cardiac medications before your next appointment, please call your pharmacy.   Lab work: None ordered   Testing/Procedures: None ordered  Follow-Up: [facility], you and your health needs are our priority.  As part of our continuing mission to provide you with exceptional heart care, we have created designated Provider Care Teams.  These Care Teams include your primary Cardiologist (physician) and Advanced Practice Providers (APPs -  Physician Assistants and Nurse Practitioners) who all work together to provide you with the care you need, when you need it. . Follow Up with Dr.Kelly in 6 months   Call 2 months before to schedule

## 2018-02-02 NOTE — Progress Notes (Signed)
Cardiology Office Note    Date:  02/04/2018   ID:  Jasmine Reeves 1940-04-25, MRN [id number]  PCP:  [name], MD  Cardiologist: Dr. Tresa Endo  Chief Complaint  Patient presents with  . Follow-up    seen for Dr. Tresa Endo.     History of Present Illness:  Jasmine Reeves is a 77 y.o. female with PMH of CAD s/p CABG x 4 on 03/02/2000, hypertension, obstructive sleep apnea, history of mitral valve prolapse, Raynaud's disease, and hyperlipidemia.  Patient underwent CABG by Dr. Cornelius Moras in 2002 with LIMA to distal LAD, SVG to D2, sequential SVG to 2 OMs.  His RCA was free of disease and it was not bypassed.  Myoview in July 2015 showed ST changes but nuclear image showed apical thinning with breast attenuation without ischemia.  She was diagnosed with obstructive sleep apnea through a home sleep study in [place] and was started on CPAP therapy.  Patient presents today for cardiology office visit.  She has been doing well from cardiology perspective.  She denies any obvious chest discomfort or shortness of breath.  She has no lower extremity edema, orthopnea or PND.  She has been having a recent cold and has not used CPAP machine temporarily, however plan to resume after she recover from the cold.   Past Medical History:  Diagnosis Date  . COPD (chronic obstructive pulmonary disease) (HCC)   . Fibromyalgia   . GERD (gastroesophageal reflux disease)   . IBS (irritable bowel syndrome)   . Scoliosis     Past Surgical History:  Procedure Laterality Date  . ABDOMINAL HYSTERECTOMY    . APPENDECTOMY    . CORONARY ANGIOPLASTY  12/11  . CORONARY ARTERY BYPASS GRAFT    . ESOPHAGOGASTRODUODENOSCOPY  07/21/12   polyps, Hiatal hernia Dr. Matthias Hughs  . TONSILLECTOMY    . TUBAL LIGATION      Current Medications: Outpatient Medications Prior to Visit  Medication Sig Dispense Refill  . acyclovir (ZOVIRAX) 400 MG tablet Take 1 pill daily with food 3 times a day 30 tablet 0  . aspirin 81 MG  tablet Take 81 mg by mouth daily.    Marland Kitchen CALCIUM CITRATE PO Take 500 mg by mouth daily.    . Cholecalciferol (VITAMIN D) 2000 UNITS tablet Take 2,000 Units by mouth 2 (two) times daily.    Marland Kitchen DILT-XR 180 MG 24 hr capsule TAKE 1 CAPSULE BY MOUTH DAILY 90 capsule 3  . fexofenadine (ALLEGRA) 180 MG tablet Take 180 mg by mouth daily.    . fluticasone (FLONASE) 50 MCG/ACT nasal spray SHAKE LIQUID AND USE 2 SPRAYS IN EACH NOSTRIL TWICE DAILY 48 g 1  . fluticasone (VERAMYST) 27.5 MCG/SPRAY nasal spray Place 2 sprays into the nose daily as needed for rhinitis. 10 g 8  . IRON PO Take 50 mg by mouth daily.    Marland Kitchen labetalol (NORMODYNE) 100 MG tablet TAKE 1 AND 1/2 TABLETS BY MOUTH TWICE DAILY 270 tablet 3  . Magnesium 400 MG CAPS Take by mouth daily.    . Multiple Vitamins-Minerals (MULTIVITAMIN WITH MINERALS) tablet Take 1 tablet by mouth daily.    . nitroGLYCERIN (NITROSTAT) 0.4 MG SL tablet Place 1 tablet (0.4 mg total) under the tongue every 5 (five) minutes as needed for chest pain. 25 tablet 12  . pantoprazole (PROTONIX) 40 MG tablet TAKE 1 TABLET BY MOUTH TWICE DAILY 30 MINUTES BEFORE BREAKFAST AND DINNER 180 tablet 1  . promethazine-dextromethorphan (PROMETHAZINE-DM) 6.25-15 MG/5ML syrup Take 5  mLs by mouth 4 (four) times daily as needed for cough. 240 mL 1  . ranitidine (ZANTAC) 150 MG tablet TAKE 1 TABLET(150 MG) BY MOUTH TWICE DAILY 180 tablet 1  . triamcinolone ointment (KENALOG) 0.1 % Apply 1 application topically 2 (two) times daily. 80 g 1  . trimethoprim-polymyxin b (POLYTRIM) ophthalmic solution Place 1 drop into both eyes every 4 (four) hours. For 10 days. Dispense quantity sufficient. 30 mL 0  . vitamin C (ASCORBIC ACID) 500 MG tablet Take 500 mg by mouth daily.    Marland Kitchen. VYTORIN 10-20 MG tablet TAKE 1 TABLET BY MOUTH EVERY DAY 90 tablet 3  . meloxicam (MOBIC) 15 MG tablet TAKE 1/2 TO 1 TABLET BY MOUTH DAILY ON A FULL STOMACH FOR PAIN AND INFLAMMATION 90 tablet 1  . Probiotic Product (PROBIOTIC-10  PO) Take 1 capsule by mouth.     No facility-administered medications prior to visit.      Allergies:   Altace [ramipril]; Azithromycin; Celebrex [celecoxib]; Hydrochlorothiazide; Ppd [tuberculin purified protein derivative]; and Prilosec [omeprazole]   Social History   Socioeconomic History  . Marital status: Married    Spouse name: Not on file  . Number of children: Not on file  . Years of education: Not on file  . Highest education level: Not on file  Occupational History  . Not on file  Social Needs  . Financial resource strain: Not on file  . Food insecurity:    Worry: Not on file    Inability: Not on file  . Transportation needs:    Medical: Not on file    Non-medical: Not on file  Tobacco Use  . Smoking status: Former Smoker    Last attempt to quit: 09/03/1965    Years since quitting: 52.4  . Smokeless tobacco: Never Used  Substance and Sexual Activity  . Alcohol use: Yes    Alcohol/week: 3.0 standard drinks    Types: 3 Standard drinks or equivalent per week    Comment: rare  . Drug use: No  . Sexual activity: Not on file  Lifestyle  . Physical activity:    Days per week: Not on file    Minutes per session: Not on file  . Stress: Not on file  Relationships  . Social connections:    Talks on phone: Not on file    Gets together: Not on file    Attends religious service: Not on file    Active member of club or organization: Not on file    Attends meetings of clubs or organizations: Not on file    Relationship status: Not on file  Other Topics Concern  . Not on file  Social History Narrative  . Not on file     Family History:  The patient's family history includes Heart disease in her brother; Hyperlipidemia in her brother.   ROS:   Please see the history of present illness.    ROS All other systems reviewed and are negative.   PHYSICAL EXAM:   VS:  BP 112/72   Pulse 69   Ht 5\' 5"  (1.651 m)   Wt 134 lb 9.6 oz (61.1 kg)   BMI 22.40 kg/m    GEN:  Well nourished, well developed, in no acute distress  HEENT: normal  Neck: no JVD, carotid bruits, or masses Cardiac: RRR; no murmurs, rubs, or gallops,no edema  Respiratory:  clear to auscultation bilaterally, normal work of breathing GI: soft, nontender, nondistended, + BS MS: no deformity or atrophy  Skin: warm and dry, no rash Neuro:  Alert and Oriented x 3, Strength and sensation are intact Psych: euthymic mood, full affect  Wt Readings from Last 3 Encounters:  02/02/18 134 lb 9.6 oz (61.1 kg)  02/01/18 134 lb 9.6 oz (61.1 kg)  01/17/18 132 lb 12.8 oz (60.2 kg)      Studies/Labs Reviewed:   EKG:  EKG is ordered today.  The ekg ordered today demonstrates NSR with PACs  Recent Labs: 11/03/2017: ALT 20; BUN 15; Creat 0.78; Hemoglobin 13.2; Magnesium 2.3; Platelets 189; Potassium 4.1; Sodium 141; TSH 3.10   Lipid Panel    Component Value Date/Time   CHOL 150 11/03/2017 1052   TRIG 92 11/03/2017 1052   HDL 61 11/03/2017 1052   CHOLHDL 2.5 11/03/2017 1052   VLDL 22 10/04/2016 1114   LDLCALC 71 11/03/2017 1052    Additional studies/ records that were reviewed today include:   Myoview 09/07/2013 Impression Exercise Capacity:  Good exercise capacity. BP Response:  Normal blood pressure response. Clinical Symptoms:  No significant symptoms noted. ECG Impression:  Significant ST abnormalities consistent with ischemia. Comparison with Prior Nuclear Study: No significant change from previous study  Overall Impression:  Low risk stress nuclear study Apical thinning and breast attenuation artifact.  LV Wall Motion:  Normal Wall Motion   ASSESSMENT:    1. Coronary artery disease involving coronary bypass graft of native heart without angina pectoris   2. Essential hypertension   3. OSA on CPAP   4. Hyperlipidemia LDL goal <70      PLAN:  In order of problems listed above:  1. CAD s/p CABG: Denies any recent chest pain or shortness of breath.  Continue on aspirin and  statin  2. Hypertension: Blood pressure well controlled on current therapy  3. Obstructive sleep apnea: Continue using CPAP  4. Hyperlipidemia: On Vytorin, lipid panel well-controlled    Medication Adjustments/Labs and Tests Ordered: Current medicines are reviewed at length with the patient today.  Concerns regarding medicines are outlined above.  Medication changes, Labs and Tests ordered today are listed in the Patient Instructions below. Patient Instructions  Medication Instructions:  Continue same meds If you need a refill on your cardiac medications before your next appointment, please call your pharmacy.   Lab work: None ordered   Testing/Procedures: None ordered  Follow-Up: At [facility], you and your health needs are our priority.  As part of our continuing mission to provide you with exceptional heart care, we have created designated Provider Care Teams.  These Care Teams include your primary Cardiologist (physician) and Advanced Practice Providers (APPs -  Physician Assistants and Nurse Practitioners) who all work together to provide you with the care you need, when you need it. . Follow Up with Dr.Kelly in 6 months   Call 2 months before to schedule      Signed, Azalee Course, PA  02/04/2018 11:53 PM    [facility] [address] [place], [place], [place]  [address] Phone: [phone]; Fax: [phone]

## 2018-02-04 ENCOUNTER — Other Ambulatory Visit: Payer: Self-pay | Admitting: Internal Medicine

## 2018-02-04 ENCOUNTER — Encounter: Payer: Self-pay | Admitting: Physician Assistant

## 2018-02-04 DIAGNOSIS — M779 Enthesopathy, unspecified: Principal | ICD-10-CM

## 2018-02-04 DIAGNOSIS — M778 Other enthesopathies, not elsewhere classified: Secondary | ICD-10-CM

## 2018-02-07 ENCOUNTER — Ambulatory Visit: Payer: Self-pay | Admitting: Internal Medicine

## 2018-02-07 ENCOUNTER — Encounter: Payer: Self-pay | Admitting: Internal Medicine

## 2018-02-07 ENCOUNTER — Ambulatory Visit: Payer: Medicare Other | Admitting: Internal Medicine

## 2018-02-07 ENCOUNTER — Other Ambulatory Visit: Payer: Self-pay | Admitting: *Deleted

## 2018-02-07 VITALS — BP 126/72 | HR 64 | Temp 97.9°F | Resp 16 | Ht 63.5 in | Wt 133.6 lb

## 2018-02-07 DIAGNOSIS — E559 Vitamin D deficiency, unspecified: Secondary | ICD-10-CM | POA: Diagnosis not present

## 2018-02-07 DIAGNOSIS — E782 Mixed hyperlipidemia: Secondary | ICD-10-CM

## 2018-02-07 DIAGNOSIS — I25118 Atherosclerotic heart disease of native coronary artery with other forms of angina pectoris: Secondary | ICD-10-CM

## 2018-02-07 DIAGNOSIS — Z9989 Dependence on other enabling machines and devices: Secondary | ICD-10-CM

## 2018-02-07 DIAGNOSIS — K219 Gastro-esophageal reflux disease without esophagitis: Secondary | ICD-10-CM

## 2018-02-07 DIAGNOSIS — R7303 Prediabetes: Secondary | ICD-10-CM

## 2018-02-07 DIAGNOSIS — R7309 Other abnormal glucose: Secondary | ICD-10-CM | POA: Diagnosis not present

## 2018-02-07 DIAGNOSIS — I10 Essential (primary) hypertension: Secondary | ICD-10-CM

## 2018-02-07 DIAGNOSIS — G4733 Obstructive sleep apnea (adult) (pediatric): Secondary | ICD-10-CM

## 2018-02-07 DIAGNOSIS — R079 Chest pain, unspecified: Secondary | ICD-10-CM

## 2018-02-07 DIAGNOSIS — Z79899 Other long term (current) drug therapy: Secondary | ICD-10-CM

## 2018-02-07 MED ORDER — NITROGLYCERIN 0.4 MG SL SUBL: 0.4000 mg | SUBLINGUAL_TABLET | SUBLINGUAL | 12 refills | Status: DC | PRN

## 2018-02-07 NOTE — Progress Notes (Signed)
This very nice 77 y.o. MWF  presents for 6 month follow up with HTN, ASCAD / CABG,  HLD, Pre-Diabetes and Vitamin D Deficiency. Patient is on CPAP for OSA with improved restorative sleep.      Patient is treated for HTN (1988)& BP has been controlled at home. Today's BP is at goal - 126/72.  In 2001, she had PCA/Stents x 2 in Nov & Dec and in Jan 2002, she had CABG and has done well since. In 2015, stress Myoview was Negative. Patient has had no complaints of any cardiac type chest pain, palpitations, dyspnea / orthopnea / PND, dizziness, claudication, or dependent edema.     Hyperlipidemia is controlled with diet & meds. Patient denies myalgias or other med SE's. Last Lipids were at goal: Lab Results  Component Value Date   CHOL 150 11/03/2017   HDL 61 11/03/2017   LDLCALC 71 11/03/2017   TRIG 92 11/03/2017   CHOLHDL 2.5 11/03/2017      Also, the patient has history of  PreDiabetes (A1c 6.3% / 2012 &5.8% / Feb.2019)  and has had no symptoms of reactive hypoglycemia, diabetic polys, paresthesias or visual blurring.  Last A1c was Normal & at goal: Lab Results  Component Value Date   HGBA1C 5.6 07/14/2017      Further, the patient also has history of Vitamin D Deficiency and supplements vitamin D without any suspected side-effects. Last vitamin D was at goal:  Lab Results  Component Value Date   VD25OH 98 07/14/2017   Current Outpatient Medications on File Prior to Visit  Medication Sig  . acyclovir (ZOVIRAX) 400 MG tablet Take 1 pill daily with food 3 times a day  . aspirin 81 MG tablet Take 81 mg by mouth daily.  Marland Kitchen CALCIUM CITRATE PO Take 500 mg by mouth daily.  . Cholecalciferol (VITAMIN D) 2000 UNITS tablet Take 2,000 Units by mouth 2 (two) times daily.  Marland Kitchen DILT-XR 180 MG 24 hr capsule TAKE 1 CAPSULE BY MOUTH DAILY  . fexofenadine (ALLEGRA) 180 MG tablet Take 180 mg by mouth daily.  . fluticasone (VERAMYST) 27.5 MCG/SPRAY nasal spray Place 2 sprays into the nose daily as needed  for rhinitis.  . IRON PO Take 50 mg by mouth daily.  Marland Kitchen labetalol (NORMODYNE) 100 MG tablet TAKE 1 AND 1/2 TABLETS BY MOUTH TWICE DAILY  . Magnesium 400 MG CAPS Take by mouth daily.  . meloxicam (MOBIC) 15 MG tablet TAKE 1/2 TO 1 TABLET BY MOUTH DAILY ON A FULL STOMACH FOR PAIN AND INFLAMMATION  . Multiple Vitamins-Minerals (MULTIVITAMIN WITH MINERALS) tablet Take 1 tablet by mouth daily.  . pantoprazole (PROTONIX) 40 MG tablet TAKE 1 TABLET BY MOUTH TWICE DAILY 30 MINUTES BEFORE BREAKFAST AND DINNER  . promethazine-dextromethorphan (PROMETHAZINE-DM) 6.25-15 MG/5ML syrup Take 5 mLs by mouth 4 (four) times daily as needed for cough.  . ranitidine (ZANTAC) 150 MG tablet TAKE 1 TABLET(150 MG) BY MOUTH TWICE DAILY  . triamcinolone ointment (KENALOG) 0.1 % Apply 1 application topically 2 (two) times daily.  . vitamin C (ASCORBIC ACID) 500 MG tablet Take 500 mg by mouth daily.  Marland Kitchen VYTORIN 10-20 MG tablet TAKE 1 TABLET BY MOUTH EVERY DAY   No current facility-administered medications on file prior to visit.    Allergies  Allergen Reactions  . Altace [Ramipril]     cough  . Azithromycin     palpitation  . Celebrex [Celecoxib]     Gi upset   .  Hydrochlorothiazide     cramping  . 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 Prilosec [Omeprazole]     constipation   PMHx:   Past Medical History:  Diagnosis Date  . COPD (chronic obstructive pulmonary disease) (HCC)   . Fibromyalgia   . GERD (gastroesophageal reflux disease)   . IBS (irritable bowel syndrome)   . Scoliosis    Immunization History  Administered Date(s) Administered  . Influenza, High Dose Seasonal PF 11/11/2014, 12/08/2015  . Influenza,inj,Quad PF,6+ Mos 11/27/2016, 11/20/2017  . Pneumococcal Conjugate-13 04/16/2014  . Pneumococcal-Unspecified 03/01/1998, 03/12/2010  . Td 03/27/2012  . Zoster 03/02/2007   Past Surgical History:  Procedure Laterality Date  . ABDOMINAL HYSTERECTOMY    .  APPENDECTOMY    . CORONARY ANGIOPLASTY  12/11  . CORONARY ARTERY BYPASS GRAFT    . ESOPHAGOGASTRODUODENOSCOPY  07/21/12   polyps, Hiatal hernia Dr. Matthias Hughs  . TONSILLECTOMY    . TUBAL LIGATION     FHx:    Reviewed / unchanged  SHx:    Reviewed / unchanged   Systems Review:  Constitutional: Denies fever, chills, wt changes, headaches, insomnia, fatigue, night sweats, change in appetite. Eyes: Denies redness, blurred vision, diplopia, discharge, itchy, watery eyes.  ENT: Denies discharge, congestion, post nasal drip, epistaxis, sore throat, earache, hearing loss, dental pain, tinnitus, vertigo, sinus pain, snoring.  CV: Denies chest pain, palpitations, irregular heartbeat, syncope, dyspnea, diaphoresis, orthopnea, PND, claudication or edema. Respiratory: denies cough, dyspnea, DOE, pleurisy, hoarseness, laryngitis, wheezing.  Gastrointestinal: Denies dysphagia, odynophagia, heartburn, reflux, water brash, abdominal pain or cramps, nausea, vomiting, bloating, diarrhea, constipation, hematemesis, melena, hematochezia  or hemorrhoids. Genitourinary: Denies dysuria, frequency, urgency, nocturia, hesitancy, discharge, hematuria or flank pain. Musculoskeletal: Denies arthralgias, myalgias, stiffness, jt. swelling, pain, limping or strain/sprain.  Skin: Denies pruritus, rash, hives, warts, acne, eczema or change in skin lesion(s). Neuro: No weakness, tremor, incoordination, spasms, paresthesia or pain. Psychiatric: Denies confusion, memory loss or sensory loss. Endo: Denies change in weight, skin or hair change.  Heme/Lymph: No excessive bleeding, bruising or enlarged lymph nodes.  Physical Exam  BP 126/72   Pulse 64   Temp 97.9 F (36.6 C)   Resp 16   Ht 5' 3.5" (1.613 m)   Wt 133 lb 9.6 oz (60.6 kg)   BMI 23.29 kg/m   Appears  well nourished, well groomed  and in no distress.  Eyes: PERRLA, EOMs, conjunctiva no swelling or erythema. Sinuses: No frontal/maxillary  tenderness ENT/Mouth: EAC's clear, TM's nl w/o erythema, bulging. Nares clear w/o erythema, swelling, exudates. Oropharynx clear without erythema or exudates. Oral hygiene is good. Tongue normal, non obstructing. Hearing intact.  Neck: Supple. Thyroid not palpable. Car 2+/2+ without bruits, nodes or JVD. Chest: Respirations nl with BS clear & equal w/o rales, rhonchi, wheezing or stridor.  Cor: Heart sounds normal w/ regular rate and rhythm without sig. murmurs, gallops, clicks or rubs. Peripheral pulses normal and equal  without edema.  Abdomen: Soft & bowel sounds normal. Non-tender w/o guarding, rebound, hernias, masses or organomegaly.  Lymphatics: Unremarkable.  Musculoskeletal: Full ROM all peripheral extremities, joint stability, 5/5 strength and normal gait.  Skin: Warm, dry without exposed rashes, lesions or ecchymosis apparent.  Neuro: Cranial nerves intact, reflexes equal bilaterally. Sensory-motor testing grossly intact. Tendon reflexes grossly intact.  Pysch: Alert & oriented x 3.  Insight and judgement nl & appropriate. No ideations.  Assessment and Plan:  1. Essential hypertension  - Continue medication, monitor blood  pressure at home.  - Continue DASH diet.  Reminder to go to the ER if any CP,  SOB, nausea, dizziness, severe HA, changes vision/speech.  - CBC with Differential/Platelet - COMPLETE METABOLIC PANEL WITH GFR - Magnesium - TSH  2. Hyperlipidemia, mixed  - Continue diet/meds, exercise,& lifestyle modifications.  - Continue monitor periodic cholesterol/liver & renal functions   - Lipid panel  3. Abnormal glucose  - Continue diet, exercise,  - lifestyle modifications.  - Monitor appropriate labs.  - Hemoglobin A1c - Insulin, random  4. Vitamin D deficiency  - Continue supplementation.  - VITAMIN D 25 Hydroxyl  5. Prediabetes  - Hemoglobin A1c - Insulin, random  6. Coronary artery disease involving native coronary artery of native heart with  other form of angina pectoris (HCC)  - Lipid panel  7. Gastroesophageal reflux disease  - CBC with Differential/Platelet  8. OSA on CPAP   9. Medication management  - CBC with Differential/Platelet - COMPLETE METABOLIC PANEL WITH GFR - Magnesium - Lipid panel - TSH - Hemoglobin A1c - Insulin, random - VITAMIN D 25 Hydroxyl       Discussed  regular exercise, BP monitoring, weight control to achieve/maintain BMI less than 25 and discussed med and SE's. Recommended labs to assess and monitor clinical status with further disposition pending results of labs. Over 30 minutes of exam, counseling, chart review was performed.

## 2018-02-07 NOTE — Patient Instructions (Signed)

## 2018-02-08 LAB — VITAMIN D 25 HYDROXY (VIT D DEFICIENCY, FRACTURES): Vit D, 25-Hydroxy: 66 ng/mL (ref 30–100)

## 2018-02-08 LAB — COMPLETE METABOLIC PANEL WITH GFR
AG Ratio: 2 (calc) (ref 1.0–2.5)
ALT: 22 U/L (ref 6–29)
AST: 21 U/L (ref 10–35)
Albumin: 4.2 g/dL (ref 3.6–5.1)
Alkaline phosphatase (APISO): 72 U/L (ref 33–130)
BUN: 21 mg/dL (ref 7–25)
CO2: 25 mmol/L (ref 20–32)
Calcium: 9.3 mg/dL (ref 8.6–10.4)
Chloride: 107 mmol/L (ref 98–110)
Creat: 0.74 mg/dL (ref 0.60–0.93)
GFR, Est African American: 92 mL/min/{1.73_m2} (ref >=60)
GFR, Est Non African American: 79 mL/min/{1.73_m2} (ref >=60)
Globulin: 2.1 g/dL (calc) (ref 1.9–3.7)
Glucose, Bld: 99 mg/dL (ref 65–99)
Potassium: 3.9 mmol/L (ref 3.5–5.3)
Sodium: 142 mmol/L (ref 135–146)
Total Bilirubin: 0.5 mg/dL (ref 0.2–1.2)
Total Protein: 6.3 g/dL (ref 6.1–8.1)

## 2018-02-08 LAB — CBC WITH DIFFERENTIAL/PLATELET
Basophils Absolute: 60 cells/uL (ref 0–200)
Basophils Relative: 1.3 %
Eosinophils Absolute: 327 cells/uL (ref 15–500)
Eosinophils Relative: 7.1 %
HCT: 36.8 % (ref 35.0–45.0)
Hemoglobin: 12.1 g/dL (ref 11.7–15.5)
Lymphs Abs: 1164 cells/uL (ref 850–3900)
MCH: 30.7 pg (ref 27.0–33.0)
MCHC: 32.9 g/dL (ref 32.0–36.0)
MCV: 93.4 fL (ref 80.0–100.0)
MPV: 9.9 fL (ref 7.5–12.5)
Monocytes Relative: 10.8 %
Neutro Abs: 2553 cells/uL (ref 1500–7800)
Neutrophils Relative %: 55.5 %
Platelets: 235 10*3/uL (ref 140–400)
RBC: 3.94 10*6/uL (ref 3.80–5.10)
RDW: 12.2 % (ref 11.0–15.0)
Total Lymphocyte: 25.3 %
WBC mixed population: 497 cells/uL (ref 200–950)
WBC: 4.6 10*3/uL (ref 3.8–10.8)

## 2018-02-08 LAB — LIPID PANEL
Cholesterol: 161 mg/dL (ref <200)
HDL: 69 mg/dL (ref >50)
LDL Cholesterol (Calc): 78 mg/dL (calc)
Non-HDL Cholesterol (Calc): 92 mg/dL (calc) (ref <130)
Total CHOL/HDL Ratio: 2.3 (calc) (ref <5.0)
Triglycerides: 68 mg/dL (ref <150)

## 2018-02-08 LAB — HEMOGLOBIN A1C
Hgb A1c MFr Bld: 5.6 % of total Hgb (ref <5.7)
Mean Plasma Glucose: 114 (calc)
eAG (mmol/L): 6.3 (calc)

## 2018-02-08 LAB — TSH: TSH: 3.35 mIU/L (ref 0.40–4.50)

## 2018-02-08 LAB — INSULIN, RANDOM: Insulin: 8.6 u[IU]/mL (ref 2.0–19.6)

## 2018-02-08 LAB — MAGNESIUM: Magnesium: 2.2 mg/dL (ref 1.5–2.5)

## 2018-02-10 ENCOUNTER — Emergency Department (HOSPITAL_COMMUNITY): Payer: Medicare Other

## 2018-02-10 ENCOUNTER — Encounter (HOSPITAL_COMMUNITY): Payer: Self-pay

## 2018-02-10 ENCOUNTER — Emergency Department (HOSPITAL_COMMUNITY)
Admission: EM | Admit: 2018-02-10 | Discharge: 2018-02-10 | Disposition: A | Discharge status: Home / Self Care | Payer: Medicare Other | Attending: Emergency Medicine | Admitting: Emergency Medicine

## 2018-02-10 ENCOUNTER — Other Ambulatory Visit: Payer: Self-pay

## 2018-02-10 DIAGNOSIS — Z87891 Personal history of nicotine dependence: Secondary | ICD-10-CM | POA: Insufficient documentation

## 2018-02-10 DIAGNOSIS — Z23 Encounter for immunization: Secondary | ICD-10-CM | POA: Diagnosis not present

## 2018-02-10 DIAGNOSIS — Z951 Presence of aortocoronary bypass graft: Secondary | ICD-10-CM | POA: Diagnosis not present

## 2018-02-10 DIAGNOSIS — Y9301 Activity, walking, marching and hiking: Secondary | ICD-10-CM | POA: Diagnosis not present

## 2018-02-10 DIAGNOSIS — I2510 Atherosclerotic heart disease of native coronary artery without angina pectoris: Secondary | ICD-10-CM | POA: Diagnosis not present

## 2018-02-10 DIAGNOSIS — S0081XA Abrasion of other part of head, initial encounter: Secondary | ICD-10-CM | POA: Insufficient documentation

## 2018-02-10 DIAGNOSIS — Z79899 Other long term (current) drug therapy: Secondary | ICD-10-CM | POA: Diagnosis not present

## 2018-02-10 DIAGNOSIS — J449 Chronic obstructive pulmonary disease, unspecified: Secondary | ICD-10-CM | POA: Insufficient documentation

## 2018-02-10 DIAGNOSIS — W101XXA Fall (on)(from) sidewalk curb, initial encounter: Secondary | ICD-10-CM | POA: Diagnosis not present

## 2018-02-10 DIAGNOSIS — S01511A Laceration without foreign body of lip, initial encounter: Secondary | ICD-10-CM | POA: Diagnosis not present

## 2018-02-10 DIAGNOSIS — W01118A Fall on same level from slipping, tripping and stumbling with subsequent striking against other sharp object, initial encounter: Secondary | ICD-10-CM | POA: Diagnosis not present

## 2018-02-10 DIAGNOSIS — S0990XA Unspecified injury of head, initial encounter: Secondary | ICD-10-CM | POA: Diagnosis present

## 2018-02-10 DIAGNOSIS — Y999 Unspecified external cause status: Secondary | ICD-10-CM | POA: Diagnosis not present

## 2018-02-10 DIAGNOSIS — Y929 Unspecified place or not applicable: Secondary | ICD-10-CM | POA: Insufficient documentation

## 2018-02-10 DIAGNOSIS — W19XXXA Unspecified fall, initial encounter: Secondary | ICD-10-CM

## 2018-02-10 DIAGNOSIS — Z7982 Long term (current) use of aspirin: Secondary | ICD-10-CM | POA: Diagnosis not present

## 2018-02-10 DIAGNOSIS — S0993XA Unspecified injury of face, initial encounter: Secondary | ICD-10-CM

## 2018-02-10 MED ORDER — TETANUS-DIPHTH-ACELL PERTUSSIS 5-2.5-18.5 LF-MCG/0.5 IM SUSP
0.5000 mL | Freq: Once | INTRAMUSCULAR | Status: AC
  Administered 2018-02-10: 0.5 mL via INTRAMUSCULAR
  Filled 2018-02-10: qty 0.5

## 2018-02-10 MED ORDER — BACITRACIN ZINC 500 UNIT/GM EX OINT
TOPICAL_OINTMENT | Freq: Once | CUTANEOUS | Status: AC
  Administered 2018-02-10: 1 via TOPICAL
  Filled 2018-02-10: qty 0.9

## 2018-02-10 NOTE — Discharge Instructions (Addendum)
Thank you for allowing [facility]. You came in due to mechanical fall and facial injury. Your head CT scan was reassuring. We applied Dermabond on the nose and lip injury. And gave you Tetanus shot. Please follow the instruction provided for wound care. Return to ED If developing bleeding, headache, weakness, nausea or vomiting.  Thank you

## 2018-02-10 NOTE — ED Provider Notes (Signed)
[facility]-EMERGENCY DEPT Provider Note   CSN: [id number] Arrival date & time: 02/10/18  1247     History   Chief Complaint Chief Complaint  Patient presents with  . Fall  . Head Laceration    HPI Jasmine Reeves is a 77 y.o. female. presented due to fall. She mentions that this morning and when was walkin in the rain, her foot slipped and she fell. She then noticed some blood on her forehead and lip. She denies LOC, N/A. She has mild dull headache but no other pain. She drove to ED by her own. Does not have any dizziness, weakness, chest pain or shortness of breath. No other injury reported. Last Tetanus shot was 2014.  HPI  Past Medical History:  Diagnosis Date  . COPD (chronic obstructive pulmonary disease) (HCC)   . Fibromyalgia   . GERD (gastroesophageal reflux disease)   . IBS (irritable bowel syndrome)   . Scoliosis     Patient Active Problem List   Diagnosis Date Noted  . BMI 24.0-24.9, adult 11/01/2017  . Osteopenia 04/12/2017  . OSA on CPAP 11/27/2014  . IBS (irritable bowel syndrome) 11/11/2014  . Encounter for Medicare annual wellness exam 11/11/2014  . Medication management 04/12/2014  . Abnormal blood sugar 10/10/2013  . Mixed hyperlipidemia 10/08/2013  . CAD (coronary artery disease) 02/03/2013  . MVP (mitral valve prolapse) 02/03/2013  . Vitamin D deficiency   . GERD (gastroesophageal reflux disease)   . COPD (chronic obstructive pulmonary disease) (HCC) 02/11/2010  . Essential hypertension 01/28/2010  . RAYNAUDS SYNDROME 01/28/2010    Past Surgical History:  Procedure Laterality Date  . ABDOMINAL HYSTERECTOMY    . APPENDECTOMY    . CORONARY ANGIOPLASTY  12/11  . CORONARY ARTERY BYPASS GRAFT    . ESOPHAGOGASTRODUODENOSCOPY  07/21/12   polyps, Hiatal hernia Dr. Matthias Hughs  . TONSILLECTOMY    . TUBAL LIGATION       OB History   No obstetric history on file.      Home Medications    Prior to Admission medications     Medication Sig Start Date End Date Taking? Authorizing Provider  acyclovir (ZOVIRAX) 400 MG tablet Take 1 pill daily with food 3 times a day 02/01/18 02/11/18  Judd Gaudier, NP  aspirin 81 MG tablet Take 81 mg by mouth daily.    [provider]  CALCIUM CITRATE PO Take 500 mg by mouth daily.    [provider]  Cholecalciferol (VITAMIN D) 2000 UNITS tablet Take 2,000 Units by mouth 2 (two) times daily.    [provider]  DILT-XR 180 MG 24 hr capsule TAKE 1 CAPSULE BY MOUTH DAILY 07/30/17   [name], MD  fexofenadine (ALLEGRA) 180 MG tablet Take 180 mg by mouth daily.    [provider]  fluticasone (VERAMYST) 27.5 MCG/SPRAY nasal spray Place 2 sprays into the nose daily as needed for rhinitis. 09/04/15   Shirleen Schirmer, PA-C  IRON PO Take 50 mg by mouth daily.    [provider]  labetalol (NORMODYNE) 100 MG tablet TAKE 1 AND 1/2 TABLETS BY MOUTH TWICE DAILY 01/13/18   [name], MD  Magnesium 400 MG CAPS Take by mouth daily.    [provider]  meloxicam (MOBIC) 15 MG tablet TAKE 1/2 TO 1 TABLET BY MOUTH DAILY ON A FULL STOMACH FOR PAIN AND INFLAMMATION 02/04/18   [name], MD  Multiple Vitamins-Minerals (MULTIVITAMIN WITH MINERALS) tablet Take 1 tablet by mouth daily.  [provider]  nitroGLYCERIN (NITROSTAT) 0.4 MG SL tablet Place 1 tablet (0.4 mg total) under the tongue every 5 (five) minutes as needed for chest pain. 02/07/18   [name], MD  pantoprazole (PROTONIX) 40 MG tablet TAKE 1 TABLET BY MOUTH TWICE DAILY 30 MINUTES BEFORE BREAKFAST AND DINNER 12/20/17   [name], MD  promethazine-dextromethorphan (PROMETHAZINE-DM) 6.25-15 MG/5ML syrup Take 5 mLs by mouth 4 (four) times daily as needed for cough. 12/26/17   Judd Gaudier, NP  ranitidine (ZANTAC) 150 MG tablet TAKE 1 TABLET(150 MG) BY MOUTH TWICE DAILY 11/15/17   [name], MD  triamcinolone ointment (KENALOG) 0.1 % Apply 1  application topically 2 (two) times daily. 02/01/18   Judd Gaudier, NP  vitamin C (ASCORBIC ACID) 500 MG tablet Take 500 mg by mouth daily.    [provider]  VYTORIN 10-20 MG tablet TAKE 1 TABLET BY MOUTH EVERY DAY 07/30/17   [name], MD    Family History Family History  Problem Relation Age of Onset  . Heart disease Brother   . Hyperlipidemia Brother     Social History Social History   Tobacco Use  . Smoking status: Former Smoker    Last attempt to quit: 09/03/1965    Years since quitting: 52.4  . Smokeless tobacco: Never Used  Substance Use Topics  . Alcohol use: Yes    Alcohol/week: 3.0 standard drinks    Types: 3 Standard drinks or equivalent per week    Comment: rare  . Drug use: No     Allergies   Altace [ramipril]; Azithromycin; Celebrex [celecoxib]; Hydrochlorothiazide; Ppd [tuberculin purified protein derivative]; and Prilosec [omeprazole]   Review of Systems Review of Systems  Constitutional: Negative for chills and fever.  Respiratory: Negative for shortness of breath.   Cardiovascular: Negative for chest pain.  Gastrointestinal: Negative for abdominal pain, nausea and vomiting.  Neurological: Positive for headaches. Negative for dizziness.     Physical Exam Updated Vital Signs BP (!) 152/96 (BP Location: Right Arm)   Pulse 72   Temp 98.4 F (36.9 C) (Oral)   Resp 18   Ht 5' 3.5" (1.613 m)   Wt 60 kg   SpO2 100%   BMI 23.06 kg/m   Physical Exam HENT:     Head:     Comments: Abrasion on forehead and small laceration on lower lip    Nose:     Comments: Wound on nose    Mouth/Throat:     Pharynx: Oropharynx is clear.  Eyes:     Extraocular Movements: Extraocular movements intact.     Pupils: Pupils are equal, round, and reactive to light.  Neck:     Musculoskeletal: Normal range of motion.  Cardiovascular:     Rate and Rhythm: Normal rate and regular rhythm.     Pulses: Normal pulses.     Heart sounds: soft II/VI  systolic Murmur present.  Pulmonary:     Effort: Pulmonary effort is normal.     Breath sounds: Normal breath sounds. No wheezing or rales.  Abdominal:     Palpations: Abdomen is soft.     Tenderness: There is no abdominal tenderness.  Musculoskeletal: Normal range of motion.  Skin:    General: Skin is warm and dry.     Capillary Refill: Capillary refill takes less than 2 seconds.  Neurological:     Mental Status: She is oriented to person, place, and time.  Psychiatric:        Mood and Affect:  Mood normal.        Behavior: Behavior normal.    ED Treatments / Results  Labs (all labs ordered are listed, but only abnormal results are displayed) Labs Reviewed - No data to display  EKG None  Radiology Ct Head Wo Contrast  Result Date: 02/10/2018 CLINICAL DATA:  Head trauma with facial bleeding EXAM: CT HEAD WITHOUT CONTRAST CT MAXILLOFACIAL WITHOUT CONTRAST CT CERVICAL SPINE WITHOUT CONTRAST TECHNIQUE: Multidetector CT imaging of the head, cervical spine, and maxillofacial structures were performed using the standard protocol without intravenous contrast. Multiplanar CT image reconstructions of the cervical spine and maxillofacial structures were also generated. COMPARISON:  None. FINDINGS: CT HEAD FINDINGS Brain: No evidence of infarction, hemorrhage, hydrocephalus, extra-axial collection or mass lesion/mass effect. Vascular: No hyperdense vessel or unexpected calcification. Skull: Forehead hematoma without underlying fracture or opaque foreign body CT MAXILLOFACIAL FINDINGS Osseous: Forehead hematoma without underlying fracture. No facial fracture or mandibular dislocation. Orbits: No evidence of injury Sinuses: Negative.  No hemosinus Soft tissues: Forehead swelling as above. No opaque foreign body or soft tissue gas. CT CERVICAL SPINE FINDINGS Alignment: No traumatic malalignment. Skull base and vertebrae: Negative for fracture. Soft tissues and spinal canal: No prevertebral fluid or  swelling. No visible canal hematoma. Disc levels:  Usual disc and facet degeneration. Upper chest: Biapical pleural based scarring.  No acute finding IMPRESSION: 1. No evidence of acute intracranial or cervical spine injury. 2. Forehead hematoma without facial fracture. Electronically Signed   By: Marnee SpringJonathon  Watts M.D.   On: 02/10/2018 14:06   Ct Cervical Spine Wo Contrast  Result Date: 02/10/2018 CLINICAL DATA:  Head trauma with facial bleeding EXAM: CT HEAD WITHOUT CONTRAST CT MAXILLOFACIAL WITHOUT CONTRAST CT CERVICAL SPINE WITHOUT CONTRAST TECHNIQUE: Multidetector CT imaging of the head, cervical spine, and maxillofacial structures were performed using the standard protocol without intravenous contrast. Multiplanar CT image reconstructions of the cervical spine and maxillofacial structures were also generated. COMPARISON:  None. FINDINGS: CT HEAD FINDINGS Brain: No evidence of infarction, hemorrhage, hydrocephalus, extra-axial collection or mass lesion/mass effect. Vascular: No hyperdense vessel or unexpected calcification. Skull: Forehead hematoma without underlying fracture or opaque foreign body CT MAXILLOFACIAL FINDINGS Osseous: Forehead hematoma without underlying fracture. No facial fracture or mandibular dislocation. Orbits: No evidence of injury Sinuses: Negative.  No hemosinus Soft tissues: Forehead swelling as above. No opaque foreign body or soft tissue gas. CT CERVICAL SPINE FINDINGS Alignment: No traumatic malalignment. Skull base and vertebrae: Negative for fracture. Soft tissues and spinal canal: No prevertebral fluid or swelling. No visible canal hematoma. Disc levels:  Usual disc and facet degeneration. Upper chest: Biapical pleural based scarring.  No acute finding IMPRESSION: 1. No evidence of acute intracranial or cervical spine injury. 2. Forehead hematoma without facial fracture. Electronically Signed   By: Marnee SpringJonathon  Watts M.D.   On: 02/10/2018 14:06   Ct Maxillofacial Wo  Contrast  Result Date: 02/10/2018 CLINICAL DATA:  Head trauma with facial bleeding EXAM: CT HEAD WITHOUT CONTRAST CT MAXILLOFACIAL WITHOUT CONTRAST CT CERVICAL SPINE WITHOUT CONTRAST TECHNIQUE: Multidetector CT imaging of the head, cervical spine, and maxillofacial structures were performed using the standard protocol without intravenous contrast. Multiplanar CT image reconstructions of the cervical spine and maxillofacial structures were also generated. COMPARISON:  None. FINDINGS: CT HEAD FINDINGS Brain: No evidence of infarction, hemorrhage, hydrocephalus, extra-axial collection or mass lesion/mass effect. Vascular: No hyperdense vessel or unexpected calcification. Skull: Forehead hematoma without underlying fracture or opaque foreign body CT MAXILLOFACIAL FINDINGS Osseous: Forehead hematoma without underlying fracture.  No facial fracture or mandibular dislocation. Orbits: No evidence of injury Sinuses: Negative.  No hemosinus Soft tissues: Forehead swelling as above. No opaque foreign body or soft tissue gas. CT CERVICAL SPINE FINDINGS Alignment: No traumatic malalignment. Skull base and vertebrae: Negative for fracture. Soft tissues and spinal canal: No prevertebral fluid or swelling. No visible canal hematoma. Disc levels:  Usual disc and facet degeneration. Upper chest: Biapical pleural based scarring.  No acute finding IMPRESSION: 1. No evidence of acute intracranial or cervical spine injury. 2. Forehead hematoma without facial fracture. Electronically Signed   By: Marnee Spring M.D.   On: 02/10/2018 14:06    Procedures Procedures (including critical care time)  Medications Ordered in ED Medications  Tdap (BOOSTRIX) injection 0.5 mL (has no administration in time range)  bacitracin ointment (has no administration in time range)     Initial Impression / Assessment and Plan / ED Course  I have reviewed the triage vital signs and the nursing notes.  Pertinent labs & imaging results that  were available during my care of the patient were reviewed by me and considered in my medical decision making (see chart for details). Patient presented with facial injury due to mechanical fall this morning.  Abrasion on forehead, deep abrasion on nose and small laceration on upper lip. Exam is otherwise reassuring. CT head is with no acute intracranial abnormality or skull fracture. Dermabond applied on nose and lip lesion. And Bacitracin ordered to be applied on forehead abrasion.  Will discharge after Tdap. ED return precautions provided.   Final Clinical Impressions(s) / ED Diagnoses   Final diagnoses:  Fall, initial encounter  Facial injury, initial encounter    ED Discharge Orders    None       Chevis Pretty, MD 02/10/18 1444    Charlynne Pander, MD 02/10/18 [phone]

## 2018-02-10 NOTE — ED Triage Notes (Addendum)
Pt states she was at a meeting earlier today. Pt states that she lost her footing on the slick ground. Pt is bleeding from her upper lip, bridge of nose, and forehead. Denies LOC and blood thinners

## 2018-02-12 ENCOUNTER — Encounter: Payer: Self-pay | Admitting: Internal Medicine

## 2018-03-06 ENCOUNTER — Encounter: Payer: Self-pay | Admitting: Physician Assistant

## 2018-03-06 DIAGNOSIS — E1129 Type 2 diabetes mellitus with other diabetic kidney complication: Secondary | ICD-10-CM | POA: Insufficient documentation

## 2018-03-06 DIAGNOSIS — I700 Atherosclerosis of aorta: Secondary | ICD-10-CM | POA: Insufficient documentation

## 2018-03-06 DIAGNOSIS — E1165 Type 2 diabetes mellitus with hyperglycemia: Secondary | ICD-10-CM | POA: Insufficient documentation

## 2018-05-08 NOTE — Progress Notes (Signed)
MEDICARE ANNUAL WELLNESS VISIT AND FOLLOW UP  Assessment:   Diagnoses and all orders for this visit:  Encounter for Medicare annual wellness exam  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) Control blood pressure, cholesterol, glucose, increase exercise.  Prescribed nitroglycerine without recent use Followed by cardiology  Essential hypertension At goal; continue medications Monitor blood pressure at home; call if consistently over 130/80 Continue DASH diet.   Reminder to go to the ER if any CP, SOB, nausea, dizziness, severe HA, changes vision/speech, left arm numbness and tingling and jaw pain.  MVP (mitral valve prolapse) Continue BB, monitor.   Raynaud's disease without gangrene No complications, Monitor  Chronic obstructive pulmonary disease, unspecified COPD type (HCC) By imaging; denies notable symptoms of dyspnea, secretions, continue to monitor. Remote hx of smoking.   OSA on CPAP Reports typically wears 100% of the time and endorses restorative sleep  Gastroesophageal reflux disease, esophagitis presence not specified Taking PPI BID, H2i didn't help; will try carafate, but advised if no improvement in 1-2 weeks to schedule follow up appointment with GI Discussed diet, avoiding triggers and other lifestyle changes   Abnormal blood sugar Has been prediabetic; most recent A1C at goal Discussed disease and risks Discussed diet/exercise, weight management   Medication management -     CBC with Differential/Platelet -     CMP/GFR -     Magnesium   Mixed hyperlipidemia At goal; continue medication Continue low cholesterol diet and exercise.  -     Lipid panel -     TSH  Vitamin D deficiency At goal at recent check; continue to recommend supplementation for goal of 70-100 Defer vitamin D level  Osteopenia of left forearm By DEXA L forearm T -2.2 in 2018; followed by Dr. Jennette Kettle Continue  dietary calcium, vitamin D supplement, recommend weight bearing exercises   Over 40 minutes of exam, counseling, chart review and critical decision making was performed Future Appointments  Date Time Provider Department Center  08/15/2018 10:00 AM [name], MD [facility] None     Plan:   During the course of the visit the patient was educated and counseled about appropriate screening and preventive services including:    Pneumococcal vaccine   Prevnar 13  Influenza vaccine  Td vaccine  Screening electrocardiogram  Bone densitometry screening  Colorectal cancer screening  Diabetes screening  Glaucoma screening  Nutrition counseling   Advanced directives: requested   Subjective:  Jasmine Reeves is a 78 y.o. female who presents for Medicare Annual Wellness Visit and 3 month follow up. 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 , she underwent CABG. Negative myocardial scan noted in 2015. Continues to follow up with Dr. Tresa Endo. Follows annually with Dr. Jennette Kettle for mammograms and PAPs.    she has a diagnosis of GERD which is currently managed by protonix 40 mg BID she reports symptoms is not currently well controlled, and denies breakthrough reflux, but endorses burning/lump sensation in chest, no hoarseness but endorses intermittent ongoing dry cough.  She has tried taking ranitidine as well without improvement. She will follow up with Dr. Matthias Hughs.   BMI is Body mass index is 23.82 kg/m., she has been working on diet and exercise. Wt Readings from Last 3 Encounters:  05/10/18 136 lb 9.6 oz (62 kg)  02/10/18 132 lb 4.4 oz (60 kg)  02/07/18 133 lb 9.6 oz (60.6 kg)  Her blood pressure has been controlled at home, today their BP is BP: 104/68 She does not workout. She denies chest pain, shortness of breath, dizziness.   She is on cholesterol medication (vytorin 10/20) and denies myalgias. Her cholesterol is at goal. The cholesterol last visit  was:   Lab Results  Component Value Date   CHOL 161 02/07/2018   HDL 69 02/07/2018   LDLCALC 78 02/07/2018   TRIG 68 02/07/2018   CHOLHDL 2.3 02/07/2018    She has been working on diet and exercise for glucose management, and denies increased appetite, nausea, paresthesia of the feet, polydipsia, polyuria, visual disturbances, vomiting and weight loss. 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 aspirin 81 MG tablet Take 81 mg by mouth daily.    Marland Kitchen CALCIUM CITRATE PO Take 500 mg by mouth daily.    . Cholecalciferol (VITAMIN D) 2000 UNITS tablet Take 2,000 Units by mouth 2 (two) times daily.    Marland Kitchen DILT-XR 180 MG 24 hr capsule TAKE 1 CAPSULE BY MOUTH DAILY 90 capsule 3  . fexofenadine (ALLEGRA) 180 MG tablet Take 180 mg by mouth daily.    . fluticasone (VERAMYST) 27.5 MCG/SPRAY nasal spray Place 2 sprays into the nose daily as needed for rhinitis. 10 g 8  . IRON PO Take 50 mg by mouth daily.    Marland Kitchen labetalol (NORMODYNE) 100 MG tablet TAKE 1 AND 1/2 TABLETS BY MOUTH TWICE DAILY 270 tablet 3  . Magnesium 400 MG CAPS Take by mouth daily.    . meloxicam (MOBIC) 15 MG tablet TAKE 1/2 TO 1 TABLET BY MOUTH DAILY ON A FULL STOMACH FOR PAIN AND INFLAMMATION 90 tablet 1  . METHOCARBAMOL PO Take by mouth as needed.    . Multiple Vitamins-Minerals (MULTIVITAMIN WITH MINERALS) tablet Take 1 tablet by mouth daily.    . nitroGLYCERIN (NITROSTAT) 0.4 MG SL tablet Place 1 tablet (0.4 mg total) under the tongue every 5 (five) minutes as needed for chest pain. 25 tablet 12  . pantoprazole (PROTONIX) 40 MG tablet TAKE 1 TABLET  BY MOUTH TWICE DAILY 30 MINUTES BEFORE BREAKFAST AND DINNER 180 tablet 1  . triamcinolone ointment (KENALOG) 0.1 % Apply 1 application topically 2 (two) times daily. (Patient taking differently: Apply 1 application topically as needed. ) 80 g 1  . vitamin C (ASCORBIC ACID) 500 MG tablet Take 500 mg by mouth daily.    Marland Kitchen VYTORIN 10-20 MG tablet TAKE 1 TABLET BY MOUTH EVERY DAY 90 tablet 3   No current facility-administered medications on file prior to visit.     Allergies  Allergen Reactions  . Altace [Ramipril]     cough  . Azithromycin     palpitation  . Celebrex [Celecoxib]     Gi upset   . Hydrochlorothiazide     cramping  . 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 Prilosec [Omeprazole]     constipation    Current Problems (verified) Patient Active Problem List   Diagnosis Date Noted  .  Aortic atherosclerosis (HCC) 03/06/2018  . BMI 24.0-24.9, adult 11/01/2017  . Osteopenia 04/12/2017  . OSA on CPAP 11/27/2014  . IBS (irritable bowel syndrome) 11/11/2014  . Encounter for Medicare annual wellness exam 11/11/2014  . Medication management 04/12/2014  . Abnormal blood sugar 10/10/2013  . Mixed hyperlipidemia 10/08/2013  . CAD (coronary artery disease) 02/03/2013  . MVP (mitral valve prolapse) 02/03/2013  . Vitamin D deficiency   . GERD (gastroesophageal reflux disease)   . COPD (chronic obstructive pulmonary disease) (HCC) 02/11/2010  . Essential hypertension 01/28/2010  . RAYNAUDS SYNDROME 01/28/2010    Screening Tests Immunization History  Administered Date(s) Administered  . Influenza, High Dose Seasonal PF 11/11/2014, 12/08/2015  . Influenza,inj,Quad PF,6+ Mos 11/27/2016, 11/20/2017  . Pneumococcal Conjugate-13 04/16/2014  . Pneumococcal-Unspecified 03/01/1998, 03/12/2010  . Td 03/27/2012  . Tdap 02/10/2018  . Zoster 03/02/2007   Preventative care: Last colonoscopy: 12/2014 Dr. Matthias Hughs 5 year follow up Last mammogram: 07/2016 at  Dr. Donnetta Hail office, will get this year Last pap smear/pelvic exam: 2014 (Dr. Jennette Kettle)  DEXA: 2018 gets at Dr. Donnetta Hail office, normal  CXR 03/2017 Stress test 08/2013 Echo 2012 Cath 2008  Prior vaccinations: TD or Tdap: 2019  Influenza: 2019 Pneumococcal: 2012 Prevnar 13: 2016 Shingles/Zostavax: 2009  Names of Other Physician/Practitioners you currently use: 1. [facility] here for primary care 2. Dr. Dione Booze, eye doctor, last visit 2020 3. [facility], dentist, last visit 2020, goes q 6 months  Patient Care Team: [name], MD as PCP - General (Internal Medicine) Lennette Bihari, MD as Consulting Physician (Cardiology) Bernette Redbird, MD as Consulting Physician (Gastroenterology) Freddy Finner, MD as Consulting Physician (Obstetrics and Gynecology)  SURGICAL HISTORY She  has a past surgical history that includes Tonsillectomy; Appendectomy; Abdominal hysterectomy; Tubal ligation; Coronary artery bypass graft; Coronary angioplasty (12/11); and Esophagogastroduodenoscopy (07/21/12). FAMILY HISTORY Her family history includes Heart disease in her brother; Hyperlipidemia in her brother. SOCIAL HISTORY She  reports that she quit smoking about 52 years ago. She has never used smokeless tobacco. She reports current alcohol use of about 3.0 standard drinks of alcohol per week. She reports that she does not use drugs.   MEDICARE WELLNESS OBJECTIVES: Physical activity:   Cardiac risk factors:   Depression/mood screen:   Depression screen [facility] 2/9 05/10/2018  Decreased Interest 0  Down, Depressed, Hopeless 0  PHQ - 2 Score 0    ADLs:  In your present state of health, do you have any difficulty performing the following activities: 02/12/2018 07/16/2017  Hearing? N N  Vision? N N  Difficulty concentrating or making decisions? N N  Walking or climbing stairs? N N  Dressing or bathing? N N  Doing errands, shopping? N N  Some recent data might be  hidden     Cognitive Testing  Alert? Yes  Normal Appearance?Yes  Oriented to person? Yes  Place? Yes   Time? Yes  Recall of three objects?  Yes  Can perform simple calculations? Yes  Displays appropriate judgment?Yes  Can read the correct time from a watch face?Yes  EOL planning: Does Patient Have a Medical Advance Directive?: Yes Type of Advance Directive: Healthcare Power of Attorney, Living will Does patient want to make changes to medical advance directive?: No - Patient declined Copy of Healthcare Power of Attorney in Chart?: No - copy requested  Review of Systems  Constitutional: Negative for chills, fever, malaise/fatigue and weight loss.  HENT: Negative for congestion, hearing loss, sore throat and tinnitus.  Eyes: Negative for blurred vision and double vision.  Respiratory: Positive for cough (Dry, intermittent). Negative for sputum production, shortness of breath and wheezing.   Cardiovascular: Negative for chest pain, palpitations, orthopnea, claudication and leg swelling.  Gastrointestinal: Negative for abdominal pain, blood in stool, constipation, diarrhea, heartburn, melena, nausea and vomiting.       Sensation of food getting stuck in chest, bloating  Genitourinary: Negative.   Musculoskeletal: Negative for falls, joint pain and myalgias.  Skin: Negative for rash.  Neurological: Negative for dizziness, tingling, sensory change, weakness and headaches.  Endo/Heme/Allergies: Negative for environmental allergies and polydipsia.  Psychiatric/Behavioral: Negative.  Negative for depression, memory loss and substance abuse. The patient is not nervous/anxious and does not have insomnia.   All other systems reviewed and are negative.    Objective:     Today's Vitals   05/10/18 1355  BP: 104/68  Pulse: 64  Temp: 97.7 F (36.5 C)  SpO2: 98%  Weight: 136 lb 9.6 oz (62 kg)  Height: 5' 3.5" (1.613 m)   Body mass index is 23.82 kg/m.  General appearance: alert, no  distress, WD/WN, female HEENT: normocephalic, sclerae anicteric, TMs pearly, nares patent, no discharge or erythema, pharynx normal Oral cavity: MMM, no lesions Neck: supple, no lymphadenopathy, no thyromegaly, no masses Heart: RRR, normal S1, S2, no murmurs Lungs: CTA bilaterally, no wheezes, rhonchi, or rales Abdomen: +bs, soft, non tender, non distended, no masses, no hepatomegaly, no splenomegaly Musculoskeletal: nontender, no swelling, no obvious deformity Extremities: no edema, no cyanosis, no clubbing Pulses: 2+ symmetric, upper and lower extremities, normal cap refill Neurological: alert, oriented x 3, CN2-12 intact, strength normal upper extremities and lower extremities, sensation normal throughout, DTRs 2+ throughout, no cerebellar signs, gait normal Psychiatric: normal affect, behavior normal, pleasant   Medicare Attestation I have personally reviewed: The patient's medical and social history Their use of alcohol, tobacco or illicit drugs Their current medications and supplements The patient's functional ability including ADLs,fall risks, home safety risks, cognitive, and hearing and visual impairment Diet and physical activities Evidence for depression or mood disorders  The patient's weight, height, BMI, and visual acuity have been recorded in the chart.  I have made referrals, counseling, and provided education to the patient based on review of the above and I have provided the patient with a written personalized care plan for preventive services.     Dan Maker, NP   05/10/2018

## 2018-05-10 ENCOUNTER — Other Ambulatory Visit: Payer: Self-pay

## 2018-05-10 ENCOUNTER — Other Ambulatory Visit: Payer: Self-pay | Admitting: Adult Health

## 2018-05-10 ENCOUNTER — Encounter: Payer: Self-pay | Admitting: Adult Health

## 2018-05-10 ENCOUNTER — Ambulatory Visit: Payer: Medicare Other | Admitting: Adult Health

## 2018-05-10 VITALS — BP 104/68 | HR 64 | Temp 97.7°F | Ht 63.5 in | Wt 136.6 lb

## 2018-05-10 DIAGNOSIS — E782 Mixed hyperlipidemia: Secondary | ICD-10-CM

## 2018-05-10 DIAGNOSIS — G4733 Obstructive sleep apnea (adult) (pediatric): Secondary | ICD-10-CM

## 2018-05-10 DIAGNOSIS — I25118 Atherosclerotic heart disease of native coronary artery with other forms of angina pectoris: Secondary | ICD-10-CM

## 2018-05-10 DIAGNOSIS — I341 Nonrheumatic mitral (valve) prolapse: Secondary | ICD-10-CM

## 2018-05-10 DIAGNOSIS — R6889 Other general symptoms and signs: Secondary | ICD-10-CM | POA: Diagnosis not present

## 2018-05-10 DIAGNOSIS — R7309 Other abnormal glucose: Secondary | ICD-10-CM

## 2018-05-10 DIAGNOSIS — M85832 Other specified disorders of bone density and structure, left forearm: Secondary | ICD-10-CM

## 2018-05-10 DIAGNOSIS — I700 Atherosclerosis of aorta: Secondary | ICD-10-CM | POA: Diagnosis not present

## 2018-05-10 DIAGNOSIS — Z9989 Dependence on other enabling machines and devices: Secondary | ICD-10-CM

## 2018-05-10 DIAGNOSIS — E559 Vitamin D deficiency, unspecified: Secondary | ICD-10-CM

## 2018-05-10 DIAGNOSIS — I10 Essential (primary) hypertension: Secondary | ICD-10-CM

## 2018-05-10 DIAGNOSIS — J449 Chronic obstructive pulmonary disease, unspecified: Secondary | ICD-10-CM

## 2018-05-10 DIAGNOSIS — K219 Gastro-esophageal reflux disease without esophagitis: Secondary | ICD-10-CM

## 2018-05-10 DIAGNOSIS — Z0000 Encounter for general adult medical examination without abnormal findings: Secondary | ICD-10-CM

## 2018-05-10 DIAGNOSIS — Z6824 Body mass index (BMI) 24.0-24.9, adult: Secondary | ICD-10-CM

## 2018-05-10 DIAGNOSIS — Z0001 Encounter for general adult medical examination with abnormal findings: Secondary | ICD-10-CM | POA: Diagnosis not present

## 2018-05-10 DIAGNOSIS — K589 Irritable bowel syndrome without diarrhea: Secondary | ICD-10-CM

## 2018-05-10 DIAGNOSIS — I7300 Raynaud's syndrome without gangrene: Secondary | ICD-10-CM

## 2018-05-10 DIAGNOSIS — Z79899 Other long term (current) drug therapy: Secondary | ICD-10-CM

## 2018-05-10 MED ORDER — SUCRALFATE 1 G PO TABS: 1.0000 g | ORAL_TABLET | Freq: Four times a day (QID) | ORAL | 1 refills | Status: DC

## 2018-05-10 NOTE — Patient Instructions (Addendum)
Jasmine Reeves , Thank you for taking time to come for your Medicare Wellness Visit. I appreciate your ongoing commitment to your health goals. Please review the following plan we discussed and let me know if I can assist you in the future.   These are the goals we discussed: Goals    . Blood Pressure < 130/80    . Exercise 150 min/wk Moderate Activity       This is a list of the screening recommended for you and due dates:  Health Maintenance  Topic Date Due  . Colon Cancer Screening  01/03/2020  . Tetanus Vaccine  02/11/2028  . Flu Shot  Completed  . DEXA scan (bone density measurement)  Completed  . Pneumonia vaccines  Completed   Please bring paperwork for medical power of attorney, living will, etc to put in chart   Can crush/dissolve carafate/sucralfate in 2-4 ounces of water and drink to help coat stomach and esophagus     Sucralfate tablets What is this medicine? SUCRALFATE (SOO kral fate) helps to treat ulcers of the intestine. This medicine may be used for other purposes; ask your health care provider or pharmacist if you have questions. COMMON BRAND NAME(S): Carafate What should I tell my health care provider before I take this medicine? They need to know if you have any of these conditions: -kidney disease -an unusual or allergic reaction to sucralfate, other medicines, foods, dyes, or preservatives -pregnant or trying to get pregnant -breast-feeding How should I use this medicine? Take this medicine by mouth with a glass of water. Follow the directions on the prescription label. This medicine works best if you take it on an empty stomach, 1 hour before meals. Take your doses at regular intervals. Do not take your medicine more often than directed. Do not stop taking except on your doctor's advice. Talk to your pediatrician regarding the use of this medicine in children. Special care may be needed. Overdosage: If you think you have taken too much of this medicine  contact a poison control center or emergency room at once. NOTE: This medicine is only for you. Do not share this medicine with others. What if I miss a dose? If you miss a dose, take it as soon as you can. If it is almost time for your next dose, take only that dose. Do not take double or extra doses. What may interact with this medicine? -antacid -cimetidine -digoxin -ketoconazole -phenytoin -quinidine -ranitidine -some antibiotics like ciprofloxacin, norfloxacin, and ofloxacin -theophylline -thyroid hormones -warfarin This list may not describe all possible interactions. Give your health care provider a list of all the medicines, herbs, non-prescription drugs, or dietary supplements you use. Also tell them if you smoke, drink alcohol, or use illegal drugs. Some items may interact with your medicine. What should I watch for while using this medicine? Visit your doctor or health care professional for regular check ups. Let your doctor know if your symptoms do not improve or if you feel worse. Antacids should not be taken within one half hour before or after this medicine. What side effects may I notice from receiving this medicine? Side effects that you should report to your doctor or health care professional as soon as possible: -allergic reactions like skin rash, itching or hives, swelling of the face, lips, or tongue -difficulty breathing Side effects that usually do not require medical attention (report to your doctor or health care professional if they continue or are bothersome): -back pain -constipation -drowsy, dizzy -  dry mouth -headache -stomach upset, gas -trouble sleeping This list may not describe all possible side effects. Call your doctor for medical advice about side effects. You may report side effects to FDA at 1-800-FDA-1088. Where should I keep my medicine? Keep out of the reach of children. Store at room temperature between 15 and 30 degrees C (59 and 86  degrees F). Keep container tightly closed. Throw away any unused medicine after the expiration date. NOTE: This sheet is a summary. It may not cover all possible information. If you have questions about this medicine, talk to your doctor, pharmacist, or health care provider.  2019 Elsevier/Gold Standard (2007-10-18 15:46:20)

## 2018-05-11 LAB — COMPLETE METABOLIC PANEL WITH GFR
AG Ratio: 2 (calc) (ref 1.0–2.5)
ALT: 23 U/L (ref 6–29)
AST: 26 U/L (ref 10–35)
Albumin: 4.5 g/dL (ref 3.6–5.1)
Alkaline phosphatase (APISO): 67 U/L (ref 37–153)
BUN: 19 mg/dL (ref 7–25)
CO2: 28 mmol/L (ref 20–32)
Calcium: 9.9 mg/dL (ref 8.6–10.4)
Chloride: 102 mmol/L (ref 98–110)
Creat: 0.73 mg/dL (ref 0.60–0.93)
GFR, Est African American: 93 mL/min/{1.73_m2} (ref >=60)
GFR, Est Non African American: 81 mL/min/{1.73_m2} (ref >=60)
Globulin: 2.2 g/dL (calc) (ref 1.9–3.7)
Glucose, Bld: 88 mg/dL (ref 65–99)
Potassium: 4.1 mmol/L (ref 3.5–5.3)
Sodium: 140 mmol/L (ref 135–146)
Total Bilirubin: 0.6 mg/dL (ref 0.2–1.2)
Total Protein: 6.7 g/dL (ref 6.1–8.1)

## 2018-05-11 LAB — LIPID PANEL
Cholesterol: 164 mg/dL (ref <200)
HDL: 70 mg/dL (ref >=50)
LDL Cholesterol (Calc): 75 mg/dL (calc)
Non-HDL Cholesterol (Calc): 94 mg/dL (calc) (ref <130)
Total CHOL/HDL Ratio: 2.3 (calc) (ref <5.0)
Triglycerides: 109 mg/dL (ref <150)

## 2018-05-11 LAB — CBC WITH DIFFERENTIAL/PLATELET
Absolute Monocytes: 431 cells/uL (ref 200–950)
Basophils Absolute: 28 cells/uL (ref 0–200)
Basophils Relative: 0.5 %
Eosinophils Absolute: 213 cells/uL (ref 15–500)
Eosinophils Relative: 3.8 %
HCT: 38.3 % (ref 35.0–45.0)
Hemoglobin: 13 g/dL (ref 11.7–15.5)
Lymphs Abs: 1361 cells/uL (ref 850–3900)
MCH: 31.3 pg (ref 27.0–33.0)
MCHC: 33.9 g/dL (ref 32.0–36.0)
MCV: 92.3 fL (ref 80.0–100.0)
MPV: 9.9 fL (ref 7.5–12.5)
Monocytes Relative: 7.7 %
Neutro Abs: 3567 cells/uL (ref 1500–7800)
Neutrophils Relative %: 63.7 %
Platelets: 193 10*3/uL (ref 140–400)
RBC: 4.15 10*6/uL (ref 3.80–5.10)
RDW: 11.4 % (ref 11.0–15.0)
Total Lymphocyte: 24.3 %
WBC: 5.6 10*3/uL (ref 3.8–10.8)

## 2018-05-11 LAB — MAGNESIUM: Magnesium: 2.1 mg/dL (ref 1.5–2.5)

## 2018-05-11 LAB — TSH: TSH: 3.49 mIU/L (ref 0.40–4.50)

## 2018-05-30 ENCOUNTER — Other Ambulatory Visit: Payer: Medicare Other | Admitting: Internal Medicine

## 2018-05-30 DIAGNOSIS — J069 Acute upper respiratory infection, unspecified: Secondary | ICD-10-CM

## 2018-05-30 MED ORDER — PREDNISONE 20 MG PO TABS: ORAL_TABLET | ORAL | 0 refills | Status: DC

## 2018-05-30 MED ORDER — DOXYCYCLINE HYCLATE 100 MG PO CAPS: ORAL_CAPSULE | ORAL | 0 refills | Status: DC

## 2018-05-30 NOTE — Progress Notes (Signed)
[facility]  [name], M.D.  Dyanne Carrel. Steffanie Dunn, P.A.-C Judd Gaudier, DNP [facility] [address]  [place], [place]. [address] Telephone [phone] Telefax [phone]   THIS ENCOUNTER IS A VIRTUAL VISIT DUE TO COVID-19 - PATIENT WAS NOT SEEN IN THE OFFICE.  PATIENT HAS CONSENTED TO VIRTUAL VISIT / TELEMEDICINE VISIT   Virtual Visit via telephone Note  I connected with Jasmine Reeves on 05/30/18  by telephone.  I verified that I am speaking with the correct person using two identifiers.    I discussed the limitations of evaluation and management by telemedicine and the availability of in person appointments. The patient expressed understanding and agreed to proceed.  History of Present Illness:        Patient is a very nice 78 y.o. MWF  with HTN, ASCAD / CABG,  HLD, Pre-Diabetes and Vitamin D Deficiency who calls c/o of a prodrome of head congestion, runny nose - clear drainage , chills, achy w/o fever and no CP,  cough or SOB.  No GI or GU sx's reports self quarantined for last 2 weeks and no suspect exposures.   Medications   Current Outpatient Medications (Cardiovascular):  Marland Kitchen  DILT-XR 180 MG 24 hr capsule, TAKE 1 CAPSULE BY MOUTH DAILY .  labetalol (NORMODYNE) 100 MG tablet, TAKE 1 AND 1/2 TABLETS BY MOUTH TWICE DAILY .  nitroGLYCERIN (NITROSTAT) 0.4 MG SL tablet, Place 1 tablet (0.4 mg total) under the tongue every 5 (five) minutes as needed for chest pain. Marland Kitchen  VYTORIN 10-20 MG tablet, TAKE 1 TABLET BY MOUTH EVERY DAY .  fexofenadine (ALLEGRA) 180 MG tablet, Take 180 mg by mouth daily. .  fluticasone (VERAMYST) 27.5 MCG/SPRAY nasal spray, Place 2 sprays into the nose daily as needed for rhinitis. Marland Kitchen  aspirin 81 MG tablet, Take 81 mg by mouth daily. .  meloxicam (MOBIC) 15 MG tablet, TAKE 1/2 TO 1 TABLET BY MOUTH DAILY ON A FULL STOMACH FOR PAIN AND INFLAMMATION .  IRON PO, Take 50 mg by mouth  daily. Marland Kitchen  CALCIUM CITRATE PO, Take 500 mg by mouth daily. .  Cholecalciferol (VITAMIN D) 2000 UNITS tablet, Take 2,000 Units by mouth 2 (two) times daily. .  Magnesium 400 MG CAPS, Take by mouth daily. Marland Kitchen  METHOCARBAMOL PO, Take by mouth as needed. .  Multiple Vitamins-Minerals (MULTIVITAMIN WITH MINERALS) tablet, Take 1 tablet by mouth daily. .  pantoprazole (PROTONIX) 40 MG tablet, TAKE 1 TABLET BY MOUTH TWICE DAILY 30 MINUTES BEFORE BREAKFAST AND DINNER .  sucralfate (CARAFATE) 1 g tablet, TAKE 1 TABLET(1 GRAM) BY MOUTH FOUR TIMES DAILY .  triamcinolone ointment (KENALOG) 0.1 %, Apply 1 application topically 2 (two) times daily. (Patient taking differently: Apply 1 application topically as needed. ) .  vitamin C (ASCORBIC ACID) 500 MG tablet, Take 500 mg by mouth daily.   Allergies  Allergen Reactions  . Altace [Ramipril]     cough  . Azithromycin     palpitation  . Celebrex [Celecoxib]     Gi upset   . Hydrochlorothiazide     cramping  . 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 Prilosec [Omeprazole]     constipation   Problem list She has Essential hypertension; RAYNAUDS SYNDROME; COPD (chronic obstructive pulmonary disease) (HCC); Vitamin D deficiency; GERD (gastroesophageal reflux disease); CAD (coronary artery disease); MVP (mitral valve prolapse); Mixed hyperlipidemia; Abnormal blood sugar; Medication management; IBS (irritable  bowel syndrome); Encounter for Medicare annual wellness exam; OSA on CPAP; Osteopenia; BMI 24.0-24.9, adult; and Aortic atherosclerosis (HCC) on their problem list.   Observations/Objective:  Speech fluent w/o obvious stridor. No cough, hoarseness or wheezing.   Assessment and Plan:  1. Viral URI  - predniSONE (DELTASONE) 20 MG tablet; 1 tab 3 x day for 2 days, then 1 tab 2 x day for 2 days, then 1 tab 1 x day for 3 days  Dispense: 13 tablet; Refill: 0  - doxycycline 100 MG capsule; Take 1 capsule 2 x /day with food   for respiratory or skin infection  Dispense: 20 capsule Instructed to hold unless developed purulent respiratory secretions.  Follow Up Instructions:     I discussed the assessment and treatment plan with the patient. The patient was provided an opportunity to ask questions and all were answered. The patient agreed with the plan and demonstrated an understanding of the instructions.      The patient was advised to call back or seek an in-person evaluation if the symptoms worsen or if the condition fails to improve as anticipated.  I provided  18 minutes of non-face-to-face time during this encounter and over 24 minutes of exam, counseling, chart review and high complex critical decision making was performed  Marinus Maw, MD

## 2018-07-17 ENCOUNTER — Other Ambulatory Visit: Payer: Self-pay | Admitting: Internal Medicine

## 2018-08-15 ENCOUNTER — Encounter: Payer: Self-pay | Admitting: Internal Medicine

## 2018-08-19 ENCOUNTER — Other Ambulatory Visit: Payer: Self-pay | Admitting: Internal Medicine

## 2018-10-06 ENCOUNTER — Other Ambulatory Visit: Payer: Self-pay | Admitting: Internal Medicine

## 2018-10-06 DIAGNOSIS — M778 Other enthesopathies, not elsewhere classified: Secondary | ICD-10-CM

## 2018-10-23 NOTE — Progress Notes (Signed)
Complete Physical  Assessment and Plan:   Jasmine StacksWilma was seen today for annual exam.  Diagnoses and all orders for this visit:  Encounter for general adult medical examination with abnormal findings -     CBC with Differential/Platelet -     COMPLETE METABOLIC PANEL WITH GFR -     Magnesium  Essential hypertension -     CBC with Differential/Platelet -     COMPLETE METABOLIC PANEL WITH GFR -     Magnesium -     TSH Taking Diltiazam 180mg   Labetalol 100mg   MVP (mitral valve prolapse) Follows with Cardiology  Raynaud's disease without gangrene Doing well at this time  Chronic obstructive pulmonary disease, unspecified COPD type (HCC) Doing well at this time  OSA on CPAP Doing well at this time  Gastroesophageal reflux disease, esophagitis presence not specified Welll controlled Continue PPI/H2 blocker,Protonix 40mg  daily Diet discussed  Irritable bowel syndrome, unspecified type Doing well at this time  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) Control B/P, lipids and glucose Continue to monitor Follows with cardiology  Aortic atherosclerosis (HCC) Control B/P, lipids and glucose Continue to monitor Follows with cardiology  Osteopenia of left forearm Continue viatamin D Supplementation DEXA Vitamin D deficiency -     VITAMIN D 25 Hydroxy (Vit-D Deficiency, Fractures)  Medication management -     TSH  Screening for cardiovascular condition -     EKG 12-Lead  Screening for blood or protein in urine -     Urinalysis w microscopic + reflex cultur  Mixed hyperlipidemia -     Lipid panel  BMI 24.0-24.9, adult  Abnormal glucose -     Hemoglobin A1c -     Insulin, random -     Urinalysis w microscopic + reflex cultur  Otalgia, bilateral -     Ear Lavage  Impacted cerumen of left ear -     Ear Lavage     Discussed med's effects and SE's. Screening labs and tests as requested  with regular follow-up as recommended. Over 40 minutes of exam, counseling, chart review, and complex, high level critical decision making was performed this visit.   HPI  78 y.o. female  presents for a complete physical and follow up for has Essential hypertension; RAYNAUDS SYNDROME; COPD (chronic obstructive pulmonary disease) (HCC); Vitamin D deficiency; GERD (gastroesophageal reflux disease); CAD (coronary artery disease); MVP (mitral valve prolapse); Mixed hyperlipidemia; Abnormal blood sugar; Medication management; IBS (irritable bowel syndrome); Encounter for Medicare annual wellness exam; OSA on CPAP; Osteopenia; BMI 24.0-24.9, adult; and Aortic atherosclerosis (HCC) on their problem list..  She take allegra every night and also uses flonase daily. She reports  She has tried Zyrtec and has adverse side effects.  Claritin  Since COVID she has been  Order her groceries.  She   Her blood pressure has been controlled at home, today their BP is BP: 124/86 She does workout. She denies chest pain, shortness of breath, dizziness. 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  She is not on cholesterol medication and denies myalgias. Her cholesterol is at goal. The cholesterol last visit was:   Lab Results  Component Value Date   CHOL 164 05/10/2018   HDL 70 05/10/2018   LDLCALC 75 05/10/2018   TRIG 109 05/10/2018   CHOLHDL 2.3 05/10/2018    She has been  working on diet and exercise for prediabetes, she is on bASA, she is not on ACE/ARB and denies polydipsia, polyuria, visual disturbances, vomiting and weight loss. Last A1C in the office was:  Lab Results  Component Value Date   HGBA1C 5.6 02/07/2018    Last GFR: Lab Results  Component Value Date   GFRNONAA 81 05/10/2018   Lab Results  Component Value Date   GFRAA 93 05/10/2018    Patient is on Vitamin D supplement.   Lab Results  Component Value Date   VD25OH 66  02/07/2018      Current Medications:  Current Outpatient Medications on File Prior to Visit  Medication Sig Dispense Refill  . aspirin 81 MG tablet Take 81 mg by mouth daily.    Marland Kitchen CALCIUM CITRATE PO Take 500 mg by mouth daily.    . Cholecalciferol (VITAMIN D) 2000 UNITS tablet Take 2,000 Units by mouth 2 (two) times daily.    Marland Kitchen DILT-XR 180 MG 24 hr capsule TAKE 1 CAPSULE BY MOUTH DAILY 90 capsule 3  . fexofenadine (ALLEGRA) 180 MG tablet Take 180 mg by mouth daily.    . fluticasone (VERAMYST) 27.5 MCG/SPRAY nasal spray Place 2 sprays into the nose daily as needed for rhinitis. 10 g 8  . IRON PO Take 50 mg by mouth daily.    Marland Kitchen labetalol (NORMODYNE) 100 MG tablet TAKE 1 AND 1/2 TABLETS BY MOUTH TWICE DAILY 270 tablet 3  . Magnesium 400 MG CAPS Take by mouth daily.    . meloxicam (MOBIC) 15 MG tablet Take 1/2 to 1 tablet Daily with Food for Pain & Inflammation 90 tablet 3  . METHOCARBAMOL PO Take by mouth as needed.    . Multiple Vitamins-Minerals (MULTIVITAMIN WITH MINERALS) tablet Take 1 tablet by mouth daily.    . nitroGLYCERIN (NITROSTAT) 0.4 MG SL tablet Place 1 tablet (0.4 mg total) under the tongue every 5 (five) minutes as needed for chest pain. 25 tablet 12  . pantoprazole (PROTONIX) 40 MG tablet Take 1 tablet 2 x /day for Acid Indigestion & Reflux 180 tablet 3  . sucralfate (CARAFATE) 1 g tablet TAKE 1 TABLET(1 GRAM) BY MOUTH FOUR TIMES DAILY 360 tablet 0  . triamcinolone ointment (KENALOG) 0.1 % Apply 1 application topically 2 (two) times daily. (Patient taking differently: Apply 1 application topically as needed. ) 80 g 1  . vitamin C (ASCORBIC ACID) 500 MG tablet Take 500 mg by mouth daily.    Marland Kitchen VYTORIN 10-20 MG tablet TAKE 1 TABLET BY MOUTH EVERY DAY 90 tablet 3   No current facility-administered medications on file prior to visit.    Allergies:  Allergies  Allergen Reactions  . Altace [Ramipril]     cough  . Azithromycin     palpitation  . Celebrex [Celecoxib]     Gi  upset   . Hydrochlorothiazide     cramping  . 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 Prilosec [Omeprazole]     constipation   Medical History:  She has Essential hypertension; RAYNAUDS SYNDROME; COPD (chronic obstructive pulmonary disease) ([place]); Vitamin D deficiency; GERD (gastroesophageal reflux disease); CAD (coronary artery disease); MVP (mitral valve prolapse); Mixed hyperlipidemia; Abnormal blood sugar; Medication management; IBS (irritable bowel syndrome); Encounter for Medicare annual wellness exam; OSA on CPAP; Osteopenia; BMI 24.0-24.9, adult; and Aortic atherosclerosis ([place]) on their problem list. Health Maintenance:   Immunization History  Administered Date(s) Administered  . Influenza, High Dose Seasonal PF  11/11/2014, 12/08/2015  . Influenza,inj,Quad PF,6+ Mos 11/27/2016, 11/20/2017  . Pneumococcal Conjugate-13 04/16/2014  . Pneumococcal-Unspecified 03/01/1998, 03/12/2010  . Td 03/27/2012  . Tdap 02/10/2018  . Zoster 03/02/2007    Tetanus: 2019 Pneumovax:2012 Prevnar 13: 2016 Flu vaccine: Due for 2020 Zostavax: Discussed  MGM: 2018 DEXA: 2018, due 2021 Colonoscopy: 2014  Last Dental Exam: Due for 2020 Last Eye Exam: Due for 2020  Patient Care Team: [name], [name], MD as PCP - General (Internal Medicine) Lennette BihariKelly, Thomas A, MD as Consulting Physician (Cardiology) Bernette RedbirdBuccini, Robert, MD as Consulting Physician (Gastroenterology) Freddy FinnerNeal, W Ronald, MD as Consulting Physician (Obstetrics and Gynecology)  Surgical History:  She has a past surgical history that includes Tonsillectomy; Appendectomy; Abdominal hysterectomy; Tubal ligation; Coronary artery bypass graft; Coronary angioplasty (12/11); and Esophagogastroduodenoscopy (07/21/12). Family History:  Herfamily history includes Heart disease in her brother; Hyperlipidemia in her brother. Social History:  She reports that she quit smoking about 53 years ago. She has never used smokeless  tobacco. She reports current alcohol use of about 3.0 standard drinks of alcohol per week. She reports that she does not use drugs.  Review of Systems: Review of Systems  Constitutional: Negative for chills, diaphoresis, fever, malaise/fatigue and weight loss.  HENT: Negative for congestion, ear discharge, ear pain, hearing loss, nosebleeds, sinus pain, sore throat and tinnitus.   Eyes: Negative for blurred vision, double vision, photophobia, pain, discharge and redness.  Respiratory: Negative for cough, hemoptysis, sputum production, shortness of breath, wheezing and stridor.   Cardiovascular: Negative for chest pain, palpitations, orthopnea, claudication, leg swelling and PND.  Gastrointestinal: Negative for abdominal pain, blood in stool, constipation, diarrhea, heartburn, melena, nausea and vomiting.  Genitourinary: Negative for dysuria, flank pain, frequency, hematuria and urgency.  Musculoskeletal: Negative for back pain, falls, joint pain, myalgias and neck pain.  Skin: Negative for itching and rash.  Neurological: Negative for dizziness, tingling, tremors, sensory change, speech change, focal weakness, seizures, loss of consciousness, weakness and headaches.  Endo/Heme/Allergies: Negative for environmental allergies and polydipsia. Does not bruise/bleed easily.  Psychiatric/Behavioral: Negative for depression, hallucinations, memory loss, substance abuse and suicidal ideas. The patient is not nervous/anxious and does not have insomnia.     Physical Exam: Estimated body mass index is 24.52 kg/m as calculated from the following:   Height as of this encounter: 5\' 3"  (1.6 m).   Weight as of this encounter: 138 lb 6.4 oz (62.8 kg). BP 124/86   Pulse 69   Temp 97.7 F (36.5 C)   Ht 5\' 3"  (1.6 m)   Wt 138 lb 6.4 oz (62.8 kg)   SpO2 98%   BMI 24.52 kg/m  General Appearance: Well nourished, in no apparent distress.  Eyes: PERRLA, EOMs, conjunctiva no swelling or erythema, normal fundi  and vessels.  Sinuses: No Frontal/maxillary tenderness  ENT/Mouth: Ext aud canals clear, normal light reflex with TMs without erythema, bulging. Good dentition. No erythema, swelling, or exudate on post pharynx. Tonsils not swollen or erythematous. Hearing normal.  Neck: Supple, thyroid normal. No bruits  Respiratory: Respiratory effort normal, BS equal bilaterally without rales, rhonchi, wheezing or stridor.  Cardio: RRR without murmurs, rubs or gallops. Brisk peripheral pulses without edema.  Chest: symmetric, with normal excursions and percussion.  Breasts: Symmetric, without lumps, nipple discharge, retractions.  Abdomen: Soft, nontender, no guarding, rebound, hernias, masses, or organomegaly.  Lymphatics: Non tender without lymphadenopathy.  Musculoskeletal: Full ROM all peripheral extremities,5/5 strength, and normal gait.  Skin: Warm, dry without rashes, lesions, ecchymosis. Neuro: Cranial nerves  intact, reflexes equal bilaterally. Normal muscle tone, no cerebellar symptoms. Sensation intact.  Psych: Awake and oriented X 3, normal affect, Insight and Judgment appropriate.   EKG: WNL no ST changes. AORTA SCAN: WNL   Jasmine Reeves 10:15 AM [facility]

## 2018-10-24 ENCOUNTER — Ambulatory Visit: Payer: Medicare Other | Admitting: Adult Health Nurse Practitioner

## 2018-10-24 ENCOUNTER — Encounter: Payer: Self-pay | Admitting: Adult Health Nurse Practitioner

## 2018-10-24 ENCOUNTER — Other Ambulatory Visit: Payer: Self-pay

## 2018-10-24 VITALS — BP 124/86 | HR 69 | Temp 97.7°F | Ht 63.0 in | Wt 138.4 lb

## 2018-10-24 DIAGNOSIS — Z0001 Encounter for general adult medical examination with abnormal findings: Secondary | ICD-10-CM

## 2018-10-24 DIAGNOSIS — I341 Nonrheumatic mitral (valve) prolapse: Secondary | ICD-10-CM

## 2018-10-24 DIAGNOSIS — I25118 Atherosclerotic heart disease of native coronary artery with other forms of angina pectoris: Secondary | ICD-10-CM | POA: Diagnosis not present

## 2018-10-24 DIAGNOSIS — K219 Gastro-esophageal reflux disease without esophagitis: Secondary | ICD-10-CM

## 2018-10-24 DIAGNOSIS — H6122 Impacted cerumen, left ear: Secondary | ICD-10-CM | POA: Diagnosis not present

## 2018-10-24 DIAGNOSIS — R6889 Other general symptoms and signs: Secondary | ICD-10-CM

## 2018-10-24 DIAGNOSIS — K589 Irritable bowel syndrome without diarrhea: Secondary | ICD-10-CM

## 2018-10-24 DIAGNOSIS — Z79899 Other long term (current) drug therapy: Secondary | ICD-10-CM

## 2018-10-24 DIAGNOSIS — J449 Chronic obstructive pulmonary disease, unspecified: Secondary | ICD-10-CM

## 2018-10-24 DIAGNOSIS — Z136 Encounter for screening for cardiovascular disorders: Secondary | ICD-10-CM

## 2018-10-24 DIAGNOSIS — E782 Mixed hyperlipidemia: Secondary | ICD-10-CM

## 2018-10-24 DIAGNOSIS — I7300 Raynaud's syndrome without gangrene: Secondary | ICD-10-CM

## 2018-10-24 DIAGNOSIS — I10 Essential (primary) hypertension: Secondary | ICD-10-CM

## 2018-10-24 DIAGNOSIS — G4733 Obstructive sleep apnea (adult) (pediatric): Secondary | ICD-10-CM

## 2018-10-24 DIAGNOSIS — Z1389 Encounter for screening for other disorder: Secondary | ICD-10-CM

## 2018-10-24 DIAGNOSIS — M85832 Other specified disorders of bone density and structure, left forearm: Secondary | ICD-10-CM

## 2018-10-24 DIAGNOSIS — Z6824 Body mass index (BMI) 24.0-24.9, adult: Secondary | ICD-10-CM

## 2018-10-24 DIAGNOSIS — H9203 Otalgia, bilateral: Secondary | ICD-10-CM

## 2018-10-24 DIAGNOSIS — E559 Vitamin D deficiency, unspecified: Secondary | ICD-10-CM

## 2018-10-24 DIAGNOSIS — I700 Atherosclerosis of aorta: Secondary | ICD-10-CM

## 2018-10-24 DIAGNOSIS — R7309 Other abnormal glucose: Secondary | ICD-10-CM

## 2018-10-24 NOTE — Patient Instructions (Addendum)
We will contact you via MyChart in 1-3 days with your lab results.  Continue Allegra nightly  The choices below you may take along with your Allegra.  You can try Claritin during the day.   Xyzal is another you can take, this is similar to Zyrtec.  Since you battle with allergies, finding the right combination and changing them around to find what works best for you.   Keep up the good work with your walking!  Continue walking a mile and increase your distance or time walking slowly.  Use a dropper or use a cap to put peroxide, olive oil,mineral oil or canola oil in the effected ear- 2-3 times a week. Let it soak for 20-30 min then you can take a shower or use a baby bulb with warm water to wash out the ear wax.  Can buy debrox wax removal kit over the counter.  Do not use Qtips      Vit D  & Vit C 1,000 mg   are recommended to help protect  against the Covid-19 and other Corona viruses.    Also it's recommended  to take  Zinc 50 mg  to help  protect against the Covid-19   and best place to get  is also on [employer].com  and don't pay more than 6-8 cents /pill !  ================================ Coronavirus (COVID-19) Are you at risk?  Are you at risk for the Coronavirus (COVID-19)?  To be considered HIGH RISK for Coronavirus (COVID-19), you have to meet the following criteria:  . Traveled to [place], [place], [place], [place] or [place]; or in the [place] to [place], [place], [place]  . or [place]; and have fever, cough, and shortness of breath within the last 2 weeks of travel OR . Been in close contact with a person diagnosed with COVID-19 within the last 2 weeks and have  . fever, cough,and shortness of breath .  . IF YOU DO NOT MEET THESE CRITERIA, YOU ARE CONSIDERED LOW RISK FOR COVID-19.  What to do if you are HIGH RISK for COVID-19?  Marland Kitchen If you are having a medical emergency, call 911. . Seek medical care right away. Before you go to a doctor's office,  urgent care or emergency department, .  call ahead and tell them about your recent travel, contact with someone diagnosed with COVID-19  .  and your symptoms.  . You should receive instructions from your physician's office regarding next steps of care.  . When you arrive at healthcare provider, tell the healthcare staff immediately you have returned from  . visiting [place], [place], [place], [place] or [place]; or traveled in the [place] to [place], [place],  . [place] or [place] in the last two weeks or you have been in close contact with a person diagnosed with  . COVID-19 in the last 2 weeks.   . Tell the health care staff about your symptoms: fever, cough and shortness of breath. . After you have been seen by a medical provider, you will be either: o Tested for (COVID-19) and discharged home on quarantine except to seek medical care if  o symptoms worsen, and asked to  - Stay home and avoid contact with others until you get your results (4-5 days)  - Avoid travel on public transportation if possible (such as bus, train, or airplane) or o Sent to the Emergency Department by EMS for evaluation, COVID-19 testing  and  o possible admission depending  on your condition and test results.  What to do if you are LOW RISK for COVID-19?  Reduce your risk of any infection by using the same precautions used for avoiding the common cold or flu:  Marland Kitchen Wash your hands often with soap and warm water for at least 20 seconds.  If soap and water are not readily available,  . use an alcohol-based hand sanitizer with at least 60% alcohol.  . If coughing or sneezing, cover your mouth and nose by coughing or sneezing into the elbow areas of your shirt or coat, .  into a tissue or into your sleeve (not your hands). . Avoid shaking hands with others and consider head nods or verbal greetings only. . Avoid touching your eyes, nose, or mouth with unwashed hands.  . Avoid close contact with people who are  sick. . Avoid places or events with large numbers of people in one location, like concerts or sporting events. . Carefully consider travel plans you have or are making. . If you are planning any travel outside or inside the [place], visit the CDC's Travelers' Health webpage for the latest health notices. . If you have some symptoms but not all symptoms, continue to monitor at home and seek medical attention  . if your symptoms worsen. . If you are having a medical emergency, call 911.   . >>>>>>>>>>>>>>>>>>>>>>>>>>>>>>>>> . We Do NOT Approve of  [facility], [employer]  Our Patients  To Do Home Visits & We Do NOT Approve of [employer] > > > > > > > > > > > > > > > > > > > > > > > > > > > > > > > > > > > > > > >  Preventive Care for Adults  A healthy lifestyle and preventive care can promote health and wellness. Preventive health guidelines for women include the following key practices.  A routine yearly physical is a good way to check with your health care provider about your health and preventive screening. It is a chance to share any concerns and updates on your health and to receive a thorough exam.  Visit your dentist for a routine exam and preventive care every 6 months. Brush your teeth twice a day and floss once a day. Good oral hygiene prevents tooth decay and gum disease.  The frequency of eye exams is based on your age, health, family medical history, use of contact lenses, and other factors. Follow your health care provider's recommendations for frequency of eye exams.  Eat a healthy diet. Foods like vegetables, fruits, whole grains, low-fat dairy products, and lean protein foods contain the nutrients you need without too many calories. Decrease your intake of foods high in solid fats, added sugars, and salt. Eat the right amount of calories for you. Get information about a proper diet from your health care provider, if necessary.  Regular physical  exercise is one of the most important things you can do for your health. Most adults should get at least 150 minutes of moderate-intensity exercise (any activity that increases your heart rate and causes you to sweat) each week. In addition, most adults need muscle-strengthening exercises on 2 or more days a week.  Maintain a healthy weight. The body mass index (BMI) is a screening tool to identify possible weight problems. It provides an estimate of body fat based on height and weight. Your health care provider can find your BMI and can help you achieve or maintain  a healthy weight. For adults 20 years and older:  A BMI below 18.5 is considered underweight.  A BMI of 18.5 to 24.9 is normal.  A BMI of 25 to 29.9 is considered overweight.  A BMI of 30 and above is considered obese.  Maintain normal blood lipids and cholesterol levels by exercising and minimizing your intake of saturated fat. Eat a balanced diet with plenty of fruit and vegetables. If your lipid or cholesterol levels are high, you are over 50, or you are at high risk for heart disease, you may need your cholesterol levels checked more frequently. Ongoing high lipid and cholesterol levels should be treated with medicines if diet and exercise are not working.  If you smoke, find out from your health care provider how to quit. If you do not use tobacco, do not start.  Lung cancer screening is recommended for adults aged 41-80 years who are at high risk for developing lung cancer because of a history of smoking. A yearly low-dose CT scan of the lungs is recommended for people who have at least a 30-pack-year history of smoking and are a current smoker or have quit within the past 15 years. A pack year of smoking is smoking an average of 1 pack of cigarettes a day for 1 year (for example: 1 pack a day for 30 years or 2 packs a day for 15 years). Yearly screening should continue until the smoker has stopped smoking for at least 15 years.  Yearly screening should be stopped for people who develop a health problem that would prevent them from having lung cancer treatment.  Avoid use of street drugs. Do not share needles with anyone. Ask for help if you need support or instructions about stopping the use of drugs.  High blood pressure causes heart disease and increases the risk of stroke.  Ongoing high blood pressure should be treated with medicines if weight loss and exercise do not work.  If you are 29-36 years old, ask your health care provider if you should take aspirin to prevent strokes.  Diabetes screening involves taking a blood sample to check your fasting blood sugar level. This should be done once every 3 years, after age 15, if you are within normal weight and without risk factors for diabetes. Testing should be considered at a younger age or be carried out more frequently if you are overweight and have at least 1 risk factor for diabetes.  Breast cancer screening is essential preventive care for women. You should practice "breast self-awareness." This means understanding the normal appearance and feel of your breasts and may include breast self-examination. Any changes detected, no matter how small, should be reported to a health care provider. Women in their 35s and 30s should have a clinical breast exam (CBE) by a health care provider as part of a regular health exam every 1 to 3 years. After age 77, women should have a CBE every year. Starting at age 59, women should consider having a mammogram (breast X-ray test) every year. Women who have a family history of breast cancer should talk to their health care provider about genetic screening. Women at a high risk of breast cancer should talk to their health care providers about having an MRI and a mammogram every year.  Breast cancer gene (BRCA)-related cancer risk assessment is recommended for women who have family members with BRCA-related cancers. BRCA-related cancers include  breast, ovarian, tubal, and peritoneal cancers. Having family members with these cancers may  be associated with an increased risk for harmful changes (mutations) in the breast cancer genes BRCA1 and BRCA2. Results of the assessment will determine the need for genetic counseling and BRCA1 and BRCA2 testing.  Routine pelvic exams to screen for cancer are no longer recommended for nonpregnant women who are considered low risk for cancer of the pelvic organs (ovaries, uterus, and vagina) and who do not have symptoms. Ask your health care provider if a screening pelvic exam is right for you.  If you have had past treatment for cervical cancer or a condition that could lead to cancer, you need Pap tests and screening for cancer for at least 20 years after your treatment. If Pap tests have been discontinued, your risk factors (such as having a new sexual partner) need to be reassessed to determine if screening should be resumed. Some women have medical problems that increase the chance of getting cervical cancer. In these cases, your health care provider may recommend more frequent screening and Pap tests.    Colorectal cancer can be detected and often prevented. Most routine colorectal cancer screening begins at the age of 68 years and continues through age 64 years. However, your health care provider may recommend screening at an earlier age if you have risk factors for colon cancer. On a yearly basis, your health care provider may provide home test kits to check for hidden blood in the stool. Use of a small camera at the end of a tube, to directly examine the colon (sigmoidoscopy or colonoscopy), can detect the earliest forms of colorectal cancer. Talk to your health care provider about this at age 57, when routine screening begins.  Direct exam of the colon should be repeated every 5-10 years through age 27 years, unless early forms of pre-cancerous polyps or small growths are found.  Osteoporosis is a  disease in which the bones lose minerals and strength with aging. This can result in serious bone fractures or breaks. The risk of osteoporosis can be identified using a bone density scan. Women ages 27 years and over and women at risk for fractures or osteoporosis should discuss screening with their health care providers. Ask your health care provider whether you should take a calcium supplement or vitamin D to reduce the rate of osteoporosis.  Menopause can be associated with physical symptoms and risks. Hormone replacement therapy is available to decrease symptoms and risks. You should talk to your health care provider about whether hormone replacement therapy is right for you.  Use sunscreen. Apply sunscreen liberally and repeatedly throughout the day. You should seek shade when your shadow is shorter than you. Protect yourself by wearing long sleeves, pants, a wide-brimmed hat, and sunglasses year round, whenever you are outdoors.  Once a month, do a whole body skin exam, using a mirror to look at the skin on your back. Tell your health care provider of new moles, moles that have irregular borders, moles that are larger than a pencil eraser, or moles that have changed in shape or color.  Stay current with required vaccines (immunizations).  Influenza vaccine. All adults should be immunized every year.  Tetanus, diphtheria, and acellular pertussis (Td, Tdap) vaccine. Pregnant women should receive 1 dose of Tdap vaccine during each pregnancy. The dose should be obtained regardless of the length of time since the last dose. Immunization is preferred during the 27th-36th week of gestation. An adult who has not previously received Tdap or who does not know her vaccine status should receive  1 dose of Tdap. This initial dose should be followed by tetanus and diphtheria toxoids (Td) booster doses every 10 years. Adults with an unknown or incomplete history of completing a 3-dose immunization series with  Td-containing vaccines should begin or complete a primary immunization series including a Tdap dose. Adults should receive a Td booster every 10 years.    Zoster vaccine. One dose is recommended for adults aged 46 years or older unless certain conditions are present.    Pneumococcal 13-valent conjugate (PCV13) vaccine. When indicated, a person who is uncertain of her immunization history and has no record of immunization should receive the PCV13 vaccine. An adult aged 56 years or older who has certain medical conditions and has not been previously immunized should receive 1 dose of PCV13 vaccine. This PCV13 should be followed with a dose of pneumococcal polysaccharide (PPSV23) vaccine. The PPSV23 vaccine dose should be obtained at least 1 or more year(s) after the dose of PCV13 vaccine. An adult aged 79 years or older who has certain medical conditions and previously received 1 or more doses of PPSV23 vaccine should receive 1 dose of PCV13. The PCV13 vaccine dose should be obtained 1 or more years after the last PPSV23 vaccine dose.    Pneumococcal polysaccharide (PPSV23) vaccine. When PCV13 is also indicated, PCV13 should be obtained first. All adults aged 29 years and older should be immunized. An adult younger than age 86 years who has certain medical conditions should be immunized. Any person who resides in a nursing home or long-term care facility should be immunized. An adult smoker should be immunized. People with an immunocompromised condition and certain other conditions should receive both PCV13 and PPSV23 vaccines. People with human immunodeficiency virus (HIV) infection should be immunized as soon as possible after diagnosis. Immunization during chemotherapy or radiation therapy should be avoided. Routine use of PPSV23 vaccine is not recommended for American Indians, [place], or people younger than 65 years unless there are medical conditions that require PPSV23 vaccine. When  indicated, people who have unknown immunization and have no record of immunization should receive PPSV23 vaccine. One-time revaccination 5 years after the first dose of PPSV23 is recommended for people aged 19-64 years who have chronic kidney failure, nephrotic syndrome, asplenia, or immunocompromised conditions. People who received 1-2 doses of PPSV23 before age 50 years should receive another dose of PPSV23 vaccine at age 47 years or later if at least 5 years have passed since the previous dose. Doses of PPSV23 are not needed for people immunized with PPSV23 at or after age 15 years.   Preventive Services / Frequency  Ages 73 years and over  Blood pressure check.  Lipid and cholesterol check.  Lung cancer screening. / Every year if you are aged 32-80 years and have a 30-pack-year history of smoking and currently smoke or have quit within the past 15 years. Yearly screening is stopped once you have quit smoking for at least 15 years or develop a health problem that would prevent you from having lung cancer treatment.  Clinical breast exam.** / Every year after age 65 years.   BRCA-related cancer risk assessment.** / For women who have family members with a BRCA-related cancer (breast, ovarian, tubal, or peritoneal cancers).  Mammogram.** / Every year beginning at age 8 years and continuing for as long as you are in good health. Consult with your health care provider.  Pap test.** / Every 3 years starting at age 48 years through age 54 or 13  years with 3 consecutive normal Pap tests. Testing can be stopped between 65 and 70 years with 3 consecutive normal Pap tests and no abnormal Pap or HPV tests in the past 10 years.  Fecal occult blood test (FOBT) of stool. / Every year beginning at age 81 years and continuing until age 49 years. You may not need to do this test if you get a colonoscopy every 10 years.  Flexible sigmoidoscopy or colonoscopy.** / Every 5 years for a flexible sigmoidoscopy  or every 10 years for a colonoscopy beginning at age 81 years and continuing until age 35 years.  Hepatitis C blood test.** / For all people born from [birth year] through 1963 and any individual with known risks for hepatitis C.  Osteoporosis screening.** / A one-time screening for women ages 73 years and over and women at risk for fractures or osteoporosis.  Skin self-exam. / Monthly.  Influenza vaccine. / Every year.  Tetanus, diphtheria, and acellular pertussis (Tdap/Td) vaccine.** / 1 dose of Td every 10 years.  Zoster vaccine.** / 1 dose for adults aged 58 years or older.  Pneumococcal 13-valent conjugate (PCV13) vaccine.** / Consult your health care provider.  Pneumococcal polysaccharide (PPSV23) vaccine.** / 1 dose for all adults aged 68 years and older. Screening for abdominal aortic aneurysm (AAA)  by ultrasound is recommended for people who have history of high blood pressure or who are current or former smokers. ++++++++++++++++++++ Recommend Adult Low Dose Aspirin or  coated  Aspirin 81 mg daily  To reduce risk of Colon Cancer 40 %,  Skin Cancer 26 % ,  Melanoma 46%  and  Pancreatic cancer 60% ++++++++++++++++++++ Vitamin D goal  is between 70-100.  Please make sure that you are taking your Vitamin D as directed.  It is very important as a natural anti-inflammatory  helping hair, skin, and nails, as well as reducing stroke and heart attack risk.  It helps your bones and helps with mood. It also decreases numerous cancer risks so please take it as directed.  Low Vit D is associated with a 200-300% higher risk for CANCER  and 200-300% higher risk for HEART   ATTACK  &  STROKE.   .....................................Marland Kitchen It is also associated with higher death rate at younger ages,  autoimmune diseases like Rheumatoid arthritis, Lupus, Multiple Sclerosis.    Also many other serious conditions, like depression, Alzheimer's Dementia, infertility, muscle aches, fatigue,  fibromyalgia - just to name a few. ++++++++++++++++++ Recommend the book "The END of DIETING" by Dr Excell Seltzer  & the book "The END of DIABETES " by Dr Excell Seltzer At [email or web address] - get book & Audio CD's    Being diabetic has a  300% increased risk for heart attack, stroke, cancer, and alzheimer- type vascular dementia. It is very important that you work harder with diet by avoiding all foods that are white. Avoid white rice (brown & wild rice is OK), white potatoes (sweetpotatoes in moderation is OK), White bread or wheat bread or anything made out of white flour like bagels, donuts, rolls, buns, biscuits, cakes, pastries, cookies, pizza crust, and pasta (made from white flour & egg whites) - vegetarian pasta or spinach or wheat pasta is OK. Multigrain breads like Arnold's or [employer], or multigrain sandwich thins or flatbreads.  Diet, exercise and weight loss can reverse and cure diabetes in the early stages.  Diet, exercise and weight loss is very important in the control and prevention of complications of diabetes which affects every  system in your body, ie. Brain - dementia/stroke, eyes - glaucoma/blindness, heart - heart attack/heart failure, kidneys - dialysis, stomach - gastric paralysis, intestines - malabsorption, nerves - severe painful neuritis, circulation - gangrene & loss of a leg(s), and finally cancer and Alzheimers.    I recommend avoid fried & greasy foods,  sweets/candy, white rice (brown or wild rice or Quinoa is OK), white potatoes (sweet potatoes are OK) - anything made from white flour - bagels, doughnuts, rolls, buns, biscuits,white and wheat breads, pizza crust and traditional pasta made of white flour & egg white(vegetarian pasta or spinach or wheat pasta is OK).  Multi-grain bread is OK - like multi-grain flat bread or sandwich thins. Avoid alcohol in excess. Exercise is also important.    Eat all the vegetables you want - avoid meat, especially red meat and dairy -  especially cheese.  Cheese is the most concentrated form of trans-fats which is the worst thing to clog up our arteries. Veggie cheese is OK which can be found in the fresh produce section at [employer] or [employer] or [employer]  +++++++++++++++++++ DASH Eating Plan  DASH stands for "Dietary Approaches to Stop Hypertension."   The DASH eating plan is a healthy eating plan that has been shown to reduce high blood pressure (hypertension). Additional health benefits may include reducing the risk of type 2 diabetes mellitus, heart disease, and stroke. The DASH eating plan may also help with weight loss. WHAT DO I NEED TO KNOW ABOUT THE DASH EATING PLAN? For the DASH eating plan, you will follow these general guidelines:  Choose foods with a percent daily value for sodium of less than 5% (as listed on the food label).  Use salt-free seasonings or herbs instead of table salt or sea salt.  Check with your health care provider or pharmacist before using salt substitutes.  Eat lower-sodium products, often labeled as "lower sodium" or "no salt added."  Eat fresh foods.  Eat more vegetables, fruits, and low-fat dairy products.  Choose whole grains. Look for the word "whole" as the first word in the ingredient list.  Choose fish   Limit sweets, desserts, sugars, and sugary drinks.  Choose heart-healthy fats.  Eat veggie cheese   Eat more home-cooked food and less restaurant, buffet, and fast food.  Limit fried foods.  Cook foods using methods other than frying.  Limit canned vegetables. If you do use them, rinse them well to decrease the sodium.  When eating at a restaurant, ask that your food be prepared with less salt, or no salt if possible.                      WHAT FOODS CAN I EAT? Read Dr Fara Olden Fuhrman's books on The End of Dieting & The End of Diabetes  Grains Whole grain or whole wheat bread. Brown rice. Whole grain or whole wheat pasta. Quinoa, bulgur, and whole grain  cereals. Low-sodium cereals. Corn or whole wheat flour tortillas. Whole grain cornbread. Whole grain crackers. Low-sodium crackers.  Vegetables Fresh or frozen vegetables (raw, steamed, roasted, or grilled). Low-sodium or reduced-sodium tomato and vegetable juices. Low-sodium or reduced-sodium tomato sauce and paste. Low-sodium or reduced-sodium canned vegetables.   Fruits All fresh, canned (in natural juice), or frozen fruits.  Protein Products  All fish and seafood.  Dried beans, peas, or lentils. Unsalted nuts and seeds. Unsalted canned beans.  Dairy Low-fat dairy products, such as skim or 1% milk, 2% or reduced-fat cheeses,  low-fat ricotta or cottage cheese, or plain low-fat yogurt. Low-sodium or reduced-sodium cheeses.  Fats and Oils Tub margarines without trans fats. Light or reduced-fat mayonnaise and salad dressings (reduced sodium). Avocado. Safflower, olive, or canola oils. Natural peanut or almond butter.  Other Unsalted popcorn and pretzels. The items listed above may not be a complete list of recommended foods or beverages. Contact your dietitian for more options.  +++++++++++++++  WHAT FOODS ARE NOT RECOMMENDED? Grains/ White flour or wheat flour White bread. White pasta. White rice. Refined cornbread. Bagels and croissants. Crackers that contain trans fat.  Vegetables  Creamed or fried vegetables. Vegetables in a . Regular canned vegetables. Regular canned tomato sauce and paste. Regular tomato and vegetable juices.  Fruits Dried fruits. Canned fruit in light or heavy syrup. Fruit juice.  Meat and Other Protein Products Meat in general - RED meat & White meat.  Fatty cuts of meat. Ribs, chicken wings, all processed meats as bacon, sausage, bologna, salami, fatback, hot dogs, bratwurst and packaged luncheon meats.  Dairy Whole or 2% milk, cream, half-and-half, and cream cheese. Whole-fat or sweetened yogurt. Full-fat cheeses or blue cheese. Non-dairy creamers and  whipped toppings. Processed cheese, cheese spreads, or cheese curds.  Condiments Onion and garlic salt, seasoned salt, table salt, and sea salt. Canned and packaged gravies. Worcestershire sauce. Tartar sauce. Barbecue sauce. Teriyaki sauce. Soy sauce, including reduced sodium. Steak sauce. Fish sauce. Oyster sauce. Cocktail sauce. Horseradish. Ketchup and mustard. Meat flavorings and tenderizers. Bouillon cubes. Hot sauce. Tabasco sauce. Marinades. Taco seasonings. Relishes.  Fats and Oils Butter, stick margarine, lard, shortening and bacon fat. Coconut, palm kernel, or palm oils. Regular salad dressings.  Pickles and olives. Salted popcorn and pretzels.  The items listed above may not be a complete list of foods and beverages to avoid.

## 2018-10-25 LAB — COMPLETE METABOLIC PANEL WITH GFR
AG Ratio: 2.4 (calc) (ref 1.0–2.5)
ALT: 18 U/L (ref 6–29)
AST: 21 U/L (ref 10–35)
Albumin: 4.5 g/dL (ref 3.6–5.1)
Alkaline phosphatase (APISO): 61 U/L (ref 37–153)
BUN: 22 mg/dL (ref 7–25)
CO2: 25 mmol/L (ref 20–32)
Calcium: 9.6 mg/dL (ref 8.6–10.4)
Chloride: 105 mmol/L (ref 98–110)
Creat: 0.8 mg/dL (ref 0.60–0.93)
GFR, Est African American: 83 mL/min/{1.73_m2} (ref >=60)
GFR, Est Non African American: 72 mL/min/{1.73_m2} (ref >=60)
Globulin: 1.9 g/dL (calc) (ref 1.9–3.7)
Glucose, Bld: 106 mg/dL — ABNORMAL HIGH (ref 65–99)
Potassium: 4.2 mmol/L (ref 3.5–5.3)
Sodium: 140 mmol/L (ref 135–146)
Total Bilirubin: 0.7 mg/dL (ref 0.2–1.2)
Total Protein: 6.4 g/dL (ref 6.1–8.1)

## 2018-10-25 LAB — URINALYSIS W MICROSCOPIC + REFLEX CULTURE
Bacteria, UA: NONE SEEN /HPF
Bilirubin Urine: NEGATIVE
Glucose, UA: NEGATIVE
Hgb urine dipstick: NEGATIVE
Hyaline Cast: NONE SEEN /LPF
Ketones, ur: NEGATIVE
Leukocyte Esterase: NEGATIVE
Nitrites, Initial: NEGATIVE
Protein, ur: NEGATIVE
Specific Gravity, Urine: 1.02 (ref 1.001–1.03)
Squamous Epithelial / HPF: NONE SEEN /HPF (ref <=5)
WBC, UA: NONE SEEN /HPF (ref 0–5)
pH: 5.5 (ref 5.0–8.0)

## 2018-10-25 LAB — HEMOGLOBIN A1C
Hgb A1c MFr Bld: 5.8 % of total Hgb — ABNORMAL HIGH (ref <5.7)
Mean Plasma Glucose: 120 (calc)
eAG (mmol/L): 6.6 (calc)

## 2018-10-25 LAB — LIPID PANEL
Cholesterol: 155 mg/dL (ref <200)
HDL: 68 mg/dL (ref >=50)
LDL Cholesterol (Calc): 72 mg/dL (calc)
Non-HDL Cholesterol (Calc): 87 mg/dL (calc) (ref <130)
Total CHOL/HDL Ratio: 2.3 (calc) (ref <5.0)
Triglycerides: 69 mg/dL (ref <150)

## 2018-10-25 LAB — CBC WITH DIFFERENTIAL/PLATELET
Absolute Monocytes: 520 cells/uL (ref 200–950)
Basophils Absolute: 52 cells/uL (ref 0–200)
Basophils Relative: 0.8 %
Eosinophils Absolute: 293 cells/uL (ref 15–500)
Eosinophils Relative: 4.5 %
HCT: 38.8 % (ref 35.0–45.0)
Hemoglobin: 12.9 g/dL (ref 11.7–15.5)
Lymphs Abs: 1294 cells/uL (ref 850–3900)
MCH: 31.1 pg (ref 27.0–33.0)
MCHC: 33.2 g/dL (ref 32.0–36.0)
MCV: 93.5 fL (ref 80.0–100.0)
MPV: 9.7 fL (ref 7.5–12.5)
Monocytes Relative: 8 %
Neutro Abs: 4342 cells/uL (ref 1500–7800)
Neutrophils Relative %: 66.8 %
Platelets: 194 10*3/uL (ref 140–400)
RBC: 4.15 10*6/uL (ref 3.80–5.10)
RDW: 11.8 % (ref 11.0–15.0)
Total Lymphocyte: 19.9 %
WBC: 6.5 10*3/uL (ref 3.8–10.8)

## 2018-10-25 LAB — VITAMIN D 25 HYDROXY (VIT D DEFICIENCY, FRACTURES): Vit D, 25-Hydroxy: 75 ng/mL (ref 30–100)

## 2018-10-25 LAB — INSULIN, RANDOM: Insulin: 8.6 u[IU]/mL

## 2018-10-25 LAB — MAGNESIUM: Magnesium: 2.2 mg/dL (ref 1.5–2.5)

## 2018-10-25 LAB — TSH: TSH: 3.77 mIU/L (ref 0.40–4.50)

## 2018-10-25 LAB — NO CULTURE INDICATED

## 2018-10-27 ENCOUNTER — Encounter: Payer: Self-pay | Admitting: Adult Health Nurse Practitioner

## 2018-11-09 ENCOUNTER — Encounter: Payer: Self-pay | Admitting: Adult Health

## 2018-11-09 ENCOUNTER — Ambulatory Visit: Payer: Medicare Other | Admitting: Adult Health

## 2018-11-09 ENCOUNTER — Other Ambulatory Visit: Payer: Self-pay

## 2018-11-09 VITALS — BP 120/72 | HR 78 | Temp 96.8°F | Ht 63.0 in | Wt 140.0 lb

## 2018-11-09 DIAGNOSIS — R300 Dysuria: Secondary | ICD-10-CM

## 2018-11-09 DIAGNOSIS — R350 Frequency of micturition: Secondary | ICD-10-CM

## 2018-11-09 MED ORDER — SULFAMETHOXAZOLE-TRIMETHOPRIM 800-160 MG PO TABS: 1.0000 | ORAL_TABLET | Freq: Two times a day (BID) | ORAL | 0 refills | Status: DC

## 2018-11-09 NOTE — Progress Notes (Signed)
Assessment and Plan:  Marquesha was seen today for dysuria and urinary frequency.  Diagnoses and all orders for this visit:  Dysuria/frequency UTI versus OAB versus vaginal dryness - will check UA, C&S. Will start ABX now due to pain - push fluids, hygiene reviewed -     Urinalysis w microscopic + reflex cultur -     sulfamethoxazole-trimethoprim (BACTRIM DS) 800-160 MG tablet; Take 1 tablet by mouth 2 (two) times daily.   Further disposition pending results of labs. Discussed med's effects and SE's.   Over 15 minutes of exam, counseling, chart review, and critical decision making was performed.   Future Appointments  Date Time Provider [place]  01/15/2019  4:20 PM Troy Sine, MD [facility]  02/01/2019  9:30 AM Unk Pinto, MD [facility] None  05/14/2019  2:00 PM Liane Comber, NP [facility] None  10/30/2019  9:00 AM Garnet Sierras, NP [facility] None    ------------------------------------------------------------------------------------------------------------------   HPI BP 120/72   Pulse 78   Temp (!) 96.8 F (36 C)   Ht 5\' 3"  (1.6 m)   Wt 140 lb (63.5 kg)   SpO2 98%   BMI 24.80 kg/m   78 y.o.female presents for evaluation of 2 days of urgency and dysuria; denies frequency; she reports she was up urinating frequently throughout the night. She denies fever (did have mild chills this AM), n/v, abd pain, vaginal discharge, flank pain. Denies hematuria, changes in urine character. Denies vaginal discharge or perineal rash or burning.   She does endorse ongoing lower back pain (chronic, unchanged)  She has been pushing fluids but so far no improvement  She is s/p abdominal hysterectomy; ovaries and tubes remain  Past Medical History:  Diagnosis Date  . COPD (chronic obstructive pulmonary disease) ([place])   . Fibromyalgia   . GERD (gastroesophageal reflux disease)   . IBS (irritable bowel syndrome)   . Scoliosis      Allergies  Allergen  Reactions  . Altace [Ramipril]     cough  . Azithromycin     palpitation  . Celebrex [Celecoxib]     Gi upset   . Hydrochlorothiazide     cramping  . 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 Prilosec [Omeprazole]     constipation    Current Outpatient Medications on File Prior to Visit  Medication Sig  . aspirin 81 MG tablet Take 81 mg by mouth daily.  Marland Kitchen CALCIUM CITRATE PO Take 500 mg by mouth daily.  . Cholecalciferol (VITAMIN D) 2000 UNITS tablet Take 2,000 Units by mouth 2 (two) times daily.  Marland Kitchen DILT-XR 180 MG 24 hr capsule TAKE 1 CAPSULE BY MOUTH DAILY  . fexofenadine (ALLEGRA) 180 MG tablet Take 180 mg by mouth daily.  . fluticasone (VERAMYST) 27.5 MCG/SPRAY nasal spray Place 2 sprays into the nose daily as needed for rhinitis.  . IRON PO Take 50 mg by mouth daily.  Marland Kitchen labetalol (NORMODYNE) 100 MG tablet TAKE 1 AND 1/2 TABLETS BY MOUTH TWICE DAILY  . Magnesium 400 MG CAPS Take by mouth daily.  . meloxicam (MOBIC) 15 MG tablet Take 1/2 to 1 tablet Daily with Food for Pain & Inflammation  . METHOCARBAMOL PO Take by mouth as needed.  . Multiple Vitamins-Minerals (MULTIVITAMIN WITH MINERALS) tablet Take 1 tablet by mouth daily.  . nitroGLYCERIN (NITROSTAT) 0.4 MG SL tablet Place 1 tablet (0.4 mg total) under the tongue every 5 (five) minutes as needed for chest pain.  Marland Kitchen  pantoprazole (PROTONIX) 40 MG tablet Take 1 tablet 2 x /day for Acid Indigestion & Reflux  . sucralfate (CARAFATE) 1 g tablet TAKE 1 TABLET(1 GRAM) BY MOUTH FOUR TIMES DAILY  . triamcinolone ointment (KENALOG) 0.1 % Apply 1 application topically 2 (two) times daily. (Patient taking differently: Apply 1 application topically as needed. )  . vitamin C (ASCORBIC ACID) 500 MG tablet Take 500 mg by mouth daily.  Marland Kitchen. VYTORIN 10-20 MG tablet TAKE 1 TABLET BY MOUTH EVERY DAY  . Zinc 50 MG TABS Take by mouth daily.   No current facility-administered medications on file prior to visit.     ROS:  all negative except above.   Physical Exam:  BP 120/72   Pulse 78   Temp (!) 96.8 F (36 C)   Ht 5\' 3"  (1.6 m)   Wt 140 lb (63.5 kg)   SpO2 98%   BMI 24.80 kg/m   General Appearance: Well nourished, in no apparent distress. Eyes: conjunctiva no swelling or erythema ENT/Mouth: Hearing normal.  Neck: Supple Respiratory: Respiratory effort normal, BS equal bilaterally without rales, rhonchi, wheezing or stridor.  Cardio: RRR with no MRGs. Brisk peripheral pulses without edema.  Abdomen: Soft, + BS.  Mild suprapubic tenderness, no guarding, rebound, hernias, masses. Lymphatics: Non tender without lymphadenopathy.  Musculoskeletal: Full ROM, 5/5 strength, normal gait. No spinous tenderness, no SI joint tenderness; no CVA tenderness Skin: Warm, dry without rashes, lesions, ecchymosis.  Neuro: Normal muscle tone, Sensation intact.  Psych: Awake and oriented X 3, normal affect, Insight and Judgment appropriate.     Dan MakerAshley C Quadarius Henton, NP 3:11 PM [facility]

## 2018-11-09 NOTE — Patient Instructions (Signed)
Pick up Azo - 1-2 caps up to three times a day - will make urine neon orange -   If no UTI - might try myrbetriq - given 2 weeks worth of samples - 1 tab daily     Sulfamethoxazole; Trimethoprim, SMX-TMP tablets What is this medicine? SULFAMETHOXAZOLE; TRIMETHOPRIM or SMX-TMP (suhl fuh meth OK suh zohl; trye METH oh prim) is a combination of a sulfonamide antibiotic and a second antibiotic, trimethoprim. It is used to treat or prevent certain kinds of bacterial infections. It will not work for colds, flu, or other viral infections. This medicine may be used for other purposes; ask your health care provider or pharmacist if you have questions. COMMON BRAND NAME(S): Bacter-Aid DS, Bactrim, Bactrim DS, Septra, Septra DS What should I tell my health care provider before I take this medicine? They need to know if you have any of these conditions:  anemia  asthma  being treated with anticonvulsants  if you frequently drink alcohol containing drinks  kidney disease  liver disease  low level of folic acid or [id number]-6-phosphate dehydrogenase  poor nutrition or malabsorption  porphyria  severe allergies  thyroid disorder  an unusual or allergic reaction to sulfamethoxazole, trimethoprim, sulfa drugs, other medicines, foods, dyes, or preservatives  pregnant or trying to get pregnant  breast-feeding How should I use this medicine? Take this medicine by mouth with a full glass of water. Follow the directions on the prescription label. Take your medicine at regular intervals. Do not take it more often than directed. Do not skip doses or stop your medicine early. Talk to your pediatrician regarding the use of this medicine in children. Special care may be needed. This medicine has been used in children as young as 262 months of age. Overdosage: If you think you have taken too much of this medicine contact a poison control center or emergency room at once. NOTE: This medicine is only for  you. Do not share this medicine with others. What if I miss a dose? If you miss a dose, take it as soon as you can. If it is almost time for your next dose, take only that dose. Do not take double or extra doses. What may interact with this medicine? Do not take this medicine with any of the following medications:  aminobenzoate potassium  dofetilide  metronidazole This medicine may also interact with the following medications:  ACE inhibitors like benazepril, enalapril, lisinopril, and ramipril  birth control pills  cyclosporine  digoxin  diuretics  indomethacin  medicines for diabetes  methenamine  methotrexate  phenytoin  potassium supplements  pyrimethamine  sulfinpyrazone  tricyclic antidepressants  warfarin This list may not describe all possible interactions. Give your health care provider a list of all the medicines, herbs, non-prescription drugs, or dietary supplements you use. Also tell them if you smoke, drink alcohol, or use illegal drugs. Some items may interact with your medicine. What should I watch for while using this medicine? Tell your doctor or health care professional if your symptoms do not improve. Drink several glasses of water a day to reduce the risk of kidney problems. Do not treat diarrhea with over the counter products. Contact your doctor if you have diarrhea that lasts more than 2 days or if it is severe and watery. This medicine can make you more sensitive to the sun. Keep out of the sun. If you cannot avoid being in the sun, wear protective clothing and use a sunscreen. Do not use sun lamps  or tanning beds/booths. What side effects may I notice from receiving this medicine? Side effects that you should report to your doctor or health care professional as soon as possible:  allergic reactions like skin rash or hives, swelling of the face, lips, or tongue  breathing problems  fever or chills, sore throat  irregular heartbeat,  chest pain  joint or muscle pain  pain or difficulty passing urine  red pinpoint spots on skin  redness, blistering, peeling or loosening of the skin, including inside the mouth  unusual bleeding or bruising  unusually weak or tired  yellowing of the eyes or skin Side effects that usually do not require medical attention (report to your doctor or health care professional if they continue or are bothersome):  diarrhea  dizziness  headache  loss of appetite  nausea, vomiting  nervousness This list may not describe all possible side effects. Call your doctor for medical advice about side effects. You may report side effects to FDA at 1-800-FDA-1088. Where should I keep my medicine? Keep out of the reach of children. Store at room temperature between 20 to 25 degrees C (68 to 77 degrees F). Protect from light. Throw away any unused medicine after the expiration date. NOTE: This sheet is a summary. It may not cover all possible information. If you have questions about this medicine, talk to your doctor, pharmacist, or health care provider.  2020 Elsevier/Gold Standard (2012-09-22 14:38:26)

## 2018-11-12 LAB — URINALYSIS W MICROSCOPIC + REFLEX CULTURE
Bilirubin Urine: NEGATIVE
Glucose, UA: NEGATIVE
Hyaline Cast: NONE SEEN /LPF
Ketones, ur: NEGATIVE
Nitrites, Initial: NEGATIVE
Protein, ur: NEGATIVE
Specific Gravity, Urine: 1.005 (ref 1.001–1.03)
Squamous Epithelial / HPF: NONE SEEN /HPF (ref <=5)
pH: 6 (ref 5.0–8.0)

## 2018-11-12 LAB — CULTURE INDICATED

## 2018-11-12 LAB — URINE CULTURE
MICRO NUMBER:: 871044
SPECIMEN QUALITY:: ADEQUATE

## 2018-11-17 ENCOUNTER — Other Ambulatory Visit: Payer: Self-pay | Admitting: Adult Health

## 2018-11-17 DIAGNOSIS — N390 Urinary tract infection, site not specified: Secondary | ICD-10-CM

## 2018-11-17 MED ORDER — CIPROFLOXACIN HCL 500 MG PO TABS: 500.0000 mg | ORAL_TABLET | Freq: Two times a day (BID) | ORAL | 0 refills | Status: AC

## 2018-11-27 LAB — HM DIABETES EYE EXAM

## 2018-11-29 ENCOUNTER — Other Ambulatory Visit: Payer: Self-pay

## 2018-11-29 ENCOUNTER — Ambulatory Visit (INDEPENDENT_AMBULATORY_CARE_PROVIDER_SITE_OTHER): Payer: Medicare Other

## 2018-11-29 DIAGNOSIS — N390 Urinary tract infection, site not specified: Secondary | ICD-10-CM

## 2018-11-29 NOTE — Progress Notes (Signed)
PATIENT reports that she "MAY" still have something going on but not sure. Just feels "funny"

## 2018-12-01 LAB — URINALYSIS W MICROSCOPIC + REFLEX CULTURE
Bacteria, UA: NONE SEEN /HPF
Bilirubin Urine: NEGATIVE
Glucose, UA: NEGATIVE
Hgb urine dipstick: NEGATIVE
Hyaline Cast: NONE SEEN /LPF
Ketones, ur: NEGATIVE
Nitrites, Initial: NEGATIVE
Protein, ur: NEGATIVE
RBC / HPF: NONE SEEN /HPF (ref 0–2)
Specific Gravity, Urine: 1.006 (ref 1.001–1.03)
Squamous Epithelial / LPF: NONE SEEN /HPF (ref <=5)
WBC, UA: NONE SEEN /HPF (ref 0–5)
pH: 6.5 (ref 5.0–8.0)

## 2018-12-01 LAB — URINE CULTURE
MICRO NUMBER:: 941366
Result:: NO GROWTH
SPECIMEN QUALITY:: ADEQUATE

## 2018-12-01 LAB — CULTURE INDICATED

## 2018-12-06 ENCOUNTER — Other Ambulatory Visit: Payer: Self-pay | Admitting: Adult Health

## 2018-12-06 MED ORDER — FLUCONAZOLE 150 MG PO TABS: 150.0000 mg | ORAL_TABLET | Freq: Once | ORAL | 3 refills | Status: AC

## 2018-12-06 NOTE — Progress Notes (Signed)
78 y.o. female reports perineal burning, urgency and sense of vulvar inflammation in the last several days; she was recently treated for UTI with bactrim on 11/09/2018 and confirmed resolution by UA/culture on 11/29/2018. She has tried azo without improvement. She does report history of perineal yeast infections following UTI treatment and wonders if this may be the case though denies vaginal discharge. She is scheduled to see GYN after the weekend but they were unable to see her sooner. After discussion, she would like to try presumptive treatment with diflucan 150 mg once. She may also try OTC hydrocortisone 1% topical sparingly tonight for external inflammation. She agrees to schedule an OV tomorrow if not significantly improved.    Current Outpatient Medications on File Prior to Visit  Medication Sig Dispense Refill  . aspirin 81 MG tablet Take 81 mg by mouth daily.    Marland Kitchen CALCIUM CITRATE PO Take 500 mg by mouth daily.    . Cholecalciferol (VITAMIN D) 2000 UNITS tablet Take 2,000 Units by mouth 2 (two) times daily.    Marland Kitchen DILT-XR 180 MG 24 hr capsule TAKE 1 CAPSULE BY MOUTH DAILY 90 capsule 3  . fexofenadine (ALLEGRA) 180 MG tablet Take 180 mg by mouth daily.    . fluticasone (VERAMYST) 27.5 MCG/SPRAY nasal spray Place 2 sprays into the nose daily as needed for rhinitis. 10 g 8  . IRON PO Take 50 mg by mouth daily.    Marland Kitchen labetalol (NORMODYNE) 100 MG tablet TAKE 1 AND 1/2 TABLETS BY MOUTH TWICE DAILY 270 tablet 3  . Magnesium 400 MG CAPS Take by mouth daily.    . meloxicam (MOBIC) 15 MG tablet Take 1/2 to 1 tablet Daily with Food for Pain & Inflammation 90 tablet 3  . METHOCARBAMOL PO Take by mouth as needed.    . Multiple Vitamins-Minerals (MULTIVITAMIN WITH MINERALS) tablet Take 1 tablet by mouth daily.    . nitroGLYCERIN (NITROSTAT) 0.4 MG SL tablet Place 1 tablet (0.4 mg total) under the tongue every 5 (five) minutes as needed for chest pain. 25 tablet 12  . pantoprazole (PROTONIX) 40 MG tablet  Take 1 tablet 2 x /day for Acid Indigestion & Reflux 180 tablet 3  . sucralfate (CARAFATE) 1 g tablet TAKE 1 TABLET(1 GRAM) BY MOUTH FOUR TIMES DAILY 360 tablet 0  . triamcinolone ointment (KENALOG) 0.1 % Apply 1 application topically 2 (two) times daily. (Patient taking differently: Apply 1 application topically as needed. ) 80 g 1  . vitamin C (ASCORBIC ACID) 500 MG tablet Take 500 mg by mouth daily.    Marland Kitchen VYTORIN 10-20 MG tablet TAKE 1 TABLET BY MOUTH EVERY DAY 90 tablet 3  . Zinc 50 MG TABS Take by mouth daily.     No current facility-administered medications on file prior to visit.      Allergies:  Allergies  Allergen Reactions  . Altace [Ramipril]     cough  . Azithromycin     palpitation  . Celebrex [Celecoxib]     Gi upset   . Hydrochlorothiazide     cramping  . 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 Prilosec [Omeprazole]     constipation   Medical History:  has Essential hypertension; RAYNAUDS SYNDROME; COPD (chronic obstructive pulmonary disease) (H[facility]); Vitamin D deficiency; GERD (gastroesophageal reflux disease); CAD (coronary artery disease); MVP (mitral valve prolapse); Mixed hyperlipidemia; Abnormal blood sugar; Medication management; IBS (irritable bowel syndrome); Encounter for Medicare annual wellness exam; OSA on  CPAP; Osteopenia; BMI 24.0-24.9, adult; and Aortic atherosclerosis (HCC) on their problem list. Surgical History:  She  has a past surgical history that includes Tonsillectomy; Appendectomy; Abdominal hysterectomy; Tubal ligation; Coronary artery bypass graft; Coronary angioplasty (12/11); and Esophagogastroduodenoscopy (07/21/12). Family History:  Herfamily history includes Heart disease in her brother; Hyperlipidemia in her brother. Social History:   reports that she quit smoking about 53 years ago. She has never used smokeless tobacco. She reports current alcohol use of about 3.0 standard drinks of alcohol per week. She  reports that she does not use drugs.

## 2018-12-13 ENCOUNTER — Encounter: Payer: Self-pay | Admitting: Adult Health

## 2018-12-13 DIAGNOSIS — N816 Rectocele: Secondary | ICD-10-CM | POA: Insufficient documentation

## 2018-12-13 DIAGNOSIS — N8110 Cystocele, unspecified: Secondary | ICD-10-CM | POA: Insufficient documentation

## 2018-12-13 LAB — HM DEXA SCAN

## 2018-12-13 LAB — HM MAMMOGRAPHY

## 2018-12-15 LAB — HM DEXA SCAN

## 2018-12-25 ENCOUNTER — Encounter: Payer: Self-pay | Admitting: Internal Medicine

## 2019-01-15 ENCOUNTER — Encounter: Payer: Self-pay | Admitting: Cardiovascular Disease

## 2019-01-15 ENCOUNTER — Ambulatory Visit: Payer: Medicare Other | Admitting: Cardiovascular Disease

## 2019-01-15 ENCOUNTER — Other Ambulatory Visit: Payer: Self-pay

## 2019-01-15 DIAGNOSIS — R0600 Dyspnea, unspecified: Secondary | ICD-10-CM | POA: Diagnosis not present

## 2019-01-15 DIAGNOSIS — G4733 Obstructive sleep apnea (adult) (pediatric): Secondary | ICD-10-CM

## 2019-01-15 DIAGNOSIS — Z01810 Encounter for preprocedural cardiovascular examination: Secondary | ICD-10-CM

## 2019-01-15 DIAGNOSIS — I2510 Atherosclerotic heart disease of native coronary artery without angina pectoris: Secondary | ICD-10-CM | POA: Diagnosis not present

## 2019-01-15 DIAGNOSIS — E785 Hyperlipidemia, unspecified: Secondary | ICD-10-CM

## 2019-01-15 DIAGNOSIS — I7300 Raynaud's syndrome without gangrene: Secondary | ICD-10-CM

## 2019-01-15 DIAGNOSIS — Z951 Presence of aortocoronary bypass graft: Secondary | ICD-10-CM

## 2019-01-15 DIAGNOSIS — R0789 Other chest pain: Secondary | ICD-10-CM | POA: Diagnosis not present

## 2019-01-15 DIAGNOSIS — R0609 Other forms of dyspnea: Secondary | ICD-10-CM

## 2019-01-15 NOTE — Progress Notes (Signed)
Patient ID: Jasmine Reeves, female   DOB: [birth year], 78 y.o.   MRN: [id number]     HPI: Jasmine Reeves is a 78 y.o. female presents for cardiology evaluation prior to undergoing bladder prolapse surgery.  Jasmine Reeves underwent CABG revascularization surgery x4 on 03/02/2000 by Dr. Roxy Manns with a LIMA placed her distal LAD, vein to the second diagonal, sequential vein to 2 marginal vessels arising from the circumflex coronary artery. Her RCA was free of disease and was not bypassed. Additional problems include mitral valve prolapse, Raynaud's disease, history of short bursts of nonsustained tachycardia on treadmill testing with reference to this on beta blocker therapy.,. She did develop edema remotely on amlodipine. She hasa history of documented APCs on Holter imaging which have improved with labetalol.  She underwent a nuclear perfusion study on 09/07/2013.  She was noted to have ST segment changes  on ECG but nuclear imaging showed apical thinning with breast attenuation, without ischemia.  She had normal wall motion.  She denies any awareness of palpitations.  She does have issues with some arthritic symptoms.  She takes diltiazem 180 mg in addition to losartan 25 mg, Normodyne 150 mg twice a day and HCTZ 25 mg.  She is on Vytorin 10/20 for hyperlipidemia.  She tells me she was referred for sleep study which was done in [place] after a home study was abnormal.  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 Epworth sleepiness scale score was increased at 16.  CPAP was recommended but this has not yet been initiated.  The patient was not aware that I also am involved in sleep medicine. I reviewed with her current Medicare guidelines.  Lab work by her primary physician in 2016 revealed fasting glucose of 110.  She had normal renal function.  LFTs were normal.  Lipid studies were excellent with a total  cholesterol 145, triglycerides 57, HDL 62, and LDL 72.  Hemoglobin A1c was minimally increased at 5.8.  TSH was 3.029.  When I  saw her in September 2018, she denied any chest pain.  She had experienced a presyncopal spell on a very hot day and her dose of Cardizem and losartan were reduced.  She denies any recurrent palpitations.   Her losartan and labetalol were reduced.  Presently, she is without chest pain.  She denies shortness of breath.  Her blood pressure has tended to be low.  She stopped taking hydrochlorothiazide and is now taking diltiazem 180 mg which is also helpful for her a ray nods phenomena and has only been taking labetalol 100 mg at bedtime and losartan 12.5 mg  in the morning.  She is on CPAP therapy for his obstructive sleep apnea which was diagnosed after a home sleep study in [place].  [employer] is her DME company.  Repeat laboratory by Dr. Melford Aase had shown a total cholesterol 145, HDL 57, triglycerides were increased at 215.  LDL was 60.  I last saw her in April 2019.  At that time she was remaining stable.  She not had any symptoms secondary to her Raynaud's phenomena treated with diltiazem.  Her blood pressure was low there was a 10 mm drop in blood pressure from supine to standing and I recommended discontinuance of losartan.  Over the past year and a half, she has done fairly well.  However she has had issues with degenerative scoliosis.  Recently she has noticed some episodes of chest comfort  which seem to occur at rest and not with exertion.  Time she was wondering if these occurred after eating certain foods.  She has noticed some mild ankle swelling.  She has begun to notice more shortness of breath with activity more easily but admits that she has not been as active as she had been doing this Covid pandemic.  She is in need for prolapsed bladder surgery which tentatively is scheduled in on March 12, 2019 by Dr. Milta Deiters.  She apparently has never seen a sleep specialist for  her sleep apnea and has not been sleeping as well.  She is in need for new supplies and not been using her CPAP recently and would like to reinstitute treatment.  Her DME company is [employer].  She presents for evaluation.   Past Medical History:  Diagnosis Date   COPD (chronic obstructive pulmonary disease) (HCC)    Fibromyalgia    GERD (gastroesophageal reflux disease)    IBS (irritable bowel syndrome)    Scoliosis     Past Surgical History:  Procedure Laterality Date   ABDOMINAL HYSTERECTOMY     APPENDECTOMY     CORONARY ANGIOPLASTY  12/11   CORONARY ARTERY BYPASS GRAFT     ESOPHAGOGASTRODUODENOSCOPY  07/21/12   polyps, Hiatal hernia Dr. Cristina Gong   TONSILLECTOMY     TUBAL LIGATION      Allergies  Allergen Reactions   Altace [Ramipril]     cough   Azithromycin     palpitation   Celebrex [Celecoxib]     Gi upset    Hydrochlorothiazide     cramping   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   Prilosec [Omeprazole]     constipation    Current Outpatient Medications  Medication Sig Dispense Refill   aspirin 81 MG tablet Take 81 mg by mouth daily.     CALCIUM CITRATE PO Take 500 mg by mouth daily.     Cholecalciferol (VITAMIN D) 2000 UNITS tablet Take 2,000 Units by mouth 2 (two) times daily.     DILT-XR 180 MG 24 hr capsule TAKE 1 CAPSULE BY MOUTH DAILY 90 capsule 3   fexofenadine (ALLEGRA) 180 MG tablet Take 180 mg by mouth daily.     fluticasone (VERAMYST) 27.5 MCG/SPRAY nasal spray Place 2 sprays into the nose daily as needed for rhinitis. 10 g 8   IRON PO Take 50 mg by mouth daily.     labetalol (NORMODYNE) 100 MG tablet TAKE 1 AND 1/2 TABLETS BY MOUTH TWICE DAILY 270 tablet 3   Magnesium 400 MG CAPS Take by mouth daily.     meloxicam (MOBIC) 15 MG tablet Take 1/2 to 1 tablet Daily with Food for Pain & Inflammation 90 tablet 3   METHOCARBAMOL PO Take by mouth as needed.     Multiple Vitamins-Minerals  (MULTIVITAMIN WITH MINERALS) tablet Take 1 tablet by mouth daily.     MYRBETRIQ 50 MG TB24 tablet Take 50 mg by mouth daily.     nitroGLYCERIN (NITROSTAT) 0.4 MG SL tablet Place 1 tablet (0.4 mg total) under the tongue every 5 (five) minutes as needed for chest pain. 25 tablet 12   pantoprazole (PROTONIX) 40 MG tablet Take 1 tablet 2 x /day for Acid Indigestion & Reflux 180 tablet 3   sucralfate (CARAFATE) 1 g tablet TAKE 1 TABLET(1 GRAM) BY MOUTH FOUR TIMES DAILY 360 tablet 0   triamcinolone ointment (KENALOG) 0.1 % Apply 1 application topically 2 (two) times  daily. (Patient taking differently: Apply 1 application topically as needed. ) 80 g 1   vitamin C (ASCORBIC ACID) 500 MG tablet Take 500 mg by mouth daily.     VYTORIN 10-20 MG tablet TAKE 1 TABLET BY MOUTH EVERY DAY 90 tablet 3   Zinc 50 MG TABS Take by mouth daily.     No current facility-administered medications for this visit.     Social History   Socioeconomic History   Marital status: Married    Spouse name: Not on file   Number of children: Not on file   Years of education: Not on file   Highest education level: Not on file  Occupational History   Not on file  Social Needs   Financial resource strain: Not on file   Food insecurity    Worry: Not on file    Inability: Not on file   Transportation needs    Medical: Not on file    Non-medical: Not on file  Tobacco Use   Smoking status: Former Smoker    Quit date: 09/03/1965    Years since quitting: 53.4   Smokeless tobacco: Never Used  Substance and Sexual Activity   Alcohol use: Yes    Alcohol/week: 3.0 standard drinks    Types: 3 Standard drinks or equivalent per week    Comment: rare   Drug use: No   Sexual activity: Not on file  Lifestyle   Physical activity    Days per week: Not on file    Minutes per session: Not on file   Stress: Not on file  Relationships   Social connections    Talks on phone: Not on file    Gets together: Not  on file    Attends religious service: Not on file    Active member of club or organization: Not on file    Attends meetings of clubs or organizations: Not on file    Relationship status: Not on file   Intimate partner violence    Fear of current or ex partner: Not on file    Emotionally abused: Not on file    Physically abused: Not on file    Forced sexual activity: Not on file  Other Topics Concern   Not on file  Social History Narrative   Not on file   Socially she is married has 2 children 3 grandchildren. She does not routinely exercise. There is no tobacco use. She does drink occasional alcohol. She is retired Systems analyst.  Family History  Problem Relation Age of Onset   Heart disease Brother    Hyperlipidemia Brother    ROS General: Negative; No fevers, chills, or night sweats;  HEENT: Negative; No changes in vision or hearing, sinus congestion, difficulty swallowing Pulmonary: Negative; No cough, wheezing, shortness of breath, hemoptysis Cardiovascular: Negative; No chest pain, presyncope, syncope, palpatations GI: Negative; No nausea, vomiting, diarrhea, or abdominal pain GU: Negative; No dysuria, hematuria, or difficulty voiding Musculoskeletal: Positive for a Raynaud's; no myalgias, joint pain, or weakness Hematologic/Oncology: Negative; no easy bruising, bleeding Endocrine: Negative; no heat/cold intolerance; no diabetes Neuro: Negative; no changes in balance, headaches Skin: Negative; No rashes or skin lesions Psychiatric: Negative; No behavioral problems, depression Sleep: Positive for obstructive sleep apnea on CPAP;  in need of new supplies as result has not been using Other comprehensive 14 point system review is negative.  PE BP 134/76    Pulse 61    Temp (!) 96.4 F (35.8 C)  Ht 5' 5"  (1.651 m)    Wt 141 lb (64 kg)    SpO2 96%    BMI 23.46 kg/m    Repeat blood pressure by me was 130/76 Wt Readings from Last 3 Encounters:    01/15/19 141 lb (64 kg)  11/29/18 141 lb 6.4 oz (64.1 kg)  11/09/18 140 lb (63.5 kg)   General: Alert, oriented, no distress.  Skin: normal turgor, no rashes, warm and dry HEENT: Normocephalic, atraumatic. Pupils equal round and reactive to light; sclera anicteric; extraocular muscles intact;  Nose without nasal septal hypertrophy Mouth/Parynx benign; Mallinpatti scale 3 Neck: No JVD, no carotid bruits; normal carotid upstroke Lungs: clear to ausculatation and percussion; no wheezing or rales Chest wall: without tenderness to palpitation Heart: PMI not displaced, RRR, s1 s2 normal, 1/6 systolic murmur, no diastolic murmur, no rubs, gallops, thrills, or heaves Abdomen: soft, nontender; no hepatosplenomehaly, BS+; abdominal aorta nontender and not dilated by palpation. Back: Mild kyphoscoliosis; no CVA tenderness Pulses 2+ Musculoskeletal: full range of motion, normal strength, no joint deformities Extremities: no clubbing cyanosis or edema, Homan's sign negative  Neurologic: grossly nonfocal; Cranial nerves grossly wnl Psychologic: Normal mood and affect   ECG (independently read by me): Sinus rhythm at 61 bpm with mild sinus arrhythmia.  Normal intervals.  No ST segment changes  April 2019  ECG (independently read by me): Sinus bradycardia 58 bpm.  No ectopy.  Normal intervals.  September 2018 ECG (independently read by me): Normal sinus rhythm at 62 bpm.  Nonspecific T-wave abnormality  September 2017 ECG (independently read by me): Sinus bradycardia 56 bpm with mild sinus arrhythmia.  Nonspecific T changes.  September 2016 ECG (independently read by me): Sinus bradycardia with sinus arrhythmia.  No significant ST-T changes.  August 2015 ECG: Sinus rhythm at 51 beats per minute. Non-specific T changes.  LABS: BMP Latest Ref Rng & Units 10/24/2018 05/10/2018 02/07/2018  Glucose 65 - 99 mg/dL 106(H) 88 99  BUN 7 - 25 mg/dL 22 19 21   Creatinine 0.60 - 0.93 mg/dL 0.80 0.73 0.74   BUN/Creat Ratio 6 - 22 (calc) NOT APPLICABLE NOT APPLICABLE NOT APPLICABLE  Sodium 203 - 146 mmol/L 140 140 142  Potassium 3.5 - 5.3 mmol/L 4.2 4.1 3.9  Chloride 98 - 110 mmol/L 105 102 107  CO2 20 - 32 mmol/L 25 28 25   Calcium 8.6 - 10.4 mg/dL 9.6 9.9 9.3   Hepatic Function Latest Ref Rng & Units 10/24/2018 05/10/2018 02/07/2018  Total Protein 6.1 - 8.1 g/dL 6.4 6.7 6.3  Albumin 3.6 - 5.1 g/dL - - -  AST 10 - 35 U/L 21 26 21   ALT 6 - 29 U/L 18 23 22   Alk Phosphatase 33 - 130 U/L - - -  Total Bilirubin 0.2 - 1.2 mg/dL 0.7 0.6 0.5  Bilirubin, Direct 0.0 - 0.2 mg/dL - - -   CBC Latest Ref Rng & Units 10/24/2018 05/10/2018 02/07/2018  WBC 3.8 - 10.8 Thousand/uL 6.5 5.6 4.6  Hemoglobin 11.7 - 15.5 g/dL 12.9 13.0 12.1  Hematocrit 35.0 - 45.0 % 38.8 38.3 36.8  Platelets 140 - 400 Thousand/uL 194 193 235   Lab Results  Component Value Date   MCV 93.5 10/24/2018   MCV 92.3 05/10/2018   MCV 93.4 02/07/2018   Lab Results  Component Value Date   TSH 3.77 10/24/2018   Lab Results  Component Value Date   HGBA1C 5.8 (H) 10/24/2018   Lipid Panel     Component Value Date/Time  CHOL 155 10/24/2018 0000   TRIG 69 10/24/2018 0000   HDL 68 10/24/2018 0000   CHOLHDL 2.3 10/24/2018 0000   VLDL 22 10/04/2016 1114   LDLCALC 72 10/24/2018 0000    RADIOLOGY: No results found.  IMPRESSION:  1. CAD in native artery   2. Hx of CABG   3. Preop cardiovascular exam   4. DOE (dyspnea on exertion)   5. Atypical chest pain   6. Hyperlipidemia LDL goal <70   7. Raynaud's phenomenon without gangrene   8. OSA (obstructive sleep apnea)     ASSESSMENT AND PLAN: [name] is a 78 years old Caucasian female who underwent cardiac catheterization on New Year's Eve 2001.  Catheterization revealed severe two-vessel coronary artery disease and she underwent CABG revascularization surgery on 03/02/2000. Her last catheterization was in 2008 which showed patent grafts. She has normal LV function  and mitral valve prolapse.   Her last  nuclear perfusion study in 2015 was unchanged and continued to show normal perfusion without scar or ischemia.  She did have exercise-induced ST changes.  She has a history of Raynaud's phenomena which has successfully been treated with her diltiazem.  Presently, she has been taking diltiazem 180 mg daily, labetalol 100 mg at bedtime is no longer taking this twice a day.  She has not had recent ectopy.  Recently she has noticed increasing exertional dyspnea as well as some episodes of chest pain which seem to occur at rest and she is uncertain if this potentially was related to certain foods.  She has been taking pantoprazole 40 mg twice a day.  She also has been taking meloxicam for arthritis.  She does have some trace ankle edema today.  She is in need for bladder surgery for a cystocele tentatively scheduled for January 2021.  She is now 12 years status post CABG revascularization with her last nuclear study 5 years ago.  I have suggested she undergo a preoperative Lexiscan Myoview study to make certain there is no significant interval change since her prior study.  I also discussed  new technology with mask for her CPAP.  I have not been following her sleep apnea but she states that she has not really followed with anybody in her study was initially prescribed by Dr. Vicente Serene.  She will be going to [place].  I have suggested she consider trying a ResMed air fit N 30i or Respironics DreamWear nasal mask I think she would be able to do well with with her CPAP.  She is never had any follow-up downloads that she is aware of.  I will defer to her if she wishes me to just with her sleep apnea and if so her machine will need to be linked to our office so that I may be able to review download data.  I will see her 4 weeks for reevaluation and if things are stable she will be given preoperative clearance at that time for her prolapsed bladder surgery.   Time spent: 25  minutes Troy Sine, MD, [facility]  01/17/2019 6:12 PM

## 2019-01-15 NOTE — Patient Instructions (Signed)
Medication Instructions:  The current medical regimen is effective;  continue present plan and medications as directed. Please refer to the Current Medication list given to you today. If you need a refill on your cardiac medications before your next appointment, please call your pharmacy.  Testing/Procedures: Echocardiogram - Your physician has requested that you have an echocardiogram. Echocardiography is a painless test that uses sound waves to create images of your heart. It provides your doctor with information about the size and shape of your heart and how well your heart's chambers and valves are working. This procedure takes approximately one hour. There are no restrictions for this procedure. This will be performed at our [facility] location - [address], [address].  SOMEONE WILL BE CALLING YOU TO SCHEDULE  Your physician has requested that you have a lexiscan myoview. A cardiac stress test is a cardiological test that measures the heart's ability to respond to external stress in a controlled clinical environment. The stress response is induced by intravenous pharmacological stimulation. SOMEONE WILL BE CALLING TO SCHEDULE THIS APPOINTMENT.  Follow-Up: In Person Shelva Majestic, MD ON DEC 16-@ 1120AM.    At [facility], you and your health needs are our priority.  As part of our continuing mission to provide you with exceptional heart care, we have created designated Provider Care Teams.  These Care Teams include your primary Cardiologist (physician) and Advanced Practice Providers (APPs -  Physician Assistants and Nurse Practitioners) who all work together to provide you with the care you need, when you need it.  Thank you for choosing [facility] at [facility]!!

## 2019-01-16 ENCOUNTER — Telehealth (HOSPITAL_COMMUNITY): Payer: Self-pay

## 2019-01-16 ENCOUNTER — Encounter: Payer: Self-pay | Admitting: Cardiovascular Disease

## 2019-01-16 NOTE — Telephone Encounter (Signed)
Encounter complete. 

## 2019-01-17 ENCOUNTER — Encounter: Payer: Self-pay | Admitting: Cardiovascular Disease

## 2019-01-19 ENCOUNTER — Ambulatory Visit (HOSPITAL_COMMUNITY)
Admission: RE | Admit: 2019-01-19 | Discharge: 2019-01-19 | Disposition: A | Discharge status: Home / Self Care | Payer: Medicare Other | Source: Ambulatory Visit | Attending: Cardiology | Admitting: Cardiology

## 2019-01-19 ENCOUNTER — Other Ambulatory Visit: Payer: Self-pay

## 2019-01-19 DIAGNOSIS — Z951 Presence of aortocoronary bypass graft: Secondary | ICD-10-CM | POA: Diagnosis not present

## 2019-01-19 DIAGNOSIS — R0600 Dyspnea, unspecified: Secondary | ICD-10-CM | POA: Diagnosis not present

## 2019-01-19 DIAGNOSIS — Z01810 Encounter for preprocedural cardiovascular examination: Secondary | ICD-10-CM | POA: Insufficient documentation

## 2019-01-19 DIAGNOSIS — R0789 Other chest pain: Secondary | ICD-10-CM

## 2019-01-19 DIAGNOSIS — R0609 Other forms of dyspnea: Secondary | ICD-10-CM

## 2019-01-19 LAB — MYOCARDIAL PERFUSION IMAGING
LV dias vol: 84 mL (ref 46–106)
LV sys vol: 30 mL
Peak HR: 93 {beats}/min
Rest HR: 58 {beats}/min
SDS: 1
SRS: 1
SSS: 2
TID: 0.98

## 2019-01-19 MED ORDER — TECHNETIUM TC 99M TETROFOSMIN IV KIT
32.4000 | PACK | Freq: Once | INTRAVENOUS | Status: AC | PRN
  Administered 2019-01-19: 32.4 via INTRAVENOUS
  Filled 2019-01-19: qty 33

## 2019-01-19 MED ORDER — REGADENOSON 0.4 MG/5ML IV SOLN
0.4000 mg | Freq: Once | INTRAVENOUS | Status: AC
  Administered 2019-01-19: 0.4 mg via INTRAVENOUS

## 2019-01-19 MED ORDER — TECHNETIUM TC 99M TETROFOSMIN IV KIT
10.8000 | PACK | Freq: Once | INTRAVENOUS | Status: AC | PRN
  Administered 2019-01-19: 10.8 via INTRAVENOUS
  Filled 2019-01-19: qty 11

## 2019-01-29 ENCOUNTER — Ambulatory Visit (HOSPITAL_COMMUNITY): Payer: Medicare Other | Attending: Cardiology

## 2019-01-29 ENCOUNTER — Other Ambulatory Visit: Payer: Self-pay

## 2019-01-29 DIAGNOSIS — R0789 Other chest pain: Secondary | ICD-10-CM | POA: Diagnosis present

## 2019-01-29 DIAGNOSIS — Z01810 Encounter for preprocedural cardiovascular examination: Secondary | ICD-10-CM | POA: Insufficient documentation

## 2019-01-29 DIAGNOSIS — R0609 Other forms of dyspnea: Secondary | ICD-10-CM

## 2019-01-29 DIAGNOSIS — R0600 Dyspnea, unspecified: Secondary | ICD-10-CM | POA: Diagnosis present

## 2019-01-29 DIAGNOSIS — Z951 Presence of aortocoronary bypass graft: Secondary | ICD-10-CM | POA: Insufficient documentation

## 2019-01-31 ENCOUNTER — Encounter: Payer: Self-pay | Admitting: Internal Medicine

## 2019-01-31 NOTE — Patient Instructions (Signed)

## 2019-01-31 NOTE — Progress Notes (Signed)
History of Present Illness:      This very nice 78 y.o. MWF presents for 6 month follow up with HTN,ASCAD / CABG,HLD, Pre-Diabetes and Vitamin D Deficiency. Patient has GERD controlled on her meds. Patient is on CPAP for OSA with improved sleep hygiene.       Patient is treated for HTN  (1988) & BP has been controlled at home. Today's BP is at goal - 120/74. In Nov & Dec 2001, she had PCA/ Stents (x2)  and in Jan 2002, she underwent CABG. She had a negative Stress Myoview in 2015 and has done well since. Patient has had no complaints of any cardiac type chest pain, palpitations, dyspnea / orthopnea / PND, dizziness, claudication, or dependent edema.      Hyperlipidemia is controlled with diet & meds. Patient denies myalgias or other med SE's. Last Lipids were at goal:  Lab Results  Component Value Date   CHOL 155 10/24/2018   HDL 68 10/24/2018   LDLCALC 72 10/24/2018   TRIG 69 10/24/2018   CHOLHDL 2.3 10/24/2018        Also, the patient has history of PreDiabetes  (A1c 6.3% / 2012 and 5.8% / 2019)  and has had no symptoms of reactive hypoglycemia, diabetic polys, paresthesias or visual blurring.  Last A1c was not at goal:  Lab Results  Component Value Date   HGBA1C 5.8 (H) 10/24/2018       Further, the patient also has history of Vitamin D Deficiency and supplements vitamin D without any suspected side-effects. Last vitamin D was at goal:  Lab Results  Component Value Date   VD25OH 6375 10/24/2018    Current Outpatient Medications on File Prior to Visit  Medication Sig  . aspirin 81 MG tablet Take 81 mg by mouth daily.  Marland Kitchen. CALCIUM CITRATE PO Take 500 mg by mouth daily.  . Cholecalciferol (VITAMIN D) 2000 UNITS tablet Take 2,000 Units by mouth 2 (two) times daily.  Marland Kitchen. DILT-XR 180 MG 24 hr capsule TAKE 1 CAPSULE BY MOUTH DAILY  . fexofenadine (ALLEGRA) 180 MG tablet Take 180 mg by mouth daily.  . fluticasone (VERAMYST) 27.5 MCG/SPRAY nasal spray Place 2 sprays into the  nose daily as needed for rhinitis.  . IRON PO Take 50 mg by mouth daily.  Marland Kitchen. labetalol (NORMODYNE) 100 MG tablet TAKE 1 AND 1/2 TABLETS BY MOUTH TWICE DAILY  . Magnesium 400 MG CAPS Take by mouth daily.  Marland Kitchen. METHOCARBAMOL PO Take by mouth as needed.  . Multiple Vitamins-Minerals (MULTIVITAMIN WITH MINERALS) tablet Take 1 tablet by mouth daily.  Marland Kitchen. MYRBETRIQ 50 MG TB24 tablet Take 50 mg by mouth daily.  . nitroGLYCERIN (NITROSTAT) 0.4 MG SL tablet Place 1 tablet (0.4 mg total) under the tongue every 5 (five) minutes as needed for chest pain.  . pantoprazole (PROTONIX) 40 MG tablet Take 1 tablet 2 x /day for Acid Indigestion & Reflux  . sucralfate (CARAFATE) 1 g tablet TAKE 1 TABLET(1 GRAM) BY MOUTH FOUR TIMES DAILY  . triamcinolone ointment (KENALOG) 0.1 % Apply 1 application topically 2 (two) times daily. (Patient taking differently: Apply 1 application topically as needed. )  . vitamin C (ASCORBIC ACID) 500 MG tablet Take 500 mg by mouth daily.  Marland Kitchen. VYTORIN 10-20 MG tablet TAKE 1 TABLET BY MOUTH EVERY DAY  . Zinc 50 MG TABS Take by mouth daily.   No current facility-administered medications on file prior to visit.     Allergies  Allergen Reactions  . Altace [Ramipril]     cough  . Azithromycin     palpitation  . Celebrex [Celecoxib]     Gi upset   . Hydrochlorothiazide     cramping  . 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 Prilosec [Omeprazole]     constipation    PMHx:   Past Medical History:  Diagnosis Date  . COPD (chronic obstructive pulmonary disease) (HCC)   . Fibromyalgia   . GERD (gastroesophageal reflux disease)   . IBS (irritable bowel syndrome)   . Scoliosis    Immunization History  Administered Date(s) Administered  . Influenza, High Dose Seasonal PF 11/11/2014, 12/08/2015  . Influenza,inj,Quad PF,6+ Mos 11/27/2016, 11/20/2017, 11/14/2018  . Pneumococcal Conjugate-13 04/16/2014  . Pneumococcal-Unspecified 03/01/1998, 03/12/2010  .  Td 03/27/2012  . Tdap 02/10/2018  . Zoster 03/02/2007   Past Surgical History:  Procedure Laterality Date  . ABDOMINAL HYSTERECTOMY    . APPENDECTOMY    . CORONARY ANGIOPLASTY  12/11  . CORONARY ARTERY BYPASS GRAFT    . ESOPHAGOGASTRODUODENOSCOPY  07/21/12   polyps, Hiatal hernia Dr. Matthias Hughs  . TONSILLECTOMY    . TUBAL LIGATION      FHx:    Reviewed / unchanged  SHx:    Reviewed / unchanged   Systems Review:  Constitutional: Denies fever, chills, wt changes, headaches, insomnia, fatigue, night sweats, change in appetite. Eyes: Denies redness, blurred vision, diplopia, discharge, itchy, watery eyes.  ENT: Denies discharge, congestion, post nasal drip, epistaxis, sore throat, earache, hearing loss, dental pain, tinnitus, vertigo, sinus pain, snoring.  CV: Denies chest pain, palpitations, irregular heartbeat, syncope, dyspnea, diaphoresis, orthopnea, PND, claudication or edema. Respiratory: denies cough, dyspnea, DOE, pleurisy, hoarseness, laryngitis, wheezing.  Gastrointestinal: Denies dysphagia, odynophagia, heartburn, reflux, water brash, abdominal pain or cramps, nausea, vomiting, bloating, diarrhea, constipation, hematemesis, melena, hematochezia  or hemorrhoids. Genitourinary: Denies dysuria, frequency, urgency, nocturia, hesitancy, discharge, hematuria or flank pain. Musculoskeletal: Denies arthralgias, myalgias, stiffness, jt. swelling, pain, limping or strain/sprain.  Skin: Denies pruritus, rash, hives, warts, acne, eczema or change in skin lesion(s). Neuro: No weakness, tremor, incoordination, spasms, paresthesia or pain. Psychiatric: Denies confusion, memory loss or sensory loss. Endo: Denies change in weight, skin or hair change.  Heme/Lymph: No excessive bleeding, bruising or enlarged lymph nodes.  Physical Exam  BP 120/74   Pulse 71   Temp (!) 97.3 F (36.3 C)   Wt 140 lb (63.5 kg)   SpO2 97%   BMI 23.30 kg/m   Appears  well nourished, well groomed  and in no  distress.  Eyes: PERRLA, EOMs, conjunctiva no swelling or erythema. Sinuses: No frontal/maxillary tenderness ENT/Mouth: EAC's clear, TM's nl w/o erythema, bulging. Nares clear w/o erythema, swelling, exudates. Oropharynx clear without erythema or exudates. Oral hygiene is good. Tongue normal, non obstructing. Hearing intact.  Neck: Supple. Thyroid not palpable. Car 2+/2+ without bruits, nodes or JVD. Chest: Respirations nl with BS clear & equal w/o rales, rhonchi, wheezing or stridor.  Cor: Heart sounds normal w/ regular rate and rhythm without sig. murmurs, gallops, clicks or rubs. Peripheral pulses normal and equal  without edema.  Abdomen: Soft & bowel sounds normal. Non-tender w/o guarding, rebound, hernias, masses or organomegaly.  Lymphatics: Unremarkable.  Musculoskeletal: Full ROM all peripheral extremities, joint stability, 5/5 strength and normal gait.  Skin: Warm, dry without exposed rashes, lesions or ecchymosis apparent.  Neuro: Cranial nerves intact, reflexes equal bilaterally. Sensory-motor testing grossly intact. Tendon reflexes grossly  intact.  Pysch: Alert & oriented x 3.  Insight and judgement nl & appropriate. No ideations.  Assessment and Plan:  1. Essential hypertension  - Continue medication, monitor blood pressure at home.  - Continue DASH diet.  Reminder to go to the ER if any CP,  SOB, nausea, dizziness, severe HA, changes vision/speech.  - CBC with Diff - COMPLETE METABOLIC PANEL WITH GFR - Magnesium - TSH  2. Hyperlipidemia, mixed  - Continue diet/meds, exercise,& lifestyle modifications.  - Continue monitor periodic cholesterol/liver & renal functions   - Lipid Profile - TSH  3. Abnormal glucose  - Continue diet, exercise  - Lifestyle modifications.  - Monitor appropriate labs.  - Hemoglobin A1c ([facility]) - Insulin, random  4. Vitamin D deficiency  - Continue supplementation.  - Vitamin D (25 hydroxy)  5. Coronary artery disease  involving native coronary artery  of native heart with other form of angina pectoris ([place])  - Lipid Profile  6. Gastroesophageal reflux disease  - CBC with Diff  7. Medication management  - CBC with Diff - COMPLETE METABOLIC PANEL WITH GFR - Magnesium - Lipid Profile - TSH - Hemoglobin A1c ([facility]) - Insulin, random - Vitamin D (25 hydroxy)        Discussed  regular exercise, BP monitoring, weight control to achieve/maintain BMI less than 25 and discussed med and SE's. Recommended labs to assess and monitor clinical status with further disposition pending results of labs.  I discussed the assessment and treatment plan with the patient. The patient was provided an opportunity to ask questions and all were answered. The patient agreed with the plan and demonstrated an understanding of the instructions.  I provided over 30 minutes of exam, counseling, chart review and  complex critical decision making.  Kirtland Bouchard, MD

## 2019-02-01 ENCOUNTER — Encounter: Payer: Self-pay | Admitting: Internal Medicine

## 2019-02-01 ENCOUNTER — Ambulatory Visit: Payer: Medicare Other | Admitting: Internal Medicine

## 2019-02-01 ENCOUNTER — Other Ambulatory Visit: Payer: Self-pay

## 2019-02-01 VITALS — BP 120/74 | HR 71 | Temp 97.3°F | Wt 140.0 lb

## 2019-02-01 DIAGNOSIS — E782 Mixed hyperlipidemia: Secondary | ICD-10-CM

## 2019-02-01 DIAGNOSIS — R7309 Other abnormal glucose: Secondary | ICD-10-CM | POA: Diagnosis not present

## 2019-02-01 DIAGNOSIS — I10 Essential (primary) hypertension: Secondary | ICD-10-CM | POA: Diagnosis not present

## 2019-02-01 DIAGNOSIS — E559 Vitamin D deficiency, unspecified: Secondary | ICD-10-CM | POA: Diagnosis not present

## 2019-02-01 DIAGNOSIS — Z79899 Other long term (current) drug therapy: Secondary | ICD-10-CM

## 2019-02-01 DIAGNOSIS — I25118 Atherosclerotic heart disease of native coronary artery with other forms of angina pectoris: Secondary | ICD-10-CM

## 2019-02-01 DIAGNOSIS — K219 Gastro-esophageal reflux disease without esophagitis: Secondary | ICD-10-CM

## 2019-02-02 LAB — COMPLETE METABOLIC PANEL WITH GFR
AG Ratio: 2.1 (calc) (ref 1.0–2.5)
ALT: 18 U/L (ref 6–29)
AST: 20 U/L (ref 10–35)
Albumin: 4.2 g/dL (ref 3.6–5.1)
Alkaline phosphatase (APISO): 64 U/L (ref 37–153)
BUN: 22 mg/dL (ref 7–25)
CO2: 25 mmol/L (ref 20–32)
Calcium: 9.2 mg/dL (ref 8.6–10.4)
Chloride: 107 mmol/L (ref 98–110)
Creat: 0.77 mg/dL (ref 0.60–0.93)
GFR, Est African American: 87 mL/min/{1.73_m2} (ref >=60)
GFR, Est Non African American: 75 mL/min/{1.73_m2} (ref >=60)
Globulin: 2 g/dL (calc) (ref 1.9–3.7)
Glucose, Bld: 106 mg/dL — ABNORMAL HIGH (ref 65–99)
Potassium: 3.6 mmol/L (ref 3.5–5.3)
Sodium: 141 mmol/L (ref 135–146)
Total Bilirubin: 0.6 mg/dL (ref 0.2–1.2)
Total Protein: 6.2 g/dL (ref 6.1–8.1)

## 2019-02-02 LAB — HEMOGLOBIN A1C
Hgb A1c MFr Bld: 5.7 % of total Hgb — ABNORMAL HIGH (ref <5.7)
Mean Plasma Glucose: 117 (calc)
eAG (mmol/L): 6.5 (calc)

## 2019-02-02 LAB — CBC WITH DIFFERENTIAL/PLATELET
Absolute Monocytes: 427 cells/uL (ref 200–950)
Basophils Absolute: 49 cells/uL (ref 0–200)
Basophils Relative: 0.9 %
Eosinophils Absolute: 194 cells/uL (ref 15–500)
Eosinophils Relative: 3.6 %
HCT: 38.2 % (ref 35.0–45.0)
Hemoglobin: 12.7 g/dL (ref 11.7–15.5)
Lymphs Abs: 1058 cells/uL (ref 850–3900)
MCH: 30.9 pg (ref 27.0–33.0)
MCHC: 33.2 g/dL (ref 32.0–36.0)
MCV: 92.9 fL (ref 80.0–100.0)
MPV: 9.5 fL (ref 7.5–12.5)
Monocytes Relative: 7.9 %
Neutro Abs: 3672 cells/uL (ref 1500–7800)
Neutrophils Relative %: 68 %
Platelets: 190 10*3/uL (ref 140–400)
RBC: 4.11 10*6/uL (ref 3.80–5.10)
RDW: 11.8 % (ref 11.0–15.0)
Total Lymphocyte: 19.6 %
WBC: 5.4 10*3/uL (ref 3.8–10.8)

## 2019-02-02 LAB — LIPID PANEL
Cholesterol: 152 mg/dL (ref <200)
HDL: 64 mg/dL (ref >=50)
LDL Cholesterol (Calc): 74 mg/dL (calc)
Non-HDL Cholesterol (Calc): 88 mg/dL (calc) (ref <130)
Total CHOL/HDL Ratio: 2.4 (calc) (ref <5.0)
Triglycerides: 59 mg/dL (ref <150)

## 2019-02-02 LAB — TSH: TSH: 4.93 mIU/L — ABNORMAL HIGH (ref 0.40–4.50)

## 2019-02-02 LAB — VITAMIN D 25 HYDROXY (VIT D DEFICIENCY, FRACTURES): Vit D, 25-Hydroxy: 61 ng/mL (ref 30–100)

## 2019-02-02 LAB — MAGNESIUM: Magnesium: 2.2 mg/dL (ref 1.5–2.5)

## 2019-02-02 LAB — INSULIN, RANDOM: Insulin: 11.2 u[IU]/mL

## 2019-02-11 LAB — HM PAP SMEAR

## 2019-02-12 ENCOUNTER — Encounter: Payer: Self-pay | Admitting: Internal Medicine

## 2019-02-14 ENCOUNTER — Encounter: Payer: Self-pay | Admitting: Cardiovascular Disease

## 2019-02-14 ENCOUNTER — Other Ambulatory Visit: Payer: Self-pay

## 2019-02-14 ENCOUNTER — Ambulatory Visit: Payer: Medicare Other | Admitting: Cardiovascular Disease

## 2019-02-14 VITALS — BP 136/71 | HR 76 | Temp 97.1°F | Ht 65.5 in | Wt 146.0 lb

## 2019-02-14 DIAGNOSIS — E785 Hyperlipidemia, unspecified: Secondary | ICD-10-CM

## 2019-02-14 DIAGNOSIS — Z951 Presence of aortocoronary bypass graft: Secondary | ICD-10-CM

## 2019-02-14 DIAGNOSIS — I2510 Atherosclerotic heart disease of native coronary artery without angina pectoris: Secondary | ICD-10-CM

## 2019-02-14 DIAGNOSIS — I10 Essential (primary) hypertension: Secondary | ICD-10-CM

## 2019-02-14 DIAGNOSIS — Z01810 Encounter for preprocedural cardiovascular examination: Secondary | ICD-10-CM | POA: Diagnosis not present

## 2019-02-14 DIAGNOSIS — I350 Nonrheumatic aortic (valve) stenosis: Secondary | ICD-10-CM

## 2019-02-14 MED ORDER — LABETALOL HCL 100 MG PO TABS: 100.0000 mg | ORAL_TABLET | Freq: Every day | ORAL | 3 refills | Status: DC

## 2019-02-14 NOTE — Progress Notes (Signed)
Patient ID: Jasmine Reeves, female   DOB: April 25, 1940, 78 y.o.   MRN: [id number]     HPI: Jasmine Reeves is a 78 y.o. female presents for cardiology evaluation prior to undergoing bladder prolapse surgery.  Jasmine Reeves underwent CABG revascularization surgery x4 on 03/02/2000 by Dr. Roxy Manns with a LIMA placed her distal LAD, vein to the second diagonal, sequential vein to 2 marginal vessels arising from the circumflex coronary artery. Her RCA was free of disease and was not bypassed. Additional problems include mitral valve prolapse, Raynaud's disease, history of short bursts of nonsustained tachycardia on treadmill testing with reference to this on beta blocker therapy.,. She did develop edema remotely on amlodipine. She hasa history of documented APCs on Holter imaging which have improved with labetalol.  She underwent a nuclear perfusion study on 09/07/2013.  She was noted to have ST segment changes  on ECG but nuclear imaging showed apical thinning with breast attenuation, without ischemia.  She had normal wall motion.  She denies any awareness of palpitations.  She does have issues with some arthritic symptoms.  She takes diltiazem 180 mg in addition to losartan 25 mg, Normodyne 150 mg twice a day and HCTZ 25 mg.  She is on Vytorin 10/20 for hyperlipidemia.  She tells me she was referred for sleep study which was done in [place] after a home study was abnormal.  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 Epworth sleepiness scale score was increased at 16.  CPAP was recommended but this has not yet been initiated.  The patient was not aware that I also am involved in sleep medicine. I reviewed with her current Medicare guidelines.  Lab work by her primary physician in 2016 revealed fasting glucose of 110.  She had normal renal function.  LFTs were normal.  Lipid studies were excellent with a total  cholesterol 145, triglycerides 57, HDL 62, and LDL 72.  Hemoglobin A1c was minimally increased at 5.8.  TSH was 3.029.  When I  saw her in September 2018, she denied any chest pain.  She had experienced a presyncopal spell on a very hot day and her dose of Cardizem and losartan were reduced.  She denies any recurrent palpitations.   Her losartan and labetalol were reduced.  Presently, she is without chest pain.  She denies shortness of breath.  Her blood pressure has tended to be low.  She stopped taking hydrochlorothiazide and is now taking diltiazem 180 mg which is also helpful for her a ray nods phenomena and has only been taking labetalol 100 mg at bedtime and losartan 12.5 mg  in the morning.  She is on CPAP therapy for his obstructive sleep apnea which was diagnosed after a home sleep study in [place].  [employer] is her DME company.  Repeat laboratory by Dr. Melford Aase had shown a total cholesterol 145, HDL 57, triglycerides were increased at 215.  LDL was 60.  I last saw her in April 2019.  At that time she was remaining stable.  She not had any symptoms secondary to her Raynaud's phenomena treated with diltiazem.  Her blood pressure was low there was a 10 mm drop in blood pressure from supine to standing and I recommended discontinuance of losartan.  Over the past year and a half, she has done fairly well.  However she has had issues with degenerative scoliosis.  I last saw her on January 15, 2019 at  which time she stated that she had experienced episodes of chest comfort which seem to occur at rest and not with exertion.   She was wondering if these occurred after eating certain foods.  She has noticed some mild ankle swelling.  She has begun to notice more shortness of breath with activity more easily but admits that she has not been as active as she had been doing this Covid pandemic.  She is in need for prolapsed bladder surgery which tentatively is scheduled in on March 12, 2019. She apparently  has never seen a sleep specialist for her sleep apnea and has not been sleeping as well.  She is in need for new supplies and not been using her CPAP recently and would like to reinstitute treatment.  Her DME company is [employer].    Since it had been almost 19 years since her CABG revascularization  I recommended that she undergo a preoperative echo Doppler study and nuclear perfusion study.  The echo Doppler study January 29, 2019 showed normal EF at 60 to 65%.  There was mild aortic stenosis with a mean gradient of 10, and valve area of 1.5 cm.  A nuclear perfusion study on February 05, 2019 was normal without scar or ischemia or ECG changes.  Recent laboratory on December 3 had shown a total cholesterol 152, HDL 64, LDL 74 and triglycerides 59.  She presents for evaluation and preoperative clearance.  Past Medical History:  Diagnosis Date  . COPD (chronic obstructive pulmonary disease) ([place])   . Fibromyalgia   . GERD (gastroesophageal reflux disease)   . IBS (irritable bowel syndrome)   . Scoliosis     Past Surgical History:  Procedure Laterality Date  . ABDOMINAL HYSTERECTOMY    . APPENDECTOMY    . CORONARY ANGIOPLASTY  12/11  . CORONARY ARTERY BYPASS GRAFT    . ESOPHAGOGASTRODUODENOSCOPY  07/21/12   polyps, Hiatal hernia Dr. Cristina Gong  . TONSILLECTOMY    . TUBAL LIGATION      Allergies  Allergen Reactions  . Altace [Ramipril]     cough  . Azithromycin     palpitation  . Celebrex [Celecoxib]     Gi upset   . Hydrochlorothiazide     cramping  . 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 Prilosec [Omeprazole]     constipation    Current Outpatient Medications  Medication Sig Dispense Refill  . aspirin 81 MG tablet Take 81 mg by mouth daily.    Marland Kitchen CALCIUM CITRATE PO Take 500 mg by mouth daily.    . Cholecalciferol (VITAMIN D) 2000 UNITS tablet Take 2,000 Units by mouth 2 (two) times daily.    Marland Kitchen DILT-XR 180 MG 24 hr capsule TAKE 1 CAPSULE BY MOUTH  DAILY 90 capsule 3  . fexofenadine (ALLEGRA) 180 MG tablet Take 180 mg by mouth daily.    . fluticasone (VERAMYST) 27.5 MCG/SPRAY nasal spray Place 2 sprays into the nose daily as needed for rhinitis. 10 g 8  . IRON PO Take 50 mg by mouth daily.    Marland Kitchen labetalol (NORMODYNE) 100 MG tablet Take 1 tablet (100 mg total) by mouth at bedtime. 90 tablet 3  . Magnesium 400 MG CAPS Take by mouth daily.    Marland Kitchen METHOCARBAMOL PO Take by mouth as needed.    . Multiple Vitamins-Minerals (MULTIVITAMIN WITH MINERALS) tablet Take 1 tablet by mouth daily.    Marland Kitchen MYRBETRIQ 50 MG TB24 tablet Take 50 mg  by mouth daily.    . nitroGLYCERIN (NITROSTAT) 0.4 MG SL tablet Place 1 tablet (0.4 mg total) under the tongue every 5 (five) minutes as needed for chest pain. 25 tablet 12  . pantoprazole (PROTONIX) 40 MG tablet Take 1 tablet 2 x /day for Acid Indigestion & Reflux 180 tablet 3  . sucralfate (CARAFATE) 1 g tablet TAKE 1 TABLET(1 GRAM) BY MOUTH FOUR TIMES DAILY 360 tablet 0  . triamcinolone ointment (KENALOG) 0.1 % Apply 1 application topically 2 (two) times daily. (Patient taking differently: Apply 1 application topically as needed. ) 80 g 1  . vitamin C (ASCORBIC ACID) 500 MG tablet Take 500 mg by mouth daily.    Marland Kitchen VYTORIN 10-20 MG tablet TAKE 1 TABLET BY MOUTH EVERY DAY 90 tablet 3  . Zinc 50 MG TABS Take by mouth daily.     No current facility-administered medications for this visit.    Social History   Socioeconomic History  . Marital status: Married    Spouse name: Not on file  . Number of children: Not on file  . Years of education: Not on file  . Highest education level: Not on file  Occupational History  . Not on file  Tobacco Use  . Smoking status: Former Smoker    Quit date: 09/03/1965    Years since quitting: 53.4  . Smokeless tobacco: Never Used  Substance and Sexual Activity  . Alcohol use: Yes    Alcohol/week: 3.0 standard drinks    Types: 3 Standard drinks or equivalent per week    Comment:  rare  . Drug use: No  . Sexual activity: Not on file  Other Topics Concern  . Not on file  Social History Narrative  . Not on file   Social Determinants of Health   Financial Resource Strain:   . Difficulty of Paying Living Expenses: Not on file  Food Insecurity:   . Worried About Charity fundraiser in the Last Year: Not on file  . Ran Out of Food in the Last Year: Not on file  Transportation Needs:   . Lack of Transportation (Medical): Not on file  . Lack of Transportation (Non-Medical): Not on file  Physical Activity:   . Days of Exercise per Week: Not on file  . Minutes of Exercise per Session: Not on file  Stress:   . Feeling of Stress : Not on file  Social Connections:   . Frequency of Communication with Friends and Family: Not on file  . Frequency of Social Gatherings with Friends and Family: Not on file  . Attends Religious Services: Not on file  . Active Member of Clubs or Organizations: Not on file  . Attends Archivist Meetings: Not on file  . Marital Status: Not on file  Intimate Partner Violence:   . Fear of Current or Ex-Partner: Not on file  . Emotionally Abused: Not on file  . Physically Abused: Not on file  . Sexually Abused: Not on file   Socially she is married has 2 children 3 grandchildren. She does not routinely exercise. There is no tobacco use. She does drink occasional alcohol. She is retired Systems analyst.  Family History  Problem Relation Age of Onset  . Heart disease Brother   . Hyperlipidemia Brother    ROS General: Negative; No fevers, chills, or night sweats;  HEENT: Negative; No changes in vision or hearing, sinus congestion, difficulty swallowing Pulmonary: Negative; No cough, wheezing, shortness of breath,  hemoptysis Cardiovascular: Negative; No chest pain, presyncope, syncope, palpatations GI: Negative; No nausea, vomiting, diarrhea, or abdominal pain GU: Negative; No dysuria, hematuria, or difficulty  voiding Musculoskeletal: Positive for a Raynaud's; no myalgias, joint pain, or weakness Hematologic/Oncology: Negative; no easy bruising, bleeding Endocrine: Negative; no heat/cold intolerance; no diabetes Neuro: Negative; no changes in balance, headaches Skin: Negative; No rashes or skin lesions Psychiatric: Negative; No behavioral problems, depression Sleep: Positive for obstructive sleep apnea on CPAP;  in need of new supplies as result has not been using Other comprehensive 14 point system review is negative.  PE BP 136/71   Pulse 76   Temp (!) 97.1 F (36.2 C)   Ht 5' 5.5" (1.664 m)   Wt 146 lb (66.2 kg)   SpO2 99%   BMI 23.93 kg/m    Repeat blood pressure by me was 136/70  Wt Readings from Last 3 Encounters:  02/14/19 146 lb (66.2 kg)  02/01/19 140 lb (63.5 kg)  01/19/19 141 lb (64 kg)   General: Alert, oriented, no distress.  Skin: normal turgor, no rashes, warm and dry HEENT: Normocephalic, atraumatic. Pupils equal round and reactive to light; sclera anicteric; extraocular muscles intact;  Nose without nasal septal hypertrophy Mouth/Parynx benign; Mallinpatti scale 3 Neck: No JVD, no carotid bruits; normal carotid upstroke Lungs: clear to ausculatation and percussion; no wheezing or rales Chest wall: without tenderness to palpitation Heart: PMI not displaced, RRR, s1 s2 normal, 1/6 systolic murmur, no diastolic murmur, no rubs, gallops, thrills, or heaves Abdomen: soft, nontender; no hepatosplenomehaly, BS+; abdominal aorta nontender and not dilated by palpation. Back: Mild kyphoscoliosis; no CVA tenderness Pulses 2+ Musculoskeletal: full range of motion, normal strength, no joint deformities Extremities: no clubbing cyanosis or edema, Homan's sign negative  Neurologic: grossly nonfocal; Cranial nerves grossly wnl Psychologic: Normal mood and affect   ECG (independently read by me): Normal sinus rhythm at 64 bpm.  Isolated PAC, nonspecific ST changes.  Normal  intervals.  November 2020 ECG (independently read by me): Sinus rhythm at 61 bpm with mild sinus arrhythmia.  Normal intervals.  No ST segment changes  April 2019  ECG (independently read by me): Sinus bradycardia 58 bpm.  No ectopy.  Normal intervals.  September 2018 ECG (independently read by me): Normal sinus rhythm at 62 bpm.  Nonspecific T-wave abnormality  September 2017 ECG (independently read by me): Sinus bradycardia 56 bpm with mild sinus arrhythmia.  Nonspecific T changes.  September 2016 ECG (independently read by me): Sinus bradycardia with sinus arrhythmia.  No significant ST-T changes.  August 2015 ECG: Sinus rhythm at 51 beats per minute. Non-specific T changes.  LABS: BMP Latest Ref Rng & Units 02/01/2019 10/24/2018 05/10/2018  Glucose 65 - 99 mg/dL 106(H) 106(H) 88  BUN 7 - 25 mg/dL _0 Creatinine 0.60 - 0.93 mg/dL 0.77 0.80 0.73  BUN/Creat Ratio 6 - 22 (calc) NOT APPLICABLE NOT APPLICABLE NOT APPLICABLE  Sodium 621 - 146 mmol/L 141 140 140  Potassium 3.5 - 5.3 mmol/L 3.6 4.2 4.1  Chloride 98 - 110 mmol/L 107 105 102  CO2 20 - 32 mmol/L _1 Calcium 8.6 - 10.4 mg/dL 9.2 9.6 9.9   Hepatic Function Latest Ref Rng & Units 02/01/2019 10/24/2018 05/10/2018  Total Protein 6.1 - 8.1 g/dL 6.2 6.4 6.7  Albumin 3.6 - 5.1 g/dL - - -  AST 10 - 35 U/L _2 ALT 6 - 29 U/L _3 Alk Phosphatase 33 -  130 U/L - - -  Total Bilirubin 0.2 - 1.2 mg/dL 0.6 0.7 0.6  Bilirubin, Direct 0.0 - 0.2 mg/dL - - -   CBC Latest Ref Rng & Units 02/01/2019 10/24/2018 05/10/2018  WBC 3.8 - 10.8 Thousand/uL 5.4 6.5 5.6  Hemoglobin 11.7 - 15.5 g/dL 12.7 12.9 13.0  Hematocrit 35.0 - 45.0 % 38.2 38.8 38.3  Platelets 140 - 400 Thousand/uL 190 194 193   Lab Results  Component Value Date   MCV 92.9 02/01/2019   MCV 93.5 10/24/2018   MCV 92.3 05/10/2018   Lab Results  Component Value Date   TSH 4.93 (H) 02/01/2019   Lab Results  Component Value Date   HGBA1C 5.7 (H) 02/01/2019    Lipid Panel     Component Value Date/Time   CHOL 152 02/01/2019 0932   TRIG 59 02/01/2019 0932   HDL 64 02/01/2019 0932   CHOLHDL 2.4 02/01/2019 0932   VLDL 22 10/04/2016 1114   LDLCALC 74 02/01/2019 0932    RADIOLOGY: No results found.  IMPRESSION:  1. Essential hypertension   2. Preop cardiovascular exam   3. CAD in native artery   4. Hx of CABG   5. Hyperlipidemia with target LDL less than 70   6. Mild aortic stenosis     ASSESSMENT AND PLAN: Jasmine Reeves is a 78 years old Caucasian female who underwent cardiac catheterization on New Year's Eve 2001.  Catheterization revealed severe two-vessel coronary artery disease and she underwent CABG revascularization surgery on 03/02/2000. Her last catheterization in 2008 showed patent grafts. She has normal LV function and mitral valve prolapse.   A nuclear perfusion study in 2015 was unchanged and continued to show normal perfusion without scar or ischemia.  She did have exercise-induced ST changes.  She has a history of Raynaud's phenomena which has successfully been treated with her diltiazem.  Presently, she has been taking diltiazem 180 mg daily, labetalol 100 mg at bedtime is no longer taking this twice a day.  She has not had recent ectopy.  When I saw her in November 2020 she had recently begun to notice episodes of  exertional dyspnea as well as some episodes of chest pain which seem to occur at rest and she is uncertain if this potentially was related to certain foods.  She has been taking pantoprazole 40 mg twice a day.  She also has been taking meloxicam for arthritis.  She had trace ankle edema.  She is in need for bladder surgery for a cystocele tentatively scheduled for January 2021.  She underwent a preoperative echo Doppler and Lexiscan study..  Her echo Doppler study confirms normal systolic function with evidence for very mild aortic valve stenosis with a mean gradient of 10 and peak gradient of approximately 14 mmHg.   Aortic valve area was 1.59 cm.  Her nuclear perfusion study continues to show normal perfusion.  Her symptoms have improved.  I had a long discussion with her today regarding her mild calcification of aortic valve with mild aortic stenosis.  I discussed the natural history of disease at length.  Presently, she is stable to undergo planned cystocele surgery tentatively scheduled for January 2021.  She continues to use CPAP therapy.  As long as she is stable, I will see her in 6 months for follow-up evaluation  Time spent: 25 minutes Troy Sine, MD, [facility]  02/16/2019 1:40 PM

## 2019-02-14 NOTE — Patient Instructions (Signed)
Medication Instructions:  The current medical regimen is effective;  continue present plan and medications as directed. Please refer to the Current Medication list given to you today. If you need a refill on your cardiac medications before your next appointment, please call your pharmacy.  Follow-Up: IN 6 months Please call our office 2 months in advance, APR 2021 to schedule this JUN 2021 appointment. In Person Shelva Majestic, MD.    Special Instructions: CLEARED FOR UPCOMING SURGERY  Reduce your risk of getting COVID-19 With your heart disease it is especially important for people at increased risk of severe illness from COVID-19, and those who live with them, to protect themselves from getting COVID-19. The best way to protect yourself and to help reduce the spread of the virus that causes COVID-19 is to: Marland Kitchen Limit your interactions with other people as much as possible. . Take precautions to prevent getting COVID-19 when you do interact with others. If you start feeling sick and think you may have COVID-19, get in touch with your healthcare provider within 24  At [facility], you and your health needs are our priority.  As part of our continuing mission to provide you with exceptional heart care, we have created designated Provider Care Teams.  These Care Teams include your primary Cardiologist (physician) and Advanced Practice Providers (APPs -  Physician Assistants and Nurse Practitioners) who all work together to provide you with the care you need, when you need it.  Thank you for choosing [facility] at [facility]!!        Happy Holidays!!

## 2019-02-16 ENCOUNTER — Encounter: Payer: Self-pay | Admitting: Cardiovascular Disease

## 2019-02-16 DIAGNOSIS — M419 Scoliosis, unspecified: Secondary | ICD-10-CM | POA: Insufficient documentation

## 2019-03-07 DIAGNOSIS — N8111 Cystocele, midline: Secondary | ICD-10-CM | POA: Diagnosis not present

## 2019-03-07 DIAGNOSIS — N816 Rectocele: Secondary | ICD-10-CM | POA: Diagnosis not present

## 2019-03-12 DIAGNOSIS — N816 Rectocele: Secondary | ICD-10-CM | POA: Diagnosis not present

## 2019-03-12 DIAGNOSIS — N819 Female genital prolapse, unspecified: Secondary | ICD-10-CM | POA: Diagnosis not present

## 2019-03-12 DIAGNOSIS — N814 Uterovaginal prolapse, unspecified: Secondary | ICD-10-CM | POA: Diagnosis not present

## 2019-03-12 HISTORY — PX: CYSTOCELE REPAIR: SHX163

## 2019-03-15 ENCOUNTER — Ambulatory Visit: Payer: Medicare PPO

## 2019-03-31 ENCOUNTER — Ambulatory Visit: Payer: Medicare PPO

## 2019-04-03 ENCOUNTER — Ambulatory Visit: Payer: Medicare PPO | Attending: Internal Medicine

## 2019-04-03 DIAGNOSIS — Z23 Encounter for immunization: Secondary | ICD-10-CM | POA: Insufficient documentation

## 2019-04-03 NOTE — Progress Notes (Signed)
   Covid-19 Vaccination Clinic  Name:  KAWANA HEGEL    MRN: [id number] DOB: 1941-01-26  04/03/2019  Ms. Hanif was observed post Covid-19 immunization for 15 minutes without incidence. She was provided with Vaccine Information Sheet and instruction to access the V-Safe system.   Ms. Defranco was instructed to call 911 with any severe reactions post vaccine: Marland Kitchen Difficulty breathing  . Swelling of your face and throat  . A fast heartbeat  . A bad rash all over your body  . Dizziness and weakness    Immunizations Administered    Name Date Dose VIS Date Route   Pfizer COVID-19 Vaccine 04/03/2019 12:19 PM 0.3 mL 02/09/2019 Intramuscular   Manufacturer: [employer], [employer]   Lot: [id number]   NDC: [address]-7

## 2019-04-05 ENCOUNTER — Ambulatory Visit: Payer: Medicare PPO

## 2019-04-07 ENCOUNTER — Ambulatory Visit: Payer: Medicare PPO

## 2019-04-18 DIAGNOSIS — N760 Acute vaginitis: Secondary | ICD-10-CM | POA: Diagnosis not present

## 2019-04-18 DIAGNOSIS — Z09 Encounter for follow-up examination after completed treatment for conditions other than malignant neoplasm: Secondary | ICD-10-CM | POA: Diagnosis not present

## 2019-04-26 ENCOUNTER — Ambulatory Visit: Payer: Medicare PPO | Attending: Internal Medicine

## 2019-04-26 DIAGNOSIS — Z23 Encounter for immunization: Secondary | ICD-10-CM

## 2019-04-26 NOTE — Progress Notes (Signed)
   Covid-19 Vaccination Clinic  Name:  Jasmine Reeves    MRN: [id number] DOB: June 08, 1940  04/26/2019  [name] was observed post Covid-19 immunization for 15 minutes without incidence. She was provided with Vaccine Information Sheet and instruction to access the V-Safe system.   [name] was instructed to call 911 with any severe reactions post vaccine: Marland Kitchen Difficulty breathing  . Swelling of your face and throat  . A fast heartbeat  . A bad rash all over your body  . Dizziness and weakness    Immunizations Administered    Name Date Dose VIS Date Route   Pfizer COVID-19 Vaccine 04/26/2019  9:43 AM 0.3 mL 02/09/2019 Intramuscular   Manufacturer: [employer], [employer]   Lot: [id number]   NDC: [address]-7

## 2019-05-05 ENCOUNTER — Ambulatory Visit: Payer: Medicare PPO

## 2019-05-09 DIAGNOSIS — D225 Melanocytic nevi of trunk: Secondary | ICD-10-CM | POA: Diagnosis not present

## 2019-05-09 DIAGNOSIS — L304 Erythema intertrigo: Secondary | ICD-10-CM | POA: Diagnosis not present

## 2019-05-09 DIAGNOSIS — L821 Other seborrheic keratosis: Secondary | ICD-10-CM | POA: Diagnosis not present

## 2019-05-11 NOTE — Progress Notes (Signed)
MEDICARE ANNUAL WELLNESS VISIT AND FOLLOW UP  Assessment:   Diagnoses and all orders for this visit:  Encounter for Medicare annual wellness exam  Atherosclerosis of aorta Per CT 2018 Control blood pressure, cholesterol, glucose, increase exercise.   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  Prescribed nitroglycerine without recent use Followed by cardiology  Essential hypertension At goal; continue medications Monitor blood pressure at home; call if consistently over 130/80 Continue DASH diet.   Reminder to go to the ER if any CP, SOB, nausea, dizziness, severe HA, changes vision/speech, left arm numbness and tingling and jaw pain.  MVP (mitral valve prolapse) Continue BB, monitor.   Raynaud's disease without gangrene No complications, Monitor  Chronic obstructive pulmonary disease, unspecified COPD type (HCC) By imaging; denies notable symptoms of dyspnea, secretions, continue to monitor. Remote hx of smoking.   OSA on CPAP Reports typically wears 100% of the time and endorses restorative sleep  Gastroesophageal reflux disease, esophagitis presence not specified Taking PPI BID, H2i didn't help; will try carafate, but advised if no improvement in 1-2 weeks to schedule follow up appointment with GI Discussed diet, avoiding triggers and other lifestyle changes  Abnormal blood sugar Has been prediabetic; most recent A1C at goal Discussed disease and risks Discussed diet/exercise, weight management   Medication management -     CBC with Differential/Platelet -     CMP/GFR -     Magnesium   Mixed hyperlipidemia At goal; continue medication Continue low cholesterol diet and exercise.  -     Lipid panel -     TSH  Vitamin D deficiency At goal at recent check; continue to recommend supplementation for goal of 70-100 Defer vitamin  D level  Osteopenia of left forearm DEXA 2020, followed by Dr. Jennette Kettle Continue dietary calcium, vitamin D supplement, recommend weight bearing exercises  Cystocele S/p repair by Dr. Jennette Kettle Mild perineal discomfort following surgery; has followed up with Dr. Jennette Kettle without concerns   Over 40 minutes of exam, counseling, chart review and critical decision making was performed Future Appointments  Date Time Provider Department Center  10/30/2019 11:00 AM [name], MD [facility] None     Plan:   During the course of the visit the patient was educated and counseled about appropriate screening and preventive services including:    Pneumococcal vaccine   Prevnar 13  Influenza vaccine  Td vaccine  Screening electrocardiogram  Bone densitometry screening  Colorectal cancer screening  Diabetes screening  Glaucoma screening  Nutrition counseling   Advanced directives: requested   Subjective:  CARI BURGO is a 79 y.o. female who presents for Medicare Annual Wellness Visit and 3 month follow up.    Follows annually with Dr. Jennette Kettle for mammograms and PAPs. Had known cystocele/rectocele, recently underwent surgical repair 03/12/2019, has followed up but reports she has ongoing perineal discomfort, she denies dysuria, urgency, urinary changes but is requesting we check for UTI today.  she has a diagnosis of GERD which is currently managed by protonix 40 mg BID she reports symptoms is not currently well controlled, and denies breakthrough reflux, but endorses burning/lump sensation in chest, no hoarseness but endorses intermittent ongoing dry cough.  She is back on pantoprazole 40 mg daily, has carafate to use PRN, has follow up planned with Dr. Matthias Hughs later this year.   BMI is Body mass index is 23.3 kg/m., she has been working on  diet, no exercise recently as she recovers from cystocele surgery other than walking loops in the home.  Wt Readings from Last 3 Encounters:   05/14/19 142 lb 3.2 oz (64.5 kg)  02/14/19 146 lb (66.2 kg)  02/01/19 140 lb (63.5 kg)   She has hx of PCA x 2 In Nov & Dec 2001, and CABG in Jan 2002. , she underwent CABG. Had reassuring myocardial perfusion and ECHO in 12/2018 other than known MVP and mild aortic stenosis Continues to follow up with Dr. Tresa Endo. She has aortic atherosclerosis and COPD per CXR in 2018/2019  Her blood pressure has been controlled at home, today their BP is BP: 140/80 She does not workout. She denies chest pain, shortness of breath, dizziness.   She is on cholesterol medication (vytorin 10/20) and denies myalgias. Her cholesterol is at goal. The cholesterol last visit was:   Lab Results  Component Value Date   CHOL 152 02/01/2019   HDL 64 02/01/2019   LDLCALC 74 02/01/2019   TRIG 59 02/01/2019   CHOLHDL 2.4 02/01/2019    She has been working on diet and exercise for glucose management, and denies increased appetite, nausea, paresthesia of the feet, polydipsia, polyuria, visual disturbances, vomiting and weight loss. 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 aspirin 81 MG tablet Take 81 mg by mouth daily.    Marland Kitchen CALCIUM CITRATE PO Take 500 mg by mouth daily.    . Cholecalciferol (VITAMIN D) 2000 UNITS tablet Take 2,000 Units by mouth 2 (two) times daily.    Marland Kitchen DILT-XR 180 MG 24 hr capsule TAKE 1 CAPSULE BY MOUTH DAILY 90 capsule 3  . fexofenadine (ALLEGRA) 180 MG tablet Take 180 mg by mouth daily.    . fluticasone (VERAMYST) 27.5 MCG/SPRAY nasal spray Place 2 sprays into the nose daily as needed for rhinitis. 10 g 8   . IRON PO Take 50 mg by mouth daily.    Marland Kitchen labetalol (NORMODYNE) 100 MG tablet Take 1 tablet (100 mg total) by mouth at bedtime. 90 tablet 3  . Magnesium 400 MG CAPS Take by mouth daily.    Marland Kitchen METHOCARBAMOL PO Take by mouth as needed.    . Multiple Vitamins-Minerals (MULTIVITAMIN WITH MINERALS) tablet Take 1 tablet by mouth daily.    . nitroGLYCERIN (NITROSTAT) 0.4 MG SL tablet Place 1 tablet (0.4 mg total) under the tongue every 5 (five) minutes as needed for chest pain. 25 tablet 12  . pantoprazole (PROTONIX) 40 MG tablet Take 1 tablet 2 x /day for Acid Indigestion & Reflux 180 tablet 3  . sucralfate (CARAFATE) 1 g tablet TAKE 1 TABLET(1 GRAM) BY MOUTH FOUR TIMES DAILY 360 tablet 0  . triamcinolone ointment (KENALOG) 0.1 % Apply 1 application topically 2 (two) times daily. (Patient taking differently: Apply 1 application topically as needed. ) 80 g 1  . vitamin C (ASCORBIC ACID) 500 MG tablet Take 500 mg by mouth daily.    Marland Kitchen VYTORIN 10-20 MG tablet TAKE 1 TABLET BY MOUTH EVERY DAY 90 tablet  3  . Zinc 50 MG TABS Take by mouth daily.     No current facility-administered medications on file prior to visit.    Allergies  Allergen Reactions  . Altace [Ramipril]     cough  . Azithromycin     palpitation  . Celebrex [Celecoxib]     Gi upset   . Hydrochlorothiazide     cramping  . 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 Prilosec [Omeprazole]     constipation    Current Problems (verified) Patient Active Problem List   Diagnosis Date Noted  . Cystocele with rectocele 12/13/2018  . Aortic atherosclerosis (HCC) 03/06/2018  . BMI 24.0-24.9, adult 11/01/2017  . Osteopenia 04/12/2017  . OSA on CPAP 11/27/2014  . IBS (irritable bowel syndrome) 11/11/2014  . Encounter for Medicare annual wellness exam 11/11/2014  . Medication management 04/12/2014  . Abnormal blood sugar 10/10/2013  . Mixed hyperlipidemia 10/08/2013  . CAD (coronary artery disease)  02/03/2013  . MVP (mitral valve prolapse) 02/03/2013  . Vitamin D deficiency   . GERD (gastroesophageal reflux disease)   . COPD (chronic obstructive pulmonary disease) (HCC) 02/11/2010  . Essential hypertension 01/28/2010  . RAYNAUDS SYNDROME 01/28/2010    Screening Tests Immunization History  Administered Date(s) Administered  . Influenza, High Dose Seasonal PF 11/11/2014, 12/08/2015  . Influenza,inj,Quad PF,6+ Mos 11/27/2016, 11/20/2017, 11/14/2018  . PFIZER SARS-COV-2 Vaccination 04/03/2019, 04/26/2019  . Pneumococcal Conjugate-13 04/16/2014  . Pneumococcal-Unspecified 03/01/1998, 03/12/2010  . Td 03/27/2012  . Tdap 02/10/2018  . Zoster 03/02/2007   Preventative care: Last colonoscopy: 12/2014 Dr. Matthias Hughs 5 year follow up due 12/2019 Last mammogram: 07/2016 at Dr. Donnetta Hail office, will get this year Last pap smear/pelvic exam: 2020 (Dr. Jennette Kettle)  DEXA: 11/2018 gets at Dr. Donnetta Hail office, forearm osteoporosis, hip osteopenia  CXR 03/2017 Stress test 11/2018 Echo 12/2018 Cath 2008  Prior vaccinations: TD or Tdap: 2019  Influenza: 10/2018 Pneumococcal: 2012 Prevnar 13: 2016 Shingles/Zostavax: 2009 Covid 19 2/2 2021  Names of Other Physician/Practitioners you currently use: 1. [facility] here for primary care 2. Dr. Dione Booze, eye doctor, last visit 2020 3. [facility], dentist, last visit 2021, goes q 6 months 4. Dr. Andi Devon, derm, last 2020, will be going annually  Patient Care Team: [name], MD as PCP - General (Internal Medicine) Lennette Bihari, MD as Consulting Physician (Cardiology) Bernette Redbird, MD as Consulting Physician (Gastroenterology) Freddy Finner, MD as Consulting Physician (Obstetrics and Gynecology)  SURGICAL HISTORY She  has a past surgical history that includes Tonsillectomy; Appendectomy; Abdominal hysterectomy; Tubal ligation; Coronary artery bypass graft; Coronary angioplasty (12/11);  Esophagogastroduodenoscopy (07/21/12); and Cystocele repair (03/12/2019). FAMILY HISTORY Her family history includes Heart disease in her brother; Hyperlipidemia in her brother. SOCIAL HISTORY She  reports that she quit smoking about 53 years ago. She has never used smokeless tobacco. She reports current alcohol use of about 3.0 standard drinks of alcohol per week. She reports that she does not use drugs.   MEDICARE WELLNESS OBJECTIVES: Physical activity: Current Exercise Habits: The patient does not participate in regular exercise at present, Intensity: Mild, Exercise limited by: None identified Cardiac risk factors: Cardiac Risk Factors include: advanced age (>29men, >32 women);dyslipidemia;hypertension;sedentary lifestyle Depression/mood screen:   Depression screen [facility] 2/9 05/14/2019  Decreased Interest 0  Down, Depressed, Hopeless 0  PHQ - 2 Score 0    ADLs:  In your present state of health, do you have any difficulty performing the  following activities: 05/14/2019  Hearing? N  Vision? N  Difficulty concentrating or making decisions? N  Walking or climbing stairs? N  Dressing or bathing? N  Doing errands, shopping? N  Some recent data might be hidden     Cognitive Testing  Alert? Yes  Normal Appearance?Yes  Oriented to person? Yes  Place? Yes   Time? Yes  Recall of three objects?  Yes  Can perform simple calculations? Yes  Displays appropriate judgment?Yes  Can read the correct time from a watch face?Yes  EOL planning: Does Patient Have a Medical Advance Directive?: Yes Type of Advance Directive: Healthcare Power of Attorney, Living will Does patient want to make changes to medical advance directive?: No - Patient declined Copy of [place] in Chart?: No - copy requested  Review of Systems  Constitutional: Negative for chills, fever, malaise/fatigue and weight loss.  HENT: Negative for congestion, hearing loss, sore throat and tinnitus.   Eyes: Negative  for blurred vision and double vision.  Respiratory: Negative for cough, sputum production, shortness of breath and wheezing.   Cardiovascular: Negative for chest pain, palpitations, orthopnea, claudication and leg swelling.  Gastrointestinal: Negative for abdominal pain, blood in stool, constipation, diarrhea, heartburn, melena, nausea and vomiting.  Genitourinary: Negative.        Perineal discomfort following cystocele surgery  Musculoskeletal: Negative for falls, joint pain and myalgias.  Skin: Negative for rash.  Neurological: Negative for dizziness, tingling, sensory change, weakness and headaches.  Endo/Heme/Allergies: Negative for environmental allergies and polydipsia.  Psychiatric/Behavioral: Negative.  Negative for depression, memory loss and substance abuse. The patient is not nervous/anxious and does not have insomnia.   All other systems reviewed and are negative.    Objective:     Today's Vitals   05/14/19 1359  BP: 140/80  Pulse: 80  Temp: (!) 97.5 F (36.4 C)  SpO2: 98%  Weight: 142 lb 3.2 oz (64.5 kg)   Body mass index is 23.3 kg/m.  General appearance: alert, no distress, WD/WN, female HEENT: normocephalic, sclerae anicteric, TMs pearly, nares patent, no discharge or erythema, pharynx normal Oral cavity: MMM, no lesions Neck: supple, no lymphadenopathy, no thyromegaly, no masses Heart: RRR, normal S1, S2, no murmurs Lungs: CTA bilaterally, no wheezes, rhonchi, or rales Abdomen: +bs, soft, non tender, non distended, no masses, no hepatomegaly, no splenomegaly Musculoskeletal: nontender, no swelling, no obvious deformity Extremities: no edema, no cyanosis, no clubbing Pulses: 2+ symmetric, upper and lower extremities, normal cap refill Neurological: alert, oriented x 3, CN2-12 intact, strength normal upper extremities and lower extremities, sensation normal throughout, DTRs 2+ throughout, no cerebellar signs, gait normal Psychiatric: normal affect, behavior  normal, pleasant   Medicare Attestation I have personally reviewed: The patient's medical and social history Their use of alcohol, tobacco or illicit drugs Their current medications and supplements The patient's functional ability including ADLs,fall risks, home safety risks, cognitive, and hearing and visual impairment Diet and physical activities Evidence for depression or mood disorders  The patient's weight, height, BMI, and visual acuity have been recorded in the chart.  I have made referrals, counseling, and provided education to the patient based on review of the above and I have provided the patient with a written personalized care plan for preventive services.     Izora Ribas, NP   05/14/2019

## 2019-05-14 ENCOUNTER — Encounter: Payer: Self-pay | Admitting: Adult Health

## 2019-05-14 ENCOUNTER — Ambulatory Visit: Payer: Medicare Other | Admitting: Adult Health

## 2019-05-14 ENCOUNTER — Other Ambulatory Visit: Payer: Self-pay

## 2019-05-14 VITALS — BP 140/80 | HR 80 | Temp 97.5°F | Wt 142.2 lb

## 2019-05-14 DIAGNOSIS — I25118 Atherosclerotic heart disease of native coronary artery with other forms of angina pectoris: Secondary | ICD-10-CM | POA: Diagnosis not present

## 2019-05-14 DIAGNOSIS — E782 Mixed hyperlipidemia: Secondary | ICD-10-CM

## 2019-05-14 DIAGNOSIS — I7300 Raynaud's syndrome without gangrene: Secondary | ICD-10-CM | POA: Diagnosis not present

## 2019-05-14 DIAGNOSIS — I341 Nonrheumatic mitral (valve) prolapse: Secondary | ICD-10-CM

## 2019-05-14 DIAGNOSIS — Z0000 Encounter for general adult medical examination without abnormal findings: Secondary | ICD-10-CM

## 2019-05-14 DIAGNOSIS — M85832 Other specified disorders of bone density and structure, left forearm: Secondary | ICD-10-CM

## 2019-05-14 DIAGNOSIS — R300 Dysuria: Secondary | ICD-10-CM

## 2019-05-14 DIAGNOSIS — R7309 Other abnormal glucose: Secondary | ICD-10-CM

## 2019-05-14 DIAGNOSIS — E559 Vitamin D deficiency, unspecified: Secondary | ICD-10-CM

## 2019-05-14 DIAGNOSIS — I10 Essential (primary) hypertension: Secondary | ICD-10-CM | POA: Diagnosis not present

## 2019-05-14 DIAGNOSIS — I700 Atherosclerosis of aorta: Secondary | ICD-10-CM | POA: Diagnosis not present

## 2019-05-14 DIAGNOSIS — R6889 Other general symptoms and signs: Secondary | ICD-10-CM | POA: Diagnosis not present

## 2019-05-14 DIAGNOSIS — Z79899 Other long term (current) drug therapy: Secondary | ICD-10-CM | POA: Diagnosis not present

## 2019-05-14 DIAGNOSIS — G4733 Obstructive sleep apnea (adult) (pediatric): Secondary | ICD-10-CM | POA: Diagnosis not present

## 2019-05-14 DIAGNOSIS — Z0001 Encounter for general adult medical examination with abnormal findings: Secondary | ICD-10-CM

## 2019-05-14 DIAGNOSIS — K219 Gastro-esophageal reflux disease without esophagitis: Secondary | ICD-10-CM

## 2019-05-14 DIAGNOSIS — K589 Irritable bowel syndrome without diarrhea: Secondary | ICD-10-CM | POA: Diagnosis not present

## 2019-05-14 DIAGNOSIS — J449 Chronic obstructive pulmonary disease, unspecified: Secondary | ICD-10-CM | POA: Diagnosis not present

## 2019-05-14 DIAGNOSIS — Z6824 Body mass index (BMI) 24.0-24.9, adult: Secondary | ICD-10-CM

## 2019-05-14 DIAGNOSIS — N8110 Cystocele, unspecified: Secondary | ICD-10-CM | POA: Diagnosis not present

## 2019-05-14 DIAGNOSIS — N816 Rectocele: Secondary | ICD-10-CM

## 2019-05-14 DIAGNOSIS — Z9989 Dependence on other enabling machines and devices: Secondary | ICD-10-CM

## 2019-05-14 NOTE — Patient Instructions (Signed)
  Ms. Kucinski , Thank you for taking time to come for your Medicare Wellness Visit. I appreciate your ongoing commitment to your health goals. Please review the following plan we discussed and let me know if I can assist you in the future.   These are the goals we discussed: Goals    . Blood Pressure < 130/80    . Exercise 150 min/wk Moderate Activity       This is a list of the screening recommended for you and due dates:  Health Maintenance  Topic Date Due  . Colon Cancer Screening  01/03/2020  . Tetanus Vaccine  02/11/2028  . Flu Shot  Completed  . DEXA scan (bone density measurement)  Completed  . Pneumonia vaccines  Completed

## 2019-05-15 LAB — CBC WITH DIFFERENTIAL/PLATELET
Absolute Monocytes: 452 cells/uL (ref 200–950)
Basophils Absolute: 52 cells/uL (ref 0–200)
Basophils Relative: 0.9 %
Eosinophils Absolute: 133 cells/uL (ref 15–500)
Eosinophils Relative: 2.3 %
HCT: 40.2 % (ref 35.0–45.0)
Hemoglobin: 13.4 g/dL (ref 11.7–15.5)
Lymphs Abs: 1508 cells/uL (ref 850–3900)
MCH: 30.9 pg (ref 27.0–33.0)
MCHC: 33.3 g/dL (ref 32.0–36.0)
MCV: 92.6 fL (ref 80.0–100.0)
MPV: 9.8 fL (ref 7.5–12.5)
Monocytes Relative: 7.8 %
Neutro Abs: 3654 cells/uL (ref 1500–7800)
Neutrophils Relative %: 63 %
Platelets: 208 10*3/uL (ref 140–400)
RBC: 4.34 10*6/uL (ref 3.80–5.10)
RDW: 11.9 % (ref 11.0–15.0)
Total Lymphocyte: 26 %
WBC: 5.8 10*3/uL (ref 3.8–10.8)

## 2019-05-15 LAB — COMPLETE METABOLIC PANEL WITH GFR
AG Ratio: 2.3 (calc) (ref 1.0–2.5)
ALT: 23 U/L (ref 6–29)
AST: 20 U/L (ref 10–35)
Albumin: 4.5 g/dL (ref 3.6–5.1)
Alkaline phosphatase (APISO): 63 U/L (ref 37–153)
BUN: 16 mg/dL (ref 7–25)
CO2: 26 mmol/L (ref 20–32)
Calcium: 9.4 mg/dL (ref 8.6–10.4)
Chloride: 104 mmol/L (ref 98–110)
Creat: 0.68 mg/dL (ref 0.60–0.93)
GFR, Est African American: 98 mL/min/{1.73_m2} (ref >=60)
GFR, Est Non African American: 85 mL/min/{1.73_m2} (ref >=60)
Globulin: 2 g/dL (calc) (ref 1.9–3.7)
Glucose, Bld: 91 mg/dL (ref 65–99)
Potassium: 3.7 mmol/L (ref 3.5–5.3)
Sodium: 139 mmol/L (ref 135–146)
Total Bilirubin: 0.6 mg/dL (ref 0.2–1.2)
Total Protein: 6.5 g/dL (ref 6.1–8.1)

## 2019-05-15 LAB — URINALYSIS W MICROSCOPIC + REFLEX CULTURE
Bacteria, UA: NONE SEEN /HPF
Bilirubin Urine: NEGATIVE
Glucose, UA: NEGATIVE
Hgb urine dipstick: NEGATIVE
Hyaline Cast: NONE SEEN /LPF
Ketones, ur: NEGATIVE
Leukocyte Esterase: NEGATIVE
Nitrites, Initial: NEGATIVE
Protein, ur: NEGATIVE
RBC / HPF: NONE SEEN /HPF (ref 0–2)
Specific Gravity, Urine: 1.01 (ref 1.001–1.03)
Squamous Epithelial / HPF: NONE SEEN /HPF (ref <=5)
WBC, UA: NONE SEEN /HPF (ref 0–5)
pH: 5.5 (ref 5.0–8.0)

## 2019-05-15 LAB — LIPID PANEL
Cholesterol: 160 mg/dL (ref <200)
HDL: 62 mg/dL (ref >=50)
LDL Cholesterol (Calc): 78 mg/dL (calc)
Non-HDL Cholesterol (Calc): 98 mg/dL (calc) (ref <130)
Total CHOL/HDL Ratio: 2.6 (calc) (ref <5.0)
Triglycerides: 117 mg/dL (ref <150)

## 2019-05-15 LAB — NO CULTURE INDICATED

## 2019-05-15 LAB — TSH: TSH: 2.3 mIU/L (ref 0.40–4.50)

## 2019-05-15 LAB — MAGNESIUM: Magnesium: 2 mg/dL (ref 1.5–2.5)

## 2019-06-29 ENCOUNTER — Other Ambulatory Visit: Payer: Self-pay | Admitting: Internal Medicine

## 2019-06-29 DIAGNOSIS — E782 Mixed hyperlipidemia: Secondary | ICD-10-CM

## 2019-06-29 DIAGNOSIS — I10 Essential (primary) hypertension: Secondary | ICD-10-CM

## 2019-06-29 MED ORDER — EZETIMIBE-SIMVASTATIN 10-20 MG PO TABS: ORAL_TABLET | ORAL | 0 refills | Status: DC

## 2019-06-29 MED ORDER — DILTIAZEM HCL ER 180 MG PO CP24: ORAL_CAPSULE | ORAL | 0 refills | Status: DC

## 2019-07-09 ENCOUNTER — Other Ambulatory Visit: Payer: Self-pay

## 2019-07-09 ENCOUNTER — Encounter: Payer: Self-pay | Admitting: Adult Health

## 2019-07-09 ENCOUNTER — Ambulatory Visit: Payer: Medicare PPO | Admitting: Adult Health

## 2019-07-09 VITALS — BP 118/70 | HR 64 | Temp 97.4°F | Resp 16 | Ht 65.0 in | Wt 146.4 lb

## 2019-07-09 DIAGNOSIS — R350 Frequency of micturition: Secondary | ICD-10-CM

## 2019-07-09 DIAGNOSIS — N949 Unspecified condition associated with female genital organs and menstrual cycle: Secondary | ICD-10-CM

## 2019-07-09 NOTE — Progress Notes (Signed)
Assessment and Plan:  Ramey was seen today for urinary tract infection.  Diagnoses and all orders for this visit:  Perineal discomfort in female Frequency of urination Persistent following cysto/rectocele repair and bladder tack by Dr. Nori Riis in Jan 2021, normal exam on follow up x 2 R/o UTI, yeast per patient preference - UA, culture, Wet prep, tx as indicated Declines repeat pelvic exam today Consider post-op irritation, has myrbetriq at home, suggested 2 week trial of this if labs negative; if controls symptoms may continue this for 2-3 months prior to attempting to taper off again If persistent/recurrent sx, consider referral to urology for further uro testing She is in agreement with above plan of care, will follow up as scheduled or sooner if new/concerning sx -     Urinalysis, Routine w reflex microscopic -     Urine Culture -     WET PREP BY MOLECULAR PROBE  Further disposition pending results of labs. Discussed med's effects and SE's.   Over 30 minutes of exam, counseling, chart review, and critical decision making was performed.   Future Appointments  Date Time Provider [place]  10/30/2019 11:00 AM Unk Pinto, MD [facility] None  05/14/2020  2:00 PM Liane Comber, NP [facility] None    ------------------------------------------------------------------------------------------------------------------   HPI BP 118/70   Pulse 64   Temp (!) 97.4 F (36.3 C)   Resp 16   Ht 5\' 5"  (1.651 m)   Wt 146 lb 6.4 oz (66.4 kg)   SpO2 97%   BMI 24.36 kg/m   79 y.o.female with hx of 2 vaginal deliveries, s/p hysterectomy with recently s/p surgical repair for cystocele/rectocele, "bladder tack" on 03/12/2019 by Dr. Nori Riis, presents for evaluation due to persistent urgency. She does have some dysuria and perineal discomfort intermittently ongoing following surgery. She reports has seen Dr. Nori Riis a few times since surgery without problems on pelvic exam. She is requesting  rule out UTI and vaginal yeast infection as she has hx of this. Denies urinary character change, back/flank pain, vaginal discharge, dribbling, incontinence (does wear liner), fever/chills. Endorses feeling vaguely unwell in the last week or so, unsure if this is related or due to allergies.   Denies hx of STIs, not recently sexually active.    Past Medical History:  Diagnosis Date  . COPD (chronic obstructive pulmonary disease) ([facility])   . Fibromyalgia   . GERD (gastroesophageal reflux disease)   . IBS (irritable bowel syndrome)   . Scoliosis      Allergies  Allergen Reactions  . Altace [Ramipril]     cough  . Azithromycin     palpitation  . Celebrex [Celecoxib]     Gi upset   . Hydrochlorothiazide     cramping  . 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 Prilosec [Omeprazole]     constipation    Current Outpatient Medications on File Prior to Visit  Medication Sig  . aspirin 81 MG tablet Take 81 mg by mouth daily.  Marland Kitchen CALCIUM CITRATE PO Take 500 mg by mouth daily.  . Cholecalciferol (VITAMIN D) 2000 UNITS tablet Take 2,000 Units by mouth 2 (two) times daily.  Marland Kitchen diltiazem (DILT-XR) 180 MG 24 hr capsule Take 1 capsule Daily for BP & Heart Rhythm  . ezetimibe-simvastatin (VYTORIN) 10-20 MG tablet Take 1 tablet Daily for Cholesterol  . fexofenadine (ALLEGRA) 180 MG tablet Take 180 mg by mouth daily.  . fluticasone (VERAMYST) 27.5 MCG/SPRAY nasal spray Place 2 sprays  into the nose daily as needed for rhinitis.  . IRON PO Take 50 mg by mouth daily.  Marland Kitchen labetalol (NORMODYNE) 100 MG tablet Take 1 tablet (100 mg total) by mouth at bedtime.  . Magnesium 400 MG CAPS Take by mouth daily.  Marland Kitchen METHOCARBAMOL PO Take by mouth as needed.  . Multiple Vitamins-Minerals (MULTIVITAMIN WITH MINERALS) tablet Take 1 tablet by mouth daily.  . nitroGLYCERIN (NITROSTAT) 0.4 MG SL tablet Place 1 tablet (0.4 mg total) under the tongue every 5 (five) minutes as needed for chest  pain.  . pantoprazole (PROTONIX) 40 MG tablet Take 1 tablet 2 x /day for Acid Indigestion & Reflux  . sucralfate (CARAFATE) 1 g tablet TAKE 1 TABLET(1 GRAM) BY MOUTH FOUR TIMES DAILY  . triamcinolone ointment (KENALOG) 0.1 % Apply 1 application topically 2 (two) times daily. (Patient taking differently: Apply 1 application topically as needed. )  . vitamin C (ASCORBIC ACID) 500 MG tablet Take 500 mg by mouth daily.  . Zinc 50 MG TABS Take by mouth daily.   No current facility-administered medications on file prior to visit.    ROS: all negative except above.   Physical Exam:  BP 118/70   Pulse 64   Temp (!) 97.4 F (36.3 C)   Resp 16   Ht 5\' 5"  (1.651 m)   Wt 146 lb 6.4 oz (66.4 kg)   SpO2 97%   BMI 24.36 kg/m   General Appearance: Well nourished, in no apparent distress. Eyes: PERRLA, EOMs, conjunctiva no swelling or erythema Sinuses: No Frontal/maxillary tenderness ENT/Mouth: mask in place; Hearing normal.  Neck: Supple, thyroid normal.  Respiratory: Respiratory effort normal, BS equal bilaterally without rales, rhonchi, wheezing or stridor.  Cardio: RRR with no MRGs. Brisk peripheral pulses without edema.  Abdomen: Soft, + BS.  Mild suprapubic tenderness, no guarding, rebound, palpable hernias, masses. Lymphatics: Non tender without lymphadenopathy.  Musculoskeletal: Full ROM, 5/5 strength, normal gait.  Skin: Warm, dry without rashes, lesions, ecchymosis.  Neuro: Normal muscle tone Psych: Awake and oriented X 3, normal affect, Insight and Judgment appropriate.  GU: declines repeat pelvic    , NP 12:15 PM [facility]

## 2019-07-10 LAB — WET PREP BY MOLECULAR PROBE
Candida species: NOT DETECTED
Gardnerella vaginalis: NOT DETECTED
MICRO NUMBER:: 10459872
SPECIMEN QUALITY:: ADEQUATE
Trichomonas vaginosis: NOT DETECTED

## 2019-07-10 LAB — URINALYSIS, ROUTINE W REFLEX MICROSCOPIC
Bilirubin Urine: NEGATIVE
Glucose, UA: NEGATIVE
Hgb urine dipstick: NEGATIVE
Ketones, ur: NEGATIVE
Leukocytes,Ua: NEGATIVE
Nitrite: NEGATIVE
Protein, ur: NEGATIVE
Specific Gravity, Urine: 1.006 (ref 1.001–1.03)
pH: 6.5 (ref 5.0–8.0)

## 2019-07-10 LAB — URINE CULTURE
MICRO NUMBER:: 10459558
Result:: NO GROWTH
SPECIMEN QUALITY:: ADEQUATE

## 2019-07-19 ENCOUNTER — Other Ambulatory Visit: Payer: Self-pay | Admitting: Adult Health

## 2019-07-19 DIAGNOSIS — R350 Frequency of micturition: Secondary | ICD-10-CM

## 2019-07-19 DIAGNOSIS — R102 Pelvic and perineal pain: Secondary | ICD-10-CM

## 2019-07-19 DIAGNOSIS — Z87448 Personal history of other diseases of urinary system: Secondary | ICD-10-CM

## 2019-08-06 ENCOUNTER — Other Ambulatory Visit: Payer: Self-pay | Admitting: Adult Health

## 2019-08-09 ENCOUNTER — Other Ambulatory Visit: Payer: Self-pay | Admitting: Adult Health

## 2019-08-09 DIAGNOSIS — R350 Frequency of micturition: Secondary | ICD-10-CM

## 2019-08-09 DIAGNOSIS — Z87448 Personal history of other diseases of urinary system: Secondary | ICD-10-CM

## 2019-08-09 DIAGNOSIS — R102 Pelvic and perineal pain: Secondary | ICD-10-CM

## 2019-08-14 ENCOUNTER — Encounter: Payer: Self-pay | Admitting: Internal Medicine

## 2019-08-15 ENCOUNTER — Other Ambulatory Visit: Payer: Self-pay | Admitting: Internal Medicine

## 2019-08-15 ENCOUNTER — Other Ambulatory Visit: Payer: Self-pay | Admitting: *Deleted

## 2019-08-15 DIAGNOSIS — M778 Other enthesopathies, not elsewhere classified: Secondary | ICD-10-CM

## 2019-08-15 MED ORDER — PANTOPRAZOLE SODIUM 40 MG PO TBEC: DELAYED_RELEASE_TABLET | ORAL | 3 refills | Status: DC

## 2019-08-15 MED ORDER — MELOXICAM 15 MG PO TABS: ORAL_TABLET | ORAL | 3 refills | Status: DC

## 2019-09-13 NOTE — Progress Notes (Signed)
Assessment and Plan:  Jasmine Reeves was seen today for knee pain and hip pain.  Diagnoses and all orders for this visit:  Iliotibial band syndrome of right side Right knee buckling Exam suggestive of IB band syndrome/tendonitis Does have hx of chronic ankle and knee complaints, suspect at least mild knee arthritis; also could consider lumbar etiology however neg straight leg raise today with intact sensation/strength  She is mainly concerned that this may be related to recent GYN surgery - advised to discuss with specialist at upcoming appointment My exam today is suggestive of IT band tendonitis significantly contributing to her pain Has already been doing NSAIDs, discussed ortho/PT referral vs self guided exercises She would like to try exercises, stretches; reviewed; suggested foam rolling additionally  Prednisone given, hold meloxicam in the interim If not improved will consider ortho/PT referral. Follow up if any worse or persistent in 4-6 weeks despite regular exercises.  -     predniSONE (DELTASONE) 20 MG tablet; 2 tablets daily for 3 days, 1 tablet daily for 4 days.   Further disposition pending results of labs. Discussed med's effects and SE's.   Over 15 minutes of exam, counseling, chart review, and critical decision making was performed.   Future Appointments  Date Time Provider Department Center  10/30/2019 11:00 AM [name], MD [facility] None  05/14/2020  2:00 PM Judd Gaudier, NP [facility] None    ------------------------------------------------------------------------------------------------------------------   HPI BP 118/66   Pulse 75   Temp (!) 96.8 F (36 C)   Wt 147 lb 9.6 oz (67 kg)   SpO2 98%   BMI 24.56 kg/m   79 y.o.female presents for evaluation problems with her R knee giving out in the last month. She reports does have hx of knee problems and has given out occasionally in the past but recently much worse. She reports will have sensation of  "jumping" or spasm in the back and side of her knee and will give out on her. Known hx of scoliosis, has had some back pain earlier in the year following her GYN surgery. Denies shooting/radiating pains, numbness/tingling. Denies knee pain, swelling.   She currently takes meloxicam 15 mg mosst days for chronic aches/pains.   She wonders if this is related to recent concern r/t possible failed bladder tack in Jan 2021.  Hx of 2 vaginal deliveries, s/p hysterectomy with recently s/p surgical repair for cystocele/rectocele, "bladder tack" on 03/12/2019 by Dr. Jennette Kettle. Several months following she reported recurrence of pelvic pressure, perineal discomfort, sense of prolapse, urinary frequency. She reports has seen Dr. Jennette Kettle a few times since surgery without problems on pelvic exam. She had negative UA, wet prep at our office. She had worsening sx and requested referral for second opinion, pending evaluation by Uro/GYN Dr. Raoul Pitch at [facility] later in August. She reports ongoing pelvic/lower abdominal pressure with positional RLQ discomfort, urinary frequency and some perineal discomfort. Denies loss of bladder/bowel control.   Past Medical History:  Diagnosis Date  . COPD (chronic obstructive pulmonary disease) (HCC)   . Fibromyalgia   . GERD (gastroesophageal reflux disease)   . IBS (irritable bowel syndrome)   . Scoliosis      Allergies  Allergen Reactions  . Altace [Ramipril]     cough  . Azithromycin     palpitation  . Celebrex [Celecoxib]     Gi upset   . Hydrochlorothiazide     cramping  . 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  Prilosec [Omeprazole]     constipation    Current Outpatient Medications on File Prior to Visit  Medication Sig  . aspirin 81 MG tablet Take 81 mg by mouth daily.  Marland Kitchen CALCIUM CITRATE PO Take 500 mg by mouth daily.  . Cholecalciferol (VITAMIN D) 2000 UNITS tablet Take 2,000 Units by mouth 2 (two) times daily.  Marland Kitchen diltiazem  (DILT-XR) 180 MG 24 hr capsule Take 1 capsule Daily for BP & Heart Rhythm  . ezetimibe-simvastatin (VYTORIN) 10-20 MG tablet Take 1 tablet Daily for Cholesterol  . fexofenadine (ALLEGRA) 180 MG tablet Take 180 mg by mouth daily.  . fluticasone (VERAMYST) 27.5 MCG/SPRAY nasal spray Place 2 sprays into the nose daily as needed for rhinitis.  . IRON PO Take 50 mg by mouth daily.  Marland Kitchen labetalol (NORMODYNE) 100 MG tablet Take 1 tablet (100 mg total) by mouth at bedtime.  . Magnesium 400 MG CAPS Take by mouth daily.  . meloxicam (MOBIC) 15 MG tablet Take 1/2 to 1 tablet Daily with Food for Pain & Inflammation  . METHOCARBAMOL PO Take by mouth as needed.  . Mirabegron (MYRBETRIQ PO) Take by mouth daily.  . Multiple Vitamins-Minerals (MULTIVITAMIN WITH MINERALS) tablet Take 1 tablet by mouth daily.  . nitroGLYCERIN (NITROSTAT) 0.4 MG SL tablet Place 1 tablet (0.4 mg total) under the tongue every 5 (five) minutes as needed for chest pain.  . pantoprazole (PROTONIX) 40 MG tablet Take 1 tablet 2 x /day for Acid Indigestion & Reflux  . sucralfate (CARAFATE) 1 g tablet TAKE 1 TABLET(1 GRAM) BY MOUTH FOUR TIMES DAILY  . triamcinolone ointment (KENALOG) 0.1 % Apply 1 application topically 2 (two) times daily. (Patient taking differently: Apply 1 application topically as needed. )  . vitamin C (ASCORBIC ACID) 500 MG tablet Take 500 mg by mouth daily.  . Zinc 50 MG TABS Take by mouth daily.   No current facility-administered medications on file prior to visit.    ROS: all negative except above.   Physical Exam:  BP 118/66   Pulse 75   Temp (!) 96.8 F (36 C)   Wt 147 lb 9.6 oz (67 kg)   SpO2 98%   BMI 24.56 kg/m   General Appearance: Well nourished, in no apparent distress. Eyes: PERRLA, conjunctiva no swelling or erythema ENT/Mouth: Mask in place; Hearing normal.  Neck: Supple, thyroid normal.  Respiratory: Respiratory effort normal, BS equal bilaterally without rales, rhonchi, wheezing or  stridor.  Cardio: RRR with no MRGs. Brisk peripheral pulses without edema.  Abdomen: Soft, mildly rounded abdomen without distention, + BS x 4.  Vaguely tender RLQ/R mid to deep palpation, no guarding, rebound, hernias, masses. Lymphatics: Non tender without lymphadenopathy.  Musculoskeletal: lumbar scoliosis limiting ROM but without pain; no SI joint tenderness. Intact ROM hip, knee flexion/extension, some limited patellar mobility, knee without varus or valgus laxity, negative anterior/posterior drawer test. No knee effusion, tenderness over joint line, no popliteal swelling. She is fairly tender along lateral thigh, hip, over greater trochanter. LE strength is symmetrical, 5/5 throughout, normal non-antalgic gait, neg straight leg raise.  Skin: Warm, dry without rashes, lesions, ecchymosis.  Neuro: Cranial nerves intact. Normal muscle tone, no cerebellar symptoms. Sensation intact.  Psych: Awake and oriented X 3, normal affect, Insight and Judgment appropriate.     Dan Maker, NP 10:00 AM [facility]

## 2019-09-14 ENCOUNTER — Ambulatory Visit: Payer: Medicare PPO | Admitting: Adult Health

## 2019-09-14 ENCOUNTER — Encounter: Payer: Self-pay | Admitting: Adult Health

## 2019-09-14 ENCOUNTER — Other Ambulatory Visit: Payer: Self-pay

## 2019-09-14 VITALS — BP 118/66 | HR 75 | Temp 96.8°F | Wt 147.6 lb

## 2019-09-14 DIAGNOSIS — M7631 Iliotibial band syndrome, right leg: Secondary | ICD-10-CM | POA: Diagnosis not present

## 2019-09-14 DIAGNOSIS — M25361 Other instability, right knee: Secondary | ICD-10-CM

## 2019-09-14 MED ORDER — PREDNISONE 20 MG PO TABS
ORAL_TABLET | ORAL | 0 refills | Status: DC
Start: 2019-09-14 — End: 2019-10-29

## 2019-09-14 NOTE — Patient Instructions (Addendum)
Iliotibial Band Syndrome Rehab Ask your health care provider which exercises are safe for you. Do exercises exactly as told by your health care provider and adjust them as directed. It is normal to feel mild stretching, pulling, tightness, or discomfort as you do these exercises. Stop right away if you feel sudden pain or your pain gets significantly worse. Do not begin these exercises until told by your health care provider. Stretching and range-of-motion exercises These exercises warm up your muscles and joints and improve the movement and flexibility of your hip and pelvis. Quadriceps stretch, prone  1. Lie on your abdomen on a firm surface, such as a bed or padded floor (prone position). 2. Bend your left / right knee and reach back to hold your ankle or pant leg. If you cannot reach your ankle or pant leg, loop a belt around your foot and grab the belt instead. 3. Gently pull your heel toward your buttocks. Your knee should not slide out to the side. You should feel a stretch in the front of your thigh and knee (quadriceps). 4. Hold this position for __________ seconds. Repeat __________ times. Complete this exercise __________ times a day. Iliotibial band stretch An iliotibial band is a strong band of muscle tissue that runs from the outer side of your hip to the outer side of your thigh and knee. 1. Lie on your side with your left / right leg in the top position. 2. Bend both of your knees and grab your left / right ankle. Stretch out your bottom arm to help you balance. 3. Slowly bring your top knee back so your thigh goes behind your trunk. 4. Slowly lower your top leg toward the floor until you feel a gentle stretch on the outside of your left / right hip and thigh. If you do not feel a stretch and your knee will not fall farther, place the heel of your other foot on top of your knee and pull your knee down toward the floor with your foot. 5. Hold this position for __________  seconds. Repeat __________ times. Complete this exercise __________ times a day. Strengthening exercises These exercises build strength and endurance in your hip and pelvis. Endurance is the ability to use your muscles for a long time, even after they get tired. Straight leg raises, side-lying This exercise strengthens the muscles that rotate the leg at the hip and move it away from your body (hip abductors). 1. Lie on your side with your left / right leg in the top position. Lie so your head, shoulder, hip, and knee line up. You may bend your bottom knee to help you balance. 2. Roll your hips slightly forward so your hips are stacked directly over each other and your left / right knee is facing forward. 3. Tense the muscles in your outer thigh and lift your top leg 4-6 inches (10-15 cm). 4. Hold this position for __________ seconds. 5. Slowly return to the starting position. Let your muscles relax completely before doing another repetition. Repeat __________ times. Complete this exercise __________ times a day. Leg raises, prone This exercise strengthens the muscles that move the hips (hip extensors). 1. Lie on your abdomen on your bed or a firm surface. You can put a pillow under your hips if that is more comfortable for your lower back. 2. Bend your left / right knee so your foot is straight up in the air. 3. Squeeze your buttocks muscles and lift your left / right thigh   off the bed. Do not let your back arch. 4. Tense your thigh muscle as hard as you can without increasing any knee pain. 5. Hold this position for __________ seconds. 6. Slowly lower your leg to the starting position and allow it to relax completely. Repeat __________ times. Complete this exercise __________ times a day. Hip hike 1. Stand sideways on a bottom step. Stand on your left / right leg with your other foot unsupported next to the step. You can hold on to the railing or wall for balance if needed. 2. Keep your knees  straight and your torso square. Then lift your left / right hip up toward the ceiling. 3. Slowly let your left / right hip lower toward the floor, past the starting position. Your foot should get closer to the floor. Do not lean or bend your knees. Repeat __________ times. Complete this exercise __________ times a day. This information is not intended to replace advice given to you by your health care provider. Make sure you discuss any questions you have with your health care provider. Document Revised: 06/08/2018 Document Reviewed: 12/07/2017 Elsevier Patient Education  2020 [employer]  

## 2019-09-19 ENCOUNTER — Encounter: Payer: Medicare Other | Admitting: Internal Medicine

## 2019-10-03 ENCOUNTER — Other Ambulatory Visit: Payer: Self-pay | Admitting: Internal Medicine

## 2019-10-03 DIAGNOSIS — I10 Essential (primary) hypertension: Secondary | ICD-10-CM

## 2019-10-03 DIAGNOSIS — E782 Mixed hyperlipidemia: Secondary | ICD-10-CM

## 2019-10-04 ENCOUNTER — Other Ambulatory Visit: Payer: Self-pay | Admitting: Internal Medicine

## 2019-10-04 DIAGNOSIS — I10 Essential (primary) hypertension: Secondary | ICD-10-CM

## 2019-10-04 DIAGNOSIS — E782 Mixed hyperlipidemia: Secondary | ICD-10-CM

## 2019-10-10 DIAGNOSIS — R350 Frequency of micturition: Secondary | ICD-10-CM | POA: Diagnosis not present

## 2019-10-10 DIAGNOSIS — N819 Female genital prolapse, unspecified: Secondary | ICD-10-CM | POA: Diagnosis not present

## 2019-10-10 DIAGNOSIS — N958 Other specified menopausal and perimenopausal disorders: Secondary | ICD-10-CM | POA: Diagnosis not present

## 2019-10-19 DIAGNOSIS — R102 Pelvic and perineal pain: Secondary | ICD-10-CM | POA: Diagnosis not present

## 2019-10-19 DIAGNOSIS — N3946 Mixed incontinence: Secondary | ICD-10-CM | POA: Diagnosis not present

## 2019-10-19 DIAGNOSIS — N8110 Cystocele, unspecified: Secondary | ICD-10-CM | POA: Diagnosis not present

## 2019-10-19 DIAGNOSIS — M6289 Other specified disorders of muscle: Secondary | ICD-10-CM | POA: Diagnosis not present

## 2019-10-19 DIAGNOSIS — R198 Other specified symptoms and signs involving the digestive system and abdomen: Secondary | ICD-10-CM | POA: Diagnosis not present

## 2019-10-25 DIAGNOSIS — Z03818 Encounter for observation for suspected exposure to other biological agents ruled out: Secondary | ICD-10-CM | POA: Diagnosis not present

## 2019-10-25 DIAGNOSIS — Z20822 Contact with and (suspected) exposure to covid-19: Secondary | ICD-10-CM | POA: Diagnosis not present

## 2019-10-29 ENCOUNTER — Encounter: Payer: Self-pay | Admitting: Internal Medicine

## 2019-10-29 NOTE — Patient Instructions (Signed)

## 2019-10-29 NOTE — Progress Notes (Signed)
Annual Screening/Preventative Visit & Comprehensive Evaluation &  Examination     This very nice 79 y.o.  MWF  presents for a Screening /Preventative Visit & comprehensive evaluation and management of multiple medical co-morbidities.  Patient has been followed for HTN, ASCAD / CABG, HLD, Prediabetes  and Vitamin D Deficiency.  Patient has GERD controlled on her Protonix.  Patient has OSA on CPAP with improved restorative sleep.       HTN predates circa 1988. Patient's BP has been controlled at home. Patient presented with ACS & had PCA/Stents x 2 in Nov & Dec 2001 and then in Jan 2002 she underwent CABG.  Stress Myoview in 2015 was Negative . Patient denies any cardiac symptoms as chest pain, palpitations, shortness of breath, dizziness or ankle swelling. Today's BP  Is at goal -  124/88.       Patient's hyperlipidemia is controlled with diet and medications. Patient denies myalgias or other medication SE's. Last lipids were at goal:  Lab Results  Component Value Date   CHOL 160 05/14/2019   HDL 62 05/14/2019   LDLCALC 78 05/14/2019   TRIG 117 05/14/2019   CHOLHDL 2.6 05/14/2019        Patient has hx/o prediabetes(A1c 6.3% / 2012& 5.8% / 2019)  and patient denies reactive hypoglycemic symptoms, visual blurring, diabetic polys or paresthesias. Last A1c was near goal:  Lab Results  Component Value Date   HGBA1C 5.7 (H) 02/01/2019        Finally, patient has history of Vitamin D Deficiency and last Vitamin D was at goal:  Lab Results  Component Value Date   VD25OH 61 02/01/2019     Current Outpatient Medications on File Prior to Visit  Medication Sig  . aspirin 81 MG tablet Take 81 mg by mouth daily.  Marland Kitchen CALCIUM CITRATE PO Take 500 mg by mouth daily.  . Cholecalciferol (VITAMIN D) 2000 UNITS tablet Take 2,000 Units by mouth 2 (two) times daily.  Marland Kitchen diltiazem (DILT-XR) 180 MG 24 hr capsule Take 1 capsule Daily for BP & Heart Rhythm  . ezetimibe-simvastatin (VYTORIN) 10-20 MG  tablet TAKE 1 TABLET BY MOUTH DAILY FOR CHOLESTEROL  . fexofenadine (ALLEGRA) 180 MG tablet Take 180 mg by mouth daily.  . fluticasone (VERAMYST) 27.5 MCG/SPRAY nasal spray Place 2 sprays into the nose daily as needed for rhinitis.  . IRON PO Take 50 mg by mouth daily.  Marland Kitchen labetalol (NORMODYNE) 100 MG tablet Take 1 tablet (100 mg total) by mouth at bedtime.  . Magnesium 400 MG CAPS Take by mouth daily.  . meloxicam (MOBIC) 15 MG tablet Take 1/2 to 1 tablet Daily with Food for Pain & Inflammation  . METHOCARBAMOL PO Take by mouth as needed.  . Mirabegron (MYRBETRIQ PO) Take by mouth daily.  . Multiple Vitamins-Minerals (MULTIVITAMIN WITH MINERALS) tablet Take 1 tablet by mouth daily.  . nitroGLYCERIN (NITROSTAT) 0.4 MG SL tablet Place 1 tablet (0.4 mg total) under the tongue every 5 (five) minutes as needed for chest pain.  . pantoprazole (PROTONIX) 40 MG tablet Take 1 tablet 2 x /day for Acid Indigestion & Reflux  . sucralfate (CARAFATE) 1 g tablet TAKE 1 TABLET(1 GRAM) BY MOUTH FOUR TIMES DAILY  . triamcinolone ointment (KENALOG) 0.1 % Apply 1 application topically 2 (two) times daily. (Patient taking differently: Apply 1 application topically as needed. )  . vitamin C (ASCORBIC ACID) 500 MG tablet Take 500 mg by mouth daily.  . Zinc 50 MG TABS Take by  mouth daily.   No current facility-administered medications on file prior to visit.   Allergies  Allergen Reactions  . Altace [Ramipril]     cough  . Azithromycin     palpitation  . Celebrex [Celecoxib]     Gi upset   . Hydrochlorothiazide     cramping  . 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 Prilosec [Omeprazole]     constipation   Past Medical History:  Diagnosis Date  . COPD (chronic obstructive pulmonary disease) (HCC)   . Fibromyalgia   . GERD (gastroesophageal reflux disease)   . IBS (irritable bowel syndrome)   . Scoliosis    Health Maintenance  Topic Date Due  . Hepatitis C Screening   Never done  . INFLUENZA VACCINE  09/30/2019  . COLONOSCOPY  01/03/2020  . TETANUS/TDAP  02/11/2028  . DEXA SCAN  Completed  . COVID-19 Vaccine  Completed  . PNA vac Low Risk Adult  Completed   Immunization History  Administered Date(s) Administered  . Influenza, High Dose Seasonal PF 11/11/2014, 12/08/2015  . Influenza,inj,Quad PF,6+ Mos 11/27/2016, 11/20/2017, 11/14/2018  . PFIZER SARS-COV-2 Vaccination 04/03/2019, 04/26/2019  . Pneumococcal Conjugate-13 04/16/2014  . Pneumococcal-Unspecified 03/01/1998, 03/12/2010  . Td 03/27/2012  . Tdap 02/10/2018  . Zoster 03/02/2007    Last Colon - 07/21/2012 - Dr Matthias Hughs - recc 5 yr f/u   Last MGM - 12/13/2018 Last dexaBMD - 12/15/2018   Past Surgical History:  Procedure Laterality Date  . ABDOMINAL HYSTERECTOMY    . APPENDECTOMY    . CORONARY ANGIOPLASTY  12/11  . CORONARY ARTERY BYPASS GRAFT    . CYSTOCELE REPAIR  03/12/2019   cystocele and rectocele repair, Dr. Jennette Kettle  . ESOPHAGOGASTRODUODENOSCOPY  07/21/12   polyps, Hiatal hernia Dr. Matthias Hughs  . TONSILLECTOMY    . TUBAL LIGATION     Family History  Problem Relation Age of Onset  . Heart disease Brother   . Hyperlipidemia Brother    Social History   Tobacco Use  . Smoking status: Former Smoker    Quit date: 09/03/1965    Years since quitting: 54.1  . Smokeless tobacco: Never Used  Substance Use Topics  . Alcohol use: Yes    Alcohol/week: 3.0 standard drinks    Types: 3 Standard drinks or equivalent per week    Comment: rare  . Drug use: No    ROS Constitutional: Denies fever, chills, weight loss/gain, headaches, insomnia,  night sweats, and change in appetite. Does c/o fatigue. Eyes: Denies redness, blurred vision, diplopia, discharge, itchy, watery eyes.  ENT: Denies discharge, congestion, post nasal drip, epistaxis, sore throat, earache, hearing loss, dental pain, Tinnitus, Vertigo, Sinus pain, snoring.  Cardio: Denies chest pain, palpitations, irregular heartbeat,  syncope, dyspnea, diaphoresis, orthopnea, PND, claudication, edema Respiratory: denies cough, dyspnea, DOE, pleurisy, hoarseness, laryngitis, wheezing.  Gastrointestinal: Denies dysphagia, heartburn, reflux, water brash, pain, cramps, nausea, vomiting, bloating, diarrhea, constipation, hematemesis, melena, hematochezia, jaundice, hemorrhoids Genitourinary: Denies dysuria, frequency, urgency, nocturia, hesitancy, discharge, hematuria, flank pain Breast: Breast lumps, nipple discharge, bleeding.  Musculoskeletal: Denies arthralgia, myalgia, stiffness, Jt. Swelling, pain, limp, and strain/sprain. Denies falls. Skin: Denies puritis, rash, hives, warts, acne, eczema, changing in skin lesion Neuro: No weakness, tremor, incoordination, spasms, paresthesia, pain Psychiatric: Denies confusion, memory loss, sensory loss. Denies Depression. Endocrine: Denies change in weight, skin, hair change, nocturia, and paresthesia, diabetic polys, visual blurring, hyper / hypo glycemic episodes.  Heme/Lymph: No excessive bleeding, bruising, enlarged lymph nodes.  Physical Exam  BP 124/88   Pulse 77   Temp (!) 97.3 F (36.3 C)   Ht 5' 3.5" (1.613 m)   Wt 146 lb (66.2 kg)   SpO2 97%   BMI 25.46 kg/m   General Appearance: Well nourished, well groomed and in no apparent distress.  Eyes: PERRLA, EOMs, conjunctiva no swelling or erythema, normal fundi and vessels. Sinuses: No frontal/maxillary tenderness ENT/Mouth: EACs patent / TMs  nl. Nares clear without erythema, swelling, mucoid exudates. Oral hygiene is good. No erythema, swelling, or exudate. Tongue normal, non-obstructing. Tonsils not swollen or erythematous. Hearing normal.  Neck: Supple, thyroid not palpable. No bruits, nodes or JVD. Respiratory: Respiratory effort normal.  BS equal and clear bilateral without rales, rhonci, wheezing or stridor. Cardio: Heart sounds are normal with regular rate and rhythm and no murmurs, rubs or gallops. Peripheral  pulses are normal and equal bilaterally without edema. No aortic or femoral bruits. Chest: symmetric with normal excursions and percussion. Breasts: Symmetric, without lumps, nipple discharge, retractions, or fibrocystic changes.  Abdomen: Flat, soft with bowel sounds active. Nontender, no guarding, rebound, hernias, masses, or organomegaly.  Lymphatics: Non tender without lymphadenopathy.  Genitourinary:  Musculoskeletal: Full ROM all peripheral extremities, joint stability, 5/5 strength, and normal gait. Skin: Warm and dry without rashes, lesions, cyanosis, clubbing or  ecchymosis.  Neuro: Cranial nerves intact, reflexes equal bilaterally. Normal muscle tone, no cerebellar symptoms. Sensation intact.  Pysch: Alert and oriented X 3, normal affect, Insight and Judgment appropriate.   Assessment and Plan  1. Annual Preventative Screening Examination   2. Essential hypertension  - EKG 12-Lead - Urinalysis, Routine w reflex microscopic - Microalbumin / creatinine urine ratio - CBC with Differential/Platelet - COMPLETE METABOLIC PANEL WITH GFR - Magnesium - TSH  3. Hyperlipidemia, mixed  - EKG 12-Lead - Lipid panel - TSH  4. Abnormal glucose  - EKG 12-Lead - Hemoglobin A1c - Insulin, random  5. Vitamin D deficiency  - VITAMIN D 25 Hydroxy  6. 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 OSA on CPAP   8. Gastroesophageal reflux disease  - CBC with Differential/Platelet  9. Screening for colorectal cancer  - POC Hemoccult Bld/Stl   10. Screening for ischemic heart disease  - EKG 12-Lead  11. FHx: heart disease  - EKG 12-Lead  12. Former smoker  - EKG 12-Lead  13. Aortic atherosclerosis (HCC)  - EKG 12-Lead  14. Medication management  - Urinalysis, Routine w reflex microscopic - Microalbumin / creatinine urine ratio - CBC with  Differential/Platelet - COMPLETE METABOLIC PANEL WITH GFR - Magnesium - Lipid panel - TSH - Hemoglobin A1c - Insulin, random - VITAMIN D 25 Hydroxy         Patient was counseled in prudent diet to achieve/maintain BMI less than 25 for weight control, BP monitoring, regular exercise and medications. Discussed med's effects and SE's. Screening labs and tests as requested with regular follow-up as recommended. Over 40 minutes of exam, counseling, chart review and high complex critical decision making was performed.   Marinus Maw, MD

## 2019-10-30 ENCOUNTER — Encounter: Payer: Medicare Other | Admitting: Internal Medicine

## 2019-10-30 ENCOUNTER — Encounter: Payer: Self-pay | Admitting: Internal Medicine

## 2019-10-30 ENCOUNTER — Other Ambulatory Visit: Payer: Self-pay

## 2019-10-30 ENCOUNTER — Ambulatory Visit: Payer: Medicare Other | Admitting: Internal Medicine

## 2019-10-30 VITALS — BP 124/88 | HR 77 | Temp 97.3°F | Ht 63.5 in | Wt 146.0 lb

## 2019-10-30 DIAGNOSIS — Z9989 Dependence on other enabling machines and devices: Secondary | ICD-10-CM

## 2019-10-30 DIAGNOSIS — I25118 Atherosclerotic heart disease of native coronary artery with other forms of angina pectoris: Secondary | ICD-10-CM

## 2019-10-30 DIAGNOSIS — R7309 Other abnormal glucose: Secondary | ICD-10-CM

## 2019-10-30 DIAGNOSIS — E559 Vitamin D deficiency, unspecified: Secondary | ICD-10-CM | POA: Diagnosis not present

## 2019-10-30 DIAGNOSIS — Z136 Encounter for screening for cardiovascular disorders: Secondary | ICD-10-CM

## 2019-10-30 DIAGNOSIS — Z1211 Encounter for screening for malignant neoplasm of colon: Secondary | ICD-10-CM

## 2019-10-30 DIAGNOSIS — I10 Essential (primary) hypertension: Secondary | ICD-10-CM | POA: Diagnosis not present

## 2019-10-30 DIAGNOSIS — Z0000 Encounter for general adult medical examination without abnormal findings: Secondary | ICD-10-CM

## 2019-10-30 DIAGNOSIS — Z87891 Personal history of nicotine dependence: Secondary | ICD-10-CM

## 2019-10-30 DIAGNOSIS — Z8249 Family history of ischemic heart disease and other diseases of the circulatory system: Secondary | ICD-10-CM | POA: Diagnosis not present

## 2019-10-30 DIAGNOSIS — Z79899 Other long term (current) drug therapy: Secondary | ICD-10-CM | POA: Diagnosis not present

## 2019-10-30 DIAGNOSIS — I700 Atherosclerosis of aorta: Secondary | ICD-10-CM

## 2019-10-30 DIAGNOSIS — K219 Gastro-esophageal reflux disease without esophagitis: Secondary | ICD-10-CM

## 2019-10-30 DIAGNOSIS — Z0001 Encounter for general adult medical examination with abnormal findings: Secondary | ICD-10-CM

## 2019-10-30 DIAGNOSIS — E782 Mixed hyperlipidemia: Secondary | ICD-10-CM | POA: Diagnosis not present

## 2019-10-30 DIAGNOSIS — G4733 Obstructive sleep apnea (adult) (pediatric): Secondary | ICD-10-CM

## 2019-10-31 LAB — URINALYSIS, ROUTINE W REFLEX MICROSCOPIC
Bacteria, UA: NONE SEEN /HPF
Bilirubin Urine: NEGATIVE
Glucose, UA: NEGATIVE
Hgb urine dipstick: NEGATIVE
Hyaline Cast: NONE SEEN /LPF
Ketones, ur: NEGATIVE
Nitrite: NEGATIVE
Protein, ur: NEGATIVE
RBC / HPF: NONE SEEN /HPF (ref 0–2)
Specific Gravity, Urine: 1.009 (ref 1.001–1.03)
WBC, UA: NONE SEEN /HPF (ref 0–5)
pH: 6 (ref 5.0–8.0)

## 2019-10-31 LAB — LIPID PANEL
Cholesterol: 163 mg/dL (ref <200)
HDL: 73 mg/dL (ref >=50)
LDL Cholesterol (Calc): 73 mg/dL (calc)
Non-HDL Cholesterol (Calc): 90 mg/dL (calc) (ref <130)
Total CHOL/HDL Ratio: 2.2 (calc) (ref <5.0)
Triglycerides: 88 mg/dL (ref <150)

## 2019-10-31 LAB — COMPLETE METABOLIC PANEL WITH GFR
AG Ratio: 2.2 (calc) (ref 1.0–2.5)
ALT: 25 U/L (ref 6–29)
AST: 25 U/L (ref 10–35)
Albumin: 4.7 g/dL (ref 3.6–5.1)
Alkaline phosphatase (APISO): 75 U/L (ref 37–153)
BUN: 16 mg/dL (ref 7–25)
CO2: 28 mmol/L (ref 20–32)
Calcium: 9.6 mg/dL (ref 8.6–10.4)
Chloride: 103 mmol/L (ref 98–110)
Creat: 0.81 mg/dL (ref 0.60–0.93)
GFR, Est African American: 81 mL/min/{1.73_m2} (ref >=60)
GFR, Est Non African American: 70 mL/min/{1.73_m2} (ref >=60)
Globulin: 2.1 g/dL (calc) (ref 1.9–3.7)
Glucose, Bld: 108 mg/dL — ABNORMAL HIGH (ref 65–99)
Potassium: 4.1 mmol/L (ref 3.5–5.3)
Sodium: 141 mmol/L (ref 135–146)
Total Bilirubin: 0.6 mg/dL (ref 0.2–1.2)
Total Protein: 6.8 g/dL (ref 6.1–8.1)

## 2019-10-31 LAB — CBC WITH DIFFERENTIAL/PLATELET
Absolute Monocytes: 454 cells/uL (ref 200–950)
Basophils Absolute: 51 cells/uL (ref 0–200)
Basophils Relative: 1 %
Eosinophils Absolute: 143 cells/uL (ref 15–500)
Eosinophils Relative: 2.8 %
HCT: 40 % (ref 35.0–45.0)
Hemoglobin: 13.3 g/dL (ref 11.7–15.5)
Lymphs Abs: 984 cells/uL (ref 850–3900)
MCH: 31.1 pg (ref 27.0–33.0)
MCHC: 33.3 g/dL (ref 32.0–36.0)
MCV: 93.5 fL (ref 80.0–100.0)
MPV: 9.8 fL (ref 7.5–12.5)
Monocytes Relative: 8.9 %
Neutro Abs: 3468 cells/uL (ref 1500–7800)
Neutrophils Relative %: 68 %
Platelets: 200 10*3/uL (ref 140–400)
RBC: 4.28 10*6/uL (ref 3.80–5.10)
RDW: 11.8 % (ref 11.0–15.0)
Total Lymphocyte: 19.3 %
WBC: 5.1 10*3/uL (ref 3.8–10.8)

## 2019-10-31 LAB — INSULIN, RANDOM: Insulin: 14.1 u[IU]/mL

## 2019-10-31 LAB — HEMOGLOBIN A1C
Hgb A1c MFr Bld: 5.8 % of total Hgb — ABNORMAL HIGH (ref <5.7)
Mean Plasma Glucose: 120 (calc)
eAG (mmol/L): 6.6 (calc)

## 2019-10-31 LAB — VITAMIN D 25 HYDROXY (VIT D DEFICIENCY, FRACTURES): Vit D, 25-Hydroxy: 65 ng/mL (ref 30–100)

## 2019-10-31 LAB — MICROALBUMIN / CREATININE URINE RATIO
Creatinine, Urine: 52 mg/dL (ref 20–275)
Microalb Creat Ratio: 6 mcg/mg creat (ref <30)
Microalb, Ur: 0.3 mg/dL

## 2019-10-31 LAB — MAGNESIUM: Magnesium: 2.5 mg/dL (ref 1.5–2.5)

## 2019-10-31 LAB — TSH: TSH: 4.08 mIU/L (ref 0.40–4.50)

## 2019-10-31 NOTE — Progress Notes (Signed)
========================================================== -   Test results slightly outside the reference range are not unusual. If there is anything important, I will review this with you,  otherwise it is considered normal test values.  If you have further questions,  please do not hesitate to contact me at the office or via My Chart.  ==========================================================  -  Total Chol = 163 and LDL = 73 - Both  Excellent   - Very low risk for Heart Attack  / Stroke =============================================================  - A1c = 5.8% - Borderline elevated  sugar, So.................  - Avoid Sweets, Candy & White Stuff   - Rice, Potatoes, Breads &  Pasta ==========================================================  -  Vitamin D= 65 - Excellent  ==========================================================  -  All Else - CBC - Kidneys - Electrolytes - Liver - Magnesium & Thyroid    - all  Normal / OK ==========================================================

## 2019-11-14 ENCOUNTER — Encounter: Payer: Self-pay | Admitting: Internal Medicine

## 2019-11-28 DIAGNOSIS — H10413 Chronic giant papillary conjunctivitis, bilateral: Secondary | ICD-10-CM | POA: Diagnosis not present

## 2019-11-28 DIAGNOSIS — H04123 Dry eye syndrome of bilateral lacrimal glands: Secondary | ICD-10-CM | POA: Diagnosis not present

## 2019-11-28 DIAGNOSIS — H2513 Age-related nuclear cataract, bilateral: Secondary | ICD-10-CM | POA: Diagnosis not present

## 2019-11-28 DIAGNOSIS — R7309 Other abnormal glucose: Secondary | ICD-10-CM | POA: Diagnosis not present

## 2019-11-28 LAB — HM DIABETES EYE EXAM

## 2019-12-04 ENCOUNTER — Encounter: Payer: Self-pay | Admitting: Internal Medicine

## 2019-12-17 DIAGNOSIS — Z1231 Encounter for screening mammogram for malignant neoplasm of breast: Secondary | ICD-10-CM | POA: Diagnosis not present

## 2019-12-20 ENCOUNTER — Other Ambulatory Visit: Payer: Self-pay | Admitting: Adult Health

## 2019-12-26 ENCOUNTER — Ambulatory Visit: Payer: Medicare PPO | Attending: Obstetrics and Gynecology | Admitting: Physical Therapy

## 2019-12-26 ENCOUNTER — Encounter: Payer: Self-pay | Admitting: Physical Therapy

## 2019-12-26 ENCOUNTER — Other Ambulatory Visit: Payer: Self-pay

## 2019-12-26 DIAGNOSIS — N8110 Cystocele, unspecified: Secondary | ICD-10-CM | POA: Diagnosis not present

## 2019-12-26 DIAGNOSIS — M6281 Muscle weakness (generalized): Secondary | ICD-10-CM | POA: Diagnosis not present

## 2019-12-26 DIAGNOSIS — N3946 Mixed incontinence: Secondary | ICD-10-CM | POA: Diagnosis not present

## 2019-12-26 DIAGNOSIS — R278 Other lack of coordination: Secondary | ICD-10-CM | POA: Insufficient documentation

## 2019-12-26 NOTE — Therapy (Signed)
[facility] [address], [address] [place], [place], [address] Phone: [phone]   Fax:  [phone]  Physical Therapy Evaluation  Patient Details  Name: Jasmine FilesWilma M Dashner MRN: [id number] Date of Birth: [birth year] Referring Provider (PT): Dr. Dr. Sherian Maroonhristine Chu   Encounter Date: 12/26/2019   PT End of Session - 12/26/19 0919    Visit Number 1    Date for PT Re-Evaluation 03/19/20    Authorization Type [employer]    PT Start Time 0845    PT Stop Time 0930    PT Time Calculation (min) 45 min    Activity Tolerance Patient tolerated treatment well;No increased pain    Behavior During Therapy WFL for tasks assessed/performed           Past Medical History:  Diagnosis Date  . COPD (chronic obstructive pulmonary disease) (HCC)   . Fibromyalgia   . GERD (gastroesophageal reflux disease)   . IBS (irritable bowel syndrome)   . Scoliosis     Past Surgical History:  Procedure Laterality Date  . ABDOMINAL HYSTERECTOMY    . APPENDECTOMY    . CORONARY ANGIOPLASTY  12/11  . CORONARY ARTERY BYPASS GRAFT    . CYSTOCELE REPAIR  03/12/2019   cystocele and rectocele repair, Dr. Jennette KettleNeal  . ESOPHAGOGASTRODUODENOSCOPY  07/21/12   polyps, Hiatal hernia Dr. Matthias HughsBuccini  . TONSILLECTOMY    . TUBAL LIGATION      There were no vitals filed for this visit.    Subjective Assessment - 12/26/19 0848    Subjective Patient reports 1 year ago saw a prolapse. She had a rectocele and cystocele. Patient had repair of the prolapse on 03/12/2019. She started to have prolapse symptoms in May. Patient saw a different doctor. Patient saw a urogynecologist and found another prolapse. Patient is able to empty her bladder. Sometimes has difficulty with starting her stream of urine. Patient has difficulty with the Kegel contraction. Patient has trouble controlling her gas.    Pertinent History A/P coloporrhaphy, sacrospinaous ligament fixation 03/12/2019; Total  abdominal hysterectomy; has hearing loss and getting a hearing aide; Degenrative scoliosis    Patient Stated Goals improve pelvic floor strength, redue urinary leakage and gas    Currently in Pain? 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 Dr. Sherian Maroonhristine Chu    Onset Date/Surgical Date 06/30/19    Prior Therapy no pelvic floor PT      Precautions   Precautions None      Restrictions   Weight Bearing Restrictions No      Balance Screen   Has the patient fallen in the past 6 months No    Has the patient had a decrease in activity level  because of a fear of falling?  No    Is the patient reluctant to leave their home because of a fear of falling?  No      Prior Function   Level of Independence Independent    Vocation Retired      Copy Status Within Functional Limits for tasks assessed      ROM / Strength   AROM / PROM / Strength AROM;PROM;Strength      Strength   Overall Strength Comments when tighten the abdomen the lower abdomen with bulge    Right Hip Flexion 4/5    Right Hip Extension 4-/5    Right Hip Internal Rotation 4/5    Right Hip ABduction 4-/5    Left Hip Flexion 4/5    Left Hip Extension 4-/5    Left Hip ABduction 4-/5    Left Hip ADduction 4/5      Palpation   Spinal mobility decreased movement of lower rib cage    Palpation comment  tenderness located suprapubically, right diaphram                      Objective measurements completed on examination: See above findings.     Pelvic Floor Special Questions - 12/26/19 0001    Prior Pregnancies Yes    Number of Vaginal Deliveries 2    Currently Sexually Active No    Urinary Leakage Yes    Activities that cause leaking Other    Other activities that cause leaking just leaks    Urinary urgency Yes   sometimes will leak as she is pulling her pants down   Fecal incontinence --   sometimes has constipation   Caffeine beverages drinks tea mostly    Falling out feeling (prolapse) Yes    Skin Integrity Intact   dry   Prolapse Anterior Wall   almost to introitus   Pelvic Floor Internal Exam Patient confirms identification and approves PT to assess pelvic floor and treatment    Exam Type Vaginal    Sensation tenderness located along the sides of the urethra spincter    Palpation tenderness located int he levator ani, obturator internist, bulbocavernosus; when contracting she will add the hip adductors and gluteals in the contraction    Strength Flicker    Strength # of seconds 1    Tone increased                      PT Short Term Goals - 12/26/19 1031      PT SHORT TERM GOAL #1   Title Ind with inital HEP    Time 4    Period Weeks    Status New    Target Date 01/23/20      PT SHORT TERM GOAL #2   Title understand what bladder irritants are and how they affect the urinary leakage    Time 4    Period Weeks    Status New    Target Date 01/23/20      PT SHORT TERM GOAL #3   Title able to contract the lower abdominals correctly to not put pressure on the pelvic floor    Time 4    Period Weeks    Status New    Target Date 01/23/20      PT SHORT TERM GOAL #4   Title understand correct toileting to reduce strain on prolapse    Time 4  Period Weeks    Status New    Target Date 01/23/20      PT SHORT TERM GOAL #5   Title  understand ways to manage her prolapse to reduce strain    Time 4    Period Weeks    Status New    Target Date 01/23/20             PT Long Term Goals - 12/26/19 1037      PT LONG TERM GOAL #1   Title independent with advanced HEP    Time 12    Period Weeks    Status New    Target Date 03/19/20      PT LONG TERM GOAL #2   Title pelvic floor strength >/= 3/5 without substitutions for 10 seconds to improve pelvic support    Time 12    Period Weeks    Status New    Target Date 03/19/20      PT LONG TERM GOAL #3   Title understand ways to manage constipation and diarrhea with bowel health, abdominal massage, and correct breathing    Time 12    Period Weeks    Status New    Target Date 03/19/20      PT LONG TERM GOAL #4   Title urinary leakage decreased >/= 50% due to improve pelvic floor coordination and core strength    Time 12    Period Weeks    Status New    Target Date 03/19/20      PT LONG TERM GOAL #5   Title ability to control her gas >/= 50% due to improve pelvic floor coordination and strength    Time 12    Period Weeks    Target Date 03/19/20                  Plan - 12/26/19 1017    Clinical Impression Statement Patient is a 79 year old female with pelvic floor dysfunction including anterior wall prolapse, mixed urinary incontinence, constipation and diarrhea and pelvic floor weakness. Patient will leak urine with no activity causing it. At times she will be constipated and sometimes have diarrhea. Bilateral hip strength averages 4-/5.  Pelvic floor strength is 1/5 for 1 second but held longer she will compensate with hip adductors and gluteal contraction. She has tenderness located in the levator ani, obturator internist, bilateral sides of the urethra. she has the bladder moved down the vaginal canal just before the introitus. When she contracts the abdomen, she will bulge the lower abdominal region. Lower rib cage has decreased mobility. Tenderness  located in the right lower abdominal area. Patient will benefit from skilled therapy to improve pelvic floor coordination and strength to support the organs and reduce leakage.    Personal Factors and Comorbidities Comorbidity 3+;Fitness    Comorbidities A/P colporrhaphy, sacrospinous ligament fixation 03/12/2019; IBS; MVP; Osteopenia; Raynauds; Total hysterectomy    Examination-Activity Limitations Continence;Toileting;Lift;Stand;Squat    Examination-Participation Restrictions Community Activity    Stability/Clinical Decision Making Evolving/Moderate complexity    Clinical Decision Making Moderate    Rehab Potential Good    PT Frequency 1x / week    PT Duration 12 weeks    PT Treatment/Interventions ADLs/Self Care Home Management;Biofeedback;Electrical Stimulation;Therapeutic activities;Therapeutic exercise;Neuromuscular re-education;Manual techniques;Patient/family education;Dry needling    PT Next Visit Plan urge suppression; go over bladder irritants, timed voiding, lower abdominal contraction, work on pelvic floor manually, diaphragmatic    Consulted and Agree with Plan of Care Patient  Patient will benefit from skilled therapeutic intervention in order to improve the following deficits and impairments:  Decreased coordination, Decreased range of motion, Increased fascial restricitons  Visit Diagnosis: Muscle weakness (generalized) - Plan: PT plan of care cert/re-cert  Other lack of coordination - Plan: PT plan of care cert/re-cert  Mixed incontinence - Plan: PT plan of care cert/re-cert  Prolapse of anterior vaginal wall - Plan: PT plan of care cert/re-cert     Problem List Patient Active Problem List   Diagnosis Date Noted  . Cystocele with rectocele 12/13/2018  . Aortic atherosclerosis (HCC) 03/06/2018  . BMI 24.0-24.9, adult 11/01/2017  . Osteopenia 04/12/2017  . OSA on CPAP 11/27/2014  . IBS (irritable bowel syndrome) 11/11/2014  . Encounter for Medicare  annual wellness exam 11/11/2014  . Medication management 04/12/2014  . Abnormal blood sugar 10/10/2013  . Mixed hyperlipidemia 10/08/2013  . CAD (coronary artery disease) 02/03/2013  . MVP (mitral valve prolapse) 02/03/2013  . Vitamin D deficiency   . GERD (gastroesophageal reflux disease)   . COPD (chronic obstructive pulmonary disease) (HCC) 02/11/2010  . Essential hypertension 01/28/2010  . [facility] SYNDROME 01/28/2010    Eulis Foster, PT 12/26/19 10:44 AM   [facility] [address] [place], [place], [address] Phone: [phone]   Fax:  [phone]  Name: LYNDALL BELLOT MRN: [id number] Date of Birth: 15-Dec-1940

## 2019-12-27 ENCOUNTER — Other Ambulatory Visit: Payer: Self-pay | Admitting: Internal Medicine

## 2019-12-27 DIAGNOSIS — H906 Mixed conductive and sensorineural hearing loss, bilateral: Secondary | ICD-10-CM

## 2020-01-02 ENCOUNTER — Encounter: Payer: Self-pay | Admitting: Physical Therapy

## 2020-01-02 ENCOUNTER — Other Ambulatory Visit: Payer: Self-pay

## 2020-01-02 ENCOUNTER — Ambulatory Visit: Payer: Medicare PPO | Attending: Obstetrics and Gynecology | Admitting: Physical Therapy

## 2020-01-02 DIAGNOSIS — M6281 Muscle weakness (generalized): Secondary | ICD-10-CM | POA: Diagnosis not present

## 2020-01-02 DIAGNOSIS — N8110 Cystocele, unspecified: Secondary | ICD-10-CM | POA: Diagnosis not present

## 2020-01-02 DIAGNOSIS — R278 Other lack of coordination: Secondary | ICD-10-CM | POA: Diagnosis not present

## 2020-01-02 DIAGNOSIS — N3946 Mixed incontinence: Secondary | ICD-10-CM | POA: Diagnosis not present

## 2020-01-02 NOTE — Therapy (Signed)
[facility] [address], [address] [place], [place], [address] Phone: [phone]   Fax:  [phone]  Physical Therapy Treatment  Patient Details  Name: Jasmine Reeves MRN: [id number] Date of Birth: 08-Nov-1940 Referring Provider (PT): Dr. Dr. Sherian Maroon   Encounter Date: 01/02/2020   PT End of Session - 01/02/20 1355    Visit Number 2    Date for PT Re-Evaluation 03/19/20    Authorization Type [employer]    Authorization Time Period 12/26/19-03/20/2019    Authorization - Visit Number 1    Authorization - Number of Visits 12    PT Start Time 1400    PT Stop Time 1440    PT Time Calculation (min) 40 min    Activity Tolerance Patient tolerated treatment well;No increased pain    Behavior During Therapy WFL for tasks assessed/performed           Past Medical History:  Diagnosis Date  . COPD (chronic obstructive pulmonary disease) (HCC)   . Fibromyalgia   . GERD (gastroesophageal reflux disease)   . IBS (irritable bowel syndrome)   . Scoliosis     Past Surgical History:  Procedure Laterality Date  . ABDOMINAL HYSTERECTOMY    . APPENDECTOMY    . CORONARY ANGIOPLASTY  12/11  . CORONARY ARTERY BYPASS GRAFT    . CYSTOCELE REPAIR  03/12/2019   cystocele and rectocele repair, Dr. Jennette Kettle  . ESOPHAGOGASTRODUODENOSCOPY  07/21/12   polyps, Hiatal hernia Dr. Matthias Hughs  . TONSILLECTOMY    . TUBAL LIGATION      There were no vitals filed for this visit.   Subjective Assessment - 01/02/20 1401    Subjective I am having anxiety with therapy.    Pertinent History A/P coloporrhaphy, sacrospinaous ligament fixation 03/12/2019; Total abdominal hysterectomy; has hearing loss and getting a hearing aide; Degenrative scoliosis    Patient Stated Goals improve pelvic floor strength, redue urinary leakage and gas    Currently in Pain? Yes    Pain Score 5     Pain Location Abdomen    Pain Orientation Right;Lower    Pain Descriptors  / Indicators Aching    Pain Type Chronic pain    Pain Onset More than a month ago    Pain Frequency Intermittent    Aggravating Factors  pain just comes on    Pain Relieving Factors not sure what helps it    Multiple Pain Sites No                             [facility] Adult PT Treatment/Exercise - 01/02/20 0001      Self-Care   Self-Care Other Self-Care Comments    Other Self-Care Comments  discussed with patient on timed voiding but she seems to have more trouble with initiating the stream of urine.       Lumbar Exercises: Supine   Ab Set 10 reps;5 seconds    AB Set Limitations using ball squeeze; engage the lower abdominal more than the upper abdominals    Isometric Hip Flexion 10 reps;5 seconds    Isometric Hip Flexion Limitations contracting the lower abdominals, towel roll to influence posterior tilt      Manual Therapy   Manual Therapy Soft tissue mobilization;Myofascial release    Manual therapy comments educated patient on how to perform abdominal massage to improve intestinal mobility    Soft tissue mobilization circular massage to abdomane to promote  peristalic motion of the intestines    Myofascial Release release of the ilicecal valve and right lower quadrant to release around the psoas                  PT Education - 01/02/20 1440    Education Details Access Code: [email or web address]    Person(s) Educated Patient    Methods Explanation;Demonstration;Verbal cues;Handout    Comprehension Returned demonstration;Verbalized understanding            PT Short Term Goals - 12/26/19 1031      PT SHORT TERM GOAL #1   Title Ind with inital HEP    Time 4    Period Weeks    Status New    Target Date 01/23/20      PT SHORT TERM GOAL #2   Title understand what bladder irritants are and how they affect the urinary leakage    Time 4    Period Weeks    Status New    Target Date 01/23/20      PT SHORT TERM GOAL #3   Title able to contract the lower  abdominals correctly to not put pressure on the pelvic floor    Time 4    Period Weeks    Status New    Target Date 01/23/20      PT SHORT TERM GOAL #4   Title understand correct toileting to reduce strain on prolapse    Time 4    Period Weeks    Status New    Target Date 01/23/20      PT SHORT TERM GOAL #5   Title understand ways to manage her prolapse to reduce strain    Time 4    Period Weeks    Status New    Target Date 01/23/20             PT Long Term Goals - 12/26/19 1037      PT LONG TERM GOAL #1   Title independent with advanced HEP    Time 12    Period Weeks    Status New    Target Date 03/19/20      PT LONG TERM GOAL #2   Title pelvic floor strength >/= 3/5 without substitutions for 10 seconds to improve pelvic support    Time 12    Period Weeks    Status New    Target Date 03/19/20      PT LONG TERM GOAL #3   Title understand ways to manage constipation and diarrhea with bowel health, abdominal massage, and correct breathing    Time 12    Period Weeks    Status New    Target Date 03/19/20      PT LONG TERM GOAL #4   Title urinary leakage decreased >/= 50% due to improve pelvic floor coordination and core strength    Time 12    Period Weeks    Status New    Target Date 03/19/20      PT LONG TERM GOAL #5   Title ability to control her gas >/= 50% due to improve pelvic floor coordination and strength    Time 12    Period Weeks    Target Date 03/19/20                 Plan - 01/02/20 1358    Clinical Impression Statement Patient has more trouble with initiating a urine stream compared of geting to the bathroom. Patient was able  to engage the lower abdomen better by the end of therapy. She understands when she will contract her upper abdominals prior to lower abdominal. Patient continues to have pain in right lower quadrant. Patient understands how to perform abdominal massage to promote peristalic motion. Patient will benefit from skilled  therapy to improve pelvic floor coordination and strength to support the organs and reduce leakage.    Personal Factors and Comorbidities Comorbidity 3+;Fitness    Comorbidities A/P colporrhaphy, sacrospinous ligament fixation 03/12/2019; IBS; MVP; Osteopenia; Raynauds; Total hysterectomy    Examination-Activity Limitations Continence;Toileting;Lift;Stand;Squat    Examination-Participation Restrictions Community Activity    Stability/Clinical Decision Making Evolving/Moderate complexity    Rehab Potential Good    PT Frequency 1x / week    PT Duration 12 weeks    PT Treatment/Interventions ADLs/Self Care Home Management;Biofeedback;Electrical Stimulation;Therapeutic activities;Therapeutic exercise;Neuromuscular re-education;Manual techniques;Patient/family education;Dry needling    PT Next Visit Plan urge suppression; go over bladder irritants, , lower abdominal contraction, work on pelvic floor manually, diaphragmatic    PT Home Exercise Plan Access Code: [email or web address]    Consulted and Agree with Plan of Care Patient           Patient will benefit from skilled therapeutic intervention in order to improve the following deficits and impairments:  Decreased coordination, Decreased range of motion, Increased fascial restricitons  Visit Diagnosis: Muscle weakness (generalized)  Other lack of coordination  Mixed incontinence  Prolapse of anterior vaginal wall     Problem List Patient Active Problem List   Diagnosis Date Noted  . Cystocele with rectocele 12/13/2018  . Aortic atherosclerosis (HCC) 03/06/2018  . BMI 24.0-24.9, adult 11/01/2017  . Osteopenia 04/12/2017  . OSA on CPAP 11/27/2014  . IBS (irritable bowel syndrome) 11/11/2014  . Encounter for Medicare annual wellness exam 11/11/2014  . Medication management 04/12/2014  . Abnormal blood sugar 10/10/2013  . Mixed hyperlipidemia 10/08/2013  . CAD (coronary artery disease) 02/03/2013  . MVP (mitral valve prolapse) 02/03/2013   . Vitamin D deficiency   . GERD (gastroesophageal reflux disease)   . COPD (chronic obstructive pulmonary disease) (HCC) 02/11/2010  . Essential hypertension 01/28/2010  . [facility] SYNDROME 01/28/2010    Eulis Foster, PT 01/02/20 2:51 PM   [facility] [address], [address] [place], [place], [address] Phone: [phone]   Fax:  [phone]  Name: Jasmine Reeves MRN: [id number] Date of Birth: 16-Aug-1940

## 2020-01-02 NOTE — Patient Instructions (Signed)
Access Code: [email or web address] URL: [email or web address] Date: 01/02/2020 Prepared by: Eulis Foster  Exercises Hooklying Isometric Hip Flexion - 1 x daily - 7 x weekly - 1 sets - 10 reps - 5 sec hold Supine Hip Adduction Isometric with Ball - 1 x daily - 7 x weekly - 1 sets - 10 reps - 5 sec hold Supine Abdominal Wall Massage - 1 x daily - 7 x weekly - 3 sets - 10 reps [facility] [address] [place], [place] [address] Phone # [phone] Fax [phone]

## 2020-01-07 DIAGNOSIS — K219 Gastro-esophageal reflux disease without esophagitis: Secondary | ICD-10-CM | POA: Diagnosis not present

## 2020-01-07 DIAGNOSIS — Z86010 Personal history of colonic polyps: Secondary | ICD-10-CM | POA: Diagnosis not present

## 2020-01-07 DIAGNOSIS — R198 Other specified symptoms and signs involving the digestive system and abdomen: Secondary | ICD-10-CM | POA: Diagnosis not present

## 2020-01-07 DIAGNOSIS — Z79899 Other long term (current) drug therapy: Secondary | ICD-10-CM | POA: Diagnosis not present

## 2020-01-07 DIAGNOSIS — R159 Full incontinence of feces: Secondary | ICD-10-CM | POA: Diagnosis not present

## 2020-01-07 DIAGNOSIS — K5901 Slow transit constipation: Secondary | ICD-10-CM | POA: Diagnosis not present

## 2020-01-09 ENCOUNTER — Other Ambulatory Visit: Payer: Self-pay | Admitting: Internal Medicine

## 2020-01-09 ENCOUNTER — Encounter: Payer: Medicare PPO | Admitting: Physical Therapy

## 2020-01-09 DIAGNOSIS — I10 Essential (primary) hypertension: Secondary | ICD-10-CM

## 2020-01-09 DIAGNOSIS — E782 Mixed hyperlipidemia: Secondary | ICD-10-CM

## 2020-01-16 ENCOUNTER — Ambulatory Visit: Payer: Medicare PPO | Admitting: Physical Therapy

## 2020-01-28 DIAGNOSIS — H6123 Impacted cerumen, bilateral: Secondary | ICD-10-CM | POA: Diagnosis not present

## 2020-01-28 DIAGNOSIS — H903 Sensorineural hearing loss, bilateral: Secondary | ICD-10-CM | POA: Diagnosis not present

## 2020-01-30 ENCOUNTER — Other Ambulatory Visit: Payer: Self-pay

## 2020-01-30 ENCOUNTER — Ambulatory Visit: Payer: Medicare PPO | Attending: Obstetrics and Gynecology | Admitting: Physical Therapy

## 2020-01-30 ENCOUNTER — Encounter: Payer: Self-pay | Admitting: Physical Therapy

## 2020-01-30 DIAGNOSIS — N8110 Cystocele, unspecified: Secondary | ICD-10-CM | POA: Diagnosis not present

## 2020-01-30 DIAGNOSIS — M6281 Muscle weakness (generalized): Secondary | ICD-10-CM | POA: Diagnosis not present

## 2020-01-30 DIAGNOSIS — N3946 Mixed incontinence: Secondary | ICD-10-CM | POA: Diagnosis not present

## 2020-01-30 DIAGNOSIS — R278 Other lack of coordination: Secondary | ICD-10-CM

## 2020-01-30 NOTE — Patient Instructions (Addendum)
About Pelvic Support Problems Pelvic Support Problems Explained Ligaments, muscles, and connective tissue normally hold your bladder, uterus, and other organs in their proper places in your pelvis. When these tissues become weak, a problem with pelvic support may result. Weak support can cause one or more of the pelvic organs to drop down into the vagina. An organ may even drop so far that is partially exposed outside the body.  Pelvic support problems are named by the change in the organ. The main types of pelvic support problems are:  . Cystocele: When the bladder drops down into your vagina.  . Enterocele: When your small intestine drops between your vagina and rectum.  . Rectocele: When your rectum bulges into the vaginal wall.  Marland Kitchen Uterine prolapse: When your uterus drops into your vagina.  . Vaginal prolapse: When the top part of the vagina begins to droop. This sometimes happens after a hysterectomy (removal of the uterus).  Causes Pelvic support problems can be caused by many conditions. They may begin after you give birth, especially if you had a large baby. During childbirth, the muscles and skin of the birth canal (vagina) are stretched and sometimes torn. They heal over time but are not always exactly the same. A long pushing stage of labor may also weaken these tissues as well as very rapid births as the tissues do not have time to stretch so they tear.  Also, after menopause, there are changes in the vaginal walls resulting from a decrease in estrogen. Estrogen helps to keep the tissues toned. Low levels of estrogen weaken the vaginal walls and may cause the bladder to shift from its normal position. As women get older, the loss of muscle tone and the relaxation of muscles may cause the uterus or other organs to drop.  Over time, conditions like chronic coughing, chronic constipation, doing a lot of heavy lifting, straining to pass stool, and obesity, can also weaken the pelvic support  muscles.  Diagnosing Pelvic Support Problems Your health care provider will ask about your symptoms and do a pelvic examination. Your provider may also do a rectal exam during your pelvic exam. Your provider may ask you to: 1. Bear down and push (like you are having a bowel movement) so he or she can see if your bladder or other part of your body protrudes into the vagina. 2. Contract the muscles of your pelvis to check the strength of your pelvic muscles.  3. Do several types of urine, nerve and muscle tests of the pelvis and around the bladder to see what type of treatment is best for you.   Symptoms Symptoms of pelvic support problems depend on the organ involved, but may include:  . urine leakage  . stain or fecal loss after a bowel movement . trouble having bowel movements  . ache in the lower abdomen, groin, or lower back  . bladder infection  . a feeling of heaviness, pulling, or fullness in the pelvis, or a feeling that something is falling out of the vagina  . an organ protruding from your vaginal opening  . feeling the need to support the organs or perineal area to empty bladder or bowels . painful sexual intercourse.  Many women feel pelvic pressure or trouble holding their urine immediately after childbirth. For some, these symptoms go away permanently, in others they return as they get older.  Treatment Options A prolapsed organ cannot repair itself. Contact your health care provider as soon as you notice symptoms  of a problem. Treatment depends on what the specific problem is and how far advanced it is.  . The symptoms caused by some pelvic support problems may simply be treated with changes in diet, medicine to soften the stool, weight loss, or avoiding strenuous activities. You may also do pelvic floor exercises to help strengthen your pelvic muscles.  . Some cases of prolapse may require a special support device made from plastic or rubber called a pessary that fits into the  vagina to support the uterus, vagina, or bladder. A pessary can also help women who leak urine when coughing, straining, or exercising. In mild cases, a tampon or vaginal diaphragm may be used instead of a pessary.  Talk to your doctor or health care provider about these options. . In serious cases, surgery may be needed to put the organs back into their proper place. The uterus may be removed because of the pressure it puts on the bladder.  Your doctor will know what surgery will be best for you. How can I prevent pelvic support problems?  You can help prevent pelvic support problems by:  . maintaining a healthy lifestyle  . continuing to do pelvic floor exercises after you deliver a baby  . maintaining a healthy weight  . avoiding a lot of heavy lifting and lifting with your legs (not from your waist)  treating constipation and avoid getting   Ways to protect prolapse Quitting smoking. Treating conditions that might put strain on the pelvic floor, such as a chronic cough or constipation. Losing weight. Strengthening your core and your pelvic floor. Avoiding heavy lifting, pushing, pulling, and bending When lifting using proper body mechanics Not straining during bowel movements. High fiber diet, 30 g, to reduce chance of constipation Drink 6-8 glasses of water per day Avoid being on your feet for long periods of time Middle of the day lay on your back with your feet propped up for 5-10 minutes  Toileting Techniques for Bowel Movements (Defecation) Using your belly (abdomen) and pelvic floor muscles to have a bowel movement is usually instinctive.  Sometimes people can have problems with these muscles and have to relearn proper defecation (emptying) techniques.  If you have weakness in your muscles, organs that are falling out, decreased sensation in your pelvis, or ignore your urge to go, you may find yourself straining to have a bowel movement.  You are straining if you are: . holding  your breath or taking in a huge gulp of air and holding it  . keeping your lips and jaw tensed and closed tightly . turning red in the face because of excessive pushing or forcing . developing or worsening your  hemorrhoids . getting faint while pushing . not emptying completely and have to defecate many times a day  If you are straining, you are actually making it harder for yourself to have a bowel movement.  Many people find they are pulling up with the pelvic floor muscles and closing off instead of opening the anus. Due to lack pelvic floor relaxation and coordination the abdominal muscles, one has to work harder to push the feces out.  Many people have never been taught how to defecate efficiently and effectively.  Notice what happens to your body when you are having a bowel movement.  While you are sitting on the toilet pay attention to the following areas:  [facility] [address] [place], [place] [address] Phone # [phone] Fax [phone]  . Jaw  and mouth position . Angle of your hips   . Whether your feet touch the ground or not . Arm placement  . Spine position . Waist . Belly tension . Anus (opening of the anal canal)  An Evacuation/Defecation Plan   Here are the 4 basic points:  1. Lean forward enough for your elbows to rest on your knees 2. Support your feet on the floor or use a low stool if your feet don't touch the floor  3. Push out your belly as if you have swallowed a beach ball--you should feel a widening of your waist 4. Open and relax your pelvic floor muscles, rather than tightening around the anus       The following conditions my require modifications to your toileting posture:  . If you have had surgery in the past that limits your back, hip, pelvic, knee or ankle flexibility . Constipation   Your healthcare practitioner may make the following additional suggestions and adjustments:  1) Sit on the toilet  a) Make  sure your feet are supported. b) Notice your hip angle and spine position--most people find it effective to lean forward or raise their knees, which can help the muscles around the anus to relax  c) When you lean forward, place your forearms on your thighs for support  2) Relax suggestions a) Breath deeply in through your nose and out slowly through your mouth as if you are smelling the flowers and blowing out the candles. b) To become aware of how to relax your muscles, contracting and releasing muscles can be helpful.  Pull your pelvic floor muscles in tightly by using the image of holding back gas, or closing around the anus (visualize making a circle smaller) and lifting the anus up and in.  Then release the muscles and your anus should drop down and feel open. Repeat 5 times ending with the feeling of relaxation. c) Keep your pelvic floor muscles relaxed; let your belly bulge out. d) The digestive tract starts at the mouth and ends at the anal opening, so be sure to relax both ends of the tube.  Place your tongue on the roof of your mouth with your teeth separated.  This helps relax your mouth and will help to relax the anus at the same time.  3) Empty (defecation) a) Keep your pelvic floor and sphincter relaxed, then bulge your anal muscles.  Make the anal opening wide.  b) Stick your belly out as if you have swallowed a beach ball. c) Make your belly wall hard using your belly muscles while continuing to breathe. Doing this makes it easier to open your anus. d) Breath out and give a grunt (or try using other sounds such as ahhhh, shhhhh, ohhhh or grrrrrrr).  4) Finish a) As you finish your bowel movement, pull the pelvic floor muscles up and in.  This will leave your anus in the proper place rather than remaining pushed out and down. If you leave your anus pushed out and down, it will start to feel as though that is normal and give you incorrect signals about needing to have a bowel  movement. Access Code: [email or web address] URL: [email or web address] Date: 01/30/2020 Prepared by: Eulis Foster  Exercises Hooklying Isometric Hip Flexion - 1 x daily - 7 x weekly - 1 sets - 10 reps - 5 sec hold Supine Hip Adduction Isometric with Ball - 1 x daily - 7 x weekly - 1 sets - 10 reps - 5 sec hold  Supine Abdominal Wall Massage - 1 x daily - 7 x weekly - 3 sets - 10 reps Supine Bridge - 1 x daily - 7 x weekly - 1 sets - 10 reps [facility] [address] [place], [place] [address] Phone # [phone] Fax [phone]

## 2020-01-30 NOTE — Therapy (Signed)
[facility] [address] [place], [place], [address] Phone: [phone]   Fax:  [phone]  Physical Therapy Treatment  Patient Details  Name: Jasmine Reeves MRN: [id number] Date of Birth: 11/27/1940 Referring Provider (PT): Dr. Dr. Sherian Maroon   Encounter Date: 01/30/2020   PT End of Session - 01/30/20 1404    Visit Number 3    Date for PT Re-Evaluation 03/19/20    Authorization Time Period 12/26/19-03/20/2019    Authorization - Visit Number 2    Authorization - Number of Visits 12    PT Start Time 1400    PT Stop Time 1440    PT Time Calculation (min) 40 min    Activity Tolerance Patient tolerated treatment well;No increased pain    Behavior During Therapy WFL for tasks assessed/performed           Past Medical History:  Diagnosis Date  . COPD (chronic obstructive pulmonary disease) (HCC)   . Fibromyalgia   . GERD (gastroesophageal reflux disease)   . IBS (irritable bowel syndrome)   . Scoliosis     Past Surgical History:  Procedure Laterality Date  . ABDOMINAL HYSTERECTOMY    . APPENDECTOMY    . CORONARY ANGIOPLASTY  12/11  . CORONARY ARTERY BYPASS GRAFT    . CYSTOCELE REPAIR  03/12/2019   cystocele and rectocele repair, Dr. Jennette Kettle  . ESOPHAGOGASTRODUODENOSCOPY  07/21/12   polyps, Hiatal hernia Dr. Matthias Hughs  . TONSILLECTOMY    . TUBAL LIGATION      There were no vitals filed for this visit.   Subjective Assessment - 01/30/20 1400    Subjective I was not able to come due to a funeral and having cold symptoms and the following week I was out of town. I feel like the prolapse has dropped more.    Pertinent History A/P coloporrhaphy, sacrospinaous ligament fixation 03/12/2019; Total abdominal hysterectomy; has hearing loss and getting a hearing aide; Degenrative scoliosis    Patient Stated Goals improve pelvic floor strength, redue urinary leakage and gas                              [facility] Adult PT Treatment/Exercise - 01/30/20 0001      Self-Care   Self-Care Other Self-Care Comments    Other Self-Care Comments  education on prolapse management with laying on 1 pillow to lift the pelvis      Therapeutic Activites    Therapeutic Activities Other Therapeutic Activities;ADL's    ADL's breath out with supine to sitting    Other Therapeutic Activities instruction on correct toileting with knees above hips, breathing correctly, relaxing her mouth      Lumbar Exercises: Supine   Ab Set 5 seconds;15 reps    AB Set Limitations using ball squeeze; engage the lower abdominal more than the upper abdominals    Bridge 10 reps;1 second    Bridge Limitations with verbal cues to raise her hips    Isometric Hip Flexion 10 reps;5 seconds    Isometric Hip Flexion Limitations contracting the lower abdominals, towel roll to influence posterior tilt                  PT Education - 01/30/20 1443    Education Details Access Code: [email or web address]; toileting, information on prolapse and management    Person(s) Educated Patient    Methods Explanation;Demonstration;Verbal cues;Handout    Comprehension Returned demonstration;Verbalized understanding  PT Short Term Goals - 01/30/20 1409      PT SHORT TERM GOAL #1   Title Ind with inital HEP    Time 4    Period Weeks    Status Achieved      PT SHORT TERM GOAL #2   Title understand what bladder irritants are and how they affect the urinary leakage    Time 4    Period Weeks    Status On-going      PT SHORT TERM GOAL #3   Title able to contract the lower abdominals correctly to not put pressure on the pelvic floor    Time 4    Period Weeks    Status On-going             PT Long Term Goals - 12/26/19 1037      PT LONG TERM GOAL #1   Title independent with advanced HEP    Time 12    Period Weeks    Status New    Target Date 03/19/20      PT LONG TERM GOAL #2   Title  pelvic floor strength >/= 3/5 without substitutions for 10 seconds to improve pelvic support    Time 12    Period Weeks    Status New    Target Date 03/19/20      PT LONG TERM GOAL #3   Title understand ways to manage constipation and diarrhea with bowel health, abdominal massage, and correct breathing    Time 12    Period Weeks    Status New    Target Date 03/19/20      PT LONG TERM GOAL #4   Title urinary leakage decreased >/= 50% due to improve pelvic floor coordination and core strength    Time 12    Period Weeks    Status New    Target Date 03/19/20      PT LONG TERM GOAL #5   Title ability to control her gas >/= 50% due to improve pelvic floor coordination and strength    Time 12    Period Weeks    Target Date 03/19/20                 Plan - 01/30/20 1412    Clinical Impression Statement Patient has not been to therapy due to a funeral, cold symptoms and holidays. Patient has done the exercises occasionally. Patient has been educated on correct toileting, ways to manage the prolapse, and laying on back with pillows under her hips. Patient was able to contract the lower abdominals correctly by the end of the therapy. Patient will benefit from skilled therapy to improve pelvic floor coordination, and strength to support the organs and reduce leakage.    Personal Factors and Comorbidities Comorbidity 3+;Fitness    Comorbidities A/P colporrhaphy, sacrospinous ligament fixation 03/12/2019; IBS; MVP; Osteopenia; Raynauds; Total hysterectomy    Examination-Activity Limitations Continence;Toileting;Lift;Stand;Squat    Examination-Participation Restrictions Community Activity    Stability/Clinical Decision Making Evolving/Moderate complexity    Rehab Potential Good    PT Frequency 1x / week    PT Duration 12 weeks    PT Treatment/Interventions ADLs/Self Care Home Management;Biofeedback;Electrical Stimulation;Therapeutic activities;Therapeutic exercise;Neuromuscular  re-education;Manual techniques;Patient/family education;Dry needling    PT Next Visit Plan urge suppression; go over bladder irritants, , lower abdominal contraction with arm movements, review toileting, work on pelvic floor manually, diaphragmatic    PT Home Exercise Plan Access Code: [email or web address]    Consulted and Agree with  Plan of Care Patient           Patient will benefit from skilled therapeutic intervention in order to improve the following deficits and impairments:  Decreased coordination, Decreased range of motion, Increased fascial restricitons  Visit Diagnosis: Muscle weakness (generalized)  Other lack of coordination  Mixed incontinence  Prolapse of anterior vaginal wall     Problem List Patient Active Problem List   Diagnosis Date Noted  . Cystocele with rectocele 12/13/2018  . Aortic atherosclerosis (HCC) 03/06/2018  . BMI 24.0-24.9, adult 11/01/2017  . Osteopenia 04/12/2017  . OSA on CPAP 11/27/2014  . IBS (irritable bowel syndrome) 11/11/2014  . Encounter for Medicare annual wellness exam 11/11/2014  . Medication management 04/12/2014  . Abnormal blood sugar 10/10/2013  . Mixed hyperlipidemia 10/08/2013  . CAD (coronary artery disease) 02/03/2013  . MVP (mitral valve prolapse) 02/03/2013  . Vitamin D deficiency   . GERD (gastroesophageal reflux disease)   . COPD (chronic obstructive pulmonary disease) (HCC) 02/11/2010  . Essential hypertension 01/28/2010  . [facility] SYNDROME 01/28/2010    Eulis Foster, PT 01/30/20 2:50 PM   [facility] [address] [place], [place], [address] Phone: [phone]   Fax:  [phone]  Name: ADAJA WANDER MRN: [id number] Date of Birth: 1940-06-22

## 2020-02-06 ENCOUNTER — Ambulatory Visit: Payer: Medicare PPO | Admitting: Physical Therapy

## 2020-02-12 ENCOUNTER — Ambulatory Visit: Payer: Medicare PPO | Admitting: Adult Health

## 2020-02-12 ENCOUNTER — Encounter: Payer: Self-pay | Admitting: Adult Health

## 2020-02-12 ENCOUNTER — Other Ambulatory Visit: Payer: Self-pay | Admitting: Internal Medicine

## 2020-02-12 ENCOUNTER — Other Ambulatory Visit: Payer: Self-pay

## 2020-02-12 VITALS — BP 126/72 | HR 66 | Temp 97.2°F | Wt 138.8 lb

## 2020-02-12 DIAGNOSIS — Z79899 Other long term (current) drug therapy: Secondary | ICD-10-CM | POA: Diagnosis not present

## 2020-02-12 DIAGNOSIS — E782 Mixed hyperlipidemia: Secondary | ICD-10-CM

## 2020-02-12 DIAGNOSIS — I700 Atherosclerosis of aorta: Secondary | ICD-10-CM | POA: Diagnosis not present

## 2020-02-12 DIAGNOSIS — I25118 Atherosclerotic heart disease of native coronary artery with other forms of angina pectoris: Secondary | ICD-10-CM

## 2020-02-12 DIAGNOSIS — E559 Vitamin D deficiency, unspecified: Secondary | ICD-10-CM

## 2020-02-12 DIAGNOSIS — R7309 Other abnormal glucose: Secondary | ICD-10-CM | POA: Diagnosis not present

## 2020-02-12 DIAGNOSIS — I10 Essential (primary) hypertension: Secondary | ICD-10-CM | POA: Diagnosis not present

## 2020-02-12 DIAGNOSIS — Z9989 Dependence on other enabling machines and devices: Secondary | ICD-10-CM

## 2020-02-12 DIAGNOSIS — G4733 Obstructive sleep apnea (adult) (pediatric): Secondary | ICD-10-CM | POA: Diagnosis not present

## 2020-02-12 DIAGNOSIS — Z6824 Body mass index (BMI) 24.0-24.9, adult: Secondary | ICD-10-CM

## 2020-02-12 MED ORDER — PREDNISONE 20 MG PO TABS: ORAL_TABLET | ORAL | 0 refills | Status: DC

## 2020-02-12 MED ORDER — METHOCARBAMOL 500 MG PO TABS: 500.0000 mg | ORAL_TABLET | Freq: Three times a day (TID) | ORAL | 0 refills | Status: DC | PRN

## 2020-02-12 NOTE — Patient Instructions (Addendum)
Try senokot in addition to miralax,   Miralax every 2 hours with water until you have  BP  Step stool  May try applying pressure on posterior vaginal wall when having a BM  Follow up tomorrow if no BM    Constipation, Adult Constipation is when a person:  Poops (has a bowel movement) fewer times in a week than normal.  Has a hard time pooping.  Has poop that is dry, hard, or bigger than normal. Follow these instructions at home: Eating and drinking   Eat foods that have a lot of fiber, such as: ? Fresh fruits and vegetables. ? Whole grains. ? Beans.  Eat less of foods that are high in fat, low in fiber, or overly processed, such as: ? [place] fries. ? Hamburgers. ? Cookies. ? Candy. ? Soda.  Drink enough fluid to keep your pee (urine) clear or pale yellow. General instructions  Exercise regularly or as told by your doctor.  Go to the restroom when you feel like you need to poop. Do not hold it in.  Take over-the-counter and prescription medicines only as told by your doctor. These include any fiber supplements.  Do pelvic floor retraining exercises, such as: ? Doing deep breathing while relaxing your lower belly (abdomen). ? Relaxing your pelvic floor while pooping.  Watch your condition for any changes.  Keep all follow-up visits as told by your doctor. This is important. Contact a doctor if:  You have pain that gets worse.  You have a fever.  You have not pooped for 4 days.  You throw up (vomit).  You are not hungry.  You lose weight.  You are bleeding from the anus.  You have thin, pencil-like poop (stool). Get help right away if:  You have a fever, and your symptoms suddenly get worse.  You leak poop or have blood in your poop.  Your belly feels hard or bigger than normal (is bloated).  You have very bad belly pain.  You feel dizzy or you faint. This information is not intended to replace advice given to you by your health care  provider. Make sure you discuss any questions you have with your health care provider. Document Revised: 01/28/2017 Document Reviewed: 08/06/2015 Elsevier Patient Education  2020 [employer].     About Rectocele  Overview  A rectocele is a type of hernia which causes different degrees of bulging of the rectal tissues into the vaginal wall.  You may even notice that it presses against the vaginal wall so much that some vaginal tissues droop outside of the opening of your vagina.  Causes of Rectocele  The most common cause is childbirth.  The muscles and ligaments in the pelvis that hold up and support the female organs and vagina become stretched and weakened during labor and delivery.  The more babies you have, the more the support tissues are stretched and weakened.  Not everyone who has a baby will develop a rectocele.  Some women have stronger supporting tissue in the pelvis and may not have as much of a problem as others.  Women who have a Cesarean section usually do not get rectocele's unless they pushed a long time prior to the cesarean delivery.  Other conditions that can cause a rectocele include chronic constipation, a chronic cough, a lot of heavy lifting, and obesity.  Older women may have this problem because the loss of female hormones causes the vaginal tissue to become weaker.  Symptoms  There may not be any symptoms.  If you do have symptoms, they may include:  Pelvic pressure in the rectal area  Protrusion of the lower part of the vagina through the opening of the vagina  Constipation and trapping of the stool, making it difficult to have a bowel movement.  In severe cases, you may have to press on the lower part of your vagina to help push the stool out of you rectum.  This is called splinting to empty.  Diagnosing Rectocele  Your health care provider will ask about your symptoms and perform a pelvic exam.  S/he will ask you to bear down, pushing like you are having  a bowel movement so as to see how far the lower part of the vagina protrudes into the vagina and possible outside of the vagina.  Your provider will also ask you to contract the muscles of your pelvis (like you are stopping the stream in the middle of urinating) to determine the strength of your pelvic muscles.  Your provider may also do a rectal exam.  Treatment Options  If you do not have any symptoms, no treatment may be necessary.  Other treatment options include:  Pelvic floor exercises: Contracting the muscles in your genital area may help strengthen your muscles and support the organs.  Be sure to get proper exercise instruction from you physical therapist.  A pessary (removealbe pelvic support device) sometimes helps rectocele symptoms.  Surgery: Surgical repair may be necessary. In some cases the uterus may need to be taken out ( a hysterectomy) as well.  There are many types of surgery for pelvic support problems.  Look for physicians who specialize in repair procedures.  You can take care of yourself by:  Treating and preventing constipation  Avoiding heavy lifting, and lifting correctly (with your legs, not with you waist or back)  Treating a chronic cough or bronchitis  Not smoking  avoiding too much weight gain  Doing pelvic floor exercises   2007, [employer] Doc.33

## 2020-02-12 NOTE — Progress Notes (Signed)
3 MONTH FOLLOW UP  Assessment:     Atherosclerosis of aorta Per CT 2018 Control blood pressure, cholesterol, glucose, increase exercise.   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  Prescribed nitroglycerine without recent use Followed by cardiology  Essential hypertension At goal; continue medications Monitor blood pressure at home; call if consistently over 130/80 Continue DASH diet.   Reminder to go to the ER if any CP, SOB, nausea, dizziness, severe HA, changes vision/speech, left arm numbness and tingling and jaw pain.  Chronic obstructive pulmonary disease, unspecified COPD type (HCC) By imaging; denies notable symptoms of dyspnea, secretions, continue to monitor. Remote hx of smoking.   OSA on CPAP Reports typically wears 100% of the time and endorses restorative sleep  Gastroesophageal reflux disease, esophagitis presence not specified Taking PPI BID, H2i didn't help; carafate PRN, GI following Discussed diet, avoiding triggers and other lifestyle changes  Abnormal blood sugar Has been prediabetic; most recent A1C at goal Discussed disease and risks Discussed diet/exercise, weight management   Medication management -     CBC with Differential/Platelet -     CMP/GFR -     Magnesium   Mixed hyperlipidemia At goal; continue medication Continue low cholesterol diet and exercise.  -     Lipid panel -     TSH  Vitamin D deficiency At goal at recent check; continue to recommend supplementation for goal of 70-100 Defer vitamin D level  Cystocele/recocele, recurrent prolapse  S/p repair by Dr. Jennette Kettle, now following with Dr. Deno Etienne, pelvic floor therapy   Constipation Suspect chronic constipation/IBS complicated by rectocele Take miralax with 2 glasses of water q2h until has BM Use stool to lift legs with BMand demonstrated  applying vaginal wall pressure when having BM Then add senokot to miralax  Follow up tomorrow if no BM within the next 12 hours with above plan Follow up as scheduled with Dr. Deno Etienne this week for rectocele follow up Please go to the ER if you have any severe AB pain, unable to hold down food/water, blood in stool or vomit, chest pain, shortness of breath, or any worsening symptoms.   Over 40 minutes of exam, counseling, chart review and critical decision making was performed Future Appointments  Date Time Provider Department Center  02/14/2020  3:30 PM Theressa Millard, PT [facility]  02/20/2020  2:00 PM Theressa Millard, PT [facility]  02/27/2020  9:30 AM Theressa Millard, PT [facility]  03/04/2020  2:20 PM Lennette Bihari, MD [facility]  05/12/2020  2:30 PM [name], MD [facility] None  08/14/2020  2:00 PM Judd Gaudier, NP [facility] None  11/17/2020 11:00 AM [name], MD [facility] None     Subjective:  Jasmine Reeves is a 79 y.o. female who presents for 3 month follow up.    She is primary caregiver for husband, was recently admitted due to fecal impaction with confusion, now in SNF/rehab.   She reports seems to have strained her back last week helping husband, lower back, taking aleve and seems to be improvement. Also with IBS, recently stressed and having constipation more than usual, typically every 2-3 days, has been taking miralax and another OTC softener, had blowout 3-4 days ago, no BMs since. Also taking citrucel.   Hx of 2 vaginal deliveries, s/p hysterectomy with recently s/p surgical repair for cystocele/rectocele, "bladder tack" on 03/12/2019 by Dr. Jennette Kettle.  Had some complications, was evaluated by Dr. Deno Etienne affiliated with [facility] in 09/2019 with recurrent prolapse, has recommended pelvic floor physical therapy (with Eulis Foster), pessary, vaginal estrogens, trying to avoid repeat surgery. Has follow up this week. Was recommended using stool with  BMs, vaginal wall pressure but hasn't tried this.   she has a diagnosis of GERD which is currently managed by protonix 40 mg BID, has tapered off of carafate, used PRN only, has follow up with Dr. Matthias Hughs.   BMI is Body mass index is 24.2 kg/m., she has been working on diet, no exercise recently due to husband's health.  Wt Readings from Last 3 Encounters:  02/12/20 138 lb 12.8 oz (63 kg)  10/30/19 146 lb (66.2 kg)  09/14/19 147 lb 9.6 oz (67 kg)   She has hx of PCA x 2 In Nov & Dec 2001, and CABG in Jan 2002. , she underwent CABG. Had reassuring myocardial perfusion and ECHO in 12/2018 other than known MVP and mild aortic stenosis Continues to follow up with Dr. Tresa Endo. She has aortic atherosclerosis and COPD per CXR in 2018/2019  Her blood pressure has been controlled at home, today their BP is BP: 126/72 She does not workout. She denies chest pain, shortness of breath, dizziness.   She is on cholesterol medication (vytorin 10/20) and denies myalgias. Her cholesterol is at goal. The cholesterol last visit was:   Lab Results  Component Value Date   CHOL 163 10/30/2019   HDL 73 10/30/2019   LDLCALC 73 10/30/2019   TRIG 88 10/30/2019   CHOLHDL 2.2 10/30/2019    She has been working on diet and exercise for glucose management, and denies increased appetite, nausea, paresthesia of the feet, polydipsia, polyuria, visual disturbances, vomiting and weight loss. 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 aspirin 81 MG tablet Take 81 mg by mouth daily.    Marland Kitchen CALCIUM CITRATE PO Take 500 mg by mouth  daily.    . Cholecalciferol (VITAMIN D) 2000 UNITS tablet Take 2,000 Units by mouth 2 (two) times daily.    Marland Kitchen diltiazem (DILT-XR) 180 MG 24 hr capsule TAKE ONE CAPSULE BY MOUTH DAILY 90 capsule 1  . ezetimibe-simvastatin (VYTORIN) 10-20 MG tablet TAKE 1 TABLET BY MOUTH DAILY FOR CHOLESTEROL 90 tablet 1  . fexofenadine (ALLEGRA) 180 MG tablet Take 180 mg by mouth daily.    . fluticasone (VERAMYST) 27.5 MCG/SPRAY nasal spray Place 2 sprays into the nose daily as needed for rhinitis. 10 g 8  . IRON PO Take 50 mg by mouth daily.    Marland Kitchen labetalol (NORMODYNE) 100 MG tablet Take 1 tablet (100 mg total) by mouth at bedtime. 90 tablet 3  . Magnesium 400 MG CAPS Take by mouth daily.    . meloxicam (MOBIC) 15 MG tablet Take 1/2 to 1 tablet Daily with Food for Pain & Inflammation 90 tablet 3  . mirabegron ER (MYRBETRIQ) 50 MG TB24 tablet Take      1 tablet       Daily  for Bladder 90 tablet 0  . Multiple Vitamins-Minerals (MULTIVITAMIN WITH MINERALS) tablet Take 1 tablet by mouth daily.    . nitroGLYCERIN (NITROSTAT) 0.4 MG SL tablet Place 1 tablet (0.4 mg total) under the tongue every 5 (five) minutes as needed for chest pain. 25 tablet 12  . pantoprazole (PROTONIX) 40 MG tablet Take 1 tablet 2 x /day for Acid Indigestion & Reflux 180 tablet 3  . sucralfate (CARAFATE) 1 g tablet TAKE 1 TABLET(1 GRAM) BY MOUTH FOUR TIMES DAILY 360 tablet 0  . triamcinolone ointment (KENALOG) 0.1 % Apply 1 application topically 2 (two) times daily. (Patient taking differently: Apply 1 application topically as needed.) 80 g 1  . vitamin C (ASCORBIC ACID) 500 MG tablet Take 500 mg by mouth daily.    . Zinc 50 MG TABS Take by mouth daily.    Marland Kitchen METHOCARBAMOL PO Take by mouth as needed. (Patient not taking: Reported on 02/12/2020)     No current facility-administered medications on file prior to visit.    Allergies  Allergen Reactions  . Altace [Ramipril]     cough  . Azithromycin     palpitation  . Celebrex  [Celecoxib]     Gi upset   . Hydrochlorothiazide     cramping  . 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 Prilosec [Omeprazole]     constipation    Current Problems (verified) Patient Active Problem List   Diagnosis Date Noted  . Cystocele with rectocele 12/13/2018  . Aortic atherosclerosis (HCC) 03/06/2018  . BMI 24.0-24.9, adult 11/01/2017  . Osteopenia 04/12/2017  . OSA on CPAP 11/27/2014  . IBS (irritable bowel syndrome) 11/11/2014  . Encounter for Medicare annual wellness exam 11/11/2014  . Medication management 04/12/2014  . Abnormal blood sugar 10/10/2013  . Mixed hyperlipidemia 10/08/2013  . CAD (coronary artery disease) 02/03/2013  . MVP (mitral valve prolapse) 02/03/2013  . Vitamin D deficiency   . GERD (gastroesophageal reflux disease)   . COPD (chronic obstructive pulmonary disease) (HCC) 02/11/2010  . Essential hypertension 01/28/2010  . RAYNAUDS SYNDROME 01/28/2010    SURGICAL HISTORY She  has a past surgical history that includes Tonsillectomy; Appendectomy; Abdominal hysterectomy; Tubal ligation; Coronary artery bypass graft; Coronary angioplasty (12/11); Esophagogastroduodenoscopy (07/21/12); and Cystocele repair (03/12/2019). FAMILY HISTORY Her family history includes Heart disease in her brother; Hyperlipidemia in her brother. SOCIAL HISTORY She  reports that she quit smoking about 54 years ago. She has never used smokeless tobacco. She reports current alcohol use of about 3.0 standard drinks of alcohol per week. She reports that she does not use drugs.    Review of Systems  Constitutional: Negative for chills, fever, malaise/fatigue and weight loss.  HENT: Negative for congestion, hearing loss, sore throat and tinnitus.   Eyes: Negative for blurred vision and double vision.  Respiratory: Negative for cough, sputum production, shortness of breath and wheezing.   Cardiovascular: Negative for chest pain, palpitations,  orthopnea, claudication and leg swelling.  Gastrointestinal: Positive for constipation. Negative for abdominal pain, blood in stool, diarrhea, heartburn, melena, nausea and vomiting.  Genitourinary: Negative.        Perineal discomfort following cystocele surgery  Musculoskeletal: Positive for back pain. Negative for falls, joint pain and myalgias.  Skin: Negative for rash.  Neurological: Negative for dizziness, tingling, sensory change, weakness and headaches.  Endo/Heme/Allergies: Negative for environmental allergies and polydipsia.  Psychiatric/Behavioral: Negative.  Negative for depression, memory loss and substance abuse. The patient is not  nervous/anxious and does not have insomnia.   All other systems reviewed and are negative.    Objective:     Today's Vitals   02/12/20 1431  BP: 126/72  Pulse: 66  Temp: (!) 97.2 F (36.2 C)  SpO2: 98%  Weight: 138 lb 12.8 oz (63 kg)   Body mass index is 24.2 kg/m.  General appearance: alert, no distress, WD/WN, female HEENT: normocephalic, sclerae anicteric, TMs pearly, nares patent, no discharge or erythema, pharynx normal Oral cavity: MMM, no lesions Neck: supple, no lymphadenopathy, no thyromegaly, no masses Heart: RRR, normal S1, S2, no murmurs Lungs: CTA bilaterally, no wheezes, rhonchi, or rales Abdomen: +bs, soft, non tender, mildly distended, no masses, no hepatomegaly, no splenomegaly Musculoskeletal: nontender, no swelling, no obvious deformity Extremities: no edema, no cyanosis, no clubbing Pulses: 2+ symmetric, upper and lower extremities, normal cap refill Neurological: alert, oriented x 3, CN2-12 intact, strength normal upper extremities and lower extremities, sensation normal throughout, DTRs 2+ throughout, no cerebellar signs, gait normal Psychiatric: normal affect, behavior normal, pleasant    Dan MakerAshley C Mete Purdum, NP   02/12/2020

## 2020-02-13 LAB — COMPLETE METABOLIC PANEL WITH GFR
AG Ratio: 2.3 (calc) (ref 1.0–2.5)
ALT: 18 U/L (ref 6–29)
AST: 21 U/L (ref 10–35)
Albumin: 4.3 g/dL (ref 3.6–5.1)
Alkaline phosphatase (APISO): 71 U/L (ref 37–153)
BUN: 13 mg/dL (ref 7–25)
CO2: 29 mmol/L (ref 20–32)
Calcium: 9.3 mg/dL (ref 8.6–10.4)
Chloride: 103 mmol/L (ref 98–110)
Creat: 0.64 mg/dL (ref 0.60–0.93)
GFR, Est African American: 100 mL/min/{1.73_m2} (ref >=60)
GFR, Est Non African American: 86 mL/min/{1.73_m2} (ref >=60)
Globulin: 1.9 g/dL (calc) (ref 1.9–3.7)
Glucose, Bld: 86 mg/dL (ref 65–99)
Potassium: 4.1 mmol/L (ref 3.5–5.3)
Sodium: 141 mmol/L (ref 135–146)
Total Bilirubin: 0.4 mg/dL (ref 0.2–1.2)
Total Protein: 6.2 g/dL (ref 6.1–8.1)

## 2020-02-13 LAB — MAGNESIUM: Magnesium: 2.2 mg/dL (ref 1.5–2.5)

## 2020-02-13 LAB — LIPID PANEL
Cholesterol: 137 mg/dL (ref <200)
HDL: 54 mg/dL (ref >=50)
LDL Cholesterol (Calc): 65 mg/dL (calc)
Non-HDL Cholesterol (Calc): 83 mg/dL (calc) (ref <130)
Total CHOL/HDL Ratio: 2.5 (calc) (ref <5.0)
Triglycerides: 93 mg/dL (ref <150)

## 2020-02-13 LAB — CBC WITH DIFFERENTIAL/PLATELET
Absolute Monocytes: 442 cells/uL (ref 200–950)
Basophils Absolute: 39 cells/uL (ref 0–200)
Basophils Relative: 0.7 %
Eosinophils Absolute: 263 cells/uL (ref 15–500)
Eosinophils Relative: 4.7 %
HCT: 37.1 % (ref 35.0–45.0)
Hemoglobin: 12.4 g/dL (ref 11.7–15.5)
Lymphs Abs: 1350 cells/uL (ref 850–3900)
MCH: 30.8 pg (ref 27.0–33.0)
MCHC: 33.4 g/dL (ref 32.0–36.0)
MCV: 92.3 fL (ref 80.0–100.0)
MPV: 9.5 fL (ref 7.5–12.5)
Monocytes Relative: 7.9 %
Neutro Abs: 3506 cells/uL (ref 1500–7800)
Neutrophils Relative %: 62.6 %
Platelets: 234 10*3/uL (ref 140–400)
RBC: 4.02 10*6/uL (ref 3.80–5.10)
RDW: 11.4 % (ref 11.0–15.0)
Total Lymphocyte: 24.1 %
WBC: 5.6 10*3/uL (ref 3.8–10.8)

## 2020-02-13 LAB — TSH: TSH: 3.23 mIU/L (ref 0.40–4.50)

## 2020-02-14 ENCOUNTER — Other Ambulatory Visit: Payer: Self-pay

## 2020-02-14 ENCOUNTER — Encounter: Payer: Self-pay | Admitting: Physical Therapy

## 2020-02-14 ENCOUNTER — Ambulatory Visit: Payer: Medicare PPO | Admitting: Physical Therapy

## 2020-02-14 DIAGNOSIS — N3946 Mixed incontinence: Secondary | ICD-10-CM | POA: Diagnosis not present

## 2020-02-14 DIAGNOSIS — R278 Other lack of coordination: Secondary | ICD-10-CM | POA: Diagnosis not present

## 2020-02-14 DIAGNOSIS — N8110 Cystocele, unspecified: Secondary | ICD-10-CM | POA: Diagnosis not present

## 2020-02-14 DIAGNOSIS — M6281 Muscle weakness (generalized): Secondary | ICD-10-CM

## 2020-02-14 NOTE — Therapy (Addendum)
[facility] [address] [place], [place], [address] Phone: [phone]   Fax:  [phone]  Physical Therapy Treatment  Patient Details  Name: Jasmine Reeves MRN: [id number] Date of Birth: 1940/04/02 Referring Provider (PT): Dr. Dr. Lorelee Market   Encounter Date: 02/14/2020   PT End of Session - 02/14/20 1535    Visit Number 4    Date for PT Re-Evaluation 03/19/20    Authorization Time Period 12/26/19-03/20/2019    Authorization - Visit Number 3    Authorization - Number of Visits 12    PT Start Time 0947    PT Stop Time 1610    PT Time Calculation (min) 40 min    Activity Tolerance Patient tolerated treatment well;No increased pain    Behavior During Therapy WFL for tasks assessed/performed           Past Medical History:  Diagnosis Date  . COPD (chronic obstructive pulmonary disease) ([place])   . Fibromyalgia   . GERD (gastroesophageal reflux disease)   . IBS (irritable bowel syndrome)   . Scoliosis     Past Surgical History:  Procedure Laterality Date  . ABDOMINAL HYSTERECTOMY    . APPENDECTOMY    . CORONARY ANGIOPLASTY  12/11  . CORONARY ARTERY BYPASS GRAFT    . CYSTOCELE REPAIR  03/12/2019   cystocele and rectocele repair, Dr. Nori Riis  . ESOPHAGOGASTRODUODENOSCOPY  07/21/12   polyps, Hiatal hernia Dr. Cristina Gong  . TONSILLECTOMY    . TUBAL LIGATION      There were no vitals filed for this visit.   Subjective Assessment - 02/14/20 1535    Subjective My husband has Parkinson and I had to help him and through my back out. I am on Predisone for my back. I have had constipation and wants me to take Miralx. Te Miralax kicked in. I have had trouble doing my exercises due to my husbands medical complications. I have done the abdominal massage.    Pertinent History A/P coloporrhaphy, sacrospinaous ligament fixation 03/12/2019; Total abdominal hysterectomy; has hearing loss and getting a hearing aide;  Degenrative scoliosis    Patient Stated Goals improve pelvic floor strength, redue urinary leakage and gas    Currently in Pain? Yes    Pain Score 5     Pain Location Abdomen    Pain Orientation Right;Lower    Pain Descriptors / Indicators Aching    Pain Type Chronic pain    Pain Onset More than a month ago    Pain Frequency Intermittent    Aggravating Factors  pain just comes on    Pain Relieving Factors not sure what helps it    Multiple Pain Sites Yes    Pain Score 9    Pain Location Back    Pain Orientation Lower    Pain Descriptors / Indicators Aching    Pain Type Acute pain    Pain Onset 1 to 4 weeks ago    Pain Frequency Constant    Aggravating Factors  sitting, reach, stand    Pain Relieving Factors rub              [facility] PT Assessment - 02/14/20 0001      Assessment   Medical Diagnosis N81.10 Prolapse of anterior vaginal wall; R10.2 Pelvic pain in female; M62.89 Pelvic floor dysfunction; R19.8 Alternating constipation and diarrhea; M39.46 Mixed  stress and urge incontinence    Referring Provider (PT) Dr. Dr. Lorelee Market    Onset Date/Surgical  Date 06/30/19    Prior Therapy no pelvic floor PT      Precautions   Precautions None      Restrictions   Weight Bearing Restrictions No      Prior Function   Level of Independence Independent    Vocation Retired      Associate Professor   Overall Cognitive Status Within Functional Limits for tasks assessed      Strength   Right Hip Flexion 4/5    Right Hip Extension 4-/5    Right Hip Internal Rotation 4/5    Right Hip ABduction 4-/5    Left Hip Flexion 4/5    Left Hip Extension 4-/5    Left Hip ABduction 4-/5    Left Hip ADduction 4/5                         [facility] Adult PT Treatment/Exercise - 02/14/20 0001      Therapeutic Activites    Therapeutic Activities Other Therapeutic Activities    Other Therapeutic Activities rolling in bed with breathing out and being a log and supine to sit with correct  body mechanics to reduce strain on back and pelvic floor      Lumbar Exercises: Stretches   Active Hamstring Stretch Right;Left;1 rep;30 seconds    Active Hamstring Stretch Limitations supine    Single Knee to Chest Stretch Right;Left;1 rep;30 seconds    Lower Trunk Rotation --   15 reps with small rock   Piriformis Stretch Right;Left;1 rep;30 seconds    Piriformis Stretch Limitations supine      Lumbar Exercises: Supine   Ab Set 5 seconds;15 reps    AB Set Limitations tactile cues to contract the lower abdominals and breath    Other Supine Lumbar Exercises sidely gente pelvic rock to improve mobility      Modalities   Modalities Moist Heat      Moist Heat Therapy   Number Minutes Moist Heat 15 Minutes    Moist Heat Location Lumbar Spine   supine     Manual Therapy   Manual Therapy Soft tissue mobilization    Soft tissue mobilization lumbar paraspinals in sidley to improve muscle mobility                    PT Short Term Goals - 02/14/20 1617      PT SHORT TERM GOAL #1   Title Ind with inital HEP    Time 4    Period Weeks    Status Achieved      PT SHORT TERM GOAL #2   Title understand what bladder irritants are and how they affect the urinary leakage    Time 4    Status Achieved      PT SHORT TERM GOAL #3   Title able to contract the lower abdominals correctly to not put pressure on the pelvic floor    Time 4    Period Weeks    Status Achieved      PT SHORT TERM GOAL #4   Title understand correct toileting to reduce strain on prolapse    Time 4    Period Weeks    Status On-going      PT SHORT TERM GOAL #5   Title understand ways to manage her prolapse to reduce strain    Period Weeks    Status On-going             PT Long Term  Goals - 02/14/20 1543      PT LONG TERM GOAL #1   Title independent with advanced HEP    Time 12    Period Weeks    Status On-going      PT LONG TERM GOAL #2   Title pelvic floor strength >/= 3/5 without  substitutions for 10 seconds to improve pelvic support    Time 12    Period Weeks    Status On-going      PT LONG TERM GOAL #3   Title understand ways to manage constipation and diarrhea with bowel health, abdominal massage, and correct breathing    Time 12    Period Weeks    Status Achieved      PT LONG TERM GOAL #4   Title urinary leakage decreased >/= 50% due to improve pelvic floor coordination and core strength    Time 12    Period Weeks    Status On-going      PT LONG TERM GOAL #5   Title ability to control her gas >/= 50% due to improve pelvic floor coordination and strength    Baseline only in sitting but not in standing    Time 12    Period Weeks    Status On-going                 Plan - 02/14/20 1548    Clinical Impression Statement Patient has been taking care of her husband who has Parkinsons. She has acute back pain from taking care of her husband. She has not been able to focus on her exercises right now due to taking care of her husband. Therapy focused on reducing back pain, improving mobility, and reducing stress on the pelvic floor. She was able to engage her lower abdominals correctly without doing the ball squeeze. Patient abdomen is slightly firm and bigger than last time due to some constipation issues she has had. Patient has not met goals today due to not able to do her exercises to take care of her husband and medical issues.    Personal Factors and Comorbidities Comorbidity 3+;Fitness    Comorbidities A/P colporrhaphy, sacrospinous ligament fixation 03/12/2019; IBS; MVP; Osteopenia; Raynauds; Total hysterectomy    Examination-Activity Limitations Continence;Toileting;Lift;Stand;Squat    Examination-Participation Restrictions Community Activity    Stability/Clinical Decision Making Evolving/Moderate complexity    Rehab Potential Good    PT Frequency 1x / week    PT Duration 12 weeks    PT Treatment/Interventions ADLs/Self Care Home  Management;Biofeedback;Electrical Stimulation;Therapeutic activities;Therapeutic exercise;Neuromuscular re-education;Manual techniques;Patient/family education;Dry needling    PT Next Visit Plan urge suppression; go over bladder irritants, , lower abdominal contraction with arm movements, review toileting, work on pelvic floor manually, diaphragmatic, discharge or renewal    PT Home Exercise Plan Access Code: [email or web address]    Recommended Other Services MD signed initial eval    Consulted and Agree with Plan of Care Patient           Patient will benefit from skilled therapeutic intervention in order to improve the following deficits and impairments:  Decreased coordination,Decreased range of motion,Increased fascial restricitons  Visit Diagnosis: Muscle weakness (generalized)  Other lack of coordination  Mixed incontinence  Prolapse of anterior vaginal wall     Problem List Patient Active Problem List   Diagnosis Date Noted  . Cystocele with rectocele 12/13/2018  . Aortic atherosclerosis ([place]) 03/06/2018  . BMI 24.0-24.9, adult 11/01/2017  . Osteopenia 04/12/2017  . OSA on CPAP 11/27/2014  .  IBS (irritable bowel syndrome) 11/11/2014  . Encounter for Medicare annual wellness exam 11/11/2014  . Medication management 04/12/2014  . Abnormal blood sugar 10/10/2013  . Mixed hyperlipidemia 10/08/2013  . CAD (coronary artery disease) 02/03/2013  . MVP (mitral valve prolapse) 02/03/2013  . Vitamin D deficiency   . GERD (gastroesophageal reflux disease)   . COPD (chronic obstructive pulmonary disease) ([place]) 02/11/2010  . Essential hypertension 01/28/2010  . [facility] SYNDROME 01/28/2010    Earlie Counts, PT 02/14/20 4:19 PM   [facility] [address], [place], [place], [address] Phone: [phone]   Fax:  [phone]  Name: Jasmine Reeves MRN: [id number] Date of Birth: October 29, 1940  PHYSICAL THERAPY DISCHARGE  SUMMARY  Visits from Start of Care: 4  Current functional level related to goals / functional outcomes: Patient called to be discharged due to having a compression fracture. Patient has not achieved her goals.    Remaining deficits: See above.    Education / Equipment: Follow MD orders due to spinal compression fracture.  Plan: Patient agrees to discharge.  Patient goals were not met. Patient is being discharged due to a change in medical status.  Thank you for the referral. Earlie Counts, PT 02/19/20 9:15 AM  ?????

## 2020-02-16 ENCOUNTER — Other Ambulatory Visit: Payer: Self-pay

## 2020-02-16 ENCOUNTER — Encounter (HOSPITAL_BASED_OUTPATIENT_CLINIC_OR_DEPARTMENT_OTHER): Payer: Self-pay | Admitting: Emergency Medicine

## 2020-02-16 ENCOUNTER — Emergency Department (HOSPITAL_BASED_OUTPATIENT_CLINIC_OR_DEPARTMENT_OTHER): Payer: Medicare PPO

## 2020-02-16 ENCOUNTER — Emergency Department (HOSPITAL_BASED_OUTPATIENT_CLINIC_OR_DEPARTMENT_OTHER)
Admission: EM | Admit: 2020-02-16 | Discharge: 2020-02-16 | Disposition: A | Discharge status: Home / Self Care | Payer: Medicare PPO | Attending: Emergency Medicine | Admitting: Emergency Medicine

## 2020-02-16 DIAGNOSIS — K8689 Other specified diseases of pancreas: Secondary | ICD-10-CM

## 2020-02-16 DIAGNOSIS — R140 Abdominal distension (gaseous): Secondary | ICD-10-CM | POA: Insufficient documentation

## 2020-02-16 DIAGNOSIS — G20 Parkinson's disease: Secondary | ICD-10-CM | POA: Diagnosis not present

## 2020-02-16 DIAGNOSIS — K921 Melena: Secondary | ICD-10-CM | POA: Diagnosis not present

## 2020-02-16 DIAGNOSIS — K449 Diaphragmatic hernia without obstruction or gangrene: Secondary | ICD-10-CM | POA: Insufficient documentation

## 2020-02-16 DIAGNOSIS — J449 Chronic obstructive pulmonary disease, unspecified: Secondary | ICD-10-CM | POA: Diagnosis not present

## 2020-02-16 DIAGNOSIS — Z87891 Personal history of nicotine dependence: Secondary | ICD-10-CM | POA: Diagnosis not present

## 2020-02-16 DIAGNOSIS — I10 Essential (primary) hypertension: Secondary | ICD-10-CM | POA: Diagnosis not present

## 2020-02-16 DIAGNOSIS — K625 Hemorrhage of anus and rectum: Secondary | ICD-10-CM | POA: Insufficient documentation

## 2020-02-16 DIAGNOSIS — M5459 Other low back pain: Secondary | ICD-10-CM | POA: Diagnosis not present

## 2020-02-16 DIAGNOSIS — I2510 Atherosclerotic heart disease of native coronary artery without angina pectoris: Secondary | ICD-10-CM | POA: Diagnosis not present

## 2020-02-16 DIAGNOSIS — M545 Low back pain, unspecified: Secondary | ICD-10-CM | POA: Insufficient documentation

## 2020-02-16 DIAGNOSIS — S22000A Wedge compression fracture of unspecified thoracic vertebra, initial encounter for closed fracture: Secondary | ICD-10-CM

## 2020-02-16 DIAGNOSIS — M549 Dorsalgia, unspecified: Secondary | ICD-10-CM

## 2020-02-16 DIAGNOSIS — K869 Disease of pancreas, unspecified: Secondary | ICD-10-CM | POA: Diagnosis not present

## 2020-02-16 LAB — CBC WITH DIFFERENTIAL/PLATELET
Abs Immature Granulocytes: 0.04 10*3/uL (ref 0.00–0.07)
Basophils Absolute: 0 10*3/uL (ref 0.0–0.1)
Basophils Relative: 0 %
Eosinophils Absolute: 0 10*3/uL (ref 0.0–0.5)
Eosinophils Relative: 0 %
HCT: 36.6 % (ref 36.0–46.0)
Hemoglobin: 12.3 g/dL (ref 12.0–15.0)
Immature Granulocytes: 0 %
Lymphocytes Relative: 26 %
Lymphs Abs: 2.5 10*3/uL (ref 0.7–4.0)
MCH: 31.2 pg (ref 26.0–34.0)
MCHC: 33.6 g/dL (ref 30.0–36.0)
MCV: 92.9 fL (ref 80.0–100.0)
Monocytes Absolute: 1 10*3/uL (ref 0.1–1.0)
Monocytes Relative: 11 %
Neutro Abs: 5.8 10*3/uL (ref 1.7–7.7)
Neutrophils Relative %: 63 %
Platelets: 242 10*3/uL (ref 150–400)
RBC: 3.94 MIL/uL (ref 3.87–5.11)
RDW: 12.6 % (ref 11.5–15.5)
WBC: 9.4 10*3/uL (ref 4.0–10.5)
nRBC: 0 % (ref 0.0–0.2)

## 2020-02-16 LAB — COMPREHENSIVE METABOLIC PANEL
ALT: 42 U/L (ref 0–44)
AST: 28 U/L (ref 15–41)
Albumin: 3.9 g/dL (ref 3.5–5.0)
Alkaline Phosphatase: 64 U/L (ref 38–126)
Anion gap: 8 (ref 5–15)
BUN: 26 mg/dL — ABNORMAL HIGH (ref 8–23)
CO2: 28 mmol/L (ref 22–32)
Calcium: 8.5 mg/dL — ABNORMAL LOW (ref 8.9–10.3)
Chloride: 102 mmol/L (ref 98–111)
Creatinine, Ser: 0.58 mg/dL (ref 0.44–1.00)
GFR, Estimated: >60 mL/min (ref >60)
Glucose, Bld: 99 mg/dL (ref 70–99)
Potassium: 3.2 mmol/L — ABNORMAL LOW (ref 3.5–5.1)
Sodium: 138 mmol/L (ref 135–145)
Total Bilirubin: 0.4 mg/dL (ref 0.3–1.2)
Total Protein: 6.2 g/dL — ABNORMAL LOW (ref 6.5–8.1)

## 2020-02-16 MED ORDER — DIAZEPAM 2 MG PO TABS
2.0000 mg | ORAL_TABLET | Freq: Once | ORAL | Status: AC
  Administered 2020-02-16: 2 mg via ORAL
  Filled 2020-02-16: qty 1

## 2020-02-16 MED ORDER — FENTANYL CITRATE (PF) 100 MCG/2ML IJ SOLN
50.0000 ug | Freq: Once | INTRAMUSCULAR | Status: AC
  Administered 2020-02-16: 50 ug via INTRAVENOUS
  Filled 2020-02-16: qty 2

## 2020-02-16 MED ORDER — OXYCODONE-ACETAMINOPHEN 5-325 MG PO TABS: 1.0000 | ORAL_TABLET | ORAL | 0 refills | Status: DC | PRN

## 2020-02-16 MED ORDER — OXYCODONE-ACETAMINOPHEN 5-325 MG PO TABS
1.0000 | ORAL_TABLET | Freq: Once | ORAL | Status: AC
  Administered 2020-02-16: 1 via ORAL
  Filled 2020-02-16: qty 1

## 2020-02-16 MED ORDER — IOHEXOL 300 MG/ML  SOLN
100.0000 mL | Freq: Once | INTRAMUSCULAR | Status: AC | PRN
  Administered 2020-02-16: 100 mL via INTRAVENOUS

## 2020-02-16 MED ORDER — KETOROLAC TROMETHAMINE 30 MG/ML IJ SOLN
15.0000 mg | Freq: Once | INTRAMUSCULAR | Status: AC
  Administered 2020-02-16: 15 mg via INTRAVENOUS
  Filled 2020-02-16: qty 1

## 2020-02-16 MED ORDER — DOCUSATE SODIUM 100 MG PO CAPS: 100.0000 mg | ORAL_CAPSULE | Freq: Two times a day (BID) | ORAL | 0 refills | Status: DC

## 2020-02-16 NOTE — ED Notes (Signed)
Tech is in with pt for brace fitting

## 2020-02-16 NOTE — ED Notes (Signed)
ED Provider at bedside. 

## 2020-02-16 NOTE — ED Notes (Signed)
Called Ortho tech 3 times today the call person said he called them but there was else he could do. Last called was at 3:41

## 2020-02-16 NOTE — ED Triage Notes (Signed)
Low back pain x 2 weeks. She cares for her husband and thinks she pulled something while caring for him. She saw her PCP and was started on prednisone but symptoms persist. Also reports bright red rectal bleeding. She has been taking miralax due to constipation.

## 2020-02-16 NOTE — ED Notes (Signed)
Ortho tech called to bring brace and apply to pt; pt/daughter updated on process.

## 2020-02-16 NOTE — Progress Notes (Signed)
Orthopedic Tech Progress Note Patient Details:  Jasmine Reeves 1940/06/05 [id number] Ordered brace Patient ID: [name], female   DOB: [birth year], 79 y.o.   MRN: [id number]   Michelle Piper 02/16/2020, 1:46 PM

## 2020-02-16 NOTE — ED Notes (Signed)
Patient transported to CT 

## 2020-02-16 NOTE — ED Notes (Signed)
Pt ambulatory to restroom, endorses pain increase when ambulating

## 2020-02-16 NOTE — ED Notes (Signed)
Chaperone for EDP for rectal exam 

## 2020-02-16 NOTE — ED Provider Notes (Signed)
Emergency Department Provider Note  I have reviewed the triage vital signs and the nursing notes.  HISTORY  Chief Complaint Back Pain and Rectal Bleeding   HPI Jasmine Reeves is a 79 y.o. female with multiple medical problems as documented below who presents to the emergency department today with multiple complaints.  It appears that the main reason that she came in today was because she has been having some back pain.  She states there has been has Parkinson's and had a issue a couple weeks ago which required significant assistance from her and ever since that time she is been having left lower back pain.  She states it feels like when she had strains in the past however she went to see her doctor and got started on steroids and it did not make any better which concerns her.  Patient states that she is not having any new bowel or bladder incontinence.  She does have some mild incontinence that been going on for years.  She has no paresthesias or weakness in her legs.  She states she takes aspirin but no blood thinners.  No IV drug use.  No recent trauma.  She also states that she has noticed some bright red blood on her toilet paper recently.  She states that she has had bleeding this with hemorrhoids in the past but is worried that maybe this is related to the back pain.   Does not endorse significant changes in her urine however does state that she has some suprapubic discomfort.  She does note this is her recurrent cystocele or something else.  She does have some mild distention.  May have been constipated recently but is on a MiraLAX regimen.  Is not constipated now.  No other associated or modifying symptoms.    Past Medical History:  Diagnosis Date  . COPD (chronic obstructive pulmonary disease) (HCC)   . Fibromyalgia   . GERD (gastroesophageal reflux disease)   . IBS (irritable bowel syndrome)   . Scoliosis     Patient Active Problem List   Diagnosis Date Noted  . Cystocele  with rectocele 12/13/2018  . Aortic atherosclerosis (HCC) 03/06/2018  . BMI 24.0-24.9, adult 11/01/2017  . Osteopenia 04/12/2017  . OSA on CPAP 11/27/2014  . IBS (irritable bowel syndrome) 11/11/2014  . Encounter for Medicare annual wellness exam 11/11/2014  . Medication management 04/12/2014  . Abnormal blood sugar 10/10/2013  . Mixed hyperlipidemia 10/08/2013  . CAD (coronary artery disease) 02/03/2013  . MVP (mitral valve prolapse) 02/03/2013  . Vitamin D deficiency   . GERD (gastroesophageal reflux disease)   . COPD (chronic obstructive pulmonary disease) (HCC) 02/11/2010  . Essential hypertension 01/28/2010  . RAYNAUDS SYNDROME 01/28/2010    Past Surgical History:  Procedure Laterality Date  . ABDOMINAL HYSTERECTOMY    . APPENDECTOMY    . CORONARY ANGIOPLASTY  12/11  . CORONARY ARTERY BYPASS GRAFT    . CYSTOCELE REPAIR  03/12/2019   cystocele and rectocele repair, Dr. Jennette Kettle  . ESOPHAGOGASTRODUODENOSCOPY  07/21/12   polyps, Hiatal hernia Dr. Matthias Hughs  . TONSILLECTOMY    . TUBAL LIGATION      Current Outpatient Rx  . Order #: [id number] Class: Historical Med  . Order #: [id number] Class: Historical Med  . Order #: [id number] Class: Historical Med  . Order #: [id number] Class: Normal  . Order #: [id number] Class: Normal  . Order #: [id number] Class: Historical Med  . Order #: [id number] Class: Normal  . Order #: [id number] Class: Historical Med  . Order #:  [id number] Class: Normal  . Order #: [id number] Class: Historical Med  . Order #: [id number] Class: Normal  . Order #: [id number] Class: Normal  . Order #: [id number] Class: Normal  . Order #: [id number] Class: Historical Med  . Order #: [id number] Class: Normal  . Order #: [id number] Class: Normal  . Order #: [id number] Class: Normal  . Order #: [id number] Class: Normal  . Order #: [id number] Class: Normal  . Order #: [id number] Class: Historical Med  . Order #: [id number] Class: Historical Med    Allergies Altace [ramipril], Azithromycin, Celebrex  [celecoxib], Hydrochlorothiazide, Ppd [tuberculin purified protein derivative], and Prilosec [omeprazole]  Family History  Problem Relation Age of Onset  . Heart disease Brother   . Hyperlipidemia Brother     Social History Social History   Tobacco Use  . Smoking status: Former Smoker    Quit date: 09/03/1965    Years since quitting: 54.4  . Smokeless tobacco: Never Used  Substance Use Topics  . Alcohol use: Yes    Alcohol/week: 3.0 standard drinks    Types: 3 Standard drinks or equivalent per week    Comment: rare  . Drug use: No    Review of Systems  All other systems negative except as documented in the HPI. All pertinent positives and negatives as reviewed in the HPI. ____________________________________________  PHYSICAL EXAM:  VITAL SIGNS: ED Triage Vitals  Enc Vitals Group     BP 02/16/20 0713 (!) 163/92     Pulse Rate 02/16/20 0713 74     Resp 02/16/20 0713 18     Temp 02/16/20 0713 98.5 F (36.9 C)     Temp Source 02/16/20 0713 Oral     SpO2 02/16/20 0713 98 %     Weight 02/16/20 0710 138 lb (62.6 kg)     Height 02/16/20 0710 5\' 4"  (1.626 m)    Constitutional: Alert and oriented. Well appearing and in no acute distress. Eyes: Conjunctivae are normal. PERRL. EOMI. Head: Atraumatic. Nose: No congestion/rhinnorhea. Mouth/Throat: Mucous membranes are moist.  Oropharynx non-erythematous. Neck: No stridor.  No meningeal signs.   Cardiovascular: Normal rate, regular rhythm. Good peripheral circulation. Grossly normal heart sounds.   Respiratory: Normal respiratory effort.  No retractions. Lungs CTAB. Gastrointestinal: Soft and nontender. No distention.  Musculoskeletal: No lower extremity tenderness nor edema. No gross deformities of extremities. Neurologic:  Normal speech and language. No gross focal neurologic deficits are appreciated.  Skin:  Skin is warm, dry and intact. No rash noted. GU: hemmorhoid with sequelae of recent bleeding.   ____________________________________________   LABS (all labs ordered are listed, but only abnormal results are displayed)  Labs Reviewed  CBC WITH DIFFERENTIAL/PLATELET  COMPREHENSIVE METABOLIC PANEL   ____________________________________________  EKG   EKG Interpretation  Date/Time:    Ventricular Rate:    PR Interval:    QRS Duration:   QT Interval:    QTC Calculation:   R Axis:     Text Interpretation:         ____________________________________________  RADIOLOGY  No results found. ____________________________________________  PROCEDURES  Procedure(s) performed:   Procedures ____________________________________________  INITIAL IMPRESSION / ASSESSMENT AND PLAN    This patient presents to the ED for concern of back pain, rectal bleeding, this involves an extensive number of treatment options, and is a complaint that carries with it a high risk of complications and morbidity.  The differential diagnosis includes bowel obstruction, kidney stone, muscle spasm, vertebral fracture  Additional history obtained:   Additional history obtained from noone  Previous records obtained and reviewed in epic  ED Course      Medicines ordered:  Medications  diazepam (VALIUM) tablet 2 mg (has no administration in time range)  fentaNYL (SUBLIMAZE) injection 50 mcg (has no administration in time range)     Consultations Obtained:   I consulted noone  and discussed lab and imaging findings   CRITICAL INTERVENTIONS:  . none  Reevaluation:  After the interventions stated above, I reevaluated the patient and found vertevral fracture. Ordered TLSO. Pain improved with narcotics. Will dc w/ outpatient follow up.  FINAL IMPRESSION AND PLAN Final diagnoses:  Back pain   A medical screening exam was performed and I feel the patient has had an appropriate workup for their chief complaint at this time and likelihood of emergent condition existing is low. They  have been counseled on decision, DISCHARGE, follow up and which symptoms necessitate immediate return to the emergency department. They or their family verbally stated understanding and agreement with plan and discharged in stable condition.   ____________________________________________   NEW OUTPATIENT MEDICATIONS STARTED DURING THIS VISIT:  New Prescriptions   No medications on file    Note:  This note was prepared with assistance of Dragon voice recognition software. Occasional wrong-word or sound-a-like substitutions may have occurred due to the inherent limitations of voice recognition software.   Zakariye Nee, Barbara Cower, MD 02/18/20 [phone]

## 2020-02-17 ENCOUNTER — Other Ambulatory Visit: Payer: Self-pay | Admitting: Cardiovascular Disease

## 2020-02-18 ENCOUNTER — Telehealth: Payer: Self-pay | Admitting: *Deleted

## 2020-02-18 ENCOUNTER — Other Ambulatory Visit: Payer: Self-pay | Admitting: Internal Medicine

## 2020-02-18 DIAGNOSIS — K862 Cyst of pancreas: Secondary | ICD-10-CM

## 2020-02-18 NOTE — Telephone Encounter (Signed)
Called patient and informed her she will need a face to face OV inregard to her request for an electric hospital bed, due to a recent T12 compression fracture. MRI of spine can be discussed at the OV. Per Dr Oneta Rack, an MRI of abdomen will be ordered for lesions seen on pancreas during recent CT scan.

## 2020-02-19 ENCOUNTER — Telehealth: Payer: Self-pay | Admitting: Internal Medicine

## 2020-02-19 DIAGNOSIS — Z20822 Contact with and (suspected) exposure to covid-19: Secondary | ICD-10-CM | POA: Diagnosis not present

## 2020-02-19 DIAGNOSIS — K862 Cyst of pancreas: Secondary | ICD-10-CM | POA: Diagnosis not present

## 2020-02-19 DIAGNOSIS — M5136 Other intervertebral disc degeneration, lumbar region: Secondary | ICD-10-CM | POA: Diagnosis not present

## 2020-02-19 NOTE — Telephone Encounter (Signed)
patient called PCP office to notify this referral coordinator;  that she and her son have decided to seek the care of Dr. Whitney Post, [facility] surgical oncologist for her pancreatitic cyst. She advised that she has been scheduled at [facility] radiology for a MRI Abdomen 02/26/20 by Dr Glennis Brink . Per Dr. [name], PCP, I have canceled the scheduled MRI Abdomen at [facility], and canceled the insurance authorization as Dr Oneta Rack is ordering provider of record. This will allow Dr. Jamesetta Geralds  be the ordering provider of record, and to insure no delays in her team acquiring an insurance authorization.  We have notified the patient and encouraged the patient to please call us with any questions or concerns as her PCP.

## 2020-02-20 ENCOUNTER — Encounter: Payer: Medicare PPO | Admitting: Physical Therapy

## 2020-02-21 DIAGNOSIS — S22080A Wedge compression fracture of T11-T12 vertebra, initial encounter for closed fracture: Secondary | ICD-10-CM | POA: Insufficient documentation

## 2020-02-21 NOTE — Progress Notes (Signed)
Assessment and Plan:  Jasmine Reeves was seen today for back pain.  Diagnoses and all orders for this visit:  Compression fracture of T12 vertebra, initial encounter (HCC) T12 with ~30% compression per CT, wearing brace She does appear to be managing with pain fairly well with ibuprofen, bracing and PRN percocet; after discussion will transition to ultram to use sparingly, recommend alternating tylenol/NSAID, discussed topical options, may also try salon pas at night Discussed if pain not improving, may consider calcitonin, if severe may consider specialist evaluation for vertebroplasty/kyphoplasty; patient family is strongly requesting proceed with specialist referral at this time Will also refer for home health PT to discuss appropriate transferring techniques, evaluation for need of transfer devices;  -     traMADol (ULTRAM) 50 MG tablet; Take 1/2-1 tab every 4 hours as needed for severe pain. Use tylenol/ibuprofen for mild-mod pain. -     Ambulatory referral to Home Health -     Ambulatory referral to Neurosurgery  Osteoporosis with current pathological fracture  Recommended follow up with Dr. Jennette Kettle for repeat DEXA in light of compression fracture without significant injury Continue calcium and vitamin D; resume weight bearing exercises once fracture is improved Plan to initiate bisphosphonate unless intolerance or contraindication becomes apparent   Hypokalemia -     BASIC METABOLIC PANEL WITH GFR  Pancreas lesion Dr. Whitney Post, [facility] surgical oncologist and has been scheduled at [facility] radiology for a MRI Abdomen 02/26/20 - requested forward report/will review in care everwhere if possible  Further disposition pending results of labs. Discussed med's effects and SE's.   Over 30 minutes of exam, counseling, chart review, and critical decision making was performed.   Future Appointments  Date Time Provider Department Center  03/04/2020  2:20 PM Lennette Bihari, MD [facility]   05/12/2020  2:30 PM [name], MD [facility] None  08/14/2020  2:00 PM Judd Gaudier, NP [facility] None  11/17/2020 11:00 AM [name], MD [facility] None    ------------------------------------------------------------------------------------------------------------------   HPI 79 y.o.female presents for ED follow up for T12 compression fracture and pancreatic lesions.   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 She was prescribed percocet 20 tabs at discharge.   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 Whitney Post, [facility] surgical oncologist and has been scheduled at [facility] radiology for a MRI Abdomen 02/26/20.   She is here with her son today, reports have moved both Mr. And Mrs. Bvaynes into assisted living [facility] He daughter is ICU nurse and apparently wonders if back MRI and hospital bed could be ordered.   She reports moderate intermittent pain, occasionally midline but more commonly bilaterally. She reports has been taking percocet 5/325 mg in AM and PM prior to sleep, trying to manage with ibuprofen 600 mg 2-3 times during the day. She is wearing a brace that was provided in ED while out of bed. Son is concerned about immobility and risk of constipation with percocet.   She reports has been taking 100 mg BID  and reports bowels are moving well at this time. She does report pain is worst with awakening in AM, seems to  improve later in the day. She reports has had some lose stools/leakage.   Last DEXA 12/15/2018 by Dr. Jennette Kettle showed osteoporosis of left forearm T-2.8, otherwise in osteopenia range   CT abd 02/16/2020.   IMPRESSION: 1. Mild inferior T12 vertebral compression fracture, new since 07/21/2016 CT, possibly acute. 2. Large right colonic stool volume, suggesting constipation. No evidence of bowel obstruction or acute bowel inflammation. Mild sigmoid diverticulosis, with no evidence of acute diverticulitis. 3. Two pancreatic tail cystic lesions, largest 1.5 cm, not definitely seen on prior noncontrast CT. Recommend further characterization with MRI abdomen without and with IV contrast on a short term outpatient basis. 4. Small to moderate hiatal hernia. 5. Small cystocele. 6. Aortic Atherosclerosis (ICD10-I70.0).    IMPRESSION: Acute compression fracture of T12 with vertebral body height loss of up to approximately 30%. Mild retropulsion off the superior endplate of T12 causes minimal narrowing in the left lateral recess at T11-12. There is also minimal retropulsion off the inferior endplate of T12 without resultant stenosis.   Component     Latest Ref Rng & Units 02/01/2019 05/14/2019 10/30/2019 02/12/2020  Calcium     8.9 - 10.3 mg/dL 9.2 9.4 9.6 9.3   Component     Latest Ref Rng & Units 02/16/2020  Calcium     8.9 - 10.3 mg/dL 8.5 (L)    Lab Results  Component Value Date   WBC 9.4 02/16/2020   HGB 12.3 02/16/2020   HCT 36.6 02/16/2020   MCV 92.9 02/16/2020   PLT 242 02/16/2020   Lab Results  Component Value Date   ALT 42 02/16/2020   AST 28 02/16/2020   ALKPHOS 64 02/16/2020   BILITOT 0.4 02/16/2020   No hx of calcium abnormalities on review of last year; had acute low at ED check;  Lab Results  Component Value Date   NA 138 02/16/2020   K 3.2 (L) 02/16/2020   CL 102 02/16/2020   CO2 28 02/16/2020   GLUCOSE 99 02/16/2020   BUN 26 (H) 02/16/2020   CREATININE  0.58 02/16/2020   CALCIUM 8.5 (L) 02/16/2020   GFRAA 100 02/12/2020   GFRNONAA >60 02/16/2020   Lab Results  Component Value Date   TSH 3.23 02/12/2020      Past Medical History:  Diagnosis Date  . COPD (chronic obstructive pulmonary disease) (HCC)   . Fibromyalgia   . GERD (gastroesophageal reflux disease)   . IBS (irritable bowel syndrome)   . Scoliosis      Allergies  Allergen Reactions  . Altace [Ramipril]     cough  . Azithromycin     palpitation  . Celebrex [Celecoxib]     Gi upset   . Hydrochlorothiazide     cramping  . 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 Prilosec [Omeprazole]     constipation    Current Outpatient Medications on File Prior to Visit  Medication Sig  . aspirin 81 MG tablet Take 81 mg by mouth daily.  Marland Kitchen CALCIUM CITRATE PO Take 500 mg by mouth daily.  . Cholecalciferol (VITAMIN D) 2000 UNITS tablet Take 2,000 Units by mouth 2 (two) times daily.  Marland Kitchen diltiazem (DILT-XR) 180 MG 24 hr capsule TAKE ONE CAPSULE BY MOUTH DAILY  . docusate sodium (COLACE) 100 MG capsule Take 1 capsule (100 mg total) by mouth every  12 (twelve) hours.  Marland Kitchen ezetimibe-simvastatin (VYTORIN) 10-20 MG tablet TAKE 1 TABLET BY MOUTH DAILY FOR CHOLESTEROL  . fexofenadine (ALLEGRA) 180 MG tablet Take 180 mg by mouth daily.  . fluticasone (VERAMYST) 27.5 MCG/SPRAY nasal spray Place 2 sprays into the nose daily as needed for rhinitis.  . IRON PO Take 50 mg by mouth daily.  Marland Kitchen labetalol (NORMODYNE) 100 MG tablet TAKE 1 TABLET(100 MG) BY MOUTH AT BEDTIME  . Magnesium 400 MG CAPS Take by mouth daily.  . meloxicam (MOBIC) 15 MG tablet Take 1/2 to 1 tablet Daily with Food for Pain & Inflammation  . methocarbamol (ROBAXIN) 500 MG tablet Take 1 tablet (500 mg total) by mouth every 8 (eight) hours as needed. For muscle spasms. Do not take and drive.  . mirabegron ER (MYRBETRIQ) 50 MG TB24 tablet Take      1 tablet       Daily       for Bladder  . Multiple  Vitamins-Minerals (MULTIVITAMIN WITH MINERALS) tablet Take 1 tablet by mouth daily.  . nitroGLYCERIN (NITROSTAT) 0.4 MG SL tablet Place 1 tablet (0.4 mg total) under the tongue every 5 (five) minutes as needed for chest pain.  . pantoprazole (PROTONIX) 40 MG tablet Take 1 tablet 2 x /day for Acid Indigestion & Reflux  . predniSONE (DELTASONE) 20 MG tablet 2 tablets daily for 3 days, 1 tablet daily for 4 days.  . sucralfate (CARAFATE) 1 g tablet TAKE 1 TABLET(1 GRAM) BY MOUTH FOUR TIMES DAILY  . triamcinolone ointment (KENALOG) 0.1 % Apply 1 application topically 2 (two) times daily. (Patient taking differently: Apply 1 application topically as needed.)  . vitamin C (ASCORBIC ACID) 500 MG tablet Take 500 mg by mouth daily.  . Zinc 50 MG TABS Take by mouth daily.   No current facility-administered medications on file prior to visit.    ROS: all negative except above.   Physical Exam:  BP 122/76   Pulse 68   Temp (!) 97.2 F (36.2 C)   Resp 16   Wt 140 lb (63.5 kg)   SpO2 96%   BMI 24.03 kg/m   General Appearance: Well nourished, well dressed elderly female, appearing younger than stated age, appears uncomfortable but in no acute distress. Eyes: PERRLA, conjunctiva no swelling or erythema ENT/Mouth: Mask in place; Hearing normal.  Neck: Supple Respiratory: Respiratory effort normal, BS equal bilaterally without rales, rhonchi, wheezing or stridor.  Cardio: RRR with no MRGs. Brisk peripheral pulses without edema.  Abdomen: Soft, + BS.  Non tender. Limited exam due to back pain/fracture.  Lymphatics: Non tender without lymphadenopathy.  Musculoskeletal: Slow sitting to standing; spinous process non-tender, no paraspinal spasm.  Skin: Warm, dry without rashes, lesions, ecchymosis.  Neuro: Cranial nerves intact. Normal muscle tone, no cerebellar symptoms. Sensation intact.  Psych: Awake and oriented X 3, normal affect, Insight and Judgment appropriate.     Dan Maker, NP 6:49  PM [facility]

## 2020-02-25 ENCOUNTER — Ambulatory Visit: Payer: Medicare PPO | Admitting: Adult Health

## 2020-02-25 ENCOUNTER — Other Ambulatory Visit: Payer: Self-pay

## 2020-02-25 VITALS — BP 122/76 | HR 68 | Temp 97.2°F | Resp 16 | Wt 140.0 lb

## 2020-02-25 DIAGNOSIS — E876 Hypokalemia: Secondary | ICD-10-CM | POA: Diagnosis not present

## 2020-02-25 DIAGNOSIS — M8000XA Age-related osteoporosis with current pathological fracture, unspecified site, initial encounter for fracture: Secondary | ICD-10-CM

## 2020-02-25 DIAGNOSIS — S22080A Wedge compression fracture of T11-T12 vertebra, initial encounter for closed fracture: Secondary | ICD-10-CM

## 2020-02-25 LAB — BASIC METABOLIC PANEL WITH GFR
BUN: 17 mg/dL (ref 7–25)
CO2: 28 mmol/L (ref 20–32)
Calcium: 9.5 mg/dL (ref 8.6–10.4)
Chloride: 103 mmol/L (ref 98–110)
Creat: 0.69 mg/dL (ref 0.60–0.93)
GFR, Est African American: 97 mL/min/{1.73_m2} (ref >=60)
GFR, Est Non African American: 84 mL/min/{1.73_m2} (ref >=60)
Glucose, Bld: 80 mg/dL (ref 65–99)
Potassium: 3.8 mmol/L (ref 3.5–5.3)
Sodium: 141 mmol/L (ref 135–146)

## 2020-02-25 MED ORDER — TRAMADOL HCL 50 MG PO TABS: ORAL_TABLET | ORAL | 0 refills | Status: DC

## 2020-02-25 NOTE — Patient Instructions (Signed)
Try tylenol 650 mg every 6 hours OR 1000 mg every 3 hours  Can alternate with ibuprofen   Please reach out to Dr. Verlon Au office to schedule a bone density - let them know you had a compression fracture       Spinal Compression Fracture  A spinal compression fracture is a collapse of the bones that form the spine (vertebrae). With this type of fracture, the vertebrae become pushed (compressed) into a wedge shape. Most compression fractures happen in the middle or lower part of the spine. What are the causes? This condition may be caused by:  Thinning and loss of density in the bones (osteoporosis). This is the most common cause.  A fall.  A car or motorcycle accident.  Cancer.  Trauma, such as a heavy, direct hit to the head or back. What increases the risk? You are more likely to develop this condition if:  You are 40 years or older.  You have osteoporosis.  You have certain types of cancer, including: ? Multiple myeloma. ? Lymphoma. ? Prostate cancer. ? Lung cancer. ? Breast cancer. What are the signs or symptoms? Symptoms of this condition include:  Severe pain.  Pain that gets worse over time.  Pain that is worse when you stand, walk, sit, or bend.  Sudden pain that is so bad that it is hard for you to move.  Bending or humping of the spine.  Gradual loss of height.  Numbness, tingling, or weakness in the back and legs.  Trouble walking. Your symptoms will depend on the cause of the fracture and how quickly it develops. How is this diagnosed? This condition may be diagnosed based on symptoms, medical history, and a physical exam. During the physical exam, your health care provider may tap along the length of your spine to check for tenderness. Tests may be done to confirm the diagnosis. They may include:  A bone mineral density test to check for osteoporosis.  Imaging tests, such as a spine X-ray, CT scan, or MRI. How is this treated? Treatment for  this condition depends on the cause and severity of the condition. Some fractures may heal on their own with supportive care. Treatment may include:  Pain medicine.  Rest.  A back brace.  Physical therapy exercises.  Medicine to strengthen bone.  Calcium and vitamin D supplements. Fractures that cause the back to become misshapen, cause nerve pain or weakness, or do not respond to other treatment may be treated with surgery. This may include:  Vertebroplasty. Bone cement is injected into the collapsed vertebrae to stabilize them.  Balloon kyphoplasty. The collapsed vertebrae are expanded with a balloon and then bone cement is injected into them.  Spinal fusion. The collapsed vertebrae are connected (fused) to normal vertebrae. Follow these instructions at home: Medicines  Take over-the-counter and prescription medicines only as told by your health care provider.  Do not drive or operate heavy machinery while taking prescription pain medicine.  If you are taking prescription pain medicine, take actions to prevent or treat constipation. Your health care provider may recommend that you: ? Drink enough fluid to keep your urine pale yellow. ? Eat foods that are high in fiber, such as fresh fruits and vegetables, whole grains, and beans. ? Limit foods that are high in fat and processed sugars, such as fried or sweet foods. ? Take an over-the-counter or prescription medicine for constipation. If you have a brace:  Wear the brace as told by your health care  provider. Remove it only as told by your health care provider.  Loosen the brace if your fingers or toes tingle, become numb, or turn cold and blue.  Keep the brace clean.  If the brace is not waterproof: ? Do not let it get wet. ? Cover it with a watertight covering when you take a bath or a shower. Managing pain, stiffness, and swelling   If directed, apply ice to the injured area: ? If you have a removable brace, remove  it as told by your health care provider. ? Put ice in a plastic bag. ? Place a towel between your skin and the bag. ? Leave the ice on for 30 minutes every two hours at first. Then apply the ice as needed. Activity  Rest as told by your health care provider. ? Avoid sitting for a long time without moving. Get up to take short walks every 1-2 hours. This is important to improve blood flow and breathing. Ask for help if you feel weak or unsteady.  Return to your normal activities as directed by your health care provider. Ask what activities are safe for you.  Do exercises to improve motion and strength in your back (physical therapy), as recommended by your health care provider.  Exercise regularly as directed by your health care provider. General instructions   Do not drink alcohol. Alcohol can interfere with your treatment.  Do not use any products that contain nicotine or tobacco, such as cigarettes and e-cigarettes. These can delay bone healing. If you need help quitting, ask your health care provider.  Keep all follow-up visits as told by your health care provider. This is important. It can help to prevent permanent injury, disability, and long-lasting (chronic) pain. Contact a health care provider if:  You have a fever.  You develop a cough that makes your pain worse.  Your pain medicine is not helping.  Your pain does not get better over time.  You cannot return to your normal activities as planned or expected. Get help right away if:  Your pain is very bad and it suddenly gets worse.  You are unable to move any body part (paralysis) that is below the level of your injury.  You have numbness, tingling, or weakness in any body part that is below the level of your injury.  You cannot control your bladder or bowels. Summary  A spinal compression fracture is a collapse of the bones that form the spine (vertebrae).  With this type of fracture, the vertebrae become pushed  (compressed) into a wedge shape.  Your symptoms and treatment will depend on the cause and severity of the fracture and how quickly it develops.  Some fractures may heal on their own with supportive care. Fractures that cause the back to become misshapen, cause nerve pain or weakness, or do not respond to other treatment may be treated with surgery. This information is not intended to replace advice given to you by your health care provider. Make sure you discuss any questions you have with your health care provider. Document Revised: 04/13/2018 Document Reviewed: 03/29/2017 Elsevier Patient Education  [place].      Tramadol tablets What is this medicine? TRAMADOL (TRA ma dole) is a pain reliever. It is used to treat moderate to severe pain in adults. This medicine may be used for other purposes; ask your health care provider or pharmacist if you have questions. COMMON BRAND NAME(S): Ultram What should I tell my health care provider  before I take this medicine? They need to know if you have any of these conditions:  brain tumor  depression  drug abuse or addiction  head injury  if you frequently drink alcohol containing drinks  kidney disease or trouble passing urine  liver disease  lung disease, asthma, or breathing problems  seizures or epilepsy  suicidal thoughts, plans, or attempt; a previous suicide attempt by you or a family member  an unusual or allergic reaction to tramadol, codeine, other medicines, foods, dyes, or preservatives  pregnant or trying to get pregnant  breast-feeding How should I use this medicine? Take this medicine by mouth with a full glass of water. Follow the directions on the prescription label. You can take it with or without food. If it upsets your stomach, take it with food. Do not take your medicine more often than directed. A special MedGuide will be given to you by the pharmacist with each prescription and refill. Be sure to  read this information carefully each time. Talk to your pediatrician regarding the use of this medicine in children. Special care may be needed. Overdosage: If you think you have taken too much of this medicine contact a poison control center or emergency room at once. NOTE: This medicine is only for you. Do not share this medicine with others. What if I miss a dose? If you miss a dose, take it as soon as you can. If it is almost time for your next dose, take only that dose. Do not take double or extra doses. What may interact with this medicine? Do not take this medication with any of the following medicines:  MAOIs like Carbex, Eldepryl, Marplan, Nardil, and Parnate This medicine may also interact with the following medications:  alcohol  antihistamines for allergy, cough and cold  certain medicines for anxiety or sleep  certain medicines for depression like amitriptyline, fluoxetine, sertraline  certain medicines for migraine headache like almotriptan, eletriptan, frovatriptan, naratriptan, rizatriptan, sumatriptan, zolmitriptan  certain medicines for seizures like carbamazepine, oxcarbazepine, phenobarbital, primidone  certain medicines that treat or prevent blood clots like warfarin  digoxin  furazolidone  general anesthetics like halothane, isoflurane, methoxyflurane, propofol  linezolid  local anesthetics like lidocaine, pramoxine, tetracaine  medicines that relax muscles for surgery  other narcotic medicines for pain or cough  phenothiazines like chlorpromazine, mesoridazine, prochlorperazine, thioridazine  procarbazine This list may not describe all possible interactions. Give your health care provider a list of all the medicines, herbs, non-prescription drugs, or dietary supplements you use. Also tell them if you smoke, drink alcohol, or use illegal drugs. Some items may interact with your medicine. What should I watch for while using this medicine? Tell your  health care provider if your pain does not go away, if it gets worse, or if you have new or a different type of pain. You may develop tolerance to this drug. Tolerance means that you will need a higher dose of the drug for pain relief. Tolerance is normal and is expected if you take this drug for a long time. Do not suddenly stop taking your drug because you may develop a severe reaction. Your body becomes used to the drug. This does NOT mean you are addicted. Addiction is a behavior related to getting and using a drug for a nonmedical reason. If you have pain, you have a medical reason to take pain drug. Your health care provider will tell you how much drug to take. If your health care provider wants you  to stop the drug, the dose will be slowly lowered over time to avoid any side effects. If you take other drugs that also cause drowsiness like other narcotic pain drugs, benzodiazepines, or other drugs for sleep, you may have more side effects. Give your health care provider a list of all drugs you use. He or she will tell you how much drug to take. Do not take more drug than directed. Get emergency help right away if you have trouble breathing or are unusually tired or sleepy. Talk to your health care provider about naloxone and how to get it. Naloxone is an emergency drug used for an opioid overdose. An overdose can happen if you take too much opioid. It can also happen if an opioid is taken with some other drugs or substances, like alcohol. Know the symptoms of an overdose, like trouble breathing, unusually tired or sleepy, or not being able to respond or wake up. Make sure to tell caregivers and close contacts where it is stored. Make sure they know how to use it. After naloxone is given, you must get emergency help right away. Naloxone is a temporary treatment. Repeat doses may be needed. This drug may cause serious skin reactions. They can happen weeks to months after starting the drug. Contact your  health care provider right away if you notice fevers or flu-like symptoms with a rash. The rash may be red or purple and then turn into blisters or peeling of the skin. Or, you might notice a red rash with swelling of the face, lips, or lymph nodes in your neck or under your arms. You may get drowsy or dizzy. Do not drive, use machinery, or do anything that needs mental alertness until you know how this drug affects you. Do not stand up or sit up quickly, especially if you are an older patient. This reduces the risk of dizzy or fainting spells. Alcohol may interfere with the effect of this drug. Avoid alcoholic drinks. This drug will cause constipation. If you do not have a bowel movement for 3 days, call your health care provider. Your mouth may get dry. Chewing sugarless gum or sucking hard candy and drinking plenty of water may help. Contact your health care provider if the problem does not go away or is severe. What side effects may I notice from receiving this medicine? Side effects that you should report to your doctor or health care professional as soon as possible:  allergic reactions like skin rash, itching or hives, swelling of the face, lips, or tongue  breathing problems  confusion  redness, blistering, peeling or loosening of the skin, including inside the mouth  seizures  signs and symptoms of low blood pressure like dizziness; feeling faint or lightheaded, falls; unusually weak or tired  trouble passing urine or change in the amount of urine Side effects that usually do not require medical attention (report to your doctor or health care professional if they continue or are bothersome):  constipation  dry mouth  nausea, vomiting  tiredness This list may not describe all possible side effects. Call your doctor for medical advice about side effects. You may report side effects to FDA at 1-800-FDA-1088. Where should I keep my medicine? Keep out of the reach of  children. This medicine may cause accidental overdose and death if it taken by other adults, children, or pets. Mix any unused medicine with a substance like cat litter or coffee grounds. Then throw the medicine away in a sealed container  like a sealed bag or a coffee can with a lid. Do not use the medicine after the expiration date. Store at room temperature between 15 and 30 degrees C (59 and 86 degrees F). NOTE: This sheet is a summary. It may not cover all possible information. If you have questions about this medicine, talk to your doctor, pharmacist, or health care provider.  2020 Elsevier/[employer] Standard (2018-09-25 13:08:25)

## 2020-02-26 ENCOUNTER — Encounter: Payer: Self-pay | Admitting: Adult Health

## 2020-02-26 DIAGNOSIS — K862 Cyst of pancreas: Secondary | ICD-10-CM | POA: Diagnosis not present

## 2020-02-26 DIAGNOSIS — K8689 Other specified diseases of pancreas: Secondary | ICD-10-CM | POA: Diagnosis not present

## 2020-02-27 ENCOUNTER — Encounter: Payer: Self-pay | Admitting: Adult Health

## 2020-02-27 ENCOUNTER — Encounter: Payer: Medicare PPO | Admitting: Physical Therapy

## 2020-02-27 DIAGNOSIS — K862 Cyst of pancreas: Secondary | ICD-10-CM | POA: Insufficient documentation

## 2020-03-03 ENCOUNTER — Ambulatory Visit (HOSPITAL_COMMUNITY): Payer: Medicare PPO

## 2020-03-04 ENCOUNTER — Ambulatory Visit: Payer: Medicare PPO | Admitting: Cardiovascular Disease

## 2020-03-04 ENCOUNTER — Other Ambulatory Visit: Payer: Self-pay

## 2020-03-04 ENCOUNTER — Encounter: Payer: Self-pay | Admitting: Cardiovascular Disease

## 2020-03-04 DIAGNOSIS — I7300 Raynaud's syndrome without gangrene: Secondary | ICD-10-CM

## 2020-03-04 DIAGNOSIS — I10 Essential (primary) hypertension: Secondary | ICD-10-CM

## 2020-03-04 DIAGNOSIS — E785 Hyperlipidemia, unspecified: Secondary | ICD-10-CM

## 2020-03-04 DIAGNOSIS — I2510 Atherosclerotic heart disease of native coronary artery without angina pectoris: Secondary | ICD-10-CM

## 2020-03-04 DIAGNOSIS — Z951 Presence of aortocoronary bypass graft: Secondary | ICD-10-CM | POA: Diagnosis not present

## 2020-03-04 DIAGNOSIS — I350 Nonrheumatic aortic (valve) stenosis: Secondary | ICD-10-CM

## 2020-03-04 DIAGNOSIS — G4733 Obstructive sleep apnea (adult) (pediatric): Secondary | ICD-10-CM

## 2020-03-04 NOTE — Progress Notes (Signed)
Patient ID: Jasmine Reeves, female   DOB: May 30, 1940, 80 y.o.   MRN: [id number]     HPI: Jasmine Reeves is a 80 y.o. female presents for a 13 month follow cardiology evaluation.  Jasmine Reeves underwent CABG revascularization surgery x4 on 03/02/2000 by Dr. Roxy Manns with a LIMA placed her distal LAD, vein to the second diagonal, sequential vein to 2 marginal vessels arising from the circumflex coronary artery. Her RCA was free of disease and was not bypassed. Additional problems include mitral valve prolapse, Raynaud's disease, history of short bursts of nonsustained tachycardia on treadmill testing with reference to this on beta blocker therapy.,. She did develop edema remotely on amlodipine. She hasa history of documented APCs on Holter imaging which have improved with labetalol.  She underwent a nuclear perfusion study on 09/07/2013.  She was noted to have ST segment changes  on ECG but nuclear imaging showed apical thinning with breast attenuation, without ischemia.  She had normal wall motion.  She denies any awareness of palpitations.  She does have issues with some arthritic symptoms.  She takes diltiazem 180 mg in addition to losartan 25 mg, Normodyne 150 mg twice a day and HCTZ 25 mg.  She is on Vytorin 10/20 for hyperlipidemia.  She tells me she was referred for sleep study which was done in [place] after a home study was abnormal.  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 Epworth sleepiness scale score was increased at 16.  CPAP was recommended but this has not yet been initiated.  The patient was not aware that I also am involved in sleep medicine. I reviewed with her current Medicare guidelines.  Lab work by her primary physician in 2016 revealed fasting glucose of 110.  She had normal renal function.  LFTs were normal.  Lipid studies were excellent with a total cholesterol 145,  triglycerides 57, HDL 62, and LDL 72.  Hemoglobin A1c was minimally increased at 5.8.  TSH was 3.029.  When I  saw her in September 2018, she denied any chest pain.  She had experienced a presyncopal spell on a very hot day and her dose of Cardizem and losartan were reduced.  She denies any recurrent palpitations.   Her losartan and labetalol were reduced.  Presently, she is without chest pain.  She denies shortness of breath.  Her blood pressure has tended to be low.  She stopped taking hydrochlorothiazide and is now taking diltiazem 180 mg which is also helpful for her a ray nods phenomena and has only been taking labetalol 100 mg at bedtime and losartan 12.5 mg  in the morning.  She is on CPAP therapy for his obstructive sleep apnea which was diagnosed after a home sleep study in [place].  [employer] is her DME company.  Repeat laboratory by Dr. Melford Aase had shown a total cholesterol 145, HDL 57, triglycerides were increased at 215.  LDL was 60.  I  saw her in April 2019.  At that time she was remaining stable.  She not had any symptoms secondary to her Raynaud's phenomena treated with diltiazem.  Her blood pressure was low there was a 10 mm drop in blood pressure from supine to standing and I recommended discontinuance of losartan.  Over the past year and a half, she has done fairly well.  However she has had issues with degenerative scoliosis.  I last saw her on January 15, 2019 at which time  she stated that she had experienced episodes of chest comfort which seem to occur at rest and not with exertion.   She was wondering if these occurred after eating certain foods.  She has noticed some mild ankle swelling.  She has begun to notice more shortness of breath with activity more easily but admits that she has not been as active as she had been doing this Covid pandemic.  She is in need for prolapsed bladder surgery which tentatively is scheduled in on March 12, 2019. She apparently has never seen a  sleep specialist for her sleep apnea and has not been sleeping as well.  She is in need for new supplies and not been using her CPAP recently and would like to reinstitute treatment.  Her DME company is [employer].    Since it had been almost 19 years since her CABG revascularization  I recommended that she undergo a preoperative echo Doppler study and nuclear perfusion study.  The echo Doppler study January 29, 2019 showed normal EF at 60 to 65%.  There was mild aortic stenosis with a mean gradient of 10, and valve area of 1.5 cm.  A nuclear perfusion study on February 05, 2019 was normal without scar or ischemia or ECG changes.  Recent laboratory on December 3 had shown a total cholesterol 152, HDL 64, LDL 74 and triglycerides 59.  I saw her in follow-up of the above studies in December 2020.  I reviewed her preoperative echo Doppler and Lexiscan study prior to her planned bladder surgery for cystocele which was scheduled for January 2021.  Over the past year, she has done well from a cardiac standpoint.  Her husband has developed progression of his Alzheimer's disease.  They have recently moved to [facility].  When she was caring for her husband she apparently sustained a 30% fracture at T12 and went to the emergency room in December 2021.  At that time she also was experiencing hemorrhoidal bleeding.  She subsequently saw Liane Comber, NP at Dr. Idell Pickles office on February 25, 2020.  Presently she denies any chest pain.  She is to have an MRI.  She has been wearing a back brace.  She denies any Raynaud's symptomatology.  She presents for evaluation.  Past Medical History:  Diagnosis Date  . COPD (chronic obstructive pulmonary disease) ([place])   . Fibromyalgia   . GERD (gastroesophageal reflux disease)   . IBS (irritable bowel syndrome)   . Scoliosis     Past Surgical History:  Procedure Laterality Date  . ABDOMINAL HYSTERECTOMY    . APPENDECTOMY    . CORONARY  ANGIOPLASTY  12/11  . CORONARY ARTERY BYPASS GRAFT    . CYSTOCELE REPAIR  03/12/2019   cystocele and rectocele repair, Dr. Nori Riis  . ESOPHAGOGASTRODUODENOSCOPY  07/21/12   polyps, Hiatal hernia Dr. Cristina Gong  . TONSILLECTOMY    . TUBAL LIGATION      Allergies  Allergen Reactions  . Altace [Ramipril]     cough  . Azithromycin     palpitation  . Celebrex [Celecoxib]     Gi upset   . Hydrochlorothiazide     cramping  . 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 Prilosec [Omeprazole]     constipation  . Tape Other (See Comments)    Current Outpatient Medications  Medication Sig Dispense Refill  . aspirin 81 MG tablet Take 81 mg by mouth daily.    Marland Kitchen CALCIUM CITRATE  PO Take 500 mg by mouth daily.    . Cholecalciferol (VITAMIN D) 2000 UNITS tablet Take 2,000 Units by mouth 2 (two) times daily.    Marland Kitchen diltiazem (DILT-XR) 180 MG 24 hr capsule TAKE ONE CAPSULE BY MOUTH DAILY 90 capsule 1  . docusate sodium (COLACE) 100 MG capsule Take 1 capsule (100 mg total) by mouth every 12 (twelve) hours. 60 capsule 0  . estradiol (ESTRACE) 0.1 MG/GM vaginal cream estradiol 0.01% (0.1 mg/gram) vaginal cream  INSERT 2 GRAMS INTO THE VAGINA DAILY FOR FIRST WEEK THEN TWICE WEEKLY AFTER FIRST WEEK    . ezetimibe-simvastatin (VYTORIN) 10-20 MG tablet TAKE 1 TABLET BY MOUTH DAILY FOR CHOLESTEROL 90 tablet 1  . fexofenadine (ALLEGRA) 180 MG tablet Take 180 mg by mouth daily.    . fluticasone (VERAMYST) 27.5 MCG/SPRAY nasal spray Place 2 sprays into the nose daily as needed for rhinitis. 10 g 8  . IRON PO Take 50 mg by mouth daily.    Marland Kitchen labetalol (NORMODYNE) 100 MG tablet TAKE 1 TABLET(100 MG) BY MOUTH AT BEDTIME 90 tablet 3  . Magnesium 400 MG CAPS Take by mouth daily.    . meloxicam (MOBIC) 15 MG tablet Take 1/2 to 1 tablet Daily with Food for Pain & Inflammation 90 tablet 3  . methocarbamol (ROBAXIN) 500 MG tablet Take 1 tablet (500 mg total) by mouth every 8 (eight) hours as  needed. For muscle spasms. Do not take and drive. 60 tablet 0  . mirabegron ER (MYRBETRIQ) 50 MG TB24 tablet Take      1 tablet       Daily       for Bladder 90 tablet 0  . Multiple Vitamins-Minerals (MULTIVITAMIN WITH MINERALS) tablet Take 1 tablet by mouth daily.    . nitroGLYCERIN (NITROSTAT) 0.4 MG SL tablet Place 1 tablet (0.4 mg total) under the tongue every 5 (five) minutes as needed for chest pain. 25 tablet 12  . pantoprazole (PROTONIX) 40 MG tablet Take 1 tablet 2 x /day for Acid Indigestion & Reflux 180 tablet 3  . sucralfate (CARAFATE) 1 g tablet TAKE 1 TABLET(1 GRAM) BY MOUTH FOUR TIMES DAILY 360 tablet 0  . traMADol (ULTRAM) 50 MG tablet Take 1/2-1 tab every 4 hours as needed for severe pain. Use tylenol/ibuprofen for mild-mod pain. 30 tablet 0  . triamcinolone ointment (KENALOG) 0.1 % Apply 1 application topically 2 (two) times daily. 80 g 1  . vitamin C (ASCORBIC ACID) 500 MG tablet Take 500 mg by mouth daily.    . Zinc 50 MG TABS Take by mouth daily.     No current facility-administered medications for this visit.    Social History   Socioeconomic History  . Marital status: Married    Spouse name: Not on file  . Number of children: Not on file  . Years of education: Not on file  . Highest education level: Not on file  Occupational History  . Not on file  Tobacco Use  . Smoking status: Former Smoker    Quit date: 09/03/1965    Years since quitting: 54.5  . Smokeless tobacco: Never Used  Substance and Sexual Activity  . Alcohol use: Yes    Alcohol/week: 3.0 standard drinks    Types: 3 Standard drinks or equivalent per week    Comment: rare  . Drug use: No  . Sexual activity: Not on file  Other Topics Concern  . Not on file  Social History Narrative  . Not on  file   Social Determinants of Health   Financial Resource Strain: Not on file  Food Insecurity: Not on file  Transportation Needs: Not on file  Physical Activity: Not on file  Stress: Not on file   Social Connections: Not on file  Intimate Partner Violence: Not on file   Socially she is married has 2 children 3 grandchildren. She does not routinely exercise. There is no tobacco use. She does drink occasional alcohol. She is retired Systems analyst.  Family History  Problem Relation Age of Onset  . Heart disease Brother   . Hyperlipidemia Brother    ROS General: Negative; No fevers, chills, or night sweats;  HEENT: Negative; No changes in vision or hearing, sinus congestion, difficulty swallowing Pulmonary: Negative; No cough, wheezing, shortness of breath, hemoptysis Cardiovascular: Negative; No chest pain, presyncope, syncope, palpatations GI: Negative; No nausea, vomiting, diarrhea, or abdominal pain GU: Negative; No dysuria, hematuria, or difficulty voiding Musculoskeletal: Positive for a Raynaud's; no myalgias, joint pain, or weakness Hematologic/Oncology: Negative; no easy bruising, bleeding Endocrine: Negative; no heat/cold intolerance; no diabetes Neuro: Negative; no changes in balance, headaches Skin: Negative; No rashes or skin lesions Psychiatric: Negative; No behavioral problems, depression Sleep: Positive for obstructive sleep apnea on CPAP;  in need of new supplies as result has not been using Other comprehensive 14 point system review is negative.  PE BP 130/78   Pulse 75   Ht _0  (1.651 m)   Wt 138 lb 6.4 oz (62.8 kg)   BMI 23.03 kg/m    Repeat blood pressure by me was 130/78  Wt Readings from Last 3 Encounters:  03/04/20 138 lb 6.4 oz (62.8 kg)  02/25/20 140 lb (63.5 kg)  02/16/20 138 lb (62.6 kg)   General: Alert, oriented, no distress.  Skin: normal turgor, no rashes, warm and dry HEENT: Normocephalic, atraumatic. Pupils equal round and reactive to light; sclera anicteric; extraocular muscles intact;  Nose without nasal septal hypertrophy Mouth/Parynx benign; Mallinpatti scale 3 Neck: No JVD, no carotid bruits; normal carotid  upstroke Lungs: clear to ausculatation and percussion; no wheezing or rales Chest wall: without tenderness to palpitation Heart: PMI not displaced, RRR, s1 s2 normal, 1/6 systolic murmur, no diastolic murmur, no rubs, gallops, thrills, or heaves Abdomen: soft, nontender; no hepatosplenomehaly, BS+; abdominal aorta nontender and not dilated by palpation. Back: She is wearing a brace around her abdomen and back; no CVA tenderness Pulses 2+ Musculoskeletal: full range of motion, normal strength, no joint deformities Extremities: no clubbing cyanosis or edema, Homan's sign negative  Neurologic: grossly nonfocal; Cranial nerves grossly wnl Psychologic: Normal mood and affect   ECG (independently read by me):Sinus rhythm at 75 with [facility]  February 14, 2020 ECG (independently read by me): Normal sinus rhythm at 64 bpm.  Isolated PAC, nonspecific ST changes.  Normal intervals.  November 2020 ECG (independently read by me): Sinus rhythm at 61 bpm with mild sinus arrhythmia.  Normal intervals.  No ST segment changes  April 2019  ECG (independently read by me): Sinus bradycardia 58 bpm.  No ectopy.  Normal intervals.  September 2018 ECG (independently read by me): Normal sinus rhythm at 62 bpm.  Nonspecific T-wave abnormality  September 2017 ECG (independently read by me): Sinus bradycardia 56 bpm with mild sinus arrhythmia.  Nonspecific T changes.  September 2016 ECG (independently read by me): Sinus bradycardia with sinus arrhythmia.  No significant ST-T changes.  August 2015 ECG: Sinus rhythm at 51 beats per minute. Non-specific T  changes.  LABS: BMP Latest Ref Rng & Units 02/25/2020 02/16/2020 02/12/2020  Glucose 65 - 99 mg/dL 80 99 86  BUN 7 - 25 mg/dL 17 26(H) 13  Creatinine 0.60 - 0.93 mg/dL 0.69 0.58 0.64  BUN/Creat Ratio 6 - 22 (calc) NOT APPLICABLE - NOT APPLICABLE  Sodium 671 - 146 mmol/L 141 138 141  Potassium 3.5 - 5.3 mmol/L 3.8 3.2(L) 4.1  Chloride 98 - 110 mmol/L 103 102 103   CO2 20 - 32 mmol/L _0 Calcium 8.6 - 10.4 mg/dL 9.5 8.5(L) 9.3   Hepatic Function Latest Ref Rng & Units 02/16/2020 02/12/2020 10/30/2019  Total Protein 6.5 - 8.1 g/dL 6.2(L) 6.2 6.8  Albumin 3.5 - 5.0 g/dL 3.9 - -  AST 15 - 41 U/L _1 ALT 0 - 44 U/L 42 18 25  Alk Phosphatase 38 - 126 U/L 64 - -  Total Bilirubin 0.3 - 1.2 mg/dL 0.4 0.4 0.6  Bilirubin, Direct 0.0 - 0.2 mg/dL - - -   CBC Latest Ref Rng & Units 02/16/2020 02/12/2020 10/30/2019  WBC 4.0 - 10.5 K/uL 9.4 5.6 5.1  Hemoglobin 12.0 - 15.0 g/dL 12.3 12.4 13.3  Hematocrit 36.0 - 46.0 % 36.6 37.1 40.0  Platelets 150 - 400 K/uL 242 234 200   Lab Results  Component Value Date   MCV 92.9 02/16/2020   MCV 92.3 02/12/2020   MCV 93.5 10/30/2019   Lab Results  Component Value Date   TSH 3.23 02/12/2020   Lab Results  Component Value Date   HGBA1C 5.8 (H) 10/30/2019   Lipid Panel     Component Value Date/Time   CHOL 137 02/12/2020 1530   TRIG 93 02/12/2020 1530   HDL 54 02/12/2020 1530   CHOLHDL 2.5 02/12/2020 1530   VLDL 22 10/04/2016 1114   LDLCALC 65 02/12/2020 1530    RADIOLOGY: No results found.  IMPRESSION:  1. CAD in native artery   2. Hx of CABG   3. Essential hypertension   4. Hyperlipidemia with target LDL less than 70   5. OSA (obstructive sleep apnea)   6. Raynaud's phenomenon without gangrene   7. Mild aortic stenosis     ASSESSMENT AND PLAN: Jasmine Reeves is a 80 years old Caucasian female who underwent cardiac catheterization on New Year's Eve 2001.  Catheterization revealed severe two-vessel coronary artery disease and she underwent CABG revascularization surgery on 03/02/2000. Her last catheterization in 2008 showed patent grafts. She has normal LV function and mitral valve prolapse.   A nuclear perfusion study in 2015 was unchanged and continued to show normal perfusion without scar or ischemia.  She did have exercise-induced ST changes.  She has a history of Raynaud's phenomena  which has successfully been treated with her diltiazem.  Her most recent nuclear and echo studies were done in late 2020 prior to her planned bladder surgery.  She again was documented to have normal systolic function and had mild aortic stenosis with a mean gradient of 10 and peak gradient of 14 mm.  Estimated aortic valve area was 1.59 cm.  Nuclear study continue to show normal perfusion.  She has continued to use CPAP therapy for her obstructive sleep apnea.  She recently sustained a 30% partial fracture at T12 when she was helping care for her husband who is developed progressive Parkinson's debility.  She is not having any anginal symptomatology.  She will be seeing Dr[facility] Maudie Mercury at [facility] who has taken care  of her son who had rectal cancer.  She also will have an MRI.  Her blood pressure today is controlled on her regimen of the diltiazem long-acting 180 mg, labetalol 100 mg at bedtime.  She continues to be on generic Vytorin 10/20 for hyperlipidemia and most recent laboratory from February 12, 2020 showed an excellent LDL at 65.  She has stable renal function with a creatinine of 0.69.  Hemoglobin A1c was stable at 5.8. From a cardiac standpoint she is stable and I will see her in 1 year for reevaluation.   Troy Sine, MD, [facility]  03/06/2020 2:26 PM

## 2020-03-04 NOTE — Patient Instructions (Signed)
Medication Instructions:  No changes *If you need a refill on your cardiac medications before your next appointment, please call your pharmacy*   Lab Work: None ordered If you have labs (blood work) drawn today and your tests are completely normal, you will receive your results only by: . MyChart Message (if you have MyChart) OR . A paper copy in the mail If you have any lab test that is abnormal or we need to change your treatment, we will call you to review the results.   Testing/Procedures: None ordered   Follow-Up: At [facility], you and your health needs are our priority.  As part of our continuing mission to provide you with exceptional heart care, we have created designated Provider Care Teams.  These Care Teams include your primary Cardiologist (physician) and Advanced Practice Providers (APPs -  Physician Assistants and Nurse Practitioners) who all work together to provide you with the care you need, when you need it.  We recommend signing up for the patient portal called "MyChart".  Sign up information is provided on this After Visit Summary.  MyChart is used to connect with patients for Virtual Visits (Telemedicine).  Patients are able to view lab/test results, encounter notes, upcoming appointments, etc.  Non-urgent messages can be sent to your provider as well.   To learn more about what you can do with MyChart, go to [email or web address].    Your next appointment:   12 month(s)  The format for your next appointment:   In Person  Provider:   Thomas Kelly, MD   Other Instructions None  

## 2020-03-05 DIAGNOSIS — R488 Other symbolic dysfunctions: Secondary | ICD-10-CM | POA: Diagnosis not present

## 2020-03-05 DIAGNOSIS — R41841 Cognitive communication deficit: Secondary | ICD-10-CM | POA: Diagnosis not present

## 2020-03-05 DIAGNOSIS — M5459 Other low back pain: Secondary | ICD-10-CM | POA: Diagnosis not present

## 2020-03-05 DIAGNOSIS — M6281 Muscle weakness (generalized): Secondary | ICD-10-CM | POA: Diagnosis not present

## 2020-03-06 ENCOUNTER — Encounter: Payer: Self-pay | Admitting: Cardiovascular Disease

## 2020-03-10 DIAGNOSIS — M5459 Other low back pain: Secondary | ICD-10-CM | POA: Diagnosis not present

## 2020-03-11 DIAGNOSIS — M6281 Muscle weakness (generalized): Secondary | ICD-10-CM | POA: Diagnosis not present

## 2020-03-11 DIAGNOSIS — M5459 Other low back pain: Secondary | ICD-10-CM | POA: Diagnosis not present

## 2020-03-12 ENCOUNTER — Ambulatory Visit (INDEPENDENT_AMBULATORY_CARE_PROVIDER_SITE_OTHER): Payer: Medicare PPO

## 2020-03-12 ENCOUNTER — Ambulatory Visit: Payer: Medicare PPO | Admitting: Orthopaedic Surgery

## 2020-03-12 ENCOUNTER — Encounter: Payer: Self-pay | Admitting: Orthopaedic Surgery

## 2020-03-12 ENCOUNTER — Other Ambulatory Visit: Payer: Self-pay

## 2020-03-12 ENCOUNTER — Other Ambulatory Visit: Payer: Medicare PPO

## 2020-03-12 VITALS — BP 145/81 | HR 70 | Ht 65.0 in | Wt 134.0 lb

## 2020-03-12 DIAGNOSIS — S22080A Wedge compression fracture of T11-T12 vertebra, initial encounter for closed fracture: Secondary | ICD-10-CM | POA: Diagnosis not present

## 2020-03-12 NOTE — Progress Notes (Signed)
Office Visit Note   Patient: Jasmine Reeves           Date of Birth: 1940/04/28           MRN: [id number] Visit Date: 03/12/2020              Requested by: [name], MD [id number] [place],  [place] [address] PCP: [name], MD   Assessment & Plan: Visit Diagnoses:  1. Compression fracture of T12 vertebra, initial encounter [facility])     Plan: Reviewed radiograph CT scan and findings on exam with patient and her son.  We will order a bone density test with a new T12 compression fracture.  She may be a candidate to restart biphosphonate therapy.  Continue calcium, vitamin D.  I discussed with her I would recommend she not drive unless its emergency until her fracture is healed.  She can stop therapy and currently leg lifts and other exercises are not helpful until her fracture is healed.  I encouraged her to walk daily and a cane was supplied and fitted to decrease falling risk.  This is for likely short-term use only.  Recheck 1 month repeat lumbar x-ray on return to visualize T12 compression fracture.  Follow-Up Instructions: No follow-ups on file.   Orders:  Orders Placed This Encounter  Procedures  . XR Lumbar Spine 2-3 Views   No orders of the defined types were placed in this encounter.     Procedures: No procedures performed   Clinical Data: No additional findings.   Subjective: Chief Complaint  Patient presents with  . Lower Back - Fracture    HPI 81 year old female with onset of back pain first week of December.  Patient's husband has Parkinson's she has had to bend over help him with his socks and shoes.  She does not recall any specific incident sharp pain.  Pain gradually got worse she took Tylenol without relief went to the emergency department had a CT scan which showed T12 compression fracture at that time was 30%.  Patient has been on calcium taking Citracal and also vitamin D.  She states she had bone density in the past which  is least showed she had osteopenia.  In the distant past she had been on Fosamax.  She is placed on a lumbar corset.  She was sent to therapy was told to sit in a straight back chair stay out of recliner.  Patient recently moved to [facility] and is in independent living.  She states of some the exercises that are doing or doing leg lifts.  She denies associated bowel bladder symptoms in the last month she noticed some gradual improvement in her pain.  With the brace and pain in her back she states she feels somewhat unsteady.  Past problems with IBS, hypertension.  Patient is here with her son.  Past history of COPD and hyperlipidemia.  OSA on CPAP.  Review of Systems all other systems noncontributory to HPI.   Objective: Vital Signs: BP (!) 145/81   Pulse 70   Ht 5\' 5"  (1.651 m)   Wt 134 lb (60.8 kg)   BMI 22.30 kg/m   Physical Exam Constitutional:      Appearance: She is well-developed.  HENT:     Head: Normocephalic.     Right Ear: External ear normal.     Left Ear: External ear normal.  Eyes:     Pupils: Pupils are equal, round, and reactive to light.  Neck:     Thyroid: No thyromegaly.     Trachea: No tracheal deviation.  Cardiovascular:     Rate and Rhythm: Normal rate.  Pulmonary:     Effort: Pulmonary effort is normal.  Abdominal:     Palpations: Abdomen is soft.  Skin:    General: Skin is warm and dry.  Neurological:     Mental Status: She is alert and oriented to person, place, and time.  Psychiatric:        Mood and Affect: Mood and affect normal.        Behavior: Behavior normal.     Ortho Exam patient has 1+ knee and ankle jerks symmetrical negative straight leg raising 90 degrees.  Negative logroll of the hips.  No quad atrophy anterior tib gastrocsoleus ankle dorsiflexion plantarflexion is strong and active and symmetrical.  Specialty Comments:  No specialty comments available.  Imaging: No results found.   PMFS History: Patient Active Problem  List   Diagnosis Date Noted  . 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 Compression fracture of T12 vertebra (HCC) 02/21/2020  . Cystocele with rectocele 12/13/2018  . Aortic atherosclerosis (HCC) by Abd CTscan on 02/16/2020 03/06/2018  . BMI 24.0-24.9, adult 11/01/2017  . Osteoporosis with current pathological fracture 04/12/2017  . OSA on CPAP 11/27/2014  . IBS (irritable bowel syndrome) 11/11/2014  . Encounter for Medicare annual wellness exam 11/11/2014  . Medication management 04/12/2014  . Abnormal blood sugar 10/10/2013  . Mixed hyperlipidemia 10/08/2013  . CAD (coronary artery disease) 02/03/2013  . MVP (mitral valve prolapse) 02/03/2013  . Vitamin D deficiency   . GERD (gastroesophageal reflux disease)   . COPD (chronic obstructive pulmonary disease) (HCC) 02/11/2010  . Essential hypertension 01/28/2010  . RAYNAUDS SYNDROME 01/28/2010   Past Medical History:  Diagnosis Date  . COPD (chronic obstructive pulmonary disease) (HCC)   . Fibromyalgia   . GERD (gastroesophageal reflux disease)   . IBS (irritable bowel syndrome)   . Scoliosis     Family History  Problem Relation Age of Onset  . Heart disease Brother   . Hyperlipidemia Brother     Past Surgical History:  Procedure Laterality Date  . ABDOMINAL HYSTERECTOMY    . APPENDECTOMY    . CORONARY ANGIOPLASTY  12/11  . CORONARY ARTERY BYPASS GRAFT    . CYSTOCELE REPAIR  03/12/2019   cystocele and rectocele repair, Dr. Jennette Kettle  . ESOPHAGOGASTRODUODENOSCOPY  07/21/12   polyps, Hiatal hernia Dr. Matthias Hughs  . TONSILLECTOMY    . TUBAL LIGATION     Social History   Occupational History  . Not on file  Tobacco Use  . Smoking status: Former Smoker    Quit date: 09/03/1965    Years since quitting: 54.5  . Smokeless tobacco: Never Used  Substance and Sexual Activity  . Alcohol use: Yes    Alcohol/week: 3.0 standard drinks    Types: 3 Standard drinks or equivalent per week    Comment: rare  . Drug use: No  . Sexual activity: Not  on file

## 2020-03-14 ENCOUNTER — Other Ambulatory Visit: Payer: Self-pay | Admitting: Internal Medicine

## 2020-03-26 ENCOUNTER — Encounter: Payer: Self-pay | Admitting: Internal Medicine

## 2020-03-31 DIAGNOSIS — D490 Neoplasm of unspecified behavior of digestive system: Secondary | ICD-10-CM | POA: Diagnosis not present

## 2020-04-04 ENCOUNTER — Other Ambulatory Visit: Payer: Self-pay | Admitting: Adult Health

## 2020-04-04 DIAGNOSIS — I10 Essential (primary) hypertension: Secondary | ICD-10-CM

## 2020-04-04 DIAGNOSIS — E782 Mixed hyperlipidemia: Secondary | ICD-10-CM

## 2020-04-15 ENCOUNTER — Ambulatory Visit (INDEPENDENT_AMBULATORY_CARE_PROVIDER_SITE_OTHER): Payer: Medicare PPO

## 2020-04-15 ENCOUNTER — Ambulatory Visit: Payer: Medicare PPO | Admitting: Orthopaedic Surgery

## 2020-04-15 ENCOUNTER — Encounter: Payer: Self-pay | Admitting: Internal Medicine

## 2020-04-15 VITALS — BP 142/79 | HR 77

## 2020-04-15 DIAGNOSIS — S22080A Wedge compression fracture of T11-T12 vertebra, initial encounter for closed fracture: Secondary | ICD-10-CM | POA: Diagnosis not present

## 2020-04-15 NOTE — Progress Notes (Signed)
Office Visit Note   Patient: Jasmine Reeves           Date of Birth: 1940-11-20           MRN: [id number] Visit Date: 04/15/2020              Requested by: [name], MD [address] [place],  [place] [address] PCP: [name], MD   Assessment & Plan: Visit Diagnoses:  1. Compression fracture of T12 vertebra, initial encounter [facility])     Plan: Patient would be up for another bone density test in November.  She will continue calcium and vitamin D and walking program.  She is getting improvement every month.  If bone density scan is worse she likely will be a candidate for biphosphonate treatment.  Continue ambulation recheck 1 month.  Single lateral x-ray on return.  Follow-Up Instructions: Return in about 1 month (around 05/13/2020).   Orders:  Orders Placed This Encounter  Procedures  . XR Lumbar Spine 2-3 Views   No orders of the defined types were placed in this encounter.     Procedures: No procedures performed   Clinical Data: No additional findings.   Subjective: Chief Complaint  Patient presents with  . Other     F/u T12 compression fx    HPI 80 year old female returns for follow-up of T12 compression fracture.  She was helping her husband keep from falling and she is trying to get up status but she does not exactly remember exact time of her injury and did not have a fall.  She continues her lumbar corset which gives her relief.  Review of Systems 14 point system update unchanged from 03/12/2020.  COPD, hyperlipidemia, CPAP, IBS hypertension unchanged.  Objective: Vital Signs: BP (!) 142/79   Pulse 77   Physical Exam Constitutional:      Appearance: She is well-developed.  HENT:     Head: Normocephalic.     Right Ear: External ear normal.     Left Ear: External ear normal.  Eyes:     Pupils: Pupils are equal, round, and reactive to light.  Neck:     Thyroid: No thyromegaly.     Trachea: No tracheal deviation.   Cardiovascular:     Rate and Rhythm: Normal rate.  Pulmonary:     Effort: Pulmonary effort is normal.  Abdominal:     Palpations: Abdomen is soft.  Skin:    General: Skin is warm and dry.  Neurological:     Mental Status: She is alert and oriented to person, place, and time.  Psychiatric:        Mood and Affect: Mood and affect normal.        Behavior: Behavior normal.     Ortho Exam patient is ambulatory with a cane.  She is using her brace.  Specialty Comments:  No specialty comments available.  Imaging: XR Lumbar Spine 2-3 Views  Result Date: 04/16/2020 Single lateral x-ray lumbar spine demonstrates T12 fracture unchanged from 03/12/2020 images approximately 50% compression. Impression: Stable T12 compression fracture.    PMFS History: Patient Active Problem List   Diagnosis Date Noted  . 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 Compression fracture of T12 vertebra (HCC) 02/21/2020  . Cystocele with rectocele 12/13/2018  . Aortic atherosclerosis (HCC) by Abd CTscan on 02/16/2020 03/06/2018  . BMI 24.0-24.9, adult 11/01/2017  . Osteoporosis with current pathological fracture 04/12/2017  . OSA on CPAP 11/27/2014  . IBS (irritable bowel syndrome) 11/11/2014  .  Encounter for Medicare annual wellness exam 11/11/2014  . Medication management 04/12/2014  . Abnormal blood sugar 10/10/2013  . Mixed hyperlipidemia 10/08/2013  . CAD (coronary artery disease) 02/03/2013  . MVP (mitral valve prolapse) 02/03/2013  . Vitamin D deficiency   . GERD (gastroesophageal reflux disease)   . COPD (chronic obstructive pulmonary disease) (HCC) 02/11/2010  . Essential hypertension 01/28/2010  . RAYNAUDS SYNDROME 01/28/2010   Past Medical History:  Diagnosis Date  . COPD (chronic obstructive pulmonary disease) (HCC)   . Fibromyalgia   . GERD (gastroesophageal reflux disease)   . IBS (irritable bowel syndrome)   . Scoliosis     Family History  Problem Relation Age of Onset  . Heart disease  Brother   . Hyperlipidemia Brother     Past Surgical History:  Procedure Laterality Date  . ABDOMINAL HYSTERECTOMY    . APPENDECTOMY    . CORONARY ANGIOPLASTY  12/11  . CORONARY ARTERY BYPASS GRAFT    . CYSTOCELE REPAIR  03/12/2019   cystocele and rectocele repair, Dr. Jennette Kettle  . ESOPHAGOGASTRODUODENOSCOPY  07/21/12   polyps, Hiatal hernia Dr. Matthias Hughs  . TONSILLECTOMY    . TUBAL LIGATION     Social History   Occupational History  . Not on file  Tobacco Use  . Smoking status: Former Smoker    Quit date: 09/03/1965    Years since quitting: 54.6  . Smokeless tobacco: Never Used  Substance and Sexual Activity  . Alcohol use: Yes    Alcohol/week: 3.0 standard drinks    Types: 3 Standard drinks or equivalent per week    Comment: rare  . Drug use: No  . Sexual activity: Not on file

## 2020-04-28 DIAGNOSIS — N952 Postmenopausal atrophic vaginitis: Secondary | ICD-10-CM | POA: Diagnosis not present

## 2020-04-28 DIAGNOSIS — N819 Female genital prolapse, unspecified: Secondary | ICD-10-CM | POA: Diagnosis not present

## 2020-04-28 DIAGNOSIS — R102 Pelvic and perineal pain: Secondary | ICD-10-CM | POA: Diagnosis not present

## 2020-04-28 DIAGNOSIS — N3946 Mixed incontinence: Secondary | ICD-10-CM | POA: Diagnosis not present

## 2020-04-28 DIAGNOSIS — M6289 Other specified disorders of muscle: Secondary | ICD-10-CM | POA: Diagnosis not present

## 2020-04-29 ENCOUNTER — Telehealth: Payer: Self-pay

## 2020-04-29 NOTE — Telephone Encounter (Signed)
Requesting a referral to [facility] to get her hearing aids, covered by Medicare.

## 2020-04-30 ENCOUNTER — Other Ambulatory Visit: Payer: Self-pay | Admitting: Adult Health

## 2020-04-30 DIAGNOSIS — H9190 Unspecified hearing loss, unspecified ear: Secondary | ICD-10-CM

## 2020-05-07 DIAGNOSIS — Z1283 Encounter for screening for malignant neoplasm of skin: Secondary | ICD-10-CM | POA: Diagnosis not present

## 2020-05-07 DIAGNOSIS — L820 Inflamed seborrheic keratosis: Secondary | ICD-10-CM | POA: Diagnosis not present

## 2020-05-07 DIAGNOSIS — D225 Melanocytic nevi of trunk: Secondary | ICD-10-CM | POA: Diagnosis not present

## 2020-05-11 DIAGNOSIS — I700 Atherosclerosis of aorta: Secondary | ICD-10-CM | POA: Insufficient documentation

## 2020-05-11 NOTE — Progress Notes (Signed)
History of Present Illness:       This very nice 80 y.o. MWF presents for 6 month follow up with HTN, ASCAD, HLD, Pre-Diabetes and Vitamin D Deficiency. Patient is being followed by Dr Ophelia Charter for a T12 compression Fx.   Patient has OSA and she was recommended CPAP. Abdominal CT scan on 02/16/2020 showed  Aortic Atherosclerosis.       The Abdominal CT in Dec 2021 also  showed 2 cysts in the pancreatic tail & patient was seen by Dr. Whitney Post, [facility] surgical oncologist and had an Abd MRI which showed 1 cyst - 1.6 cm. and recommendations were to "Reimage q36mo x 4, then q1y x 2, then q2y x 3        -STOP if stable over 10 years ".       Patient is treated for HTN (1988)  & BP has been controlled at home. Today's BP is at goal -  114/76.  In 2001, patient present with ACS & had PCA/Stents x 2 and then in 2002, she underwent CABG.  In 2015 and Dec 2020 , she had negative Myoviews. Patient has had no complaints of any cardiac type chest pain, palpitations, dyspnea / orthopnea / PND, dizziness, claudication, or dependent edema.       Hyperlipidemia is controlled with diet & meds. Patient denies myalgias or other med SE's. Last Lipids were at goal:  Lab Results  Component Value Date   CHOL 137 02/12/2020   HDL 54 02/12/2020   LDLCALC 65 02/12/2020   TRIG 93 02/12/2020   CHOLHDL 2.5 02/12/2020    Also, the patient has history of PreDiabetes and has had no symptoms of reactive hypoglycemia, diabetic polys, paresthesias or visual blurring.  Last A1c was not at goal:  Lab Results  Component Value Date   HGBA1C 5.8 (H) 10/30/2019       Further, the patient also has history of Vitamin D Deficiency and supplements vitamin D without any suspected side-effects. Last vitamin D was at goal:   Lab Results  Component Value Date   VD25OH 72 10/30/2019    Current Outpatient Medications on File Prior to Visit  Medication Sig  . aspirin 81 MG tablet Take  daily.  Marland Kitchen CALCIUM CITRATE   500 mg  Take daily.  Marland Kitchen VITAMIN D 2000 UNITS  Take 2,000 Units  2  times daily.  Marland Kitchen diltiazem-XR 180 MG 24 hr  TAKE ONE CAPSULE  DAILY  . docusate  100 MG capsule Take 1 capsule every 12  hours.  Marland Kitchen ezetimibe-simvastatin  10-20 MG tablet Take  1 tablet  Daily  for Cholesterol  . fexofenadine  180 MG tablet Take daily.  . fluticasone  nasal spray Place 2 sprays into the nose daily as needed.  . IRON PO Take 5 daily.  Marland Kitchen labetalol (00 MG tablet TAKE 1 TABLET AT BEDTIME  . Magnesium 400 MG CAPS Take by mouth daily.  . meloxicam (MOBIC) 15 MG tablet Take 1/2 to 1 tablet Daily   . methocarbamol  500 MG tablet Take 1 tablet  every 8 hours as needed.  . Multiple Vitamins-Minerals  Take 1 tablet by mouth daily.  Marland Kitchen MYRBETRIQ 50 MG TB24 tablet TAKE 1 TABLET  DAILY FOR BLADDER  . NITROSTAT 0.4 MG SL tablet as needed for chest pain.  . pantoprazole (PROTONIX) 40 MG tablet Take 1 tablet 2 x /day for Acid Indigestion & Reflux  . sucralfate (CARAFATE) 1 g  tablet TAKE 1 TABLET  FOUR TIMES DAILY  . triamcinolone oint  0.1 % Apply  2  times daily.  . vitamin C 500 MG tablet Take  daily.  . Zinc 50 MG TABS Take  daily.    Allergies  Allergen Reactions  . Altace [Ramipril]     cough  . Azithromycin     palpitation  . Celebrex [Celecoxib]     Gi upset   . Hydrochlorothiazide     cramping  . 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 Prilosec [Omeprazole]     constipation  . Tape Other (See Comments)    PMHx:   Past Medical History:  Diagnosis Date  . COPD (chronic obstructive pulmonary disease) (HCC)   . Fibromyalgia   . GERD (gastroesophageal reflux disease)   . IBS (irritable bowel syndrome)   . Scoliosis     Immunization History  Administered Date(s) Administered  . Influenza, High Dose Seasonal PF 11/11/2014, 12/08/2015  . Influenza,inj,Quad PF,6+ Mos 11/27/2016, 11/20/2017, 11/14/2018  . Influenza-Unspecified 11/20/2019  . PFIZER(Purple Top)SARS-COV-2  Vaccination 04/03/2019, 04/26/2019  . Pneumococcal Conjugate-13 04/16/2014  . Pneumococcal-Unspecified 03/01/1998, 03/12/2010  . Td 03/27/2012  . Tdap 02/10/2018  . Zoster 03/02/2007    Past Surgical History:  Procedure Laterality Date  . ABDOMINAL HYSTERECTOMY    . APPENDECTOMY    . CORONARY ANGIOPLASTY  12/11  . CORONARY ARTERY BYPASS GRAFT    . CYSTOCELE REPAIR  03/12/2019   cystocele and rectocele repair, Dr. Jennette Kettle  . ESOPHAGOGASTRODUODENOSCOPY  07/21/12   polyps, Hiatal hernia Dr. Matthias Hughs  . TONSILLECTOMY    . TUBAL LIGATION      FHx:    Reviewed / unchanged  SHx:    Reviewed / unchanged   Systems Review:  Constitutional: Denies fever, chills, wt changes, headaches, insomnia, fatigue, night sweats, change in appetite. Eyes: Denies redness, blurred vision, diplopia, discharge, itchy, watery eyes.  ENT: Denies discharge, congestion, post nasal drip, epistaxis, sore throat, earache, hearing loss, dental pain, tinnitus, vertigo, sinus pain, snoring.  CV: Denies chest pain, palpitations, irregular heartbeat, syncope, dyspnea, diaphoresis, orthopnea, PND, claudication or edema. Respiratory: denies cough, dyspnea, DOE, pleurisy, hoarseness, laryngitis, wheezing.  Gastrointestinal: Denies dysphagia, odynophagia, heartburn, reflux, water brash, abdominal pain or cramps, nausea, vomiting, bloating, diarrhea, constipation, hematemesis, melena, hematochezia  or hemorrhoids. Genitourinary: Denies dysuria, frequency, urgency, nocturia, hesitancy, discharge, hematuria or flank pain. Musculoskeletal: Denies arthralgias, myalgias, stiffness, jt. swelling, pain, limping or strain/sprain.  Skin: Denies pruritus, rash, hives, warts, acne, eczema or change in skin lesion(s). Neuro: No weakness, tremor, incoordination, spasms, paresthesia or pain. Psychiatric: Denies confusion, memory loss or sensory loss. Endo: Denies change in weight, skin or hair change.  Heme/Lymph: No excessive bleeding,  bruising or enlarged lymph nodes.  Physical Exam  BP 114/76   Pulse 61   Temp 97.7 F (36.5 C)   Resp 16   Ht 5\' 5"  (1.651 m)   Wt 138 lb 3.2 oz (62.7 kg)   SpO2 98%   BMI 23.00 kg/m   Appears  well nourished, well groomed  and in no distress.  Eyes: PERRLA, EOMs, conjunctiva no swelling or erythema. Sinuses: No frontal/maxillary tenderness ENT/Mouth: EAC's clear, TM's nl w/o erythema, bulging. Nares clear w/o erythema, swelling, exudates. Oropharynx clear without erythema or exudates. Oral hygiene is good. Tongue normal, non obstructing. Hearing intact.  Neck: Supple. Thyroid not palpable. Car 2+/2+ without bruits, nodes or JVD. Chest: Respirations nl with BS clear &  equal w/o rales, rhonchi, wheezing or stridor.  Cor: Heart sounds normal w/ regular rate and rhythm without sig. murmurs, gallops, clicks or rubs. Peripheral pulses normal and equal  without edema.  Abdomen: deferred  (Patient wearing a back brace /abdominal girdle ) Lymphatics: Unremarkable.  Musculoskeletal: Full ROM all peripheral extremities, joint stability, 5/5 strength and normal gait.  Skin: Warm, dry without exposed rashes, lesions or ecchymosis apparent.  Neuro: Cranial nerves intact, reflexes equal bilaterally. Sensory-motor testing grossly intact. Tendon reflexes grossly intact.  Pysch: Alert & oriented x 3.  Insight and judgement nl & appropriate. No ideations.  Assessment and Plan:  1. Essential hypertension  - Continue medication, monitor blood pressure at home.  - Continue DASH diet.  Reminder to go to the ER if any CP,  SOB, nausea, dizziness, severe HA, changes vision/speech.  - CBC with Differential/Platelet - COMPLETE METABOLIC PANEL WITH GFR - Magnesium - TSH  2. Mixed hyperlipidemia  - Continue diet/meds, exercise,& lifestyle modifications.  - Continue monitor periodic cholesterol/liver & renal functions   - Lipid panel - TSH  3. Abnormal glucose  - Continue diet, exercise  -  Lifestyle modifications.  - Monitor appropriate labs  - Hemoglobin A1c - Insulin, random  4. Vitamin D deficiency  - Continue supplementation.  - VITAMIN D 25 Hydroxy  5. OSA on CPAP   6. Coronary artery disease involving native coronary artery  of native heart with other form of angina pectoris (HCC)  - Lipid panel  7. Aortic atherosclerosis (HCC) by Abd CT scan on 02/16/2020  - Lipid panel  8. Medication management  - CBC with Differential/Platelet - COMPLETE METABOLIC PANEL WITH GFR - Magnesium - Lipid panel - TSH - Hemoglobin A1c - Insulin, random - VITAMIN D 25 Hydroxy        Discussed  regular exercise, BP monitoring, weight control to achieve/maintain BMI less than 25 and discussed med and SE's. Recommended labs to assess and monitor clinical status with further disposition pending results of labs.  I discussed the assessment and treatment plan with the patient. The patient was provided an opportunity to ask questions and all were answered. The patient agreed with the plan and demonstrated an understanding of the instructions.  I provided over 30 minutes of exam, counseling, chart review and  complex critical decision making.        The patient was advised to call back or seek an in-person evaluation if the symptoms worsen or if the condition fails to improve as anticipated.   Marinus Maw, MD

## 2020-05-11 NOTE — Patient Instructions (Signed)

## 2020-05-12 ENCOUNTER — Other Ambulatory Visit: Payer: Self-pay

## 2020-05-12 ENCOUNTER — Telehealth: Payer: Self-pay | Admitting: *Deleted

## 2020-05-12 ENCOUNTER — Ambulatory Visit: Payer: Medicare PPO | Admitting: Internal Medicine

## 2020-05-12 VITALS — BP 114/76 | HR 61 | Temp 97.7°F | Resp 16 | Ht 65.0 in | Wt 138.2 lb

## 2020-05-12 DIAGNOSIS — I10 Essential (primary) hypertension: Secondary | ICD-10-CM | POA: Diagnosis not present

## 2020-05-12 DIAGNOSIS — E782 Mixed hyperlipidemia: Secondary | ICD-10-CM

## 2020-05-12 DIAGNOSIS — I700 Atherosclerosis of aorta: Secondary | ICD-10-CM

## 2020-05-12 DIAGNOSIS — R7309 Other abnormal glucose: Secondary | ICD-10-CM

## 2020-05-12 DIAGNOSIS — E559 Vitamin D deficiency, unspecified: Secondary | ICD-10-CM | POA: Diagnosis not present

## 2020-05-12 DIAGNOSIS — Z79899 Other long term (current) drug therapy: Secondary | ICD-10-CM | POA: Diagnosis not present

## 2020-05-12 DIAGNOSIS — I25118 Atherosclerotic heart disease of native coronary artery with other forms of angina pectoris: Secondary | ICD-10-CM

## 2020-05-12 DIAGNOSIS — Z9989 Dependence on other enabling machines and devices: Secondary | ICD-10-CM | POA: Diagnosis not present

## 2020-05-12 DIAGNOSIS — G4733 Obstructive sleep apnea (adult) (pediatric): Secondary | ICD-10-CM | POA: Diagnosis not present

## 2020-05-12 NOTE — Telephone Encounter (Signed)
Spoke with Jasmine Reeves, doing better with her compression fracture still wearing the brace.  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  May require surgery if they have grown.  CAller is in good spirits about her health situation and her husbands.  Has a strong faith base. Encouraged to call anytime she needs to talk or has questions. Zareena Willis,BSN,RN3,CCM,CN

## 2020-05-13 ENCOUNTER — Ambulatory Visit (INDEPENDENT_AMBULATORY_CARE_PROVIDER_SITE_OTHER): Payer: Medicare PPO

## 2020-05-13 ENCOUNTER — Encounter: Payer: Self-pay | Admitting: Orthopaedic Surgery

## 2020-05-13 ENCOUNTER — Ambulatory Visit: Payer: Medicare PPO | Admitting: Orthopaedic Surgery

## 2020-05-13 VITALS — BP 143/80 | HR 76 | Ht 65.0 in | Wt 138.2 lb

## 2020-05-13 DIAGNOSIS — S22080A Wedge compression fracture of T11-T12 vertebra, initial encounter for closed fracture: Secondary | ICD-10-CM | POA: Diagnosis not present

## 2020-05-13 LAB — CBC WITH DIFFERENTIAL/PLATELET
Absolute Monocytes: 555 cells/uL (ref 200–950)
Basophils Absolute: 53 cells/uL (ref 0–200)
Basophils Relative: 0.9 %
Eosinophils Absolute: 171 cells/uL (ref 15–500)
Eosinophils Relative: 2.9 %
HCT: 37.2 % (ref 35.0–45.0)
Hemoglobin: 12.4 g/dL (ref 11.7–15.5)
Lymphs Abs: 1457 cells/uL (ref 850–3900)
MCH: 30.5 pg (ref 27.0–33.0)
MCHC: 33.3 g/dL (ref 32.0–36.0)
MCV: 91.6 fL (ref 80.0–100.0)
MPV: 9.6 fL (ref 7.5–12.5)
Monocytes Relative: 9.4 %
Neutro Abs: 3664 cells/uL (ref 1500–7800)
Neutrophils Relative %: 62.1 %
Platelets: 192 10*3/uL (ref 140–400)
RBC: 4.06 10*6/uL (ref 3.80–5.10)
RDW: 12 % (ref 11.0–15.0)
Total Lymphocyte: 24.7 %
WBC: 5.9 10*3/uL (ref 3.8–10.8)

## 2020-05-13 LAB — LIPID PANEL
Cholesterol: 151 mg/dL (ref <200)
HDL: 65 mg/dL (ref >=50)
LDL Cholesterol (Calc): 68 mg/dL (calc)
Non-HDL Cholesterol (Calc): 86 mg/dL (calc) (ref <130)
Total CHOL/HDL Ratio: 2.3 (calc) (ref <5.0)
Triglycerides: 96 mg/dL (ref <150)

## 2020-05-13 LAB — COMPLETE METABOLIC PANEL WITH GFR
AG Ratio: 2.4 (calc) (ref 1.0–2.5)
ALT: 17 U/L (ref 6–29)
AST: 20 U/L (ref 10–35)
Albumin: 4.5 g/dL (ref 3.6–5.1)
Alkaline phosphatase (APISO): 71 U/L (ref 37–153)
BUN: 21 mg/dL (ref 7–25)
CO2: 26 mmol/L (ref 20–32)
Calcium: 9.7 mg/dL (ref 8.6–10.4)
Chloride: 105 mmol/L (ref 98–110)
Creat: 0.75 mg/dL (ref 0.60–0.93)
GFR, Est African American: 89 mL/min/{1.73_m2} (ref >=60)
GFR, Est Non African American: 77 mL/min/{1.73_m2} (ref >=60)
Globulin: 1.9 g/dL (calc) (ref 1.9–3.7)
Glucose, Bld: 98 mg/dL (ref 65–99)
Potassium: 4.2 mmol/L (ref 3.5–5.3)
Sodium: 140 mmol/L (ref 135–146)
Total Bilirubin: 0.6 mg/dL (ref 0.2–1.2)
Total Protein: 6.4 g/dL (ref 6.1–8.1)

## 2020-05-13 LAB — INSULIN, RANDOM: Insulin: 11.8 u[IU]/mL

## 2020-05-13 LAB — HEMOGLOBIN A1C
Hgb A1c MFr Bld: 5.8 % of total Hgb — ABNORMAL HIGH (ref <5.7)
Mean Plasma Glucose: 120 mg/dL
eAG (mmol/L): 6.6 mmol/L

## 2020-05-13 LAB — TSH: TSH: 4.84 mIU/L — ABNORMAL HIGH (ref 0.40–4.50)

## 2020-05-13 LAB — MAGNESIUM: Magnesium: 2.3 mg/dL (ref 1.5–2.5)

## 2020-05-13 LAB — VITAMIN D 25 HYDROXY (VIT D DEFICIENCY, FRACTURES): Vit D, 25-Hydroxy: 79 ng/mL (ref 30–100)

## 2020-05-13 MED ORDER — ALENDRONATE SODIUM 70 MG PO TABS: 70.0000 mg | ORAL_TABLET | ORAL | 4 refills | Status: DC

## 2020-05-13 NOTE — Progress Notes (Unsigned)
Office Visit Note   Patient: Jasmine Reeves           Date of Birth: Jun 13, 1940           MRN: [id number] Visit Date: 05/13/2020              Requested by: [name], MD [address] [place],  [place] [address] PCP: [name], MD   Assessment & Plan: Visit Diagnoses:  1. Compression fracture of T12 vertebra, initial encounter [facility])     Plan: Patient is concerned about her posture.  We discussed options for treatment, including kyphoplasty and instrumented fusion.  Pluses and minuses for all those types of treatments were discussed in detail.  Patient is not interested in surgical treatment at this time and pain is being handled well.  Recheck 2 months single spot lateral x-ray on return.  Follow-Up Instructions: No follow-ups on file.   Orders:  Orders Placed This Encounter  Procedures  . XR Lumbar Spine 2-3 Views   No orders of the defined types were placed in this encounter.     Procedures: No procedures performed   Clinical Data: No additional findings.   Subjective: Chief Complaint  Patient presents with  . Lower Back - Follow-up    T12 Compression fx    HPI 1 month follow-up T12 compression fracture.  No cauda equina symptoms.  No neurogenic claudication symptoms.  Patient is on calcium and vitamin D next bone density test November.  She was having to help her husband with lifting and transferring when her injury occurred.  No definite fall.  Review of Systems 14 point update unchanged from 04/15/2020 office visit.   Objective: Vital Signs: BP (!) 143/80 (BP Location: Left Arm, Patient Position: Sitting)   Pulse 76   Ht 5\' 5"  (1.651 m)   Wt 138 lb 3.2 oz (62.7 kg)   BMI 23.00 kg/m   Physical Exam Constitutional:      Appearance: She is well-developed.  HENT:     Head: Normocephalic.     Right Ear: External ear normal.     Left Ear: External ear normal.  Eyes:     Pupils: Pupils are equal, round, and reactive to  light.  Neck:     Thyroid: No thyromegaly.     Trachea: No tracheal deviation.  Cardiovascular:     Rate and Rhythm: Normal rate.  Pulmonary:     Effort: Pulmonary effort is normal.  Abdominal:     Palpations: Abdomen is soft.  Skin:    General: Skin is warm and dry.  Neurological:     Mental Status: She is alert and oriented to person, place, and time.  Psychiatric:        Behavior: Behavior normal.     Ortho Exam some thoracic kyphosis no scoliosis.  Some tenderness at the lumbosacral junction.  Anterior tib EHL and gait sequence is normal.  Specialty Comments:  No specialty comments available.  Imaging: No results found.   PMFS History: Patient Active Problem List   Diagnosis Date Noted  . Aortic atherosclerosis (HCC) by Abd CT scan on 02/16/2020 05/11/2020  . 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 Compression fracture of T12 vertebra (HCC) 02/21/2020  . Cystocele with rectocele 12/13/2018  . 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 BMI 24.0-24.9, adult 11/01/2017  . Osteoporosis with current pathological fracture 04/12/2017  . OSA on CPAP 11/27/2014  . IBS (irritable bowel syndrome) 11/11/2014  .  Encounter for Medicare annual wellness exam 11/11/2014  . Medication management 04/12/2014  . Abnormal blood sugar 10/10/2013  . Mixed hyperlipidemia 10/08/2013  . CAD (coronary artery disease) 02/03/2013  . MVP (mitral valve prolapse) 02/03/2013  . Vitamin D deficiency   . GERD (gastroesophageal reflux disease)   . COPD (chronic obstructive pulmonary disease) (HCC) 02/11/2010  . Essential hypertension 01/28/2010  . RAYNAUDS SYNDROME 01/28/2010   Past Medical History:  Diagnosis Date  . COPD (chronic obstructive pulmonary disease) (HCC)   . Fibromyalgia   . GERD (gastroesophageal reflux disease)   . IBS (irritable bowel syndrome)   . Scoliosis     Family History  Problem Relation Age of Onset  . Heart disease Brother   .  Hyperlipidemia Brother     Past Surgical History:  Procedure Laterality Date  . ABDOMINAL HYSTERECTOMY    . APPENDECTOMY    . CORONARY ANGIOPLASTY  12/11  . CORONARY ARTERY BYPASS GRAFT    . CYSTOCELE REPAIR  03/12/2019   cystocele and rectocele repair, Dr. Jennette Kettle  . ESOPHAGOGASTRODUODENOSCOPY  07/21/12   polyps, Hiatal hernia Dr. Matthias Hughs  . TONSILLECTOMY    . TUBAL LIGATION     Social History   Occupational History  . Not on file  Tobacco Use  . Smoking status: Former Smoker    Quit date: 09/03/1965    Years since quitting: 54.7  . Smokeless tobacco: Never Used  Substance and Sexual Activity  . Alcohol use: Yes    Alcohol/week: 3.0 standard drinks    Types: 3 Standard drinks or equivalent per week    Comment: rare  . Drug use: No  . Sexual activity: Not on file

## 2020-05-13 NOTE — Progress Notes (Signed)
============================================================ -   Test results slightly outside the reference range are not unusual. If there is anything important, I will review this with you,  otherwise it is considered normal test values.  If you have further questions,  please do not hesitate to contact me at the office or via My Chart.  ============================================================ ============================================================  -  Total Chol = 151 and LDL 68 - Both  Excellent   - Very low risk for Heart Attack  / Stroke ============================================================ ============================================================  -  TSH (Thyroid test ) is slightly abnormal, but not enough to recommend  starting a thyroid medicine at this point - we will continue to monitor closely & recheck at your June Office Visit   Also, Please be absolutely sure that you are not taking any supplements  with "Biotin" in it as Biotin can cause false elevations                                                                          in the TSH measurements  ============================================================ ============================================================  -  A1c = 12 week average Blood Sugar is slightly elevated, So . . . . . Marland Kitchen  - Avoid Sweets, Candy & White Stuff   - Rice, Potatoes, Breads &  Pasta ============================================================ ============================================================  -  Vitamin D = 79 - Excellent  ============================================================ ============================================================  -  All Else - CBC - Kidneys - Electrolytes - Liver & Magnesium   - all  Normal / OK ============================================================ ============================================================  -  Keep up the [place] Work  !    ============================================================ ============================================================

## 2020-05-14 ENCOUNTER — Ambulatory Visit: Payer: Medicare PPO | Admitting: Adult Health

## 2020-05-28 DIAGNOSIS — H903 Sensorineural hearing loss, bilateral: Secondary | ICD-10-CM | POA: Diagnosis not present

## 2020-06-26 ENCOUNTER — Other Ambulatory Visit: Payer: Self-pay | Admitting: Adult Health

## 2020-06-26 ENCOUNTER — Other Ambulatory Visit: Payer: Self-pay | Admitting: Internal Medicine

## 2020-06-26 DIAGNOSIS — E782 Mixed hyperlipidemia: Secondary | ICD-10-CM

## 2020-06-26 DIAGNOSIS — I10 Essential (primary) hypertension: Secondary | ICD-10-CM

## 2020-07-16 DIAGNOSIS — N819 Female genital prolapse, unspecified: Secondary | ICD-10-CM | POA: Diagnosis not present

## 2020-07-16 DIAGNOSIS — R102 Pelvic and perineal pain: Secondary | ICD-10-CM | POA: Diagnosis not present

## 2020-07-16 DIAGNOSIS — N3946 Mixed incontinence: Secondary | ICD-10-CM | POA: Diagnosis not present

## 2020-07-16 DIAGNOSIS — N952 Postmenopausal atrophic vaginitis: Secondary | ICD-10-CM | POA: Diagnosis not present

## 2020-07-16 DIAGNOSIS — R198 Other specified symptoms and signs involving the digestive system and abdomen: Secondary | ICD-10-CM | POA: Diagnosis not present

## 2020-07-18 ENCOUNTER — Ambulatory Visit: Payer: Medicare PPO | Admitting: Orthopaedic Surgery

## 2020-08-01 ENCOUNTER — Ambulatory Visit: Payer: Medicare PPO | Admitting: Orthopaedic Surgery

## 2020-08-05 ENCOUNTER — Other Ambulatory Visit: Payer: Self-pay | Admitting: Internal Medicine

## 2020-08-06 ENCOUNTER — Other Ambulatory Visit: Payer: Self-pay

## 2020-08-06 ENCOUNTER — Encounter: Payer: Self-pay | Admitting: Orthopaedic Surgery

## 2020-08-06 ENCOUNTER — Ambulatory Visit (INDEPENDENT_AMBULATORY_CARE_PROVIDER_SITE_OTHER): Payer: Medicare PPO | Admitting: Orthopaedic Surgery

## 2020-08-06 ENCOUNTER — Ambulatory Visit (INDEPENDENT_AMBULATORY_CARE_PROVIDER_SITE_OTHER): Payer: Medicare PPO

## 2020-08-06 VITALS — BP 125/67 | HR 74 | Ht 65.0 in | Wt 137.0 lb

## 2020-08-06 DIAGNOSIS — S22080A Wedge compression fracture of T11-T12 vertebra, initial encounter for closed fracture: Secondary | ICD-10-CM | POA: Diagnosis not present

## 2020-08-06 NOTE — Progress Notes (Signed)
Office Visit Note   Patient: Jasmine Reeves           Date of Birth: 1940/06/18           MRN: [id number] Visit Date: 08/06/2020              Requested by: [name], MD [address] [place],  [place] [address] PCP: [name], MD   Assessment & Plan: Visit Diagnoses:  1. Compression fracture of T12 vertebra, initial encounter (HCC)     Plan: We discussed core strengthening exercises weightbearing exercises with daily walking.  She can look at either Pilates, Silver sneakers stretching activities, yoga etc.  She is due for new bone density test in October.  We discussed once again how to take Fosamax with glass of water sitting upright for 30 minutes before she takes other medication or food.  Her compression fracture is healed.  Recheck in 12 months.  We discussed taking the Fosamax for 18 months, skipping for 6 months and then repeat bone density test if needed.  Follow-Up Instructions: No follow-ups on file.   Orders:  Orders Placed This Encounter  Procedures  . XR Lumbar Spine 2-3 Views   No orders of the defined types were placed in this encounter.     Procedures: No procedures performed   Clinical Data: No additional findings.   Subjective: Chief Complaint  Patient presents with  . Lower Back - Fracture, Follow-up    HPI 80 year old female with osteopenia by previous bone density test almost 2 years ago.  She has noted improvement since onset of pain in December.  She does have some pain in her right lateral hip lateral iliotibial band region down to her knee but has significant scoliosis on plain radiographs from previous CT scan of the abdomen and pelvis had significant foraminal stenosis on the right at L L4 and also L5 level.  Patient in the past to take an prednisone short courses then would get relief of her lateral thigh symptoms.  Patient ultimately had compression fracture which were following.  She is walking sometimes first  use a cane she states it makes her feel a bit more stable but she walks in the house rapidly without any walking aids.  She got a prescription for Fosamax but has not restarted it.  She used to be on it in the past.  She is on calcium and vitamin D.  All other systems updated unchanged.  Of note is diabetes last A1c 5.8.  Review of Systems 14 point update unchanged.   Objective: Vital Signs: BP 125/67   Pulse 74   Ht 5\' 5"  (1.651 m)   Wt 137 lb (62.1 kg)   BMI 22.80 kg/m   Physical Exam Constitutional:      Appearance: She is well-developed.  HENT:     Head: Normocephalic.     Right Ear: External ear normal.     Left Ear: External ear normal.  Eyes:     Pupils: Pupils are equal, round, and reactive to light.  Neck:     Thyroid: No thyromegaly.     Trachea: No tracheal deviation.  Cardiovascular:     Rate and Rhythm: Normal rate.  Pulmonary:     Effort: Pulmonary effort is normal.  Abdominal:     Palpations: Abdomen is soft.  Skin:    General: Skin is warm and dry.  Neurological:     Mental Status: She is alert and oriented to person,  place, and time.  Psychiatric:        Behavior: Behavior normal.     Ortho Exam no hip flexion weakness.  She has some tenderness over trochanter.  Some sciatic notch tenderness more in the right than left.  She is able ambulate back-and-forth across the exam room comfortably without limping. Specialty Comments:  No specialty comments available.  Imaging: No results found.   PMFS History: Patient Active Problem List   Diagnosis Date Noted  . Aortic atherosclerosis (HCC) by Abd CT scan on 02/16/2020 05/11/2020  . 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 Compression fracture of T12 vertebra (HCC) 02/21/2020  . Cystocele with rectocele 12/13/2018  . 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 BMI 24.0-24.9, adult 11/01/2017  . Osteoporosis with current pathological fracture 04/12/2017  . OSA on CPAP  11/27/2014  . IBS (irritable bowel syndrome) 11/11/2014  . Encounter for Medicare annual wellness exam 11/11/2014  . Medication management 04/12/2014  . Abnormal blood sugar 10/10/2013  . Mixed hyperlipidemia 10/08/2013  . CAD (coronary artery disease) 02/03/2013  . MVP (mitral valve prolapse) 02/03/2013  . Vitamin D deficiency   . GERD (gastroesophageal reflux disease)   . COPD (chronic obstructive pulmonary disease) (HCC) 02/11/2010  . Essential hypertension 01/28/2010  . RAYNAUDS SYNDROME 01/28/2010   Past Medical History:  Diagnosis Date  . COPD (chronic obstructive pulmonary disease) (HCC)   . Fibromyalgia   . GERD (gastroesophageal reflux disease)   . IBS (irritable bowel syndrome)   . Scoliosis     Family History  Problem Relation Age of Onset  . Heart disease Brother   . Hyperlipidemia Brother     Past Surgical History:  Procedure Laterality Date  . ABDOMINAL HYSTERECTOMY    . APPENDECTOMY    . CORONARY ANGIOPLASTY  12/11  . CORONARY ARTERY BYPASS GRAFT    . CYSTOCELE REPAIR  03/12/2019   cystocele and rectocele repair, Dr. Jennette Kettle  . ESOPHAGOGASTRODUODENOSCOPY  07/21/12   polyps, Hiatal hernia Dr. Matthias Hughs  . TONSILLECTOMY    . TUBAL LIGATION     Social History   Occupational History  . Not on file  Tobacco Use  . Smoking status: Former Smoker    Quit date: 09/03/1965    Years since quitting: 54.9  . Smokeless tobacco: Never Used  Substance and Sexual Activity  . Alcohol use: Yes    Alcohol/week: 3.0 standard drinks    Types: 3 Standard drinks or equivalent per week    Comment: rare  . Drug use: No  . Sexual activity: Not on file

## 2020-08-14 ENCOUNTER — Other Ambulatory Visit: Payer: Self-pay

## 2020-08-14 ENCOUNTER — Ambulatory Visit: Payer: Medicare PPO | Admitting: Adult Health

## 2020-08-14 ENCOUNTER — Encounter: Payer: Self-pay | Admitting: Adult Health

## 2020-08-14 VITALS — BP 104/62 | HR 63 | Temp 97.5°F | Ht 65.0 in | Wt 140.0 lb

## 2020-08-14 DIAGNOSIS — I341 Nonrheumatic mitral (valve) prolapse: Secondary | ICD-10-CM | POA: Diagnosis not present

## 2020-08-14 DIAGNOSIS — I10 Essential (primary) hypertension: Secondary | ICD-10-CM

## 2020-08-14 DIAGNOSIS — J449 Chronic obstructive pulmonary disease, unspecified: Secondary | ICD-10-CM

## 2020-08-14 DIAGNOSIS — Z79899 Other long term (current) drug therapy: Secondary | ICD-10-CM

## 2020-08-14 DIAGNOSIS — K589 Irritable bowel syndrome without diarrhea: Secondary | ICD-10-CM

## 2020-08-14 DIAGNOSIS — I7300 Raynaud's syndrome without gangrene: Secondary | ICD-10-CM | POA: Diagnosis not present

## 2020-08-14 DIAGNOSIS — N8110 Cystocele, unspecified: Secondary | ICD-10-CM

## 2020-08-14 DIAGNOSIS — R6889 Other general symptoms and signs: Secondary | ICD-10-CM | POA: Diagnosis not present

## 2020-08-14 DIAGNOSIS — K862 Cyst of pancreas: Secondary | ICD-10-CM

## 2020-08-14 DIAGNOSIS — I25118 Atherosclerotic heart disease of native coronary artery with other forms of angina pectoris: Secondary | ICD-10-CM | POA: Diagnosis not present

## 2020-08-14 DIAGNOSIS — R7309 Other abnormal glucose: Secondary | ICD-10-CM | POA: Diagnosis not present

## 2020-08-14 DIAGNOSIS — E559 Vitamin D deficiency, unspecified: Secondary | ICD-10-CM

## 2020-08-14 DIAGNOSIS — S22080K Wedge compression fracture of T11-T12 vertebra, subsequent encounter for fracture with nonunion: Secondary | ICD-10-CM

## 2020-08-14 DIAGNOSIS — M8000XA Age-related osteoporosis with current pathological fracture, unspecified site, initial encounter for fracture: Secondary | ICD-10-CM

## 2020-08-14 DIAGNOSIS — G4733 Obstructive sleep apnea (adult) (pediatric): Secondary | ICD-10-CM | POA: Diagnosis not present

## 2020-08-14 DIAGNOSIS — E782 Mixed hyperlipidemia: Secondary | ICD-10-CM | POA: Diagnosis not present

## 2020-08-14 DIAGNOSIS — Z6823 Body mass index (BMI) 23.0-23.9, adult: Secondary | ICD-10-CM

## 2020-08-14 DIAGNOSIS — Z0000 Encounter for general adult medical examination without abnormal findings: Secondary | ICD-10-CM

## 2020-08-14 DIAGNOSIS — Z0001 Encounter for general adult medical examination with abnormal findings: Secondary | ICD-10-CM

## 2020-08-14 DIAGNOSIS — I700 Atherosclerosis of aorta: Secondary | ICD-10-CM

## 2020-08-14 DIAGNOSIS — K219 Gastro-esophageal reflux disease without esophagitis: Secondary | ICD-10-CM

## 2020-08-14 NOTE — Progress Notes (Signed)
MEDICARE ANNUAL WELLNESS VISIT AND FOLLOW UP  Assessment:   Diagnoses and all orders for this visit:  Encounter for Medicare annual wellness exam Due annually   Atherosclerosis of aorta Per CT 2018 Control blood pressure, cholesterol, glucose, increase exercise.   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  Prescribed nitroglycerine without recent use Followed by cardiology  Essential hypertension At goal; continue medications Monitor blood pressure at home; call if consistently over 130/80 Continue DASH diet.   Reminder to go to the ER if any CP, SOB, nausea, dizziness, severe HA, changes vision/speech, left arm numbness and tingling and jaw pain.  MVP (mitral valve prolapse) Continue BB, monitor.   Raynaud's disease without gangrene No complications, Monitor  Chronic obstructive pulmonary disease, unspecified COPD type (HCC) By imaging; denies notable symptoms of dyspnea, secretions, continue to monitor. Remote hx of smoking.   OSA on CPAP Reports typically wears 100% of the time and endorses restorative sleep  Gastroesophageal reflux disease, esophagitis presence not specified Taking PPI BID, H2i didn't help; manages with carafate Encouraged follow up with GI to address need for high dose PPI, may need EGD Discussed diet, avoiding triggers and other lifestyle changes  Abnormal blood sugar Has been prediabetic; most recent A1C at goal Discussed disease and risks Discussed diet/exercise, weight management   Medication management -     CBC with Differential/Platelet -     CMP/GFR -     Magnesium   Mixed hyperlipidemia At goal; continue medication Continue low cholesterol diet and exercise.  -     Lipid panel -     TSH  Vitamin D deficiency At goal at recent check; continue to recommend supplementation for goal of  70-100 Defer vitamin D level  Thoracic compression facture with non-union [facility](HCC) Dr. Ophelia CharterYates following; see notes for Tyson Babinskiostepenia, DEXA She will reach out to ortho for PT for core strengthening   Osteopenia of left forearm DEXA 2020, followed by Dr. Jennette KettleNeal, due 11/2018, has restarted fosamax Continue dietary calcium, vitamin D supplement, recommend weight bearing exercises  Pancreas cyst [facility] surg onc following with serial imaging Low suspicion of cancer per patient; denies concerning sx today   Cystocele/rectocele S/p repair by Dr. Jennette KettleNeal with recurrent prolapse; now follows with Dr. Deno Etiennehu and doing well with pessary; discussed pelvic floor therapy; will follow up with ury/gyn   Over 40 minutes of exam, counseling, chart review and critical decision making was performed Future Appointments  Date Time Provider Department Center  11/17/2020 11:00 AM [name], [name], MD [facility] None  08/11/2021  2:00 PM Eldred MangesYates, Mark C, MD [facility] None  08/17/2021  2:00 PM Judd Gaudierorbett, Becky Berberian, NP [facility] None     Plan:   During the course of the visit the patient was educated and counseled about appropriate screening and preventive services including:   Pneumococcal vaccine  Prevnar 13 Influenza vaccine Td vaccine Screening electrocardiogram Bone densitometry screening Colorectal cancer screening Diabetes screening Glaucoma screening Nutrition counseling  Advanced directives: requested   Subjective:  Jasmine Reeves is a 80 y.o. female who presents for Medicare Annual Wellness Visit and 3 month follow up.    Follows annually with Dr. Jennette KettleNeal for mammograms and PAPs. Had known cystocele/rectocele, recently underwent surgical repair 03/12/2019 but unfortunately with repeated prolapse, [facility] Louis Mattehristin Chu, was fitted for pessary and reports this is working well. She continues with  myrbetriq with benefit.   The Abdominal CT in Dec 2021 also  showed 2 cysts in  the pancreatic tail, patient was seen by Dr. Whitney Post, [facility] surgical oncologist and had an Abd MRI which showed 1 cyst - 1.6 cm. and recommendations were to "Reimage q71mo x 4, then q1y x 2, then q2y x 3 -STOP if stable over 10 years ".  Patient had compression fracture T12 in 01/2020, has seen Dr. 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 She has follow up DEXA planning later this year.   she has a diagnosis of GERD which is currently managed by protonix 40 mg BID, carafate PRN due to breathrough, has followed up with Dr. Matthias Hughs.   BMI is Body mass index is 23.3 kg/m., she has been working on diet, no exercise recently as she recovers from cystocele surgery other than walking loops in the home.  Wt Readings from Last 3 Encounters:  08/14/20 140 lb (63.5 kg)  08/06/20 137 lb (62.1 kg)  05/13/20 138 lb 3.2 oz (62.7 kg)   She has hx of PCA x 2 In Nov & Dec 2001, and CABG in Jan 2002. , she underwent CABG. Had reassuring myocardial perfusion and ECHO in 12/2018 other than known MVP and mild aortic stenosis Continues to follow up with Dr. Tresa Endo.  She has aortic atherosclerosis and COPD per CXR in 2018/2019  Her blood pressure has been controlled at home, today their BP is BP: 104/62 She does not workout. She denies chest pain, shortness of breath, dizziness.   She is on cholesterol medication (vytorin 10/20) and denies myalgias. Her cholesterol is at goal. The cholesterol last visit was:   Lab Results  Component Value Date   CHOL 151 05/12/2020   HDL 65 05/12/2020   LDLCALC 68 05/12/2020   TRIG 96 05/12/2020   CHOLHDL 2.3 05/12/2020    She has been working on diet and exercise for glucose management, and denies increased appetite, nausea, paresthesia of the feet, polydipsia, polyuria, visual disturbances, vomiting and weight loss. 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     CALCIUM CITRATE PO Take 500 mg by mouth daily.     Cholecalciferol (VITAMIN D) 2000 UNITS tablet Take 2,000 Units by mouth 2 (two) times daily.     diltiazem (DILT-XR) 180 MG 24 hr capsule TAKE 1 CAPSULE BY MOUTH DAILY 90 capsule 3   docusate sodium (COLACE) 100 MG capsule Take 1 capsule (100 mg total) by mouth every 12 (twelve) hours. 60 capsule 0   ezetimibe-simvastatin (VYTORIN) 10-20 MG tablet TAKE 1 TABLET BY MOUTH DAILY FOR CHOLESTEROL 90 tablet 1   fexofenadine (ALLEGRA) 180 MG tablet Take 180 mg by mouth daily.     fluticasone (VERAMYST) 27.5 MCG/SPRAY nasal spray Place 2 sprays into the nose daily as needed for rhinitis. 10 g 8   IRON PO Take 50 mg by mouth daily.     labetalol (NORMODYNE) 100 MG tablet TAKE 1 TABLET(100  MG) BY MOUTH AT BEDTIME 90 tablet 3   Magnesium 400 MG CAPS Take by mouth daily.     meloxicam (MOBIC) 15 MG tablet Take 1/2 to 1 tablet Daily with Food for Pain & Inflammation 90 tablet 3   methocarbamol (ROBAXIN) 500 MG tablet Take 1 tablet (500 mg total) by mouth every 8 (eight) hours as needed. For muscle spasms. Do not take and drive. 60 tablet 0   Multiple Vitamins-Minerals (MULTIVITAMIN WITH MINERALS) tablet Take 1 tablet by mouth daily.     MYRBETRIQ 50 MG TB24 tablet TAKE 1 TABLET BY MOUTH DAILY FOR BLADDER 90 tablet 1   nitroGLYCERIN (NITROSTAT) 0.4 MG SL tablet Place 1 tablet (0.4 mg total) under the tongue every 5 (five) minutes as needed for chest pain. 25 tablet 12   pantoprazole (PROTONIX) 40 MG tablet TAKE 1 TABLET BY MOUTH TWICE DAILY FOR ACID INDIGESTION AND REFLUX 180 tablet 3   sucralfate (CARAFATE) 1 g  tablet TAKE 1 TABLET(1 GRAM) BY MOUTH FOUR TIMES DAILY 360 tablet 0   triamcinolone ointment (KENALOG) 0.1 % Apply 1 application topically 2 (two) times daily. 80 g 1   vitamin C (ASCORBIC ACID) 500 MG tablet Take 500 mg by mouth daily.     Zinc 50 MG TABS Take by mouth daily.     alendronate (FOSAMAX) 70 MG tablet Take 1 tablet (70 mg total) by mouth once a week. Take with a full glass of water on an empty stomach. (Patient not taking: Reported on 08/14/2020) 12 tablet 4   traMADol (ULTRAM) 50 MG tablet Take 1/2-1 tab every 4 hours as needed for severe pain. Use tylenol/ibuprofen for mild-mod pain. (Patient not taking: No sig reported) 30 tablet 0   No current facility-administered medications on file prior to visit.    Allergies  Allergen Reactions   Altace [Ramipril]     cough   Azithromycin     palpitation   Celebrex [Celecoxib]     Gi upset    Hydrochlorothiazide     cramping   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   Prilosec [Omeprazole]     constipation   Tape Other (See Comments)   Tapentadol     Other reaction(s): Other (See Comments), redness & itch    Current Problems (verified) Patient Active Problem List   Diagnosis Date Noted   Aortic atherosclerosis (HCC) by Abd CT scan on 02/16/2020 05/11/2020   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   Compression fracture of T12 vertebra (HCC) 02/21/2020   Cystocele with rectocele 12/13/2018   BMI 23.0-23.9, adult 11/01/2017   Osteoporosis with current pathological fracture 04/12/2017   OSA on CPAP 11/27/2014   IBS (irritable bowel syndrome) 11/11/2014   Encounter for Medicare annual wellness exam 11/11/2014   Medication management 04/12/2014   Abnormal blood sugar 10/10/2013   Mixed hyperlipidemia 10/08/2013   CAD (coronary artery disease) 02/03/2013   MVP (mitral valve prolapse) 02/03/2013   Vitamin D deficiency    GERD (gastroesophageal reflux disease)    COPD (chronic obstructive pulmonary disease)  (HCC) 02/11/2010   Essential hypertension 01/28/2010   RAYNAUDS SYNDROME 01/28/2010    Screening Tests Immunization History  Administered Date(s) Administered   Influenza, High Dose Seasonal PF 11/11/2014, 12/08/2015   Influenza,inj,Quad PF,6+ Mos 11/27/2016, 11/20/2017, 11/14/2018   Influenza-Unspecified 11/20/2019   PFIZER(Purple Top)SARS-COV-2 Vaccination 04/03/2019, 04/26/2019   Pneumococcal Conjugate-13 04/16/2014   Pneumococcal-Unspecified 03/01/1998, 03/12/2010   Td 03/27/2012   Tdap 02/10/2018   Zoster,  Live 03/02/2007   Preventative care: Last colonoscopy: 12/2014 Dr. Matthias Hughs 5 year follow up due 12/2019, was advised was postpone 2 years per new guidelines, due 2023  Last mammogram: 11/2018 at Dr. Donnetta Hail office, will get this year Last pap smear/pelvic exam: 2020 (Dr. Jennette Kettle)  DEXA: 11/2018 gets at Dr. Donnetta Hail office, forearm osteoporosis, hip osteopenia, will schedule 11/2020- will be starting fosamax   CXR 03/2017 Stress test 11/2018 Echo 12/2018 Cath 2008  Prior vaccinations: TD or Tdap: 2019  Influenza: 10/2019 Pneumococcal: 2012 Prevnar 13: 2016 Shingles/Zostavax: 2009, check with insurance and get at pharmacy  Covid 19 2/2 2021 + booster  Names of Other Physician/Practitioners you currently use: 1. [facility] here for primary care 2. Dr. Dione Booze, eye doctor, last visit 2022 3. [facility], dentist, last visit 2022, goes q 6 months 4. Dr. Andi Devon, derm, last 2022, annually   Patient Care Team: [name], MD as PCP - General (Internal Medicine) Lennette Bihari, MD as Consulting Physician (Cardiology) Bernette Redbird, MD as Consulting Physician (Gastroenterology) Freddy Finner, MD as Consulting Physician (Obstetrics and Gynecology)  SURGICAL HISTORY She  has a past surgical history that includes Tonsillectomy; Appendectomy; Abdominal hysterectomy; Tubal ligation; Coronary artery bypass graft; Coronary angioplasty  (12/11); Esophagogastroduodenoscopy (07/21/12); and Cystocele repair (03/12/2019). FAMILY HISTORY Her family history includes Heart disease in her brother; Hyperlipidemia in her brother. SOCIAL HISTORY She  reports that she quit smoking about 54 years ago. She has never used smokeless tobacco. She reports current alcohol use of about 3.0 standard drinks of alcohol per week. She reports that she does not use drugs.   MEDICARE WELLNESS OBJECTIVES: Physical activity: Current Exercise Habits: The patient does not participate in regular exercise at present, Exercise limited by: orthopedic condition(s) Cardiac risk factors: Cardiac Risk Factors include: advanced age (>26men, >10 women);dyslipidemia;hypertension;sedentary lifestyle Depression/mood screen:   Depression screen [facility] 2/9 08/14/2020  Decreased Interest 0  Down, Depressed, Hopeless 1  PHQ - 2 Score 1    ADLs:  In your present state of health, do you have any difficulty performing the following activities: 08/14/2020 10/29/2019  Hearing? N N  Vision? N N  Difficulty concentrating or making decisions? N N  Walking or climbing stairs? N N  Dressing or bathing? N N  Doing errands, shopping? N N  Some recent data might be hidden     Cognitive Testing  Alert? Yes  Normal Appearance?Yes  Oriented to person? Yes  Place? Yes   Time? Yes  Recall of three objects?  Yes  Can perform simple calculations? Yes  Displays appropriate judgment?Yes  Can read the correct time from a watch face?Yes  EOL planning: Does Patient Have a Medical Advance Directive?: Yes Type of Advance Directive: Healthcare Power of Attorney, Living will Does patient want to make changes to medical advance directive?: No - Patient declined Copy of Healthcare Power of Attorney in Chart?: No - copy requested  Review of Systems  Constitutional:  Negative for chills, fever, malaise/fatigue and weight loss.  HENT:  Negative for congestion, hearing loss, sore throat and  tinnitus.   Eyes:  Negative for blurred vision and double vision.  Respiratory:  Negative for cough, sputum production, shortness of breath and wheezing.   Cardiovascular:  Negative for chest pain, palpitations, orthopnea, claudication and leg swelling.  Gastrointestinal:  Negative for abdominal pain, blood in stool, constipation, diarrhea, heartburn, melena, nausea and vomiting.  Genitourinary:  Negative for dysuria, frequency (improved), hematuria and urgency.  Perineal discomfort following cystocele surgery  Musculoskeletal:  Positive for back pain (chronic, mid back, compression fracture). Negative for falls, joint pain and myalgias.  Skin:  Negative for rash.  Neurological:  Negative for dizziness, tingling, sensory change, weakness and headaches.  Endo/Heme/Allergies:  Negative for environmental allergies and polydipsia.  Psychiatric/Behavioral: Negative.  Negative for depression, memory loss and substance abuse. The patient is not nervous/anxious and does not have insomnia.   All other systems reviewed and are negative.   Objective:     Today's Vitals   08/14/20 1351  BP: 104/62  Pulse: 63  Temp: (!) 97.5 F (36.4 C)  SpO2: 97%  Weight: 140 lb (63.5 kg)  Height: 5\' 5"  (1.651 m)   Body mass index is 23.3 kg/m.  General appearance: alert, no distress, WD/WN, female HEENT: normocephalic, sclerae anicteric, TMs pearly, nares patent, no discharge or erythema, pharynx normal Oral cavity: MMM, no lesions Neck: supple, no lymphadenopathy, no thyromegaly, no masses Heart: RRR, normal S1, S2, no murmurs Lungs: CTA bilaterally, no wheezes, rhonchi, or rales Abdomen: +bs, soft, rounded, non tender, no masses, no hepatomegaly, no splenomegaly Musculoskeletal: nontender, no swelling, mild kyphoscoliosis Extremities: no edema, no cyanosis, no clubbing Pulses: 2+ symmetric, upper and lower extremities, normal cap refill Neurological: alert, oriented x 3, CN2-12 intact, strength  normal upper extremities and lower extremities, sensation normal throughout, DTRs 2+ throughout, no cerebellar signs, gait normal Psychiatric: normal affect, behavior normal, pleasant   Medicare Attestation I have personally reviewed: The patient's medical and social history Their use of alcohol, tobacco or illicit drugs Their current medications and supplements The patient's functional ability including ADLs,fall risks, home safety risks, cognitive, and hearing and visual impairment Diet and physical activities Evidence for depression or mood disorders  The patient's weight, height, BMI, and visual acuity have been recorded in the chart.  I have made referrals, counseling, and provided education to the patient based on review of the above and I have provided the patient with a written personalized care plan for preventive services.     , NP   08/14/2020

## 2020-08-15 ENCOUNTER — Other Ambulatory Visit: Payer: Self-pay | Admitting: Adult Health

## 2020-08-15 DIAGNOSIS — N289 Disorder of kidney and ureter, unspecified: Secondary | ICD-10-CM

## 2020-08-15 LAB — COMPLETE METABOLIC PANEL WITH GFR
AG Ratio: 2.2 (calc) (ref 1.0–2.5)
ALT: 20 U/L (ref 6–29)
AST: 20 U/L (ref 10–35)
Albumin: 4.3 g/dL (ref 3.6–5.1)
Alkaline phosphatase (APISO): 69 U/L (ref 37–153)
BUN/Creatinine Ratio: 29 (calc) — ABNORMAL HIGH (ref 6–22)
BUN: 27 mg/dL — ABNORMAL HIGH (ref 7–25)
CO2: 28 mmol/L (ref 20–32)
Calcium: 9.3 mg/dL (ref 8.6–10.4)
Chloride: 105 mmol/L (ref 98–110)
Creat: 0.93 mg/dL (ref 0.60–0.93)
GFR, Est African American: 69 mL/min/{1.73_m2} (ref >=60)
GFR, Est Non African American: 59 mL/min/{1.73_m2} — ABNORMAL LOW (ref >=60)
Globulin: 2 g/dL (calc) (ref 1.9–3.7)
Glucose, Bld: 86 mg/dL (ref 65–99)
Potassium: 4.3 mmol/L (ref 3.5–5.3)
Sodium: 141 mmol/L (ref 135–146)
Total Bilirubin: 0.5 mg/dL (ref 0.2–1.2)
Total Protein: 6.3 g/dL (ref 6.1–8.1)

## 2020-08-15 LAB — CBC WITH DIFFERENTIAL/PLATELET
Absolute Monocytes: 475 cells/uL (ref 200–950)
Basophils Absolute: 70 cells/uL (ref 0–200)
Basophils Relative: 1.3 %
Eosinophils Absolute: 243 cells/uL (ref 15–500)
Eosinophils Relative: 4.5 %
HCT: 36.7 % (ref 35.0–45.0)
Hemoglobin: 12.3 g/dL (ref 11.7–15.5)
Lymphs Abs: 1269 cells/uL (ref 850–3900)
MCH: 31.2 pg (ref 27.0–33.0)
MCHC: 33.5 g/dL (ref 32.0–36.0)
MCV: 93.1 fL (ref 80.0–100.0)
MPV: 9.7 fL (ref 7.5–12.5)
Monocytes Relative: 8.8 %
Neutro Abs: 3343 cells/uL (ref 1500–7800)
Neutrophils Relative %: 61.9 %
Platelets: 190 10*3/uL (ref 140–400)
RBC: 3.94 10*6/uL (ref 3.80–5.10)
RDW: 11.9 % (ref 11.0–15.0)
Total Lymphocyte: 23.5 %
WBC: 5.4 10*3/uL (ref 3.8–10.8)

## 2020-08-15 LAB — LIPID PANEL
Cholesterol: 142 mg/dL (ref <200)
HDL: 62 mg/dL (ref >=50)
LDL Cholesterol (Calc): 61 mg/dL (calc)
Non-HDL Cholesterol (Calc): 80 mg/dL (calc) (ref <130)
Total CHOL/HDL Ratio: 2.3 (calc) (ref <5.0)
Triglycerides: 106 mg/dL (ref <150)

## 2020-08-15 LAB — MAGNESIUM: Magnesium: 2.2 mg/dL (ref 1.5–2.5)

## 2020-08-15 LAB — TSH: TSH: 4.12 mIU/L (ref 0.40–4.50)

## 2020-09-02 ENCOUNTER — Other Ambulatory Visit: Payer: Self-pay

## 2020-09-02 ENCOUNTER — Ambulatory Visit (INDEPENDENT_AMBULATORY_CARE_PROVIDER_SITE_OTHER): Payer: Medicare PPO | Admitting: Adult Health

## 2020-09-02 DIAGNOSIS — N289 Disorder of kidney and ureter, unspecified: Secondary | ICD-10-CM

## 2020-09-03 ENCOUNTER — Ambulatory Visit: Payer: Medicare PPO

## 2020-09-03 LAB — BASIC METABOLIC PANEL WITH GFR
BUN: 17 mg/dL (ref 7–25)
CO2: 27 mmol/L (ref 20–32)
Calcium: 9 mg/dL (ref 8.6–10.4)
Chloride: 104 mmol/L (ref 98–110)
Creat: 0.76 mg/dL (ref 0.60–0.93)
GFR, Est African American: 88 mL/min/{1.73_m2} (ref >=60)
GFR, Est Non African American: 76 mL/min/{1.73_m2} (ref >=60)
Glucose, Bld: 85 mg/dL (ref 65–99)
Potassium: 4 mmol/L (ref 3.5–5.3)
Sodium: 141 mmol/L (ref 135–146)

## 2020-09-04 ENCOUNTER — Telehealth: Payer: Self-pay | Admitting: Orthopaedic Surgery

## 2020-09-04 DIAGNOSIS — S22080A Wedge compression fracture of T11-T12 vertebra, initial encounter for closed fracture: Secondary | ICD-10-CM

## 2020-09-04 NOTE — Telephone Encounter (Signed)
OK for referral?  

## 2020-09-04 NOTE — Telephone Encounter (Signed)
Pt called stating she believes she would benefit from PT but forgot to mention it at her last appt. Pt would like a referral and a CB to let her know where the referral was sent.   [phone]

## 2020-09-05 NOTE — Telephone Encounter (Signed)
Referral entered. Patient would like to come to our facility. Advised someone from PT would call her to schedule.

## 2020-09-16 ENCOUNTER — Ambulatory Visit: Payer: Medicare PPO | Admitting: Rehabilitative and Restorative Service Providers"

## 2020-09-17 ENCOUNTER — Ambulatory Visit: Payer: Medicare PPO | Admitting: Rehabilitative and Restorative Service Providers"

## 2020-10-02 ENCOUNTER — Other Ambulatory Visit: Payer: Self-pay

## 2020-10-02 ENCOUNTER — Ambulatory Visit: Payer: Medicare PPO | Admitting: Physical Therapy

## 2020-10-02 ENCOUNTER — Encounter: Payer: Self-pay | Admitting: Physical Therapy

## 2020-10-02 DIAGNOSIS — M6281 Muscle weakness (generalized): Secondary | ICD-10-CM

## 2020-10-02 DIAGNOSIS — G8929 Other chronic pain: Secondary | ICD-10-CM | POA: Diagnosis not present

## 2020-10-02 DIAGNOSIS — M545 Low back pain, unspecified: Secondary | ICD-10-CM | POA: Diagnosis not present

## 2020-10-02 DIAGNOSIS — R2681 Unsteadiness on feet: Secondary | ICD-10-CM

## 2020-10-02 NOTE — Patient Instructions (Signed)
Access Code: [email or web address] URL: [email or web address] Date: 10/02/2020 Prepared by: Moshe Cipro  Exercises Supine Bridge - 2 x daily - 7 x weekly - 10 reps - 1 sets - 5 sec hold Seated Long Arc Quad - 2 x daily - 7 x weekly - 3 sets - 10 reps Standing Hip Extension with Counter Support - 2 x daily - 7 x weekly - 1 sets - 10 reps Standing Hip Abduction with Counter Support - 2 x daily - 7 x weekly - 1 sets - 10 reps

## 2020-10-02 NOTE — Therapy (Signed)
[facility] Physical Therapy [address] [place], [place], [address] Phone: [phone]   Fax:  [phone]  Physical Therapy Evaluation  Patient Details  Name: Jasmine Reeves MRN: [id number] Date of Birth: Jul 16, 1940 Referring Provider (PT): Eldred Manges, MD   Encounter Date: 10/02/2020   Referring diagnosis? S22.080A Treatment diagnosis? (if different than referring diagnosis) M54.50, M62.81, R26.81 What was this (referring dx) caused by? []  Surgery []  Fall [x]  Ongoing issue [x]  Arthritis []  Other: ____________  Laterality: []  Rt []  Lt [x]  Both  Check all possible CPT codes:      []  97110 (Therapeutic Exercise)  []  92507 (SLP Treatment)  []  97112 (Neuro Re-ed)   []  92526 (Swallowing Treatment)   []  97116 (Gait Training)   []  (Cognitive Training, 1st 15 minutes) []  97140 (Manual Therapy)   []  97130 (Cognitive Training, each add'l 15 minutes)  []  97530 (Therapeutic Activities)  []  Other, List CPT Code ____________    []  (Self Care)       [x]  All codes above (97110 - 97535)  []  97012 (Mechanical Traction)  [x]  97014 (E-stim Unattended)  []  97032 (E-stim manual)  []  97033 (Ionto)  [x]  97035 (Ultrasound)  []  97760 (Orthotic Fit) [x]  97750 (Physical Performance Training) []  (Aquatic Therapy) []  97034 (Contrast Bath) []  97018 (Paraffin) []  97597 (Wound Care 1st 20 sq cm) []  97598 (Wound Care each add'l 20 sq cm) []  97016 (Vasopneumatic Device) []  [phone] ) []  (Prosthetic Training)    PT End of Session - 10/02/20 1502     Visit Number 1    Number of Visits 6    Date for PT Re-Evaluation 11/13/20    Authorization Type [employer]    Progress Note Due on Visit 10    PT Start Time 1430    PT Stop Time 1502    PT Time Calculation (min) 32 min    Activity Tolerance Patient tolerated treatment well    Behavior During Therapy WFL for tasks assessed/performed             Past Medical History:  Diagnosis  Date   COPD (chronic obstructive pulmonary disease) (HCC)    Fibromyalgia    GERD (gastroesophageal reflux disease)    IBS (irritable bowel syndrome)    Scoliosis     Past Surgical History:  Procedure Laterality Date   ABDOMINAL HYSTERECTOMY     APPENDECTOMY     CORONARY ANGIOPLASTY  12/11   CORONARY ARTERY BYPASS GRAFT     CYSTOCELE REPAIR  03/12/2019   cystocele and rectocele repair, Dr.   ESOPHAGOGASTRODUODENOSCOPY  07/21/12   polyps, Hiatal hernia Dr.   TONSILLECTOMY     TUBAL LIGATION      There were no vitals filed for this visit.    Subjective Assessment - 10/02/20 1434     Subjective Pt is a 80 y/o female who presents to OPPT for a compression fx of T12 in early Dec 2021.  She reports at that time she was assisting her husband (who has Parkinson's) and developed back pain which ultimately resulted in the compresion fx.  Since then she has been using a [facility] which she uses intermittently.    Pertinent History osteopenia, degenerative scoliosis    Limitations Standing;Walking    How long can you stand comfortably? 5 min    Patient Stated Goals improve pain with activity    Currently in Pain? Yes    Pain Score 0-No pain   up to  10/10   Pain Location Back    Pain Orientation Mid;Lower    Pain Descriptors / Indicators Aching;Sharp    Pain Type Chronic pain    Pain Onset More than a month ago    Pain Frequency Intermittent    Aggravating Factors  standing, walking    Pain Relieving Factors sitting, rest                [facility] PT Assessment - 10/02/20 1442       Assessment   Medical Diagnosis S22.080A (ICD-10-CM) - Compression fracture of T12 vertebra, initial encounter [facility](HCC)    Referring Provider (PT) Eldred MangesYates, Mark C, MD    Onset Date/Surgical Date --   Dec 2021   Hand Dominance Right    Next MD Visit 08/11/20    Prior Therapy at [facility] for pelvic floor      Precautions   Precautions Fall      Restrictions   Weight Bearing Restrictions No       Balance Screen   Has the patient fallen in the past 6 months No    Has the patient had a decrease in activity level because of a fear of falling?  Yes    Is the patient reluctant to leave their home because of a fear of falling?  No      Home Environment   Living Environment Assisted living    Living Arrangements Spouse/significant other      Prior Function   Level of Independence Independent    Vocation Retired    Leisure no regular exercise at this time (does some walking)      Observation/Other Assessments   Focus on Therapeutic Outcomes (FOTO)  50 (predicted 60)      Posture/Postural Control   Posture/Postural Control Postural limitations    Postural Limitations Rounded Shoulders;Forward head      ROM / Strength   AROM / PROM / Strength Strength      Strength   Overall Strength Comments tested in sitting    Strength Assessment Site Hip;Knee;Ankle    Right/Left Hip Right;Left    Right Hip Flexion 3+/5    Right Hip ABduction 3+/5    Left Hip Flexion 3+/5    Left Hip ABduction 3+/5    Right/Left Knee Right;Left    Right Knee Flexion 4/5    Right Knee Extension 4/5    Left Knee Flexion 4/5    Left Knee Extension 4/5    Right/Left Ankle Right;Left    Right Ankle Dorsiflexion 5/5    Left Ankle Dorsiflexion 5/5      Transfers   Five time sit to stand comments  27.10 with UE support                        Objective measurements completed on examination: See above findings.       [facility]02[facility] 1442       Exercises   Exercises Other Exercises    Other Exercises  see pt instructions - reviewed with pt                    PT Education - 10/02/20 1502     Education Details HEP, walking program    Person(s) Educated Patient    Methods Explanation;Demonstration;Handout    Comprehension Verbalized understanding;Returned demonstration              PT Short Term Goals - 10/02/20 1507  PT SHORT  TERM GOAL #1   Title Ind with inital HEP    Time 3    Period Weeks    Status New    Target Date 10/23/20      PT SHORT TERM GOAL #2   Title -      PT SHORT TERM GOAL #3   Title -      PT SHORT TERM GOAL #4   Title -      PT SHORT TERM GOAL #5   Title -               PT Long Term Goals - 10/02/20 1508       PT LONG TERM GOAL #1   Title independent with advanced HEP    Time 6    Period Weeks    Status New    Target Date 11/13/20      PT LONG TERM GOAL #2   Title demonstrate 4/5 bil hip strength for improved function    Time 6    Period Weeks    Status New    Target Date 11/13/20      PT LONG TERM GOAL #3   Title report pain < 5/10 with standing for 15 min for improved activity tolerance    Time 6    Period Weeks    Status New    Target Date 11/13/20      PT LONG TERM GOAL #4   Title FOTO score iproved to 60 for improved function    Time 6    Period Weeks    Status New    Target Date 11/13/20      PT LONG TERM GOAL #5   Title -                    Plan - 10/02/20 1453     Clinical Impression Statement Pt is a 80 y/o female who presents to OPPT for compression fx of T12 in Dec 2021.  She demonstrates decreased strength and balance as well as pain and decreased activity tolerance.  She will benefit from PT to address deficits listed.    Personal Factors and Comorbidities Comorbidity 2;Time since onset of injury/illness/exacerbation    Comorbidities osteopenia, degenerative scoliosis    Examination-Activity Limitations Lift;Locomotion Level;Reach Overhead;Transfers;Stand;Caring for Others    Examination-Participation Restrictions Cleaning;Community Activity;Meal Prep    Stability/Clinical Decision Making Evolving/Moderate complexity    Clinical Decision Making Moderate    Rehab Potential Good    PT Frequency 1x / week    PT Duration 6 weeks    PT Treatment/Interventions ADLs/Self Care Home Management;Cryotherapy;Electrical Stimulation;Moist  Heat;Balance training;Therapeutic exercise;Therapeutic activities;Functional mobility training;Gait training;Neuromuscular re-education;Patient/family education;Manual techniques;Taping;Dry needling    PT Next Visit Plan review HEP, general core/hip/back strengthening    PT Home Exercise Plan Access Code: [email or web address]    Consulted and Agree with Plan of Care Patient             Patient will benefit from skilled therapeutic intervention in order to improve the following deficits and impairments:  Decreased activity tolerance, Decreased strength, Pain, Difficulty walking, Decreased mobility, Decreased balance, Decreased range of motion, Postural dysfunction  Visit Diagnosis: Chronic midline low back pain without sciatica - Plan: PT plan of care cert/re-cert  Muscle weakness (generalized) - Plan: PT plan of care cert/re-cert  Unsteadiness on feet - Plan: PT plan of care cert/re-cert     Problem List Patient Active Problem List   Diagnosis Date Noted  Aortic atherosclerosis (HCC) by Abd CT scan on 02/16/2020 05/11/2020   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   Compression fracture of T12 vertebra (HCC) 02/21/2020   Cystocele with rectocele 12/13/2018   BMI 23.0-23.9, adult 11/01/2017   Osteoporosis with current pathological fracture 04/12/2017   OSA on CPAP 11/27/2014   IBS (irritable bowel syndrome) 11/11/2014   Encounter for Medicare annual wellness exam 11/11/2014   Medication management 04/12/2014   Abnormal blood sugar 10/10/2013   Mixed hyperlipidemia 10/08/2013   CAD (coronary artery disease) 02/03/2013   MVP (mitral valve prolapse) 02/03/2013   Vitamin D deficiency    GERD (gastroesophageal reflux disease)    COPD (chronic obstructive pulmonary disease) (HCC) 02/11/2010   Essential hypertension 01/28/2010   RAYNAUDS SYNDROME 01/28/2010      Clarita Crane, PT, DPT 10/02/20 3:13 PM     [facility] Physical Therapy [address] [place], [place],  [address] Phone: [phone]   Fax:  [phone]  Name: ADDILEE NEU MRN: [id number] Date of Birth: [birth year]

## 2020-10-03 DIAGNOSIS — K862 Cyst of pancreas: Secondary | ICD-10-CM | POA: Diagnosis not present

## 2020-10-03 DIAGNOSIS — N281 Cyst of kidney, acquired: Secondary | ICD-10-CM | POA: Diagnosis not present

## 2020-10-03 DIAGNOSIS — D490 Neoplasm of unspecified behavior of digestive system: Secondary | ICD-10-CM | POA: Diagnosis not present

## 2020-10-06 DIAGNOSIS — D490 Neoplasm of unspecified behavior of digestive system: Secondary | ICD-10-CM | POA: Diagnosis not present

## 2020-10-06 DIAGNOSIS — K862 Cyst of pancreas: Secondary | ICD-10-CM | POA: Diagnosis not present

## 2020-10-22 ENCOUNTER — Encounter: Payer: Self-pay | Admitting: Physical Therapy

## 2020-10-22 ENCOUNTER — Other Ambulatory Visit: Payer: Self-pay

## 2020-10-22 ENCOUNTER — Ambulatory Visit: Payer: Medicare PPO | Admitting: Physical Therapy

## 2020-10-22 DIAGNOSIS — G8929 Other chronic pain: Secondary | ICD-10-CM

## 2020-10-22 DIAGNOSIS — M6281 Muscle weakness (generalized): Secondary | ICD-10-CM

## 2020-10-22 DIAGNOSIS — R2681 Unsteadiness on feet: Secondary | ICD-10-CM | POA: Diagnosis not present

## 2020-10-22 DIAGNOSIS — M545 Low back pain, unspecified: Secondary | ICD-10-CM

## 2020-10-22 NOTE — Therapy (Signed)
[facility] [place], [place], [address] Phone: [phone]   Fax:  [phone]  Physical Therapy Treatment  Patient Details  Name: Jasmine Reeves MRN: [id number] Date of Birth: 1940-11-05 Referring Provider (PT): Marybelle Killings, MD   Encounter Date: 10/22/2020   PT End of Session - 10/22/20 1513     Visit Number 2    Number of Visits 6    Date for PT Re-Evaluation 11/13/20    Authorization Type [employer]    Authorization Time Period 10/02/20-12/02/20    Authorization - Visit Number 2    Authorization - Number of Visits 12    Progress Note Due on Visit 10    PT Start Time 9528    PT Stop Time 1550    PT Time Calculation (min) 43 min    Activity Tolerance Patient tolerated treatment well    Behavior During Therapy [facility] for tasks assessed/performed             Past Medical History:  Diagnosis Date   COPD (chronic obstructive pulmonary disease) ([place])    Fibromyalgia    GERD (gastroesophageal reflux disease)    IBS (irritable bowel syndrome)    Scoliosis     Past Surgical History:  Procedure Laterality Date   ABDOMINAL HYSTERECTOMY     APPENDECTOMY     CORONARY ANGIOPLASTY  12/11   CORONARY ARTERY BYPASS GRAFT     CYSTOCELE REPAIR  03/12/2019   cystocele and rectocele repair, Dr. Nori Riis   ESOPHAGOGASTRODUODENOSCOPY  07/21/12   polyps, Hiatal hernia Dr. Cristina Gong   TONSILLECTOMY     TUBAL LIGATION      There were no vitals filed for this visit.   Subjective Assessment - 10/22/20 1511     Subjective doing exercises 1x/day, and working on increasing her walking to 20 min    Pertinent History osteopenia, degenerative scoliosis    Limitations Standing;Walking    How long can you stand comfortably? 5 min    Patient Stated Goals improve pain with activity    Currently in Pain? Yes    Pain Score 7     Pain Location Back    Pain Orientation Lower;Right    Pain Descriptors / Indicators Aching;Sharp    Pain Type Chronic pain     Pain Onset More than a month ago    Pain Frequency Intermittent    Aggravating Factors  standing, walking    Pain Relieving Factors sitting, rest                               [facility] Adult PT Treatment/Exercise - 10/22/20 1512       Exercises   Exercises Lumbar      Lumbar Exercises: Aerobic   Nustep L6 x 10 min      Lumbar Exercises: Seated   Long Arc Quad on Chair Both;3 sets;10 reps;Weights    LAQ on Chair Weights (lbs) 3    Sit to Stand 10 reps   without UE support; 18" height     Lumbar Exercises: Supine   Clam 20 reps   single limb with L3 band; with core engagement   Bridge 20 reps;5 seconds                    PT Education - 10/22/20 1553     Education Details HEP    Person(s) Educated Patient    Methods Explanation;Demonstration;Handout  Comprehension Verbalized understanding;Returned demonstration              PT Short Term Goals - 10/22/20 1549       PT SHORT TERM GOAL #1   Title Ind with inital HEP    Time 3    Period Weeks    Status Achieved    Target Date 10/23/20      PT SHORT TERM GOAL #2   Title -      PT SHORT TERM GOAL #3   Title -      PT SHORT TERM GOAL #4   Title -      PT SHORT TERM GOAL #5   Title -               PT Long Term Goals - 10/02/20 1508       PT LONG TERM GOAL #1   Title independent with advanced HEP    Time 6    Period Weeks    Status New    Target Date 11/13/20      PT LONG TERM GOAL #2   Title demonstrate 4/5 bil hip strength for improved function    Time 6    Period Weeks    Status New    Target Date 11/13/20      PT LONG TERM GOAL #3   Title report pain < 5/10 with standing for 15 min for improved activity tolerance    Time 6    Period Weeks    Status New    Target Date 11/13/20      PT LONG TERM GOAL #4   Title FOTO score iproved to 60 for improved function    Time 6    Period Weeks    Status New    Target Date 11/13/20      PT LONG TERM GOAL  #5   Title -                   Plan - 10/22/20 1552     Clinical Impression Statement Pt is independent and compliant with initial HEP and added additional exercise today.  Will continue to benefit from PT to maximize function.  No LTGs met as only 2nd visit.    Personal Factors and Comorbidities Comorbidity 2;Time since onset of injury/illness/exacerbation    Comorbidities osteopenia, degenerative scoliosis    Examination-Activity Limitations Lift;Locomotion Level;Reach Overhead;Transfers;Stand;Caring for Others    Examination-Participation Restrictions Cleaning;Community Activity;Meal Prep    Stability/Clinical Decision Making Evolving/Moderate complexity    Rehab Potential Good    PT Frequency 1x / week    PT Duration 6 weeks    PT Treatment/Interventions ADLs/Self Care Home Management;Cryotherapy;Electrical Stimulation;Moist Heat;Balance training;Therapeutic exercise;Therapeutic activities;Functional mobility training;Gait training;Neuromuscular re-education;Patient/family education;Manual techniques;Taping;Dry needling    PT Next Visit Plan general core/hip/back strengthening, endurance exercises    PT Home Exercise Plan Access Code: [email or web address]    Consulted and Agree with Plan of Care Patient             Patient will benefit from skilled therapeutic intervention in order to improve the following deficits and impairments:  Decreased activity tolerance, Decreased strength, Pain, Difficulty walking, Decreased mobility, Decreased balance, Decreased range of motion, Postural dysfunction  Visit Diagnosis: Chronic midline low back pain without sciatica  Muscle weakness (generalized)  Unsteadiness on feet     Problem List Patient Active Problem List   Diagnosis Date Noted   Aortic atherosclerosis ([place]) by Abd CT scan on 02/16/2020 05/11/2020  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   Compression fracture of T12 vertebra ([place]) 02/21/2020   Cystocele with rectocele 12/13/2018   BMI  23.0-23.9, adult 11/01/2017   Osteoporosis with current pathological fracture 04/12/2017   OSA on CPAP 11/27/2014   IBS (irritable bowel syndrome) 11/11/2014   Encounter for Medicare annual wellness exam 11/11/2014   Medication management 04/12/2014   Abnormal blood sugar 10/10/2013   Mixed hyperlipidemia 10/08/2013   CAD (coronary artery disease) 02/03/2013   MVP (mitral valve prolapse) 02/03/2013   Vitamin D deficiency    GERD (gastroesophageal reflux disease)    COPD (chronic obstructive pulmonary disease) ([place]) 02/11/2010   Essential hypertension 01/28/2010   RAYNAUDS SYNDROME 01/28/2010      Laureen Abrahams, PT, DPT 10/22/20 3:54 PM     [facility] [address] [place], [place], [address] Phone: [phone]   Fax:  [phone]  Name: PEBBLES ZEIDERS MRN: [id number] Date of Birth: [birth year]

## 2020-10-22 NOTE — Patient Instructions (Signed)
Access Code: [email or web address] URL: [email or web address] Date: 10/22/2020 Prepared by: Moshe Cipro  Exercises Supine Bridge - 2 x daily - 7 x weekly - 10 reps - 1 sets - 5 sec hold Seated Long Arc Quad - 2 x daily - 7 x weekly - 3 sets - 10 reps Standing Hip Extension with Counter Support - 2 x daily - 7 x weekly - 1 sets - 10 reps Standing Hip Abduction with Counter Support - 2 x daily - 7 x weekly - 1 sets - 10 reps Hooklying Single Leg Bent Knee Fallouts with Resistance - 2 x daily - 7 x weekly - 2 sets - 10 reps

## 2020-10-29 ENCOUNTER — Ambulatory Visit: Payer: Medicare PPO | Admitting: Physical Therapy

## 2020-10-29 ENCOUNTER — Encounter: Payer: Self-pay | Admitting: Physical Therapy

## 2020-10-29 ENCOUNTER — Other Ambulatory Visit: Payer: Self-pay

## 2020-10-29 DIAGNOSIS — M545 Low back pain, unspecified: Secondary | ICD-10-CM | POA: Diagnosis not present

## 2020-10-29 DIAGNOSIS — R2681 Unsteadiness on feet: Secondary | ICD-10-CM

## 2020-10-29 DIAGNOSIS — M6281 Muscle weakness (generalized): Secondary | ICD-10-CM

## 2020-10-29 DIAGNOSIS — G8929 Other chronic pain: Secondary | ICD-10-CM | POA: Diagnosis not present

## 2020-10-29 NOTE — Therapy (Signed)
[facility] Physical Therapy [address] [place], [place], [address] Phone: [phone]   Fax:  [phone]  Physical Therapy Treatment  Patient Details  Name: Jasmine Reeves MRN: [id number] Date of Birth: 08/06/1940 Referring Provider (PT): Eldred Manges, MD   Encounter Date: 10/29/2020   PT End of Session - 10/29/20 1549     Visit Number 3    Number of Visits 6    Date for PT Re-Evaluation 11/13/20    Authorization Type [employer]    Authorization Time Period 10/02/20-12/02/20    Authorization - Visit Number 3    Authorization - Number of Visits 12    Progress Note Due on Visit 10    PT Start Time 1515    PT Stop Time 1553    PT Time Calculation (min) 38 min    Activity Tolerance Patient tolerated treatment well    Behavior During Therapy [facility] for tasks assessed/performed             Past Medical History:  Diagnosis Date   COPD (chronic obstructive pulmonary disease) (HCC)    Fibromyalgia    GERD (gastroesophageal reflux disease)    IBS (irritable bowel syndrome)    Scoliosis     Past Surgical History:  Procedure Laterality Date   ABDOMINAL HYSTERECTOMY     APPENDECTOMY     CORONARY ANGIOPLASTY  12/11   CORONARY ARTERY BYPASS GRAFT     CYSTOCELE REPAIR  03/12/2019   cystocele and rectocele repair, Dr. Jennette Kettle   ESOPHAGOGASTRODUODENOSCOPY  07/21/12   polyps, Hiatal hernia Dr. Matthias Hughs   TONSILLECTOMY     TUBAL LIGATION      There were no vitals filed for this visit.   Subjective Assessment - 10/29/20 1517     Subjective did some work at her house last week and feels that aggravated her back, doesn't hurt all the time and doesn't impact sleep.    Pertinent History osteopenia, degenerative scoliosis    Limitations Standing;Walking    How long can you stand comfortably? 5 min    Patient Stated Goals improve pain with activity    Currently in Pain? Yes    Pain Score 2    up to 9/10   Pain Location Back    Pain Orientation Right;Left;Lower     Pain Descriptors / Indicators Aching;Sharp    Pain Onset More than a month ago    Pain Frequency Intermittent    Aggravating Factors  standing, walking    Pain Relieving Factors sitting, rest                               [facility] Adult PT Treatment/Exercise - 10/29/20 1519       Lumbar Exercises: Aerobic   Nustep L6 x 10 min      Lumbar Exercises: Seated   Sit to Stand 20 reps      Lumbar Exercises: Supine   Bridge 20 reps;5 seconds      Lumbar Exercises: Sidelying   Clam Both;20 reps      Lumbar Exercises: Quadruped   Straight Leg Raise --   2 sets of 5 bil                     PT Short Term Goals - 10/22/20 1549       PT SHORT TERM GOAL #1   Title Ind with inital HEP    Time 3  Period Weeks    Status Achieved    Target Date 10/23/20      PT SHORT TERM GOAL #2   Title -      PT SHORT TERM GOAL #3   Title -      PT SHORT TERM GOAL #4   Title -      PT SHORT TERM GOAL #5   Title -               PT Long Term Goals - 10/02/20 1508       PT LONG TERM GOAL #1   Title independent with advanced HEP    Time 6    Period Weeks    Status New    Target Date 11/13/20      PT LONG TERM GOAL #2   Title demonstrate 4/5 bil hip strength for improved function    Time 6    Period Weeks    Status New    Target Date 11/13/20      PT LONG TERM GOAL #3   Title report pain < 5/10 with standing for 15 min for improved activity tolerance    Time 6    Period Weeks    Status New    Target Date 11/13/20      PT LONG TERM GOAL #4   Title FOTO score iproved to 60 for improved function    Time 6    Period Weeks    Status New    Target Date 11/13/20      PT LONG TERM GOAL #5   Title -                   Plan - 10/29/20 1553     Clinical Impression Statement Pt tolerated session well today with continued focus on strengthening and general endurance.  Will continue to benefit from PT to maximize function.    Personal  Factors and Comorbidities Comorbidity 2;Time since onset of injury/illness/exacerbation    Comorbidities osteopenia, degenerative scoliosis    Examination-Activity Limitations Lift;Locomotion Level;Reach Overhead;Transfers;Stand;Caring for Others    Examination-Participation Restrictions Cleaning;Community Activity;Meal Prep    Stability/Clinical Decision Making Evolving/Moderate complexity    Rehab Potential Good    PT Frequency 1x / week    PT Duration 6 weeks    PT Treatment/Interventions ADLs/Self Care Home Management;Cryotherapy;Electrical Stimulation;Moist Heat;Balance training;Therapeutic exercise;Therapeutic activities;Functional mobility training;Gait training;Neuromuscular re-education;Patient/family education;Manual techniques;Taping;Dry needling    PT Next Visit Plan general core/hip/back strengthening, endurance exercises, review HEP PRN    PT Home Exercise Plan Access Code: [email or web address]    Consulted and Agree with Plan of Care Patient             Patient will benefit from skilled therapeutic intervention in order to improve the following deficits and impairments:  Decreased activity tolerance, Decreased strength, Pain, Difficulty walking, Decreased mobility, Decreased balance, Decreased range of motion, Postural dysfunction  Visit Diagnosis: Chronic midline low back pain without sciatica  Muscle weakness (generalized)  Unsteadiness on feet     Problem List Patient Active Problem List   Diagnosis Date Noted   Aortic atherosclerosis (HCC) by Abd CT scan on 02/16/2020 05/11/2020   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   Compression fracture of T12 vertebra (HCC) 02/21/2020   Cystocele with rectocele 12/13/2018   BMI 23.0-23.9, adult 11/01/2017   Osteoporosis with current pathological fracture 04/12/2017   OSA on CPAP 11/27/2014   IBS (irritable bowel syndrome) 11/11/2014   Encounter for Medicare annual wellness exam 11/11/2014  Medication management 04/12/2014   Abnormal blood  sugar 10/10/2013   Mixed hyperlipidemia 10/08/2013   CAD (coronary artery disease) 02/03/2013   MVP (mitral valve prolapse) 02/03/2013   Vitamin D deficiency    GERD (gastroesophageal reflux disease)    COPD (chronic obstructive pulmonary disease) (HCC) 02/11/2010   Essential hypertension 01/28/2010   RAYNAUDS SYNDROME 01/28/2010       Clarita Crane, PT, DPT 10/29/20 3:55 PM     [facility] Therapy [address] [place], [place], [address] Phone: [phone]   Fax:  [phone]  Name: Jasmine Reeves MRN: [id number] Date of Birth: 12-Sep-1940

## 2020-11-04 ENCOUNTER — Encounter: Payer: Medicare PPO | Admitting: Internal Medicine

## 2020-11-12 ENCOUNTER — Ambulatory Visit: Payer: Medicare PPO | Admitting: Physical Therapy

## 2020-11-12 ENCOUNTER — Encounter: Payer: Self-pay | Admitting: Physical Therapy

## 2020-11-12 ENCOUNTER — Other Ambulatory Visit: Payer: Self-pay

## 2020-11-12 DIAGNOSIS — R2681 Unsteadiness on feet: Secondary | ICD-10-CM | POA: Diagnosis not present

## 2020-11-12 DIAGNOSIS — M545 Low back pain, unspecified: Secondary | ICD-10-CM | POA: Diagnosis not present

## 2020-11-12 DIAGNOSIS — G8929 Other chronic pain: Secondary | ICD-10-CM | POA: Diagnosis not present

## 2020-11-12 DIAGNOSIS — M6281 Muscle weakness (generalized): Secondary | ICD-10-CM | POA: Diagnosis not present

## 2020-11-12 NOTE — Therapy (Signed)
[place], [place], [address] Phone: [phone]   Fax:  [phone]  Physical Therapy Treatment/Recertification  Patient Details  Name: Jasmine Reeves MRN: [id number] Date of Birth: 08/29/1940 Referring Provider (PT): Marybelle Killings, MD   Encounter Date: 11/12/2020   PT End of Session - 11/12/20 1511     Visit Number 4    Number of Visits 7    Date for PT Re-Evaluation 12/03/20    Authorization Type [employer]    Authorization Time Period 10/02/20-12/02/20    Authorization - Visit Number 4    Authorization - Number of Visits 12    Progress Note Due on Visit 10    PT Start Time 3716    PT Stop Time 1508    PT Time Calculation (min) 46 min    Activity Tolerance Patient tolerated treatment well    Behavior During Therapy [facility] for tasks assessed/performed             Past Medical History:  Diagnosis Date   COPD (chronic obstructive pulmonary disease) (HCC)    Fibromyalgia    GERD (gastroesophageal reflux disease)    IBS (irritable bowel syndrome)    Scoliosis     Past Surgical History:  Procedure Laterality Date   ABDOMINAL HYSTERECTOMY     APPENDECTOMY     CORONARY ANGIOPLASTY  12/11   CORONARY ARTERY BYPASS GRAFT     CYSTOCELE REPAIR  03/12/2019   cystocele and rectocele repair, Dr. Nori Riis   ESOPHAGOGASTRODUODENOSCOPY  07/21/12   polyps, Hiatal hernia Dr. Cristina Gong   TONSILLECTOMY     TUBAL LIGATION      There were no vitals filed for this visit.   Subjective Assessment - 11/12/20 1424     Subjective "today's not a good day, everything hurts."    Pertinent History osteopenia, degenerative scoliosis    Limitations Standing;Walking    How long can you stand comfortably? increased to 15-20 min at least    Patient Stated Goals improve pain with activity    Pain Score 2     Pain Location Back    Pain Orientation Right;Left;Lower    Pain Descriptors / Indicators Aching;Sharp    Pain Type Chronic pain     Pain Onset More than a month ago    Pain Frequency Intermittent    Aggravating Factors  standing, walking    Pain Relieving Factors sitting, rest                [facility] PT Assessment - 11/12/20 1441       Assessment   Medical Diagnosis S22.080A (ICD-10-CM) - Compression fracture of T12 vertebra, initial encounter [facility])    Referring Provider (PT) Marybelle Killings, MD      Observation/Other Assessments   Focus on Therapeutic Outcomes (FOTO)  52 (predicted 60)      Strength   Right Hip Flexion 4/5    Right Hip ABduction 4/5    Left Hip Flexion 4/5    Left Hip ABduction 4/5                           [facility] Adult PT Treatment/Exercise - 11/12/20 1422       Self-Care   Self-Care Other Self-Care Comments    Other Self-Care Comments  initiated pain neuroscience education and discussion of ways to increase activitiy at home as well as adding enjoyable activities.  Also dicussed recommendation for chronic pain referral  to psychologist and pt very open.  Will place referral for pt if able.      Lumbar Exercises: Aerobic   Nustep L6 x 10 min      Lumbar Exercises: Supine   Clam 20 reps   single limb with L3 band; with core engagement   Bridge 20 reps;5 seconds                       PT Short Term Goals - 10/22/20 1549       PT SHORT TERM GOAL #1   Title Ind with inital HEP    Time 3    Period Weeks    Status Achieved    Target Date 10/23/20      PT SHORT TERM GOAL #2   Title -      PT SHORT TERM GOAL #3   Title -      PT SHORT TERM GOAL #4   Title -      PT SHORT TERM GOAL #5   Title -               PT Long Term Goals - 11/12/20 1511       PT LONG TERM GOAL #1   Title independent with advanced HEP    Time 6    Period Weeks    Status On-going    Target Date 12/03/20      PT LONG TERM GOAL #2   Title demonstrate 4/5 bil hip strength for improved function    Time 6    Period Weeks    Status Achieved      PT LONG TERM  GOAL #3   Title report pain < 5/10 with standing for 15 min for improved activity tolerance    Time 6    Period Weeks    Status Achieved      PT LONG TERM GOAL #4   Title FOTO score improved to 60 for improved function    Time 6    Period Weeks    Status On-going    Target Date 12/03/20      PT LONG TERM GOAL #5   Title -                   Plan - 11/12/20 1512     Clinical Impression Statement Pt has met 2/4 LTGs and is demonstrating progress towards other LTGs.  Continues to be limited by chronic pain and feel she will benefit from meeting with psychologist that specializes in chronic pain.  Pt is very open to this idea so will initiate referral.  Plan to see 1x/wk x 3 more weeks to address remaining goals.    Personal Factors and Comorbidities Comorbidity 2;Time since onset of injury/illness/exacerbation    Comorbidities osteopenia, degenerative scoliosis    Examination-Activity Limitations Lift;Locomotion Level;Reach Overhead;Transfers;Stand;Caring for Others    Examination-Participation Restrictions Cleaning;Community Activity;Meal Prep    Stability/Clinical Decision Making Evolving/Moderate complexity    Rehab Potential Good    PT Frequency 1x / week    PT Duration 6 weeks    PT Treatment/Interventions ADLs/Self Care Home Management;Cryotherapy;Electrical Stimulation;Moist Heat;Balance training;Therapeutic exercise;Therapeutic activities;Functional mobility training;Gait training;Neuromuscular re-education;Patient/family education;Manual techniques;Taping;Dry needling    PT Next Visit Plan general core/hip/back strengthening, endurance exercises, look at adding additional weight bearing exercises to HEP    PT Home Exercise Plan Access Code: [email or web address]    Consulted and Agree with Plan of Care Patient  Patient will benefit from skilled therapeutic intervention in order to improve the following deficits and impairments:  Decreased activity tolerance,  Decreased strength, Pain, Difficulty walking, Decreased mobility, Decreased balance, Decreased range of motion, Postural dysfunction  Visit Diagnosis: Chronic midline low back pain without sciatica - Plan: PT plan of care cert/re-cert  Muscle weakness (generalized) - Plan: PT plan of care cert/re-cert  Unsteadiness on feet - Plan: PT plan of care cert/re-cert     Problem List Patient Active Problem List   Diagnosis Date Noted   Aortic atherosclerosis ([place]) by Abd CT scan on 02/16/2020 05/11/2020   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   Compression fracture of T12 vertebra ([place]) 02/21/2020   Cystocele with rectocele 12/13/2018   BMI 23.0-23.9, adult 11/01/2017   Osteoporosis with current pathological fracture 04/12/2017   OSA on CPAP 11/27/2014   IBS (irritable bowel syndrome) 11/11/2014   Encounter for Medicare annual wellness exam 11/11/2014   Medication management 04/12/2014   Abnormal blood sugar 10/10/2013   Mixed hyperlipidemia 10/08/2013   CAD (coronary artery disease) 02/03/2013   MVP (mitral valve prolapse) 02/03/2013   Vitamin D deficiency    GERD (gastroesophageal reflux disease)    COPD (chronic obstructive pulmonary disease) ([place]) 02/11/2010   Essential hypertension 01/28/2010   RAYNAUDS SYNDROME 01/28/2010      Laureen Abrahams, PT, DPT 11/12/20 3:15 PM    [facility] [address] [place], [place], [address] Phone: [phone]   Fax:  [phone]  Name: Jasmine Reeves MRN: [id number] Date of Birth: 02/08/1941

## 2020-11-16 ENCOUNTER — Encounter: Payer: Self-pay | Admitting: Internal Medicine

## 2020-11-16 NOTE — Patient Instructions (Signed)

## 2020-11-16 NOTE — Progress Notes (Signed)
Annual Screening/Preventative Visit & Comprehensive Evaluation &  Examination  Future Appointments  Date Time Provider Department Center  11/17/2020    -   CPE 11:00 AM [name], MD [facility] None  12/08/2020  3:00 PM Hilbert Corrigan [facility]  03/23/2021  2:00 PM Lennette Bihari, MD [facility]  08/11/2021  2:00 PM Eldred Manges, MD [facility] None  08/17/2021     -  Wellness  2:00 PM Judd Gaudier, NP [facility] None  11/17/2021    -   CPE 11:00 AM [name], MD [facility] None        This very nice 80 y.o. MWF presents for a Screening /Preventative Visit & comprehensive evaluation and management of multiple medical co-morbidities.  Patient has been followed for HTN, HLD, Prediabetes  and Vitamin D Deficiency. Patient has hx/o COPD by CXR with very remote 24 yr smoking hx quitting in 1967. Patient also has hx/o OSA on CPAP with improved sleep hygiene.  Her GERD is controlled on her meds.      Patient was dx'd with a T12 Compression Fx in Dec 2021 and was seen by Dr Ophelia Charter in June 2022  & was restarted on Fosamax.         HTN predates since 70 In Dec 2001, patient presented with ACS & had PCA/Stents x 2 and then in Jan 2002 , she underwent CABG.  In 2015,  she had a negative Stress Myoview.  Patient's BP has been controlled at home and patient denies any cardiac symptoms as chest pain, palpitations, shortness of breath, dizziness or ankle swelling. Today's BP is at goal - 118/72 .       Patient's hyperlipidemia is controlled with diet and Vytorin. Patient denies myalgias or other medication SE's. Last lipids were at goal:  Lab Results  Component Value Date   CHOL 142 08/14/2020   HDL 62 08/14/2020   LDLCALC 61 08/14/2020   TRIG 106 08/14/2020   CHOLHDL 2.3 08/14/2020         Patient has hx/o prediabetes (A1c 6.3% /2012 and 5.8% /2019) and patient denies reactive hypoglycemic symptoms, visual blurring, diabetic polys or paresthesias. Last A1c was not at  goal:  Lab Results  Component Value Date   HGBA1C 5.8 (H) 05/12/2020         Finally, patient has history of Vitamin D Deficiency and last Vitamin D was at goal:  Lab Results  Component Value Date   VD25OH 79 05/12/2020     Current Outpatient Medications on File Prior to Visit  Medication Sig   alendronate (FOSAMAX) 70 MG tablet Take 1 tablet once a week   aspirin 81 MG tablet Take daily.   CALCIUM 500 mg  Take daily.   VITAMIN D 2000 u Take 2,000 Units 2 times daily.   diltiazem-XR 180 MG 24 hr capsule TAKE 1 CAPSULE  DAILY   docusate 100 MG capsule Take 1 capsule  every 12 (twelve) hours.   ezetimibe-simvastatin 10-20 MG tablet TAKE 1 TABLET DAILY    fexofenadine 180 MG tablet Take daily.   fluticasone (VERAMYST) 27.5 MCG/SPRAY nasal spray Place 2 sprays into the nose daily as needed for rhinitis.   IRON PO Take daily.   labetalol 100 MG tablet TAKE 1 TABLET(100 MG) BY MOUTH AT BEDTIME   Magnesium 400 MG CAPS Take by mouth daily.   methocarbamol 500 MG tablet Take 1 tablet every 8  hours as needed.   Multiple Vitamins-Minerals Take 1 tablet by  mouth daily.   NITROSTAT 0.4 MG SL tablet as needed for chest pain.   pantoprazole (PROTONIX) 40 MG tablet TAKE 1 TABLET TWICE DAILY    sucralfate (CARAFATE) 1 g tablet TAKE 1 TABLET FOUR TIMES DAILY   traMADol (ULTRAM) 50 MG tablet Take 1/2-1 tab every 4 hrs as needed - severe pain.    triamcinolone ointment (KENALOG) 0.1 % Apply 1 application topically 2 (two) times daily.   vitamin C (500 MG tablet Take aily.   Zinc 50 MG TABS Take by mouth daily.      Allergies  Allergen Reactions   Altace [Ramipril]     cough   Azithromycin     palpitation   Celebrex [Celecoxib]     Gi upset    Hydrochlorothiazide     cramping   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   Prilosec [Omeprazole]     constipation   Tape    Tapentadol redness & itch     Past Medical History:  Diagnosis Date   COPD  (chronic obstructive pulmonary disease) (HCC)    Fibromyalgia    GERD (gastroesophageal reflux disease)    IBS (irritable bowel syndrome)    Scoliosis      Health Maintenance  Topic Date Due   Hepatitis C Screening  Never done   Zoster Vaccines- Shingrix (1 of 2) Never done   FOOT EXAM  07/31/2015   COVID-19 Vaccine (3 - Pfizer risk series) 05/24/2019   COLONOSCOPY  01/03/2020   INFLUENZA VACCINE  09/29/2020   URINE MICROALBUMIN  10/29/2020   HEMOGLOBIN A1C  11/12/2020   OPHTHALMOLOGY EXAM  11/27/2020   TETANUS/TDAP  02/11/2028   DEXA SCAN  Completed   HPV VACCINES  Aged Out     Immunization History  Administered Date(s) Administered   Influenza, High Dose 11/11/2014, 12/08/2015   Influenza,inj,Quad  11/27/2016, 11/20/2017, 11/14/2018   Influenza 11/20/2019   PFIZER SARS-COV-2 Vacc 04/03/2019, 04/26/2019   Pneumococcal -13 04/16/2014   Pneumococcal-23 03/01/1998, 03/12/2010   Td 03/27/2012   Tdap 02/10/2018   Zoster, Live 03/02/2007    Last Colon - 07/21/2012 - Dr Matthias Hughs - recc 5 yr f/u, then  on 01/07/2020 Re-scheduled by Dr Matthias Hughs  for 9 years  - Nov 2023.   Last MGM - 12/13/2018 - over due   Last dexaBMD - 12/15/2018  Past Surgical History:  Procedure Laterality Date   ABDOMINAL HYSTERECTOMY     APPENDECTOMY     CORONARY ANGIOPLASTY  12/11   CORONARY ARTERY BYPASS GRAFT     CYSTOCELE REPAIR  03/12/2019   cystocele and rectocele repair, Dr. Jennette Kettle   ESOPHAGOGASTRODUODENOSCOPY  07/21/12   polyps, Hiatal hernia Dr. Matthias Hughs   TONSILLECTOMY     TUBAL LIGATION       Family History  Problem Relation Age of Onset   Heart disease Brother    Hyperlipidemia Brother      Social History   Tobacco Use   Smoking status: Former    Types: Cigarettes    Quit date: 09/03/1965    Years since quitting: 55.2   Smokeless tobacco: Never  Substance Use Topics   Alcohol use: Yes    Alcohol/week: 3.0 standard drinks    Types: 3 Standard drinks or equivalent per week     Comment: rare   Drug use: No      ROS Constitutional: Denies fever, chills, weight loss/gain, headaches, insomnia,  night sweats, and change in appetite. Does c/o  fatigue. Eyes: Denies redness, blurred vision, diplopia, discharge, itchy, watery eyes.  ENT: Denies discharge, congestion, post nasal drip, epistaxis, sore throat, earache, hearing loss, dental pain, Tinnitus, Vertigo, Sinus pain, snoring.  Cardio: Denies chest pain, palpitations, irregular heartbeat, syncope, dyspnea, diaphoresis, orthopnea, PND, claudication, edema Respiratory: denies cough, dyspnea, DOE, pleurisy, hoarseness, laryngitis, wheezing.  Gastrointestinal: Denies dysphagia, heartburn, reflux, water brash, pain, cramps, nausea, vomiting, bloating, diarrhea, constipation, hematemesis, melena, hematochezia, jaundice, hemorrhoids Genitourinary: Denies dysuria, frequency, urgency, nocturia, hesitancy, discharge, hematuria, flank pain Breast: Breast lumps, nipple discharge, bleeding.  Musculoskeletal: Denies arthralgia, myalgia, stiffness, Jt. Swelling, pain, limp, and strain/sprain. Denies falls. Skin: Denies puritis, rash, hives, warts, acne, eczema, changing in skin lesion Neuro: No weakness, tremor, incoordination, spasms, paresthesia, pain Psychiatric: Denies confusion, memory loss, sensory loss. Denies Depression. Endocrine: Denies change in weight, skin, hair change, nocturia, and paresthesia, diabetic polys, visual blurring, hyper / hypo glycemic episodes.  Heme/Lymph: No excessive bleeding, bruising, enlarged lymph nodes.  Physical Exam  BP 118/72   Pulse 72   Temp 97.6 F (36.4 C)   Resp 17   Ht 5\' 5"  (1.651 m)   Wt 140 lb 3.2 oz (63.6 kg)   SpO2 98%   BMI 23.33 kg/m   General Appearance: Well nourished, well groomed and in no apparent distress.  Eyes: PERRLA, EOMs, conjunctiva no swelling or erythema, normal fundi and vessels. Sinuses: No frontal/maxillary tenderness ENT/Mouth: EACs patent / TMs   nl. Nares clear without erythema, swelling, mucoid exudates. Oral hygiene is good. No erythema, swelling, or exudate. Tongue normal, non-obstructing. Tonsils not swollen or erythematous. Hearing normal.  Neck: Supple, thyroid not palpable. No bruits, nodes or JVD. Respiratory: Respiratory effort normal.  BS equal and clear bilateral without rales, rhonci, wheezing or stridor. Cardio: Heart sounds are normal with regular rate and rhythm and no murmurs, rubs or gallops. Peripheral pulses are normal and equal bilaterally without edema. No aortic or femoral bruits. Chest: symmetric & Kyphotic with normal excursions and percussion. Breasts: Symmetric, without lumps, nipple discharge, retractions, or fibrocystic changes.  Abdomen: protuberant,  soft with bowel sounds active. Nontender, no guarding, rebound, hernias, masses, or organomegaly.  Lymphatics: Non tender without lymphadenopathy.  Genitourinary:  Musculoskeletal: Full ROM all peripheral extremities, joint stability, 5/5 strength, and normal gait. Skin: Warm and dry without rashes, lesions, cyanosis, clubbing or  ecchymosis.  Neuro: Cranial nerves intact, reflexes equal bilaterally. Normal muscle tone, no cerebellar symptoms. Sensation intact.  Pysch: Alert and oriented X 3, normal affect, Insight and Judgment appropriate.    Assessment and Plan  1. Annual Preventative Screening Examination   2. Essential hypertension  - EKG 12-Lead - Urinalysis, Routine w reflex microscopic - Microalbumin / creatinine urine ratio - CBC with Differential/Platelet - COMPLETE METABOLIC PANEL WITH GFR - Magnesium - TSH  3. Hyperlipidemia, mixed  - EKG 12-Lead - Lipid panel - TSH  4. Abnormal glucose  - EKG 12-Lead - Hemoglobin A1c - Insulin, random  5. Vitamin D deficiency  - VITAMIN D 25 Hydroxy   6. Coronary artery disease involving native coronary artery of native heart (HCC)  - EKG 12-Lead - Lipid panel  7. Aortic atherosclerosis  (HCC) by Abd CT scan on 02/16/2020  - EKG 12-Lead - Lipid panel  8. Chronic obstructive pulmonary disease , (HCC)   9. OSA on CPAP   10. Gastroesophageal reflux disease  - CBC with Differential/Platelet  11. Screening for colorectal cancer  - POC Hemoccult Bld/Stl   12. Screening for ischemic heart disease  -  EKG 12-Lead  13. FHx: heart disease  - EKG 12-Lead  14. Former smoker  - EKG 12-Lead  15. Medication management  - Urinalysis, Routine w reflex microscopic - Microalbumin / creatinine urine ratio - CBC with Differential/Platelet - COMPLETE METABOLIC PANEL WITH GFR - Magnesium - Lipid panel - TSH - Hemoglobin A1c - Insulin, random - VITAMIN D 25 Hydroxy            Patient was counseled in prudent diet to achieve/maintain BMI less than 25 for weight control, BP monitoring, regular exercise and medications. Discussed med's effects and SE's. Screening labs and tests as requested with regular follow-up as recommended. Over 40 minutes of exam, counseling, chart review and high complex critical decision making was performed.   Marinus Maw, MD

## 2020-11-17 ENCOUNTER — Ambulatory Visit (INDEPENDENT_AMBULATORY_CARE_PROVIDER_SITE_OTHER): Payer: Medicare PPO | Admitting: Internal Medicine

## 2020-11-17 ENCOUNTER — Other Ambulatory Visit: Payer: Self-pay

## 2020-11-17 VITALS — BP 118/72 | HR 72 | Temp 97.6°F | Resp 17 | Ht 65.0 in | Wt 140.2 lb

## 2020-11-17 DIAGNOSIS — Z79899 Other long term (current) drug therapy: Secondary | ICD-10-CM | POA: Diagnosis not present

## 2020-11-17 DIAGNOSIS — Z136 Encounter for screening for cardiovascular disorders: Secondary | ICD-10-CM | POA: Diagnosis not present

## 2020-11-17 DIAGNOSIS — Z8249 Family history of ischemic heart disease and other diseases of the circulatory system: Secondary | ICD-10-CM

## 2020-11-17 DIAGNOSIS — Z9989 Dependence on other enabling machines and devices: Secondary | ICD-10-CM

## 2020-11-17 DIAGNOSIS — R7309 Other abnormal glucose: Secondary | ICD-10-CM

## 2020-11-17 DIAGNOSIS — K219 Gastro-esophageal reflux disease without esophagitis: Secondary | ICD-10-CM | POA: Diagnosis not present

## 2020-11-17 DIAGNOSIS — Z0001 Encounter for general adult medical examination with abnormal findings: Secondary | ICD-10-CM

## 2020-11-17 DIAGNOSIS — E782 Mixed hyperlipidemia: Secondary | ICD-10-CM

## 2020-11-17 DIAGNOSIS — I25118 Atherosclerotic heart disease of native coronary artery with other forms of angina pectoris: Secondary | ICD-10-CM

## 2020-11-17 DIAGNOSIS — J449 Chronic obstructive pulmonary disease, unspecified: Secondary | ICD-10-CM

## 2020-11-17 DIAGNOSIS — Z1212 Encounter for screening for malignant neoplasm of rectum: Secondary | ICD-10-CM

## 2020-11-17 DIAGNOSIS — I700 Atherosclerosis of aorta: Secondary | ICD-10-CM

## 2020-11-17 DIAGNOSIS — Z780 Asymptomatic menopausal state: Secondary | ICD-10-CM

## 2020-11-17 DIAGNOSIS — I10 Essential (primary) hypertension: Secondary | ICD-10-CM | POA: Diagnosis not present

## 2020-11-17 DIAGNOSIS — Z0000 Encounter for general adult medical examination without abnormal findings: Secondary | ICD-10-CM | POA: Diagnosis not present

## 2020-11-17 DIAGNOSIS — Z87891 Personal history of nicotine dependence: Secondary | ICD-10-CM | POA: Diagnosis not present

## 2020-11-17 DIAGNOSIS — Z1231 Encounter for screening mammogram for malignant neoplasm of breast: Secondary | ICD-10-CM

## 2020-11-17 DIAGNOSIS — S22000D Wedge compression fracture of unspecified thoracic vertebra, subsequent encounter for fracture with routine healing: Secondary | ICD-10-CM

## 2020-11-17 DIAGNOSIS — Z1211 Encounter for screening for malignant neoplasm of colon: Secondary | ICD-10-CM

## 2020-11-17 DIAGNOSIS — E559 Vitamin D deficiency, unspecified: Secondary | ICD-10-CM | POA: Diagnosis not present

## 2020-11-17 DIAGNOSIS — G4733 Obstructive sleep apnea (adult) (pediatric): Secondary | ICD-10-CM

## 2020-11-18 LAB — COMPLETE METABOLIC PANEL WITH GFR
AG Ratio: 2.2 (calc) (ref 1.0–2.5)
ALT: 19 U/L (ref 6–29)
AST: 22 U/L (ref 10–35)
Albumin: 4.6 g/dL (ref 3.6–5.1)
Alkaline phosphatase (APISO): 57 U/L (ref 37–153)
BUN: 15 mg/dL (ref 7–25)
CO2: 27 mmol/L (ref 20–32)
Calcium: 9.3 mg/dL (ref 8.6–10.4)
Chloride: 106 mmol/L (ref 98–110)
Creat: 0.67 mg/dL (ref 0.60–1.00)
Globulin: 2.1 g/dL (calc) (ref 1.9–3.7)
Glucose, Bld: 83 mg/dL (ref 65–99)
Potassium: 4 mmol/L (ref 3.5–5.3)
Sodium: 142 mmol/L (ref 135–146)
Total Bilirubin: 0.6 mg/dL (ref 0.2–1.2)
Total Protein: 6.7 g/dL (ref 6.1–8.1)
eGFR: 89 mL/min/{1.73_m2} (ref >=60)

## 2020-11-18 LAB — INSULIN, RANDOM: Insulin: 24.1 u[IU]/mL — ABNORMAL HIGH

## 2020-11-18 LAB — TSH: TSH: 3.16 mIU/L (ref 0.40–4.50)

## 2020-11-18 LAB — URINALYSIS, ROUTINE W REFLEX MICROSCOPIC
Bacteria, UA: NONE SEEN /HPF
Bilirubin Urine: NEGATIVE
Glucose, UA: NEGATIVE
Hgb urine dipstick: NEGATIVE
Hyaline Cast: NONE SEEN /LPF
Ketones, ur: NEGATIVE
Nitrite: NEGATIVE
Protein, ur: NEGATIVE
RBC / HPF: NONE SEEN /HPF (ref 0–2)
Specific Gravity, Urine: 1.004 (ref 1.001–1.035)
Squamous Epithelial / HPF: NONE SEEN /HPF (ref <=5)
WBC, UA: NONE SEEN /HPF (ref 0–5)
pH: 6.5 (ref 5.0–8.0)

## 2020-11-18 LAB — MAGNESIUM: Magnesium: 2.2 mg/dL (ref 1.5–2.5)

## 2020-11-18 LAB — CBC WITH DIFFERENTIAL/PLATELET
Absolute Monocytes: 534 cells/uL (ref 200–950)
Basophils Absolute: 50 cells/uL (ref 0–200)
Basophils Relative: 0.9 %
Eosinophils Absolute: 171 cells/uL (ref 15–500)
Eosinophils Relative: 3.1 %
HCT: 39 % (ref 35.0–45.0)
Hemoglobin: 13 g/dL (ref 11.7–15.5)
Lymphs Abs: 1249 cells/uL (ref 850–3900)
MCH: 30.9 pg (ref 27.0–33.0)
MCHC: 33.3 g/dL (ref 32.0–36.0)
MCV: 92.6 fL (ref 80.0–100.0)
MPV: 9.8 fL (ref 7.5–12.5)
Monocytes Relative: 9.7 %
Neutro Abs: 3498 cells/uL (ref 1500–7800)
Neutrophils Relative %: 63.6 %
Platelets: 199 10*3/uL (ref 140–400)
RBC: 4.21 10*6/uL (ref 3.80–5.10)
RDW: 12 % (ref 11.0–15.0)
Total Lymphocyte: 22.7 %
WBC: 5.5 10*3/uL (ref 3.8–10.8)

## 2020-11-18 LAB — HEMOGLOBIN A1C
Hgb A1c MFr Bld: 5.5 % of total Hgb (ref <5.7)
Mean Plasma Glucose: 111 mg/dL
eAG (mmol/L): 6.2 mmol/L

## 2020-11-18 LAB — LIPID PANEL
Cholesterol: 150 mg/dL (ref <200)
HDL: 68 mg/dL (ref >=50)
LDL Cholesterol (Calc): 67 mg/dL (calc)
Non-HDL Cholesterol (Calc): 82 mg/dL (calc) (ref <130)
Total CHOL/HDL Ratio: 2.2 (calc) (ref <5.0)
Triglycerides: 74 mg/dL (ref <150)

## 2020-11-18 LAB — MICROSCOPIC MESSAGE

## 2020-11-18 LAB — MICROALBUMIN / CREATININE URINE RATIO
Creatinine, Urine: 14 mg/dL — ABNORMAL LOW (ref 20–275)
Microalb, Ur: <0.2 mg/dL

## 2020-11-18 LAB — VITAMIN D 25 HYDROXY (VIT D DEFICIENCY, FRACTURES): Vit D, 25-Hydroxy: 80 ng/mL (ref 30–100)

## 2020-11-18 NOTE — Progress Notes (Signed)
============================================================ -   Test results slightly outside the reference range are not unusual. If there is anything important, I will review this with you,  otherwise it is considered normal test values.  If you have further questions,  please do not hesitate to contact me at the office or via My Chart.  ============================================================ ============================================================  -  Total Chol = 150    &    LDL Chol = 67   -  Both     Excellent   - Very low risk for Heart Attack  / Stroke ============================================================ ============================================================  -  A1c - back to Normal - Non Diabetic range - Great ! ============================================================ ============================================================  - Vitamin D = 80 - Excellent  ============================================================ ============================================================  - All Else - CBC - Kidneys - Electrolytes - Liver - Magnesium & Thyroid    - all  Normal / OK ============================================================ ============================================================  - Keep up the [place] Work  !   ============================================================ ============================================================

## 2020-11-27 ENCOUNTER — Encounter: Payer: Self-pay | Admitting: Physical Therapy

## 2020-11-27 ENCOUNTER — Ambulatory Visit: Payer: Medicare PPO | Admitting: Physical Therapy

## 2020-11-27 ENCOUNTER — Other Ambulatory Visit: Payer: Self-pay

## 2020-11-27 DIAGNOSIS — M6281 Muscle weakness (generalized): Secondary | ICD-10-CM

## 2020-11-27 DIAGNOSIS — R2681 Unsteadiness on feet: Secondary | ICD-10-CM

## 2020-11-27 DIAGNOSIS — G8929 Other chronic pain: Secondary | ICD-10-CM

## 2020-11-27 DIAGNOSIS — M545 Low back pain, unspecified: Secondary | ICD-10-CM

## 2020-11-27 NOTE — Patient Instructions (Signed)
Access Code: [email or web address] URL: [email or web address] Date: 11/27/2020 Prepared by: Moshe Cipro  Exercises Supine Bridge - 2 x daily - 7 x weekly - 10 reps - 1 sets - 5 sec hold Hooklying Single Leg Bent Knee Fallouts with Resistance - 2 x daily - 7 x weekly - 2 sets - 10 reps Seated Long Arc Quad - 2 x daily - 7 x weekly - 2 sets - 10 reps Sit to Stand - 2 x daily - 7 x weekly - 10 reps - 1 sets Standing Hip Extension with Counter Support - 2 x daily - 7 x weekly - 1 sets - 10 reps Standing Hip Abduction with Counter Support - 2 x daily - 7 x weekly - 1 sets - 10 reps Heel Raises with Counter Support - 1 x daily - 7 x weekly - 20 reps - 1 sets Mini Squat with Counter Support - 1 x daily - 7 x weekly - 1 sets - 10 reps

## 2020-11-27 NOTE — Therapy (Signed)
[facility] [address] [place], [place], [address] Phone: [phone]   Fax:  [phone]  Physical Therapy Treatment  Patient Details  Name: Jasmine Reeves MRN: [id number] Date of Birth: 26-Apr-1940 Referring Provider (PT): Eldred Manges, MD   Encounter Date: 11/27/2020   PT End of Session - 11/27/20 1516     Visit Number 5    Number of Visits 7    Date for PT Re-Evaluation 12/03/20    Authorization Type [employer]    Authorization Time Period 10/02/20-12/02/20    Authorization - Visit Number 5    Authorization - Number of Visits 12    Progress Note Due on Visit 10    PT Start Time 1513    PT Stop Time 1552    PT Time Calculation (min) 39 min    Activity Tolerance Patient tolerated treatment well    Behavior During Therapy [facility] for tasks assessed/performed             Past Medical History:  Diagnosis Date   COPD (chronic obstructive pulmonary disease) (HCC)    Fibromyalgia    GERD (gastroesophageal reflux disease)    IBS (irritable bowel syndrome)    Scoliosis     Past Surgical History:  Procedure Laterality Date   ABDOMINAL HYSTERECTOMY     APPENDECTOMY     CORONARY ANGIOPLASTY  12/11   CORONARY ARTERY BYPASS GRAFT     CYSTOCELE REPAIR  03/12/2019   cystocele and rectocele repair, Dr. Jennette Kettle   ESOPHAGOGASTRODUODENOSCOPY  07/21/12   polyps, Hiatal hernia Dr. Matthias Hughs   TONSILLECTOMY     TUBAL LIGATION      There were no vitals filed for this visit.   Subjective Assessment - 11/27/20 1510     Subjective feels "about the same"    Pertinent History osteopenia, degenerative scoliosis    Limitations Standing;Walking    How long can you stand comfortably? increased to 15-20 min at least    Patient Stated Goals improve pain with activity    Currently in Pain? Yes    Pain Score 2     Pain Location Back    Pain Orientation Right;Left;Lower    Pain Descriptors / Indicators Aching;Sharp    Pain Type Chronic pain    Pain Onset  More than a month ago    Pain Frequency Intermittent    Aggravating Factors  standing, walking    Pain Relieving Factors sitting, rest                               [facility] Adult PT Treatment/Exercise - 11/27/20 1510       Lumbar Exercises: Aerobic   Nustep L8, 6 x 10 min      Lumbar Exercises: Standing   Heel Raises 20 reps    Functional Squats 20 reps    Row 20 reps;Theraband    Theraband Level (Row) Level 3 (Green)    Other Standing Lumbar Exercises hip abduction and extension x 10 reps each, performed bil      Lumbar Exercises: Seated   Long Arc Quad on Chair Both;10 reps    Sit to Stand 10 reps   no UE support                      PT Short Term Goals - 10/22/20 1549       PT SHORT TERM GOAL #1   Title Ind  with inital HEP    Time 3    Period Weeks    Status Achieved    Target Date 10/23/20      PT SHORT TERM GOAL #2   Title -      PT SHORT TERM GOAL #3   Title -      PT SHORT TERM GOAL #4   Title -      PT SHORT TERM GOAL #5   Title -               PT Long Term Goals - 11/12/20 1511       PT LONG TERM GOAL #1   Title independent with advanced HEP    Time 6    Period Weeks    Status On-going    Target Date 12/03/20      PT LONG TERM GOAL #2   Title demonstrate 4/5 bil hip strength for improved function    Time 6    Period Weeks    Status Achieved      PT LONG TERM GOAL #3   Title report pain < 5/10 with standing for 15 min for improved activity tolerance    Time 6    Period Weeks    Status Achieved      PT LONG TERM GOAL #4   Title FOTO score improved to 60 for improved function    Time 6    Period Weeks    Status On-going    Target Date 12/03/20      PT LONG TERM GOAL #5   Title -                   Plan - 11/27/20 1551     Clinical Impression Statement HEP updated today to reflect progression and more standing exercises which can be incorporated into her daily activiities.   Anticipate d/c next visit.    Personal Factors and Comorbidities Comorbidity 2;Time since onset of injury/illness/exacerbation    Comorbidities osteopenia, degenerative scoliosis    Examination-Activity Limitations Lift;Locomotion Level;Reach Overhead;Transfers;Stand;Caring for Others    Examination-Participation Restrictions Cleaning;Community Activity;Meal Prep    Stability/Clinical Decision Making Evolving/Moderate complexity    Rehab Potential Good    PT Frequency 1x / week    PT Duration 6 weeks    PT Treatment/Interventions ADLs/Self Care Home Management;Cryotherapy;Electrical Stimulation;Moist Heat;Balance training;Therapeutic exercise;Therapeutic activities;Functional mobility training;Gait training;Neuromuscular re-education;Patient/family education;Manual techniques;Taping;Dry needling    PT Next Visit Plan check HEP and plan for d/c    PT Home Exercise Plan Access Code: [email or web address]    Consulted and Agree with Plan of Care Patient             Patient will benefit from skilled therapeutic intervention in order to improve the following deficits and impairments:  Decreased activity tolerance, Decreased strength, Pain, Difficulty walking, Decreased mobility, Decreased balance, Decreased range of motion, Postural dysfunction  Visit Diagnosis: Chronic midline low back pain without sciatica  Muscle weakness (generalized)  Unsteadiness on feet     Problem List Patient Active Problem List   Diagnosis Date Noted   Aortic atherosclerosis (HCC) by Abd CT scan on 02/16/2020 05/11/2020   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   Compression fracture of T12 vertebra (HCC) 02/21/2020   Cystocele with rectocele 12/13/2018   BMI 23.0-23.9, adult 11/01/2017   Osteoporosis with current pathological fracture 04/12/2017   OSA on CPAP 11/27/2014   IBS (irritable bowel syndrome) 11/11/2014   Encounter for Medicare annual wellness exam 11/11/2014   Medication management  04/12/2014   Abnormal blood sugar  10/10/2013   Mixed hyperlipidemia 10/08/2013   CAD (coronary artery disease) 02/03/2013   MVP (mitral valve prolapse) 02/03/2013   Vitamin D deficiency    GERD (gastroesophageal reflux disease)    COPD (chronic obstructive pulmonary disease) (HCC) 02/11/2010   Essential hypertension 01/28/2010   RAYNAUDS SYNDROME 01/28/2010      Clarita Crane, PT, DPT 11/27/20 3:53 PM     [facility] [address] [place], [place], [address] Phone: [phone]   Fax:  [phone]  Name: Jasmine Reeves MRN: [id number] Date of Birth: Aug 12, 1940

## 2020-12-01 ENCOUNTER — Ambulatory Visit: Payer: Medicare PPO | Admitting: Physical Therapy

## 2020-12-01 ENCOUNTER — Other Ambulatory Visit: Payer: Self-pay

## 2020-12-01 ENCOUNTER — Encounter: Payer: Self-pay | Admitting: Physical Therapy

## 2020-12-01 DIAGNOSIS — M545 Low back pain, unspecified: Secondary | ICD-10-CM

## 2020-12-01 DIAGNOSIS — G8929 Other chronic pain: Secondary | ICD-10-CM

## 2020-12-01 DIAGNOSIS — R2681 Unsteadiness on feet: Secondary | ICD-10-CM | POA: Diagnosis not present

## 2020-12-01 DIAGNOSIS — M6281 Muscle weakness (generalized): Secondary | ICD-10-CM | POA: Diagnosis not present

## 2020-12-01 NOTE — Therapy (Signed)
[facility] [address] [place], [place], [address] Phone: [phone]   Fax:  [phone]  Physical Therapy Treatment/Discharge Summary  Patient Details  Name: Jasmine Reeves MRN: [id number] Date of Birth: 01/03/1941 Referring Provider (PT): Yates, Mark C, MD   Encounter Date: 12/01/2020   PT End of Session - 12/01/20 1526     Visit Number 6    Number of Visits 7    Date for PT Re-Evaluation 12/03/20    Authorization Type [employer]    Authorization Time Period 10/02/20-12/02/20    Authorization - Visit Number 6    Authorization - Number of Visits 12    Progress Note Due on Visit 10    PT Start Time 1513    PT Stop Time 1540    PT Time Calculation (min) 27 min    Activity Tolerance Patient tolerated treatment well    Behavior During Therapy WFL for tasks assessed/performed             Past Medical History:  Diagnosis Date   COPD (chronic obstructive pulmonary disease) (HCC)    Fibromyalgia    GERD (gastroesophageal reflux disease)    IBS (irritable bowel syndrome)    Scoliosis     Past Surgical History:  Procedure Laterality Date   ABDOMINAL HYSTERECTOMY     APPENDECTOMY     CORONARY ANGIOPLASTY  12/11   CORONARY ARTERY BYPASS GRAFT     CYSTOCELE REPAIR  03/12/2019   cystocele and rectocele repair, Dr. Neal   ESOPHAGOGASTRODUODENOSCOPY  07/21/12   polyps, Hiatal hernia Dr. Buccini   TONSILLECTOMY     TUBAL LIGATION      There were no vitals filed for this visit.   Subjective Assessment - 12/01/20 1510     Subjective doing well; no new complaints.  did report she didn't have a good weekend, and pain is based on how much she does    Pertinent History osteopenia, degenerative scoliosis    Limitations Standing;Walking    How long can you stand comfortably? increased to 15-20 min at least    Patient Stated Goals improve pain with activity    Currently in Pain? Yes    Pain Score 1    up to 7/10   Pain Location Back    Pain  Orientation Right;Left;Lower    Pain Descriptors / Indicators Aching;Sharp    Pain Type Chronic pain    Pain Onset More than a month ago    Pain Frequency Intermittent    Aggravating Factors  standing, walking    Pain Relieving Factors sitting, rest                [facility] PT Assessment - 12/01/20 1529       Assessment   Medical Diagnosis S22.080A (ICD-10-CM) - Compression fracture of T12 vertebra, initial encounter (HCC)    Referring Provider (PT) Yates, Mark C, MD      Observation/Other Assessments   Focus on Therapeutic Outcomes (FOTO)  45                           [facility] Adult PT Treatment/Exercise - 12/01/20 1518       Lumbar Exercises: Aerobic   Nustep L6 x 10 min      Lumbar Exercises: Standing   Heel Raises 10 reps    Functional Squats 10 reps    Other Standing Lumbar Exercises hip abduction and extension x 10 reps each, performed bil                         PT Short Term Goals - 10/22/20 1549       PT SHORT TERM GOAL #1   Title Ind with inital HEP    Time 3    Period Weeks    Status Achieved    Target Date 10/23/20      PT SHORT TERM GOAL #2   Title -      PT SHORT TERM GOAL #3   Title -      PT SHORT TERM GOAL #4   Title -      PT SHORT TERM GOAL #5   Title -               PT Long Term Goals - 12/01/20 1540       PT LONG TERM GOAL #1   Title independent with advanced HEP    Time 6    Period Weeks    Status Achieved      PT LONG TERM GOAL #2   Title demonstrate 4/5 bil hip strength for improved function    Time 6    Period Weeks    Status Achieved      PT LONG TERM GOAL #3   Title report pain < 5/10 with standing for 15 min for improved activity tolerance    Time 6    Period Weeks    Status Achieved      PT LONG TERM GOAL #4   Title FOTO score improved to 60 for improved function    Time 6    Period Weeks    Status Not Met      PT LONG TERM GOAL #5   Title -                    Plan - 12/01/20 1540     Clinical Impression Statement Pt has met 3/4 LTGs and is pleased with her progress.  She is ready to d/c from PT and it's recommended she continue with daily exercise and activity to maximize function.  FOTO score decreased but likely due to rough weekend impacting score.    Personal Factors and Comorbidities Comorbidity 2;Time since onset of injury/illness/exacerbation    Comorbidities osteopenia, degenerative scoliosis    Examination-Activity Limitations Lift;Locomotion Level;Reach Overhead;Transfers;Stand;Caring for Others    Examination-Participation Restrictions Cleaning;Community Activity;Meal Prep    Stability/Clinical Decision Making Evolving/Moderate complexity    Rehab Potential Good    PT Frequency 1x / week    PT Duration 6 weeks    PT Treatment/Interventions ADLs/Self Care Home Management;Cryotherapy;Electrical Stimulation;Moist Heat;Balance training;Therapeutic exercise;Therapeutic activities;Functional mobility training;Gait training;Neuromuscular re-education;Patient/family education;Manual techniques;Taping;Dry needling    PT Next Visit Plan d/c PT    PT Home Exercise Plan Access Code: [email or web address]    Consulted and Agree with Plan of Care Patient             Patient will benefit from skilled therapeutic intervention in order to improve the following deficits and impairments:  Decreased activity tolerance, Decreased strength, Pain, Difficulty walking, Decreased mobility, Decreased balance, Decreased range of motion, Postural dysfunction  Visit Diagnosis: Chronic midline low back pain without sciatica  Muscle weakness (generalized)  Unsteadiness on feet     Problem List Patient Active Problem List   Diagnosis Date Noted   Aortic atherosclerosis ([place]) by Abd CT scan on 02/16/2020 05/11/2020   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   Compression fracture of T12 vertebra ([place]) 02/21/2020   Cystocele with rectocele 12/13/2018   BMI 23.0-23.9, adult  11/01/2017   Osteoporosis with current  pathological fracture 04/12/2017   OSA on CPAP 11/27/2014   IBS (irritable bowel syndrome) 11/11/2014   Encounter for Medicare annual wellness exam 11/11/2014   Medication management 04/12/2014   Abnormal blood sugar 10/10/2013   Mixed hyperlipidemia 10/08/2013   CAD (coronary artery disease) 02/03/2013   MVP (mitral valve prolapse) 02/03/2013   Vitamin D deficiency    GERD (gastroesophageal reflux disease)    COPD (chronic obstructive pulmonary disease) (HCC) 02/11/2010   Essential hypertension 01/28/2010   RAYNAUDS SYNDROME 01/28/2010      Stephanie F Matthews, PT, DPT 12/01/20 3:45 PM     [facility] [address] [place], [place], [address] Phone: [phone]   Fax:  [phone]  Name: Jasmine Reeves MRN: [id number] Date of Birth: 03/07/1940      PHYSICAL THERAPY DISCHARGE SUMMARY  Visits from Start of Care: 6  Current functional level related to goals / functional outcomes: See above   Remaining deficits: See above   Education / Equipment: HEP   Patient agrees to discharge. Patient goals were met. Patient is being discharged due to being pleased with the current functional level.  Stephanie F Matthews, PT, DPT 12/01/20 3:48 PM    [facility] [address] [place], [place], [address] Phone: [phone]   Fax:  [phone]   

## 2020-12-08 ENCOUNTER — Other Ambulatory Visit: Payer: Self-pay

## 2020-12-08 ENCOUNTER — Ambulatory Visit (INDEPENDENT_AMBULATORY_CARE_PROVIDER_SITE_OTHER): Payer: Medicare PPO | Admitting: Psychologist

## 2020-12-08 DIAGNOSIS — F320 Major depressive disorder, single episode, mild: Secondary | ICD-10-CM

## 2020-12-09 DIAGNOSIS — G629 Polyneuropathy, unspecified: Secondary | ICD-10-CM | POA: Diagnosis not present

## 2020-12-09 DIAGNOSIS — J309 Allergic rhinitis, unspecified: Secondary | ICD-10-CM | POA: Diagnosis not present

## 2020-12-09 DIAGNOSIS — I10 Essential (primary) hypertension: Secondary | ICD-10-CM | POA: Diagnosis not present

## 2020-12-09 DIAGNOSIS — I25119 Atherosclerotic heart disease of native coronary artery with unspecified angina pectoris: Secondary | ICD-10-CM | POA: Diagnosis not present

## 2020-12-09 DIAGNOSIS — E785 Hyperlipidemia, unspecified: Secondary | ICD-10-CM | POA: Diagnosis not present

## 2020-12-09 DIAGNOSIS — G8929 Other chronic pain: Secondary | ICD-10-CM | POA: Diagnosis not present

## 2020-12-09 DIAGNOSIS — G4733 Obstructive sleep apnea (adult) (pediatric): Secondary | ICD-10-CM | POA: Diagnosis not present

## 2020-12-09 DIAGNOSIS — K219 Gastro-esophageal reflux disease without esophagitis: Secondary | ICD-10-CM | POA: Diagnosis not present

## 2020-12-09 DIAGNOSIS — F32A Depression, unspecified: Secondary | ICD-10-CM | POA: Diagnosis not present

## 2020-12-16 ENCOUNTER — Other Ambulatory Visit: Payer: Self-pay

## 2020-12-16 DIAGNOSIS — Z1212 Encounter for screening for malignant neoplasm of rectum: Secondary | ICD-10-CM

## 2020-12-16 DIAGNOSIS — Z1211 Encounter for screening for malignant neoplasm of colon: Secondary | ICD-10-CM

## 2020-12-16 LAB — POC HEMOCCULT BLD/STL (HOME/3-CARD/SCREEN)
Card #2 Fecal Occult Blod, POC: NEGATIVE
Card #3 Fecal Occult Blood, POC: NEGATIVE
Fecal Occult Blood, POC: NEGATIVE

## 2020-12-23 ENCOUNTER — Ambulatory Visit: Payer: Medicare PPO

## 2020-12-23 ENCOUNTER — Other Ambulatory Visit: Payer: Self-pay | Admitting: Internal Medicine

## 2020-12-23 DIAGNOSIS — S22000D Wedge compression fracture of unspecified thoracic vertebra, subsequent encounter for fracture with routine healing: Secondary | ICD-10-CM

## 2020-12-23 DIAGNOSIS — Z780 Asymptomatic menopausal state: Secondary | ICD-10-CM

## 2020-12-31 ENCOUNTER — Ambulatory Visit (INDEPENDENT_AMBULATORY_CARE_PROVIDER_SITE_OTHER): Payer: Medicare PPO | Admitting: Psychologist

## 2020-12-31 ENCOUNTER — Other Ambulatory Visit: Payer: Self-pay

## 2020-12-31 DIAGNOSIS — F320 Major depressive disorder, single episode, mild: Secondary | ICD-10-CM

## 2021-01-01 ENCOUNTER — Other Ambulatory Visit: Payer: Self-pay | Admitting: Adult Health

## 2021-01-01 DIAGNOSIS — I10 Essential (primary) hypertension: Secondary | ICD-10-CM

## 2021-01-01 DIAGNOSIS — E782 Mixed hyperlipidemia: Secondary | ICD-10-CM

## 2021-01-15 DIAGNOSIS — R8290 Unspecified abnormal findings in urine: Secondary | ICD-10-CM | POA: Diagnosis not present

## 2021-01-15 DIAGNOSIS — R350 Frequency of micturition: Secondary | ICD-10-CM | POA: Diagnosis not present

## 2021-01-15 DIAGNOSIS — R82998 Other abnormal findings in urine: Secondary | ICD-10-CM | POA: Diagnosis not present

## 2021-01-15 DIAGNOSIS — N993 Prolapse of vaginal vault after hysterectomy: Secondary | ICD-10-CM | POA: Diagnosis not present

## 2021-01-19 ENCOUNTER — Ambulatory Visit (INDEPENDENT_AMBULATORY_CARE_PROVIDER_SITE_OTHER): Payer: Medicare PPO | Admitting: Psychologist

## 2021-01-19 ENCOUNTER — Other Ambulatory Visit: Payer: Self-pay

## 2021-01-19 DIAGNOSIS — F320 Major depressive disorder, single episode, mild: Secondary | ICD-10-CM

## 2021-01-27 ENCOUNTER — Other Ambulatory Visit: Payer: Self-pay

## 2021-01-27 ENCOUNTER — Ambulatory Visit
Admission: RE | Admit: 2021-01-27 | Discharge: 2021-01-27 | Disposition: A | Discharge status: Home / Self Care | Payer: Medicare PPO | Source: Ambulatory Visit | Attending: Internal Medicine | Admitting: Internal Medicine

## 2021-01-27 DIAGNOSIS — Z1231 Encounter for screening mammogram for malignant neoplasm of breast: Secondary | ICD-10-CM

## 2021-01-29 ENCOUNTER — Other Ambulatory Visit: Payer: Self-pay | Admitting: Internal Medicine

## 2021-01-29 DIAGNOSIS — R928 Other abnormal and inconclusive findings on diagnostic imaging of breast: Secondary | ICD-10-CM

## 2021-02-04 ENCOUNTER — Other Ambulatory Visit: Payer: Self-pay | Admitting: Cardiovascular Disease

## 2021-02-12 ENCOUNTER — Ambulatory Visit: Payer: Medicare PPO | Admitting: Psychologist

## 2021-02-23 ENCOUNTER — Other Ambulatory Visit: Payer: Self-pay | Admitting: Internal Medicine

## 2021-02-23 DIAGNOSIS — S22000D Wedge compression fracture of unspecified thoracic vertebra, subsequent encounter for fracture with routine healing: Secondary | ICD-10-CM

## 2021-02-23 DIAGNOSIS — Z780 Asymptomatic menopausal state: Secondary | ICD-10-CM

## 2021-03-04 ENCOUNTER — Ambulatory Visit
Admission: RE | Admit: 2021-03-04 | Discharge: 2021-03-04 | Disposition: A | Discharge status: Home / Self Care | Payer: Medicare PPO | Source: Ambulatory Visit | Attending: Internal Medicine | Admitting: Internal Medicine

## 2021-03-04 DIAGNOSIS — R922 Inconclusive mammogram: Secondary | ICD-10-CM | POA: Diagnosis not present

## 2021-03-04 DIAGNOSIS — R928 Other abnormal and inconclusive findings on diagnostic imaging of breast: Secondary | ICD-10-CM

## 2021-03-23 ENCOUNTER — Ambulatory Visit: Payer: Medicare PPO | Admitting: Cardiovascular Disease

## 2021-03-23 ENCOUNTER — Encounter: Payer: Self-pay | Admitting: Cardiovascular Disease

## 2021-03-23 ENCOUNTER — Other Ambulatory Visit: Payer: Self-pay

## 2021-03-23 DIAGNOSIS — E785 Hyperlipidemia, unspecified: Secondary | ICD-10-CM | POA: Diagnosis not present

## 2021-03-23 DIAGNOSIS — Z951 Presence of aortocoronary bypass graft: Secondary | ICD-10-CM

## 2021-03-23 DIAGNOSIS — R079 Chest pain, unspecified: Secondary | ICD-10-CM

## 2021-03-23 DIAGNOSIS — G4733 Obstructive sleep apnea (adult) (pediatric): Secondary | ICD-10-CM

## 2021-03-23 DIAGNOSIS — I7300 Raynaud's syndrome without gangrene: Secondary | ICD-10-CM | POA: Diagnosis not present

## 2021-03-23 DIAGNOSIS — R0789 Other chest pain: Secondary | ICD-10-CM

## 2021-03-23 DIAGNOSIS — I350 Nonrheumatic aortic (valve) stenosis: Secondary | ICD-10-CM

## 2021-03-23 DIAGNOSIS — I2510 Atherosclerotic heart disease of native coronary artery without angina pectoris: Secondary | ICD-10-CM | POA: Diagnosis not present

## 2021-03-23 NOTE — Progress Notes (Signed)
Patient ID: Jasmine Reeves, female   DOB: 1940-05-10, 81 y.o.   MRN: [id number]     HPI: Jasmine Reeves is a 81 y.o. female presents for a 12 month follow cardiology evaluation.  [name] underwent CABG revascularization surgery x4 on 03/02/2000 by Dr. Roxy Manns with a LIMA placed her distal LAD, vein to the second diagonal, sequential vein to 2 marginal vessels arising from the circumflex coronary artery. Her RCA was free of disease and was not bypassed. Additional problems include mitral valve prolapse, Raynaud's disease, history of short bursts of nonsustained tachycardia on treadmill testing with reference to this on beta blocker therapy.,. She did develop edema remotely on amlodipine. She hasa history of documented APCs on Holter imaging which have improved with labetalol.  She underwent a nuclear perfusion study on 09/07/2013.  She was noted to have ST segment changes  on ECG but nuclear imaging showed apical thinning with breast attenuation, without ischemia.  She had normal wall motion.  She denies any awareness of palpitations.  She does have issues with some arthritic symptoms.  She takes diltiazem 180 mg in addition to losartan 25 mg, Normodyne 150 mg twice a day and HCTZ 25 mg.  She is on Vytorin 10/20 for hyperlipidemia.  She tells me she was referred for sleep study which was done in [place] after a home study was abnormal.  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 Epworth sleepiness scale score was increased at 16.  CPAP was recommended but this has not yet been initiated.  The patient was not aware that I also am involved in sleep medicine. I reviewed with her current Medicare guidelines.  Lab work by her primary physician in 2016 revealed fasting glucose of 110.  She had normal renal function.  LFTs were normal.  Lipid studies were excellent with a total cholesterol 145,  triglycerides 57, HDL 62, and LDL 72.  Hemoglobin A1c was minimally increased at 5.8.  TSH was 3.029.  When I  saw her in September 2018, she denied any chest pain.  She had experienced a presyncopal spell on a very hot day and her dose of Cardizem and losartan were reduced.  She denies any recurrent palpitations.   Her losartan and labetalol were reduced.  Presently, she is without chest pain.  She denies shortness of breath.  Her blood pressure has tended to be low.  She stopped taking hydrochlorothiazide and is now taking diltiazem 180 mg which is also helpful for her a ray nods phenomena and has only been taking labetalol 100 mg at bedtime and losartan 12.5 mg  in the morning.  She is on CPAP therapy for his obstructive sleep apnea which was diagnosed after a home sleep study in [place].  [employer] is her DME company.  Repeat laboratory by Dr. Melford Aase had shown a total cholesterol 145, HDL 57, triglycerides were increased at 215.  LDL was 60.  I saw her in April 2019.  At that time she was remaining stable.  She not had any symptoms secondary to her Raynaud's phenomena treated with diltiazem.  Her blood pressure was low there was a 10 mm drop in blood pressure from supine to standing and I recommended discontinuance of losartan.  Over the past year and a half, she has done fairly well.  However she has had issues with degenerative scoliosis.  I last saw her on January 15, 2019 at which time she  stated that she had experienced episodes of chest comfort which seem to occur at rest and not with exertion.   She was wondering if these occurred after eating certain foods.  She has noticed some mild ankle swelling.  She has begun to notice more shortness of breath with activity more easily but admits that she has not been as active as she had been doing this Covid pandemic.  She is in need for prolapsed bladder surgery which tentatively is scheduled in on March 12, 2019. She apparently has never seen a  sleep specialist for her sleep apnea and has not been sleeping as well.  She is in need for new supplies and not been using her CPAP recently and would like to reinstitute treatment.  Her DME company is [employer].    Since it had been almost 19 years since her CABG revascularization  I recommended that she undergo a preoperative echo Doppler study and nuclear perfusion study.  The echo Doppler study January 29, 2019 showed normal EF at 60 to 65%.  There was mild aortic stenosis with a mean gradient of 10, and valve area of 1.5 cm.  A nuclear perfusion study on February 05, 2019 was normal without scar or ischemia or ECG changes.  Recent laboratory on December 3 had shown a total cholesterol 152, HDL 64, LDL 74 and triglycerides 59.  I saw her in follow-up of the above studies in December 2020.  I reviewed her preoperative echo Doppler and Lexiscan study prior to her planned bladder surgery for cystocele which was scheduled for January 2021.  I last saw her in March 04, 2020.  At that time she continued to do well from a cardiac standpoint.   Her husband has developed progression of his Alzheimer's disease.  They have recently moved to [facility].  When she was caring for her husband she apparently sustained a 30% fracture at T12 and went to the emergency room in December 2021.  At that time she also was experiencing hemorrhoidal bleeding.  She subsequently saw Liane Comber, NP at Dr. Idell Pickles office on February 25, 2020.  Presently she denies any chest pain.  She is to have an MRI.  She has been wearing a back brace.  She denies any Raynaud's symptomatology.  Her blood pressure was well controlled on diltiazem long-acting 180 mg, labetalol 100 mg at bedtime.  She continues to be on Vytorin 10/20 for hyperlipidemia and LDL cholesterol was 65.  Since I last saw her, she has continued to do well.  She does experience some nonexertional chest pain symptomatology.  Oftentimes she notices  some right greater than left shoulder issues.  She has noticed some mild shortness of breath.  She has had back issues with compression fracture and scoliosis.  She is using CPAP therapy and has an old ResMed air sense 10 AutoSet unit for her from 2016 which is no longer wireless.  She is meeting compliance standards with average use approximately 6 hours per night.  At 8 cm water pressure AHI is excellent at 1.3 on her most recent download from December 25, 122 through March 23, 2021.  She presents for reevaluation.  Past Medical History:  Diagnosis Date   COPD (chronic obstructive pulmonary disease) (HCC)    Fibromyalgia    GERD (gastroesophageal reflux disease)    IBS (irritable bowel syndrome)    Scoliosis     Past Surgical History:  Procedure Laterality Date   ABDOMINAL HYSTERECTOMY  APPENDECTOMY     CORONARY ANGIOPLASTY  12/11   CORONARY ARTERY BYPASS GRAFT     CYSTOCELE REPAIR  03/12/2019   cystocele and rectocele repair, Dr. Nori Riis   ESOPHAGOGASTRODUODENOSCOPY  07/21/12   polyps, Hiatal hernia Dr. Cristina Gong   TONSILLECTOMY     TUBAL LIGATION      Allergies  Allergen Reactions   Altace [Ramipril]     cough   Azithromycin     palpitation   Celebrex [Celecoxib]     Gi upset    Hydrochlorothiazide     cramping   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   Prilosec [Omeprazole]     constipation   Tape Other (See Comments)   Tapentadol     Other reaction(s): Other (See Comments), redness & itch    Current Outpatient Medications  Medication Sig Dispense Refill   alendronate (FOSAMAX) 70 MG tablet Take 1 tablet (70 mg total) by mouth once a week. Take with a full glass of water on an empty stomach. 12 tablet 4   aspirin 81 MG tablet Take 81 mg by mouth daily.     CALCIUM CITRATE PO Take 500 mg by mouth daily.     Cholecalciferol (VITAMIN D) 2000 UNITS tablet Take 2,000 Units by mouth 2 (two) times daily.     diltiazem (DILT-XR) 180 MG 24 hr  capsule TAKE 1 CAPSULE BY MOUTH DAILY 90 capsule 3   docusate sodium (COLACE) 100 MG capsule Take 1 capsule (100 mg total) by mouth every 12 (twelve) hours. 60 capsule 0   estradiol (ESTRACE) 0.1 MG/GM vaginal cream estradiol 0.01% (0.1 mg/gram) vaginal cream  INSERT 2 GRAMS INTO THE VAGINA DAILY FOR FIRST WEEK THEN TWICE WEEKLY AFTER FIRST WEEK     ezetimibe-simvastatin (VYTORIN) 10-20 MG tablet TAKE 1 TABLET BY MOUTH DAILY FOR CHOLESTEROL 90 tablet 1   fexofenadine (ALLEGRA) 180 MG tablet Take 180 mg by mouth daily.     fluticasone (VERAMYST) 27.5 MCG/SPRAY nasal spray Place 2 sprays into the nose daily as needed for rhinitis. 10 g 8   IRON PO Take 50 mg by mouth daily.     labetalol (NORMODYNE) 100 MG tablet TAKE 1 TABLET(100 MG) BY MOUTH AT BEDTIME 90 tablet 3   Magnesium 400 MG CAPS Take by mouth daily.     meloxicam (MOBIC) 15 MG tablet Take 1/2 to 1 tablet Daily with Food for Pain & Inflammation 90 tablet 3   methocarbamol (ROBAXIN) 500 MG tablet Take 1 tablet (500 mg total) by mouth every 8 (eight) hours as needed. For muscle spasms. Do not take and drive. 60 tablet 0   Multiple Vitamins-Minerals (MULTIVITAMIN WITH MINERALS) tablet Take 1 tablet by mouth daily.     nitroGLYCERIN (NITROSTAT) 0.4 MG SL tablet Place 1 tablet (0.4 mg total) under the tongue every 5 (five) minutes as needed for chest pain. 25 tablet 12   pantoprazole (PROTONIX) 40 MG tablet TAKE 1 TABLET BY MOUTH TWICE DAILY FOR ACID INDIGESTION AND REFLUX 180 tablet 3   vitamin C (ASCORBIC ACID) 500 MG tablet Take 500 mg by mouth daily.     Zinc 50 MG TABS Take by mouth daily.     No current facility-administered medications for this visit.    Social History   Socioeconomic History   Marital status: Married    Spouse name: Not on file   Number of children: Not on file   Years of education: Not on file  Highest education level: Not on file  Occupational History   Not on file  Tobacco Use   Smoking status: Former     Types: Cigarettes    Quit date: 09/03/1965    Years since quitting: 55.6   Smokeless tobacco: Never  Substance and Sexual Activity   Alcohol use: Yes    Alcohol/week: 3.0 standard drinks    Types: 3 Standard drinks or equivalent per week    Comment: rare   Drug use: No   Sexual activity: Not on file  Other Topics Concern   Not on file  Social History Narrative   Not on file   Social Determinants of Health   Financial Resource Strain: Not on file  Food Insecurity: Not on file  Transportation Needs: Not on file  Physical Activity: Not on file  Stress: Not on file  Social Connections: Not on file  Intimate Partner Violence: Not on file   Socially she is married has 2 children 3 grandchildren. She does not routinely exercise. There is no tobacco use. She does drink occasional alcohol. She is retired Systems analyst.  Family History  Problem Relation Age of Onset   Heart disease Brother    Hyperlipidemia Brother    ROS General: Negative; No fevers, chills, or night sweats;  HEENT: Negative; No changes in vision or hearing, sinus congestion, difficulty swallowing Pulmonary: Negative; No cough, wheezing, shortness of breath, hemoptysis Cardiovascular: Negative; No chest pain, presyncope, syncope, palpatations GI: Negative; No nausea, vomiting, diarrhea, or abdominal pain GU: Negative; No dysuria, hematuria, or difficulty voiding Musculoskeletal: Positive for a Raynaud's; back issues with compression fracture Hematologic/Oncology: Negative; no easy bruising, bleeding Endocrine: Negative; no heat/cold intolerance; no diabetes Neuro: Negative; no changes in balance, headaches Skin: Negative; No rashes or skin lesions Psychiatric: Negative; No behavioral problems, depression Sleep: Positive for obstructive sleep apnea on CPAP;  in need of new supplies as result has not been using Other comprehensive 14 point system review is negative.  PE BP 120/60    Pulse 72     Ht 5' 1"  (1.549 m)    Wt 142 lb 3.2 oz (64.5 kg)    SpO2 97%    BMI 26.87 kg/m    Repeat blood pressure was 124/64  Wt Readings from Last 3 Encounters:  03/23/21 142 lb 3.2 oz (64.5 kg)  11/17/20 140 lb 3.2 oz (63.6 kg)  09/02/20 142 lb 9.6 oz (64.7 kg)   General: Alert, oriented, no distress.  Skin: normal turgor, no rashes, warm and dry HEENT: Normocephalic, atraumatic. Pupils equal round and reactive to light; sclera anicteric; extraocular muscles intact;  Nose without nasal septal hypertrophy Mouth/Parynx benign; Mallinpatti scale 3 Neck: No JVD, no carotid bruits; normal carotid upstroke Lungs: clear to ausculatation and percussion; no wheezing or rales Chest wall: without tenderness to palpitation Heart: PMI not displaced, RRR, s1 s2 normal, 1/6 systolic murmur, no diastolic murmur, no rubs, gallops, thrills, or heaves Abdomen: soft, nontender; no hepatosplenomehaly, BS+; abdominal aorta nontender and not dilated by palpation. Back: no CVA tenderness Pulses 2+ Musculoskeletal: full range of motion, normal strength, no joint deformities Extremities: no clubbing cyanosis or edema, Homan's sign negative  Neurologic: grossly nonfocal; Cranial nerves grossly wnl Psychologic: Normal mood and affect   March 23, 2021 ECG (independently read by me):  NSR at 68 with sinus arrythmia  March 04, 2020 ECG (independently read by me):Sinus rhythm at 75 with [facility]  February 14, 2020 ECG (independently read by me): Normal sinus  rhythm at 64 bpm.  Isolated PAC, nonspecific ST changes.  Normal intervals.  November 2020 ECG (independently read by me): Sinus rhythm at 61 bpm with mild sinus arrhythmia.  Normal intervals.  No ST segment changes  April 2019  ECG (independently read by me): Sinus bradycardia 58 bpm.  No ectopy.  Normal intervals.  September 2018 ECG (independently read by me): Normal sinus rhythm at 62 bpm.  Nonspecific T-wave abnormality  September 2017 ECG (independently  read by me): Sinus bradycardia 56 bpm with mild sinus arrhythmia.  Nonspecific T changes.  September 2016 ECG (independently read by me): Sinus bradycardia with sinus arrhythmia.  No significant ST-T changes.  August 2015 ECG: Sinus rhythm at 51 beats per minute. Non-specific T changes.  LABS: BMP Latest Ref Rng & Units 11/17/2020 09/02/2020 08/14/2020  Glucose 65 - 99 mg/dL 83 85 86  BUN 7 - 25 mg/dL 15 17 27(H)  Creatinine 0.60 - 1.00 mg/dL 0.67 0.76 0.93  BUN/Creat Ratio 6 - 22 (calc) NOT APPLICABLE NOT APPLICABLE 01(X)  Sodium 135 - 146 mmol/L 142 141 141  Potassium 3.5 - 5.3 mmol/L 4.0 4.0 4.3  Chloride 98 - 110 mmol/L 106 104 105  CO2 20 - 32 mmol/L 27 27 28   Calcium 8.6 - 10.4 mg/dL 9.3 9.0 9.3   Hepatic Function Latest Ref Rng & Units 11/17/2020 08/14/2020 05/12/2020  Total Protein 6.1 - 8.1 g/dL 6.7 6.3 6.4  Albumin 3.5 - 5.0 g/dL - - -  AST 10 - 35 U/L 22 20 20   ALT 6 - 29 U/L 19 20 17   Alk Phosphatase 38 - 126 U/L - - -  Total Bilirubin 0.2 - 1.2 mg/dL 0.6 0.5 0.6  Bilirubin, Direct 0.0 - 0.2 mg/dL - - -   CBC Latest Ref Rng & Units 11/17/2020 08/14/2020 05/12/2020  WBC 3.8 - 10.8 Thousand/uL 5.5 5.4 5.9  Hemoglobin 11.7 - 15.5 g/dL 13.0 12.3 12.4  Hematocrit 35.0 - 45.0 % 39.0 36.7 37.2  Platelets 140 - 400 Thousand/uL 199 190 192   Lab Results  Component Value Date   MCV 92.6 11/17/2020   MCV 93.1 08/14/2020   MCV 91.6 05/12/2020   Lab Results  Component Value Date   TSH 3.16 11/17/2020   Lab Results  Component Value Date   HGBA1C 5.5 11/17/2020   Lipid Panel     Component Value Date/Time   CHOL 150 11/17/2020 1054   TRIG 74 11/17/2020 1054   HDL 68 11/17/2020 1054   CHOLHDL 2.2 11/17/2020 1054   VLDL 22 10/04/2016 1114   LDLCALC 67 11/17/2020 1054    RADIOLOGY: No results found.  IMPRESSION:  1. CAD in native artery   2. Hx of CABG   3. Chest pain, unspecified type   4. Hyperlipidemia with target LDL less than 70   5. OSA (obstructive sleep  apnea)   6. Mild aortic stenosis   7. Raynaud's disease without gangrene      ASSESSMENT AND PLAN: Ms. Daria Mcmeekin is a 81 years old Caucasian female who underwent cardiac catheterization on New Year's Eve 2001.  Catheterization revealed severe two-vessel coronary artery disease and she underwent CABG revascularization surgery on 03/02/2000. Her last catheterization in 2008 showed patent grafts. She has normal LV function and mitral valve prolapse.   A nuclear perfusion study in 2015 was unchanged and continued to show normal perfusion without scar or ischemia.  She did have exercise-induced ST changes.  She has a history of Raynaud's phenomena which  has successfully been treated with her diltiazem.  Her most recent nuclear and echo studies were done in late 2020 prior to her planned bladder surgery.  She again was documented to have normal systolic function and had mild aortic stenosis with a mean gradient of 10 and peak gradient of 14 mm.  Estimated aortic valve area was 1.59 cm.  Nuclear study continued to show normal perfusion.   She sustained a 30% partial fracture at T12 when she was helping care for her husband who is developed progressive Parkinson's debility.  She is not having any anginal symptomatology.  She will be seeing Dr. Maudie Mercury at [facility] who has taken care of her son who had rectal cancer.  She also will have an MRI.  Recently, she has noticed some nonexertional chest pain and admits to some left arm aches which may be shoulder issues.  She is now 21 years following her coronary revascularization and over 2 years since her last nuclear study.  I am recommending she undergo a 2-1/2-year follow-up Lexiscan Myoview study as well as echocardiographic evaluation over the next 3 months.  Her blood pressure today is stable on her current regimen.  She continues to be on CPAP for her obstructive sleep apnea.  With her machine no longer wireless from 2016, I am prescribing a new ResMed air sense  11 CPAP auto unit.  She is aware of supply chain issues and the potential of not having this be available for 4 to 6 months.  She sees Dr. Melford Aase for primary care who has been checking laboratory.  LDL cholesterol in September 2022 was excellent at 67 on Zetia/simvastatin 10/20 daily.  She continues to be on pantoprazole for GERD.   Troy Sine, MD, [facility]  04/04/2021 4:51 PM

## 2021-03-23 NOTE — Patient Instructions (Addendum)
Medication Instructions:  No changes  *If you need a refill on your cardiac medications before your next appointment, please call your pharmacy*   Lab Work: Not needed If you have labs (blood work) drawn today and your tests are completely normal, you will receive your results only by: MyChart Message (if you have MyChart) OR A paper copy in the mail If you have any lab test that is abnormal or we need to change your treatment, we will call you to review the results.   Testing/Procedures:  Will be schedule in March or April 2023 Both at [address]  Your physician has requested that you have an echocardiogram. Echocardiography is a painless test that uses sound waves to create images of your heart. It provides your doctor with information about the size and shape of your heart and how well your hearts chambers and valves are working. This procedure takes approximately one hour. There are no restrictions for this procedure.  And  Your physician has requested that you have a lexiscan myoview. Please follow instruction sheet, as given.   Follow-Up: At [facility], you and your health needs are our priority.  As part of our continuing mission to provide you with exceptional heart care, we have created designated Provider Care Teams.  These Care Teams include your primary Cardiologist (physician) and Advanced Practice Providers (APPs -  Physician Assistants and Nurse Practitioners) who all work together to provide you with the care you need, when you need it.     Your next appointment:   3 month(s)  The format for your next appointment:   In Person  Provider:   None    Other Instructions   No pressure changes were done to your C-PAP - a new order will be placed for a New Resp-med 11 machine

## 2021-03-26 ENCOUNTER — Telehealth: Payer: Self-pay | Admitting: *Deleted

## 2021-03-26 DIAGNOSIS — G4733 Obstructive sleep apnea (adult) (pediatric): Secondary | ICD-10-CM

## 2021-03-26 NOTE — Telephone Encounter (Deleted)
-----   Message from Gaynelle Cage, [place] sent at 03/23/2021  3:23 PM EST ----- Airsense 11 auto unit

## 2021-03-26 NOTE — Telephone Encounter (Signed)
Dr Tresa Endo pt.

## 2021-04-01 DIAGNOSIS — H2513 Age-related nuclear cataract, bilateral: Secondary | ICD-10-CM | POA: Diagnosis not present

## 2021-04-01 DIAGNOSIS — R7309 Other abnormal glucose: Secondary | ICD-10-CM | POA: Diagnosis not present

## 2021-04-01 DIAGNOSIS — H04123 Dry eye syndrome of bilateral lacrimal glands: Secondary | ICD-10-CM | POA: Diagnosis not present

## 2021-04-01 DIAGNOSIS — H10413 Chronic giant papillary conjunctivitis, bilateral: Secondary | ICD-10-CM | POA: Diagnosis not present

## 2021-04-01 LAB — HM DIABETES EYE EXAM

## 2021-04-04 ENCOUNTER — Encounter: Payer: Self-pay | Admitting: Cardiovascular Disease

## 2021-05-04 ENCOUNTER — Telehealth (HOSPITAL_COMMUNITY): Payer: Self-pay | Admitting: *Deleted

## 2021-05-04 NOTE — Telephone Encounter (Signed)
Patient given detailed instructions per Myocardial Perfusion Study Information Sheet for the test on 05/11/2021 at 10:30. Patient notified to arrive 15 minutes early and that it is imperative to arrive on time for appointment to keep from having the test rescheduled. ? If you need to cancel or reschedule your appointment, please call the office within 24 hours of your appointment. . Patient verbalized understanding.Jasmine Reeves ? ? ?

## 2021-05-05 DIAGNOSIS — N949 Unspecified condition associated with female genital organs and menstrual cycle: Secondary | ICD-10-CM | POA: Diagnosis not present

## 2021-05-05 DIAGNOSIS — R399 Unspecified symptoms and signs involving the genitourinary system: Secondary | ICD-10-CM | POA: Diagnosis not present

## 2021-05-05 DIAGNOSIS — N993 Prolapse of vaginal vault after hysterectomy: Secondary | ICD-10-CM | POA: Diagnosis not present

## 2021-05-05 DIAGNOSIS — Z4689 Encounter for fitting and adjustment of other specified devices: Secondary | ICD-10-CM | POA: Diagnosis not present

## 2021-05-05 DIAGNOSIS — N952 Postmenopausal atrophic vaginitis: Secondary | ICD-10-CM | POA: Insufficient documentation

## 2021-05-05 DIAGNOSIS — Z960 Presence of urogenital implants: Secondary | ICD-10-CM | POA: Diagnosis not present

## 2021-05-11 ENCOUNTER — Ambulatory Visit (HOSPITAL_BASED_OUTPATIENT_CLINIC_OR_DEPARTMENT_OTHER): Payer: Medicare PPO

## 2021-05-11 ENCOUNTER — Ambulatory Visit (HOSPITAL_COMMUNITY): Payer: Medicare PPO | Attending: Internal Medicine

## 2021-05-11 ENCOUNTER — Other Ambulatory Visit: Payer: Self-pay

## 2021-05-11 DIAGNOSIS — I2510 Atherosclerotic heart disease of native coronary artery without angina pectoris: Secondary | ICD-10-CM | POA: Insufficient documentation

## 2021-05-11 DIAGNOSIS — I350 Nonrheumatic aortic (valve) stenosis: Secondary | ICD-10-CM

## 2021-05-11 DIAGNOSIS — Z951 Presence of aortocoronary bypass graft: Secondary | ICD-10-CM | POA: Insufficient documentation

## 2021-05-11 DIAGNOSIS — R079 Chest pain, unspecified: Secondary | ICD-10-CM | POA: Insufficient documentation

## 2021-05-11 DIAGNOSIS — G4733 Obstructive sleep apnea (adult) (pediatric): Secondary | ICD-10-CM | POA: Diagnosis not present

## 2021-05-11 LAB — ECHOCARDIOGRAM COMPLETE
AR max vel: 1.65 cm2
AV Area VTI: 1.46 cm2
AV Area mean vel: 1.6 cm2
AV Mean grad: 7 mmHg
AV Peak grad: 13.4 mmHg
Ao pk vel: 1.83 m/s
Area-P 1/2: 2.83 cm2
S' Lateral: 3.1 cm

## 2021-05-11 LAB — MYOCARDIAL PERFUSION IMAGING
LV dias vol: 67 mL (ref 46–106)
LV sys vol: 20 mL
Nuc Stress EF: 71 %
Peak HR: 85 {beats}/min
Rest HR: 62 {beats}/min
Rest Nuclear Isotope Dose: 10.5 mCi
SDS: 0
SRS: 0
SSS: 0
Stress Nuclear Isotope Dose: 32.3 mCi
TID: 1.08

## 2021-05-11 MED ORDER — TECHNETIUM TC 99M TETROFOSMIN IV KIT
10.5000 | PACK | Freq: Once | INTRAVENOUS | Status: AC | PRN
  Administered 2021-05-11: 10.5 via INTRAVENOUS
  Filled 2021-05-11: qty 11

## 2021-05-11 MED ORDER — TECHNETIUM TC 99M TETROFOSMIN IV KIT
32.3000 | PACK | Freq: Once | INTRAVENOUS | Status: AC | PRN
  Administered 2021-05-11: 32.3 via INTRAVENOUS
  Filled 2021-05-11: qty 33

## 2021-05-11 MED ORDER — REGADENOSON 0.4 MG/5ML IV SOLN
0.4000 mg | Freq: Once | INTRAVENOUS | Status: AC
  Administered 2021-05-11: 0.4 mg via INTRAVENOUS

## 2021-05-13 DIAGNOSIS — Z1283 Encounter for screening for malignant neoplasm of skin: Secondary | ICD-10-CM | POA: Diagnosis not present

## 2021-05-13 DIAGNOSIS — L304 Erythema intertrigo: Secondary | ICD-10-CM | POA: Diagnosis not present

## 2021-05-13 DIAGNOSIS — L821 Other seborrheic keratosis: Secondary | ICD-10-CM | POA: Diagnosis not present

## 2021-05-21 NOTE — Progress Notes (Signed)
?3 MONTH FOLLOW UP ? ?Assessment:  ? ? ? ?Atherosclerosis of aorta ?Per CT 2018 ?Control blood pressure, cholesterol, glucose, increase exercise.  ? ?Coronary artery disease involving native coronary artery of native heart with other form of angina pectoris ([place]) ?Control blood pressure, cholesterol, glucose, increase exercise.  ?Prescribed nitroglycerine without recent use ?Followed by cardiology ? ?Essential hypertension ?At goal; continue medications ?Monitor blood pressure at home; call if consistently over 130/80 ?Continue DASH diet.   ?Reminder to go to the ER if any CP, SOB, nausea, dizziness, severe HA, changes vision/speech, left arm numbness and tingling and jaw pain. ? ?Chronic obstructive pulmonary disease, unspecified COPD type ([place]) ?By imaging; denies notable symptoms of dyspnea, secretions, continue to monitor. Remote hx of smoking.  ? ?OSA on CPAP ?Reports typically wears 100% of the time and endorses restorative sleep ? ?Gastroesophageal reflux disease, esophagitis presence not specified ?Taking PPI BID, H2i didn't help; carafate PRN, GI following ?Discussed diet, avoiding triggers and other lifestyle changes ? ?Abnormal blood sugar ?Has been prediabetic; most recent A1C at goal ?Discussed disease and risks ?Discussed diet/exercise, weight management  ? ?Medication management ?-     CBC with Differential/Platelet ?-     CMP/GFR ?-     Magnesium  ? ?Mixed hyperlipidemia ?At goal; continue medication ?Continue low cholesterol diet and exercise.  ?-     Lipid panel ?-     TSH ? ?Vitamin D deficiency ?At goal at recent check; continue to recommend supplementation for goal of 70-100 ?Defer vitamin D level ? ?Post nasal drip ?Switch from Allegra to Zyrtec and take flonase regularly for the next 2 weeks ?If symptoms are not improving will further workup ? ? ?Over 40 minutes of exam, counseling, chart review and critical decision making was performed ?Future Appointments  ?Date Time Provider [facility]  ?05/26/2021  2:30 PM GI-BCG DX DEXA 1 GI-BCGDG GI-BRE[facility]  ?06/17/2021 11:40 AM Troy Sine, MD [facility]  ?08/11/2021  2:00 PM Marybelle Killings, MD [facility] None  ?08/18/2021  2:30 PM Liane Comber, NP [facility] None  ?11/17/2021 11:00 AM Unk Pinto, MD [facility] None  ? ? ? ?Subjective:  ?Jasmine DECH is a 81 y.o. female who presents for 3 month follow up.   ? ?She is primary caregiver for husband, he has Parkinson's and symptoms are worsening. He had 4 falls recently.  Requiring more care.  ? ?She does have osteoporosis but does forget to take her Fosamax. She has DEXA tomorrow.  ? ?Hx of 2 vaginal deliveries, s/p hysterectomy with recently s/p surgical repair for cystocele/rectocele, "bladder tack" on 03/12/2019 by Dr. Nori Riis. Had some complications, was evaluated by Dr. Sherrian Divers affiliated with [facility] in 09/2019 with recurrent prolapse, has recommended pelvic floor physical therapy (with Earlie Counts), pessary, vaginal estrogens, trying to avoid repeat surgery.  Dr. Zigmund Daniel is now following her.  A few weeks ago she was having vaginal pain- pessary was in wrong position.  They removed pessary and she has kept it out.  Has appointment in 06/2021 with Dr. Zigmund Daniel and in 2 weeks with the PA to determine if she wants pessary reinserted or surgery. She has lost a little bit of bowel control.   ? ?she has a diagnosis of GERD which is currently managed by protonix 40 mg BID, has follow up with GI. Is to have one more colonoscopy.   ? ?She has been having a lot of post nasal drip.  Has been taking Fexofenadine for a  long time.  Also uses Flonase but not regularly. ? ?BMI is Body mass index is 27.28 kg/m?., she has been working on diet, no exercise recently due to husband's health.  ?Wt Readings from Last 3 Encounters:  ?05/25/21 144 lb 6.4 oz (65.5 kg)  ?05/11/21 142 lb (64.4 kg)  ?03/23/21 142 lb 3.2 oz (64.5 kg)  ? ?She has hx of PCA x 2 In Nov & Dec 2001, and CABG in Jan 2002. , she underwent  CABG. Had reassuring myocardial perfusion and ECHO in 12/2018 other than known MVP and mild aortic stenosis Continues to follow up with Dr. Claiborne Billings. ?She has aortic atherosclerosis and COPD per CXR in 2018/2019 ? Her blood pressure has been controlled at home, today their BP is BP: 116/72  ?BP Readings from Last 3 Encounters:  ?05/25/21 116/72  ?03/23/21 120/60  ?11/17/20 118/72  ? ? ?She does not workout. She denies chest pain, shortness of breath, dizziness.  ? ?She is on cholesterol medication (vytorin 10/20) and denies myalgias. Her cholesterol is at goal. The cholesterol last visit was:   ?Lab Results  ?Component Value Date  ? CHOL 150 11/17/2020  ? HDL 68 11/17/2020  ? [place] 67 11/17/2020  ? TRIG 74 11/17/2020  ? CHOLHDL 2.2 11/17/2020  ? ? She has been working on diet and exercise for glucose management, and denies increased appetite, nausea, paresthesia of the feet, polydipsia, polyuria, visual disturbances, vomiting and weight loss. Last A1C in the office was:  ?Lab Results  ?Component Value Date  ? HGBA1C 5.5 11/17/2020  ? 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 GFRNONAA 76 09/02/2020  ? ?Patient is on Vitamin D supplement and at goal at most recent check:  ?Lab Results  ?Component Value Date  ? VD25OH 80 11/17/2020  ?   ? ?Medication Review: ?Current Outpatient Medications on File Prior to Visit  ?Medication Sig Dispense Refill  ? alendronate (FOSAMAX) 70 MG tablet Take 1 tablet (70 mg total) by mouth once a week. Take with a full glass of water on an empty stomach. 12 tablet 4  ? aspirin 81 MG tablet Take 81 mg by mouth daily.    ? CALCIUM CITRATE PO Take 500 mg by mouth daily.    ? Cholecalciferol (VITAMIN D) 2000 UNITS tablet Take 2,000 Units by mouth 2 (two) times daily.    ? diltiazem (DILT-XR) 180 MG 24 hr capsule TAKE 1 CAPSULE BY MOUTH DAILY 90 capsule 3  ? docusate sodium (COLACE) 100 MG capsule Take 1 capsule (100 mg total) by mouth every 12 (twelve) hours. 60  capsule 0  ? estradiol (ESTRACE) 0.1 MG/GM vaginal cream estradiol 0.01% (0.1 mg/gram) vaginal cream ? INSERT 2 GRAMS INTO THE VAGINA DAILY FOR FIRST WEEK THEN TWICE WEEKLY AFTER FIRST WEEK    ? ezetimibe-simvastatin (VYTORIN) 10-20 MG tablet TAKE 1 TABLET BY MOUTH DAILY FOR CHOLESTEROL 90 tablet 1  ? fexofenadine (ALLEGRA) 180 MG tablet Take 180 mg by mouth daily.    ? fluticasone (VERAMYST) 27.5 MCG/SPRAY nasal spray Place 2 sprays into the nose daily as needed for rhinitis. 10 g 8  ? IRON PO Take 50 mg by mouth daily.    ? labetalol (NORMODYNE) 100 MG tablet TAKE 1 TABLET(100 MG) BY MOUTH AT BEDTIME 90 tablet 3  ? Magnesium 400 MG CAPS Take by mouth daily.    ? methocarbamol (ROBAXIN) 500 MG tablet Take 1 tablet (500 mg total) by mouth every 8 (  eight) hours as needed. For muscle spasms. Do not take and drive. 60 tablet 0  ? Multiple Vitamins-Minerals (MULTIVITAMIN WITH MINERALS) tablet Take 1 tablet by mouth daily.    ? nitroGLYCERIN (NITROSTAT) 0.4 MG SL tablet Place 1 tablet (0.4 mg total) under the tongue every 5 (five) minutes as needed for chest pain. 25 tablet 12  ? pantoprazole (PROTONIX) 40 MG tablet TAKE 1 TABLET BY MOUTH TWICE DAILY FOR ACID INDIGESTION AND REFLUX 180 tablet 3  ? vitamin C (ASCORBIC ACID) 500 MG tablet Take 500 mg by mouth daily.    ? Zinc 50 MG TABS Take by mouth daily.    ? meloxicam (MOBIC) 15 MG tablet Take 1/2 to 1 tablet Daily with Food for Pain & Inflammation (Patient not taking: Reported on 05/25/2021) 90 tablet 3  ? ?No current facility-administered medications on file prior to visit.  ? ? ?Allergies  ?Allergen Reactions  ? Altace [Ramipril]   ?  cough  ? Azithromycin   ?  palpitation  ? Celebrex [Celecoxib]   ?  Gi upset   ? Hydrochlorothiazide   ?  cramping  ? Ppd [Tuberculin Purified Protein Derivative]   ?  Positive PPD 1998  ? Prilosec [Omeprazole]   ?  constipation  ? Tape Other (See Comments)  ? Tapentadol   ?  Other reaction(s): Other (See Comments), redness & itch   ? ? ?Current Problems (verified) ?Patient Active Problem List  ? Diagnosis Date Noted  ? Aortic atherosclerosis ([place]) by Abd CT scan on 02/16/2020 05/11/2020  ? 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 Compression fractu

## 2021-05-25 ENCOUNTER — Other Ambulatory Visit: Payer: Self-pay

## 2021-05-25 ENCOUNTER — Ambulatory Visit: Payer: Medicare PPO | Admitting: Nurse Practitioner

## 2021-05-25 ENCOUNTER — Encounter: Payer: Self-pay | Admitting: Nurse Practitioner

## 2021-05-25 VITALS — BP 116/72 | HR 77 | Temp 97.5°F | Wt 144.4 lb

## 2021-05-25 DIAGNOSIS — Z9989 Dependence on other enabling machines and devices: Secondary | ICD-10-CM

## 2021-05-25 DIAGNOSIS — I700 Atherosclerosis of aorta: Secondary | ICD-10-CM | POA: Diagnosis not present

## 2021-05-25 DIAGNOSIS — I10 Essential (primary) hypertension: Secondary | ICD-10-CM

## 2021-05-25 DIAGNOSIS — G4733 Obstructive sleep apnea (adult) (pediatric): Secondary | ICD-10-CM | POA: Diagnosis not present

## 2021-05-25 DIAGNOSIS — I25118 Atherosclerotic heart disease of native coronary artery with other forms of angina pectoris: Secondary | ICD-10-CM

## 2021-05-25 DIAGNOSIS — R7309 Other abnormal glucose: Secondary | ICD-10-CM | POA: Diagnosis not present

## 2021-05-25 DIAGNOSIS — K219 Gastro-esophageal reflux disease without esophagitis: Secondary | ICD-10-CM | POA: Diagnosis not present

## 2021-05-25 DIAGNOSIS — E559 Vitamin D deficiency, unspecified: Secondary | ICD-10-CM | POA: Diagnosis not present

## 2021-05-25 DIAGNOSIS — J449 Chronic obstructive pulmonary disease, unspecified: Secondary | ICD-10-CM | POA: Diagnosis not present

## 2021-05-25 DIAGNOSIS — E782 Mixed hyperlipidemia: Secondary | ICD-10-CM | POA: Diagnosis not present

## 2021-05-25 DIAGNOSIS — Z79899 Other long term (current) drug therapy: Secondary | ICD-10-CM

## 2021-05-25 DIAGNOSIS — R0982 Postnasal drip: Secondary | ICD-10-CM

## 2021-05-26 ENCOUNTER — Ambulatory Visit
Admission: RE | Admit: 2021-05-26 | Discharge: 2021-05-26 | Disposition: A | Discharge status: Home / Self Care | Payer: Medicare PPO | Source: Ambulatory Visit | Attending: Internal Medicine | Admitting: Internal Medicine

## 2021-05-26 DIAGNOSIS — M8589 Other specified disorders of bone density and structure, multiple sites: Secondary | ICD-10-CM | POA: Diagnosis not present

## 2021-05-26 DIAGNOSIS — S22000D Wedge compression fracture of unspecified thoracic vertebra, subsequent encounter for fracture with routine healing: Secondary | ICD-10-CM

## 2021-05-26 DIAGNOSIS — Z780 Asymptomatic menopausal state: Secondary | ICD-10-CM | POA: Diagnosis not present

## 2021-05-26 LAB — COMPLETE METABOLIC PANEL WITH GFR
AG Ratio: 1.9 (calc) (ref 1.0–2.5)
ALT: 20 U/L (ref 6–29)
AST: 22 U/L (ref 10–35)
Albumin: 4.3 g/dL (ref 3.6–5.1)
Alkaline phosphatase (APISO): 59 U/L (ref 37–153)
BUN: 15 mg/dL (ref 7–25)
CO2: 27 mmol/L (ref 20–32)
Calcium: 9.1 mg/dL (ref 8.6–10.4)
Chloride: 106 mmol/L (ref 98–110)
Creat: 0.8 mg/dL (ref 0.60–1.00)
Globulin: 2.3 g/dL (calc) (ref 1.9–3.7)
Glucose, Bld: 83 mg/dL (ref 65–99)
Potassium: 4.2 mmol/L (ref 3.5–5.3)
Sodium: 142 mmol/L (ref 135–146)
Total Bilirubin: 0.5 mg/dL (ref 0.2–1.2)
Total Protein: 6.6 g/dL (ref 6.1–8.1)
eGFR: 75 mL/min/{1.73_m2} (ref >=60)

## 2021-05-26 LAB — CBC WITH DIFFERENTIAL/PLATELET
Absolute Monocytes: 515 cells/uL (ref 200–950)
Basophils Absolute: 50 cells/uL (ref 0–200)
Basophils Relative: 0.9 %
Eosinophils Absolute: 151 cells/uL (ref 15–500)
Eosinophils Relative: 2.7 %
HCT: 40.2 % (ref 35.0–45.0)
Hemoglobin: 13.2 g/dL (ref 11.7–15.5)
Lymphs Abs: 1568 cells/uL (ref 850–3900)
MCH: 30.7 pg (ref 27.0–33.0)
MCHC: 32.8 g/dL (ref 32.0–36.0)
MCV: 93.5 fL (ref 80.0–100.0)
MPV: 10 fL (ref 7.5–12.5)
Monocytes Relative: 9.2 %
Neutro Abs: 3315 cells/uL (ref 1500–7800)
Neutrophils Relative %: 59.2 %
Platelets: 185 10*3/uL (ref 140–400)
RBC: 4.3 10*6/uL (ref 3.80–5.10)
RDW: 11.8 % (ref 11.0–15.0)
Total Lymphocyte: 28 %
WBC: 5.6 10*3/uL (ref 3.8–10.8)

## 2021-05-26 LAB — HEMOGLOBIN A1C
Hgb A1c MFr Bld: 5.8 % of total Hgb — ABNORMAL HIGH (ref <5.7)
Mean Plasma Glucose: 120 mg/dL
eAG (mmol/L): 6.6 mmol/L

## 2021-05-26 LAB — LIPID PANEL
Cholesterol: 156 mg/dL (ref <200)
HDL: 68 mg/dL (ref >=50)
LDL Cholesterol (Calc): 69 mg/dL (calc)
Non-HDL Cholesterol (Calc): 88 mg/dL (calc) (ref <130)
Total CHOL/HDL Ratio: 2.3 (calc) (ref <5.0)
Triglycerides: 109 mg/dL (ref <150)

## 2021-05-26 LAB — MAGNESIUM: Magnesium: 2.2 mg/dL (ref 1.5–2.5)

## 2021-05-26 LAB — TSH: TSH: 2.9 mIU/L (ref 0.40–4.50)

## 2021-05-26 NOTE — Progress Notes (Signed)
<><><><><><><><><><><><><><><><><><><><><><><><><><><><><><><><><> ?<><><><><><><><><><><><><><><><><><><><><><><><><><><><><><><><><> ? ?-    Bone Density shows Osteopenia  ? ?- So recommendations are  ? ?                             -   Regular Daily Exercise ? ?                             -   Calcium 500 - 600 mg  /day ? ?                             -   Continue you Vitamin D supplements  ? ?<><><><><><><><><><><><><><><><><><><><><><><><><><><><><><><><><> ?<><><><><><><><><><><><><><><><><><><><><><><><><><><><><><><><><> ? ? ? ? ? ? ? ? ?

## 2021-06-01 DIAGNOSIS — N3941 Urge incontinence: Secondary | ICD-10-CM | POA: Insufficient documentation

## 2021-06-01 DIAGNOSIS — N952 Postmenopausal atrophic vaginitis: Secondary | ICD-10-CM | POA: Diagnosis not present

## 2021-06-01 DIAGNOSIS — N993 Prolapse of vaginal vault after hysterectomy: Secondary | ICD-10-CM | POA: Diagnosis not present

## 2021-06-01 DIAGNOSIS — Z4689 Encounter for fitting and adjustment of other specified devices: Secondary | ICD-10-CM | POA: Diagnosis not present

## 2021-06-02 ENCOUNTER — Encounter: Payer: Self-pay | Admitting: Internal Medicine

## 2021-06-17 ENCOUNTER — Ambulatory Visit: Payer: Medicare PPO | Admitting: Cardiovascular Disease

## 2021-06-17 ENCOUNTER — Encounter: Payer: Self-pay | Admitting: Cardiovascular Disease

## 2021-06-17 VITALS — BP 105/76 | HR 63 | Ht 62.0 in | Wt 143.0 lb

## 2021-06-17 DIAGNOSIS — Z951 Presence of aortocoronary bypass graft: Secondary | ICD-10-CM | POA: Diagnosis not present

## 2021-06-17 DIAGNOSIS — G4733 Obstructive sleep apnea (adult) (pediatric): Secondary | ICD-10-CM | POA: Diagnosis not present

## 2021-06-17 DIAGNOSIS — I7300 Raynaud's syndrome without gangrene: Secondary | ICD-10-CM | POA: Diagnosis not present

## 2021-06-17 DIAGNOSIS — R0789 Other chest pain: Secondary | ICD-10-CM | POA: Diagnosis not present

## 2021-06-17 DIAGNOSIS — E785 Hyperlipidemia, unspecified: Secondary | ICD-10-CM | POA: Diagnosis not present

## 2021-06-17 DIAGNOSIS — I2510 Atherosclerotic heart disease of native coronary artery without angina pectoris: Secondary | ICD-10-CM

## 2021-06-17 DIAGNOSIS — I350 Nonrheumatic aortic (valve) stenosis: Secondary | ICD-10-CM | POA: Diagnosis not present

## 2021-06-17 MED ORDER — NITROGLYCERIN 0.4 MG SL SUBL: 0.4000 mg | SUBLINGUAL_TABLET | SUBLINGUAL | 12 refills | Status: DC | PRN

## 2021-06-17 NOTE — Patient Instructions (Signed)
Medication Instructions:  ?The current medical regimen is effective;  continue present plan and medications as directed. Please refer to the Current Medication list given to you today.  ? ?*If you need a refill on your cardiac medications before your next appointment, please call your pharmacy* ? ?Lab Work:   Testing/Procedures:  ?NONE    NONE ? ?Follow-Up: ?Your next appointment:  3 MONTHS AFTER USING NEW CPAP MACHINE  In Person with Nicki Guadalajara, MD    ? ?Please call our office 2 months in advance to schedule this appointment  ? ?At [facility], you and your health needs are our priority.  As part of our continuing mission to provide you with exceptional heart care, we have created designated Provider Care Teams.  These Care Teams include your primary Cardiologist (physician) and Advanced Practice Providers (APPs -  Physician Assistants and Nurse Practitioners) who all work together to provide you with the care you need, when you need it. ? ? ? ?Important Information About Sugar ? ? ? ? ? ? ? ?  ? ?

## 2021-06-17 NOTE — Progress Notes (Signed)
Patient ID: Jasmine Reeves, female   DOB: 08-03-1940, 81 y.o.   MRN: [id number] ? ? ? ? ? ? ?HPI: Jasmine Reeves is a 81 y.o. female presents for a 3 month follow cardiology evaluation. ? ?Ms. Lemar underwent CABG revascularization surgery x4 on 03/02/2000 by Dr. Cornelius Moras with a LIMA placed her distal LAD, vein to the second diagonal, sequential vein to 2 marginal vessels arising from the circumflex coronary artery. Her RCA was free of disease and was not bypassed. Additional problems include mitral valve prolapse, Raynaud's disease, history of short bursts of nonsustained tachycardia on treadmill testing with reference to this on beta blocker therapy.,. She did develop edema remotely on amlodipine. She hasa history of documented APCs on Holter imaging which have improved with labetalol. ? ?She underwent a nuclear perfusion study on 09/07/2013.  She was noted to have ST segment changes  on ECG but nuclear imaging showed apical thinning with breast attenuation, without ischemia.  She had normal wall motion. ? ?She denies any awareness of palpitations.  She does have issues with some arthritic symptoms.  She takes diltiazem 180 mg in addition to losartan 25 mg, Normodyne 150 mg twice a day and HCTZ 25 mg.  She is on Vytorin 10/20 for hyperlipidemia. ? ?She tells me she was referred for sleep study which was done in [place] after a home study was abnormal.  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 Epworth sleepiness scale score was increased at 16.  CPAP was recommended but this has not yet been initiated.  The patient was not aware that I also am involved in sleep medicine. I reviewed with her current Medicare guidelines. ? ?Lab work by her primary physician in 2016 revealed fasting glucose of 110.  She had normal renal function.  LFTs were normal.  Lipid studies were excellent with a total cholesterol 145,  triglycerides 57, HDL 62, and LDL 72.  Hemoglobin A1c was minimally increased at 5.8.  TSH was 3.029. ? ?When I  saw her in September 2018, she denied any chest pain.  She had experienced a presyncopal spell on a very hot day and her dose of Cardizem and losartan were reduced.  She denies any recurrent palpitations.   Her losartan and labetalol were reduced.  Presently, she is without chest pain.  She denies shortness of breath.  Her blood pressure has tended to be low.  She stopped taking hydrochlorothiazide and is now taking diltiazem 180 mg which is also helpful for her a ray nods phenomena and has only been taking labetalol 100 mg at bedtime and losartan 12.5 mg  in the morning.  She is on CPAP therapy for his obstructive sleep apnea which was diagnosed after a home sleep study in [place].  [employer] is her DME company.  Repeat laboratory by Dr. Oneta Rack had shown a total cholesterol 145, HDL 57, triglycerides were increased at 215.  LDL was 60. ? ?I saw her in April 2019.  At that time she was remaining stable.  She not had any symptoms secondary to her Raynaud's phenomena treated with diltiazem.  Her blood pressure was low there was a 10 mm drop in blood pressure from supine to standing and I recommended discontinuance of losartan. ? ?Over the past year and a half, she has done fairly well.  However she has had issues with degenerative scoliosis.  I last saw her on January 15, 2019 at which  time she stated that she had experienced episodes of chest comfort which seem to occur at rest and not with exertion.   She was wondering if these occurred after eating certain foods.  She has noticed some mild ankle swelling.  She has begun to notice more shortness of breath with activity more easily but admits that she has not been as active as she had been doing this Covid pandemic.  She is in need for prolapsed bladder surgery which tentatively is scheduled in on March 12, 2019. She apparently has never seen a  sleep specialist for her sleep apnea and has not been sleeping as well.  She is in need for new supplies and not been using her CPAP recently and would like to reinstitute treatment.  Her DME company is [employer].   ? ?Since it had been almost 19 years since her CABG revascularization  I recommended that she undergo a preoperative echo Doppler study and nuclear perfusion study.  The echo Doppler study January 29, 2019 showed normal EF at 60 to 65%.  There was mild aortic stenosis with a mean gradient of 10, and valve area of 1.5 cm?.  A nuclear perfusion study on February 05, 2019 was normal without scar or ischemia or ECG changes.  Recent laboratory on December 3 had shown a total cholesterol 152, HDL 64, LDL 74 and triglycerides 59.  I saw her in follow-up of the above studies in December 2020.  I reviewed her preoperative echo Doppler and Lexiscan study prior to her planned bladder surgery for cystocele which was scheduled for January 2021. ? ?I saw her in March 04, 2020.  At that time she continued to do well from a cardiac standpoint.   Her husband has developed progression of his Alzheimer's disease.  They have recently moved to [facility] independent living.  When she was caring for her husband she apparently sustained a 30% fracture at T12 and went to the emergency room in December 2021.  At that time she also was experiencing hemorrhoidal bleeding.  She subsequently saw Liane Comber, NP at Dr. Idell Pickles office on February 25, 2020.  Presently she denies any chest pain.  She is to have an MRI.  She has been wearing a back brace.  She denies any Raynaud's symptomatology.  Her blood pressure was well controlled on diltiazem long-acting 180 mg, labetalol 100 mg at bedtime.  She continues to be on Vytorin 10/20 for hyperlipidemia and LDL cholesterol was 65. ? ?I last saw her on March 23, 2021.  Since her prior evaluation she had continued to do well.  She was experiencing some vague nonexertional chest  pain symptomatology.   Oftentimes she notices some right greater than left shoulder issues.  She has noticed some mild shortness of breath.  She has had back issues with compression fracture and scoliosis.  She was using CPAP therapy and has an old ResMed air sense 10 AutoSet unit for her from 2016 which is no longer wireless.  She is meeting compliance standards with average use approximately 6 hours per night.  At 8 cm water pressure AHI is excellent at 1.3 on her most recent download from December 25, 122 through March 23, 2021.  During that evaluation, I recommended she undergo a follow-up echo Doppler study as well as nuclear perfusion study. ? ?On May 11, 2021 an echo Doppler study showed normal LV function with EF 55 to 60%.  There was mild aortic stenosis with a mean gradient of 7  and peak gradient of 13.4.  There was no significant change from her prior evaluation.  A Lexiscan Myoview study also done on May 11, 2021 was low risk and showed normal perfusion.  Post-rest EF was 71%.  ? ?Presently, she feels well.  She denies any chest pain or shortness of breath.  She and her husband are now living in independent living.  Her husband's Parkinson's disease is progressing.  She brought her CPAP machine with her today.  A download continues to show excellent compliance.  However usage is only 6 hours and 12 minutes.  At 8 cm set pressure AHI is excellent at 1.8.  She presents for evaluation. ? ? ?Past Medical History:  ?Diagnosis Date  ? COPD (chronic obstructive pulmonary disease) ([place])   ? Fibromyalgia   ? GERD (gastroesophageal reflux disease)   ? IBS (irritable bowel syndrome)   ? Scoliosis   ? ? ?Past Surgical History:  ?Procedure Laterality Date  ? ABDOMINAL HYSTERECTOMY    ? APPENDECTOMY    ? CORONARY ANGIOPLASTY  12/11  ? CORONARY ARTERY BYPASS GRAFT    ? CYSTOCELE REPAIR  03/12/2019  ? cystocele and rectocele repair, Dr. Nori Riis  ? ESOPHAGOGASTRODUODENOSCOPY  07/21/12  ? polyps, Hiatal hernia Dr.  Cristina Gong  ? TONSILLECTOMY    ? TUBAL LIGATION    ? ? ?Allergies  ?Allergen Reactions  ? Altace [Ramipril]   ?  cough  ? Azithromycin   ?  palpitation  ? Celebrex [Celecoxib]   ?  Gi upset   ? Hydrochlorothiazide   ?

## 2021-06-23 ENCOUNTER — Telehealth: Payer: Self-pay | Admitting: *Deleted

## 2021-06-23 NOTE — Telephone Encounter (Signed)
Order for replacement CPAP machine submitted to [employer] via [facility] portal. ?

## 2021-06-23 NOTE — Telephone Encounter (Signed)
-----   Message from Gaynelle Cage, [place] sent at 06/17/2021 12:44 PM EDT ----- ?Order new CPAP machine from [employer]. ? ?

## 2021-06-25 ENCOUNTER — Other Ambulatory Visit: Payer: Self-pay | Admitting: Adult Health

## 2021-06-25 DIAGNOSIS — E782 Mixed hyperlipidemia: Secondary | ICD-10-CM

## 2021-06-25 DIAGNOSIS — I10 Essential (primary) hypertension: Secondary | ICD-10-CM

## 2021-07-16 DIAGNOSIS — N952 Postmenopausal atrophic vaginitis: Secondary | ICD-10-CM | POA: Diagnosis not present

## 2021-07-16 DIAGNOSIS — N993 Prolapse of vaginal vault after hysterectomy: Secondary | ICD-10-CM | POA: Diagnosis not present

## 2021-07-16 DIAGNOSIS — N393 Stress incontinence (female) (male): Secondary | ICD-10-CM | POA: Insufficient documentation

## 2021-07-16 DIAGNOSIS — N3946 Mixed incontinence: Secondary | ICD-10-CM | POA: Diagnosis not present

## 2021-08-02 ENCOUNTER — Other Ambulatory Visit: Payer: Self-pay | Admitting: Adult Health

## 2021-08-11 ENCOUNTER — Ambulatory Visit: Payer: Medicare PPO | Admitting: Orthopaedic Surgery

## 2021-08-11 VITALS — BP 126/76 | HR 76

## 2021-08-11 DIAGNOSIS — S22080K Wedge compression fracture of T11-T12 vertebra, subsequent encounter for fracture with nonunion: Secondary | ICD-10-CM | POA: Diagnosis not present

## 2021-08-11 MED ORDER — ALENDRONATE SODIUM 70 MG PO TABS: 70.0000 mg | ORAL_TABLET | ORAL | 4 refills | Status: DC

## 2021-08-11 NOTE — Progress Notes (Signed)
Office Visit Note   Patient: Jasmine Reeves           Date of Birth: 1940/12/08           MRN: [id number] Visit Date: 08/11/2021              Requested by: Unk Pinto, [address] [place],  [place] [address] PCP: Unk Pinto, MD   Assessment & Plan: Visit Diagnoses:  1. 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  She will take calcium plus vitamin D and restart her Fosamax.  I will recheck her in 1 year.  Her next bone density test should be in March 2025.  Plan is for her to take her Fosamax for complete year when she returns in June we will have her stop it in August and then she can get her new bone density in March 2025.  Follow-Up Instructions: Return in about 1 year (around 08/12/2022).   Orders:  No orders of the defined types were placed in this encounter.  Meds ordered this encounter  Medications   alendronate (FOSAMAX) 70 MG tablet    Sig: Take 1 tablet (70 mg total) by mouth once a week. Take with a full glass of water on an empty stomach.    Dispense:  12 tablet    Refill:  4      Procedures: No procedures performed   Clinical Data: No additional findings.   Subjective: Chief Complaint  Patient presents with   Spine - Follow-up    HPI 81 year old female follows up after T12 compression fracture.  Fall was December 2021.  We started Fosamax last March she took of a few months and then was worried it might bother her reflux so she quit taking it.  She was not really having any problems with it.  States sometimes she forgot to take it.  Bone density test March showed she is on osteopenia almost osteoporotic with -2.3.  She is on calcium and vitamin D.  She uses a cane.  She has had recent increase left flank pain.  She is comfortable sitting.  No bowel or bladder symptoms no fever or chills.  Review of Systems Update  unchanged. all of systems   Objective: Vital Signs: BP 126/76   Pulse 76   Physical Exam Constitutional:      Appearance: She is well-developed.  HENT:     Head: Normocephalic.     Right Ear: External ear normal.     Left Ear: External ear normal. There is no impacted cerumen.  Eyes:     Pupils: Pupils are equal, round, and reactive to light.  Neck:     Thyroid: No thyromegaly.     Trachea: No tracheal deviation.  Cardiovascular:     Rate and Rhythm: Normal rate.  Pulmonary:     Effort: Pulmonary effort is normal.  Abdominal:     Palpations: Abdomen is soft.  Musculoskeletal:     Cervical back: No rigidity.  Skin:    General: Skin is warm and dry.  Neurological:     Mental Status: She is alert and oriented to person, place, and time.  Psychiatric:        Behavior: Behavior normal.     Ortho Exam scoliosis mild trochanteric bursal tenderness.  Reflexes anterior tib gastrocsoleus is intact.  She is  Specialty Comments:  No specialty comments available.  Imaging: No results found.   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   Compression fracture of T12 vertebra ([place]) 02/21/2020   Cystocele with rectocele 12/13/2018   BMI 23.0-23.9, adult 11/01/2017   Osteoporosis with current pathological fracture 04/12/2017   OSA on CPAP 11/27/2014   IBS (irritable bowel syndrome) 11/11/2014   Encounter for Medicare annual wellness exam 11/11/2014   Medication management 04/12/2014   Abnormal blood sugar 10/10/2013   Mixed hyperlipidemia 10/08/2013   CAD (coronary artery disease) 02/03/2013   MVP (mitral valve prolapse) 02/03/2013   Vitamin D deficiency    GERD (gastroesophageal reflux disease)    COPD (chronic obstructive pulmonary disease) ([place]) 02/11/2010   Essential hypertension 01/28/2010   RAYNAUDS SYNDROME 01/28/2010   Past Medical History:   Diagnosis Date   COPD (chronic obstructive pulmonary disease) ([facility])    Fibromyalgia    GERD (gastroesophageal reflux disease)    IBS (irritable bowel syndrome)    Scoliosis     Family History  Problem Relation Age of Onset   Heart disease Brother    Hyperlipidemia Brother     Past Surgical History:  Procedure Laterality Date   ABDOMINAL HYSTERECTOMY     APPENDECTOMY     CORONARY ANGIOPLASTY  12/11   CORONARY ARTERY BYPASS GRAFT     CYSTOCELE REPAIR  03/12/2019   cystocele and rectocele repair, Dr. Nori Riis   ESOPHAGOGASTRODUODENOSCOPY  07/21/12   polyps, Hiatal hernia Dr. Cristina Gong   TONSILLECTOMY     TUBAL LIGATION     Social History   Occupational History   Not on file  Tobacco Use   Smoking status: Former    Types: Cigarettes    Quit date: 09/03/1965    Years since quitting: 55.9   Smokeless tobacco: Never  Substance and Sexual Activity   Alcohol use: Yes    Alcohol/week: 3.0 standard drinks of alcohol    Types: 3 Standard drinks or equivalent per week    Comment: rare   Drug use: No   Sexual activity: Not on file

## 2021-08-17 ENCOUNTER — Ambulatory Visit: Payer: Medicare PPO | Admitting: Adult Health

## 2021-08-18 ENCOUNTER — Ambulatory Visit: Payer: Medicare PPO | Admitting: Adult Health

## 2021-08-25 ENCOUNTER — Ambulatory Visit: Payer: Medicare PPO | Admitting: Nurse Practitioner

## 2021-08-25 ENCOUNTER — Encounter: Payer: Self-pay | Admitting: Nurse Practitioner

## 2021-08-25 VITALS — BP 106/68 | HR 77 | Temp 97.7°F | Wt 147.0 lb

## 2021-08-25 DIAGNOSIS — K219 Gastro-esophageal reflux disease without esophagitis: Secondary | ICD-10-CM

## 2021-08-25 DIAGNOSIS — I10 Essential (primary) hypertension: Secondary | ICD-10-CM | POA: Diagnosis not present

## 2021-08-25 DIAGNOSIS — I341 Nonrheumatic mitral (valve) prolapse: Secondary | ICD-10-CM

## 2021-08-25 DIAGNOSIS — R7309 Other abnormal glucose: Secondary | ICD-10-CM | POA: Diagnosis not present

## 2021-08-25 DIAGNOSIS — I25118 Atherosclerotic heart disease of native coronary artery with other forms of angina pectoris: Secondary | ICD-10-CM

## 2021-08-25 DIAGNOSIS — E559 Vitamin D deficiency, unspecified: Secondary | ICD-10-CM

## 2021-08-25 DIAGNOSIS — N816 Rectocele: Secondary | ICD-10-CM

## 2021-08-25 DIAGNOSIS — R6889 Other general symptoms and signs: Secondary | ICD-10-CM

## 2021-08-25 DIAGNOSIS — E782 Mixed hyperlipidemia: Secondary | ICD-10-CM

## 2021-08-25 DIAGNOSIS — Z0001 Encounter for general adult medical examination with abnormal findings: Secondary | ICD-10-CM

## 2021-08-25 DIAGNOSIS — M818 Other osteoporosis without current pathological fracture: Secondary | ICD-10-CM

## 2021-08-25 DIAGNOSIS — Z0000 Encounter for general adult medical examination without abnormal findings: Secondary | ICD-10-CM

## 2021-08-25 DIAGNOSIS — G4733 Obstructive sleep apnea (adult) (pediatric): Secondary | ICD-10-CM | POA: Diagnosis not present

## 2021-08-25 DIAGNOSIS — Z9989 Dependence on other enabling machines and devices: Secondary | ICD-10-CM

## 2021-08-25 DIAGNOSIS — J449 Chronic obstructive pulmonary disease, unspecified: Secondary | ICD-10-CM | POA: Diagnosis not present

## 2021-08-25 DIAGNOSIS — I7300 Raynaud's syndrome without gangrene: Secondary | ICD-10-CM

## 2021-08-25 DIAGNOSIS — Z79899 Other long term (current) drug therapy: Secondary | ICD-10-CM

## 2021-08-25 DIAGNOSIS — K862 Cyst of pancreas: Secondary | ICD-10-CM

## 2021-08-25 DIAGNOSIS — I700 Atherosclerosis of aorta: Secondary | ICD-10-CM

## 2021-08-25 DIAGNOSIS — N8110 Cystocele, unspecified: Secondary | ICD-10-CM

## 2021-08-26 LAB — CBC WITH DIFFERENTIAL/PLATELET
Absolute Monocytes: 473 cells/uL (ref 200–950)
Basophils Absolute: 40 cells/uL (ref 0–200)
Basophils Relative: 0.7 %
Eosinophils Absolute: 120 cells/uL (ref 15–500)
Eosinophils Relative: 2.1 %
HCT: 37.9 % (ref 35.0–45.0)
Hemoglobin: 12.9 g/dL (ref 11.7–15.5)
Lymphs Abs: 1550 cells/uL (ref 850–3900)
MCH: 31.7 pg (ref 27.0–33.0)
MCHC: 34 g/dL (ref 32.0–36.0)
MCV: 93.1 fL (ref 80.0–100.0)
MPV: 10.7 fL (ref 7.5–12.5)
Monocytes Relative: 8.3 %
Neutro Abs: 3517 cells/uL (ref 1500–7800)
Neutrophils Relative %: 61.7 %
Platelets: 197 10*3/uL (ref 140–400)
RBC: 4.07 10*6/uL (ref 3.80–5.10)
RDW: 12.3 % (ref 11.0–15.0)
Total Lymphocyte: 27.2 %
WBC: 5.7 10*3/uL (ref 3.8–10.8)

## 2021-08-26 LAB — COMPLETE METABOLIC PANEL WITH GFR
AG Ratio: 2.4 (calc) (ref 1.0–2.5)
ALT: 22 U/L (ref 6–29)
AST: 21 U/L (ref 10–35)
Albumin: 4.4 g/dL (ref 3.6–5.1)
Alkaline phosphatase (APISO): 65 U/L (ref 37–153)
BUN: 18 mg/dL (ref 7–25)
CO2: 25 mmol/L (ref 20–32)
Calcium: 8.9 mg/dL (ref 8.6–10.4)
Chloride: 106 mmol/L (ref 98–110)
Creat: 0.74 mg/dL (ref 0.60–1.00)
Globulin: 1.8 g/dL (calc) — ABNORMAL LOW (ref 1.9–3.7)
Glucose, Bld: 140 mg/dL — ABNORMAL HIGH (ref 65–99)
Potassium: 4.1 mmol/L (ref 3.5–5.3)
Sodium: 141 mmol/L (ref 135–146)
Total Bilirubin: 0.4 mg/dL (ref 0.2–1.2)
Total Protein: 6.2 g/dL (ref 6.1–8.1)
eGFR: 83 mL/min/{1.73_m2} (ref >=60)

## 2021-08-26 LAB — LIPID PANEL
Cholesterol: 144 mg/dL (ref <200)
HDL: 66 mg/dL (ref >=50)
LDL Cholesterol (Calc): 53 mg/dL (calc)
Non-HDL Cholesterol (Calc): 78 mg/dL (calc) (ref <130)
Total CHOL/HDL Ratio: 2.2 (calc) (ref <5.0)
Triglycerides: 186 mg/dL — ABNORMAL HIGH (ref <150)

## 2021-08-26 LAB — MAGNESIUM: Magnesium: 2 mg/dL (ref 1.5–2.5)

## 2021-10-02 DIAGNOSIS — K862 Cyst of pancreas: Secondary | ICD-10-CM | POA: Diagnosis not present

## 2021-10-05 DIAGNOSIS — K862 Cyst of pancreas: Secondary | ICD-10-CM | POA: Diagnosis not present

## 2021-10-24 IMAGING — CT CT ABD-PELV W/ CM
2 of 5 series · 16 of 46 positions shown, 18 images · IV contrast (Omnipaque)
Comparison: 07/21/2016 CT abdomen/pelvis.

CLINICAL DATA: Left flank and back pain.  Bloody stools.

EXAM:
CT ABDOMEN AND PELVIS WITH CONTRAST
TECHNIQUE: Multidetector CT imaging of the abdomen and pelvis was performed
using the standard protocol following bolus administration of
intravenous contrast.
CONTRAST:  100mL OMNIPAQUE IOHEXOL 300 MG/ML  SOLN

[Series 503: axial st · axial · 0.87mm/px · z∈[-410,-60]mm · 13 of 78 slices shown, 15 images]
[im 4/78  soft-tissue]
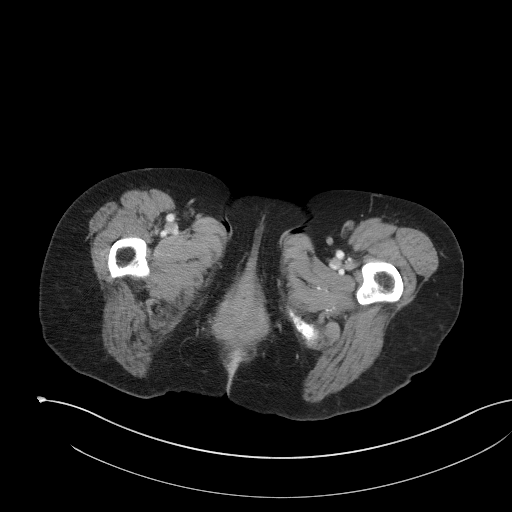
[im 4/78  bone]
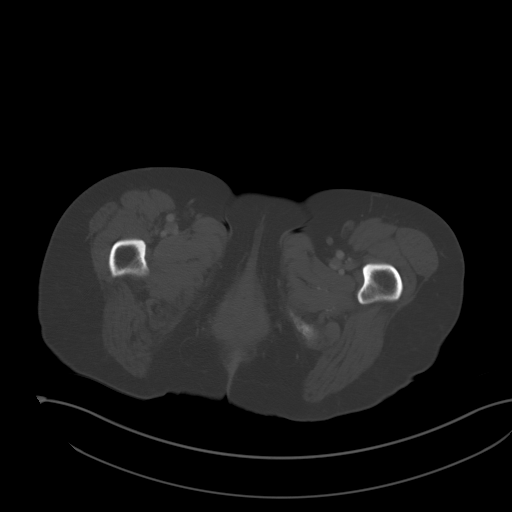
[im 12/78  soft-tissue]
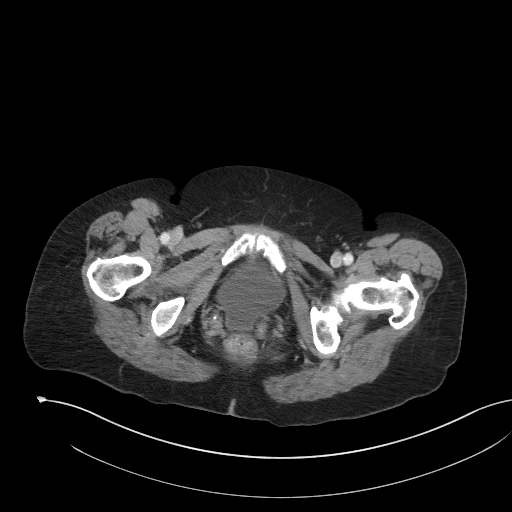
[im 16/78  soft-tissue]
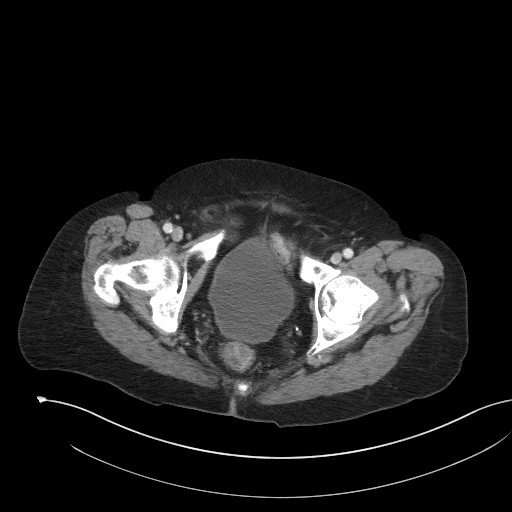
[im 24/78  soft-tissue]
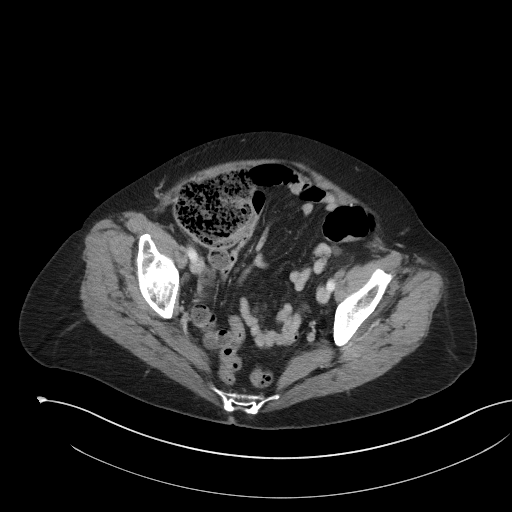
[im 27/78  soft-tissue]
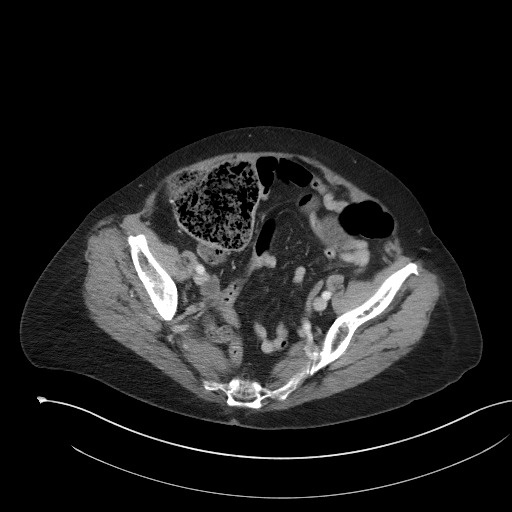
[im 35/78  soft-tissue]
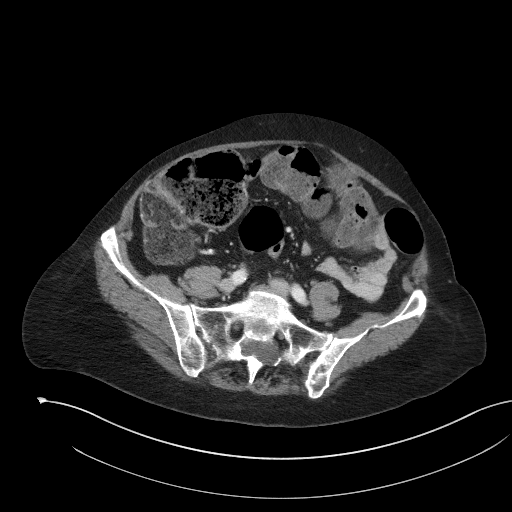
[im 39/78  soft-tissue]
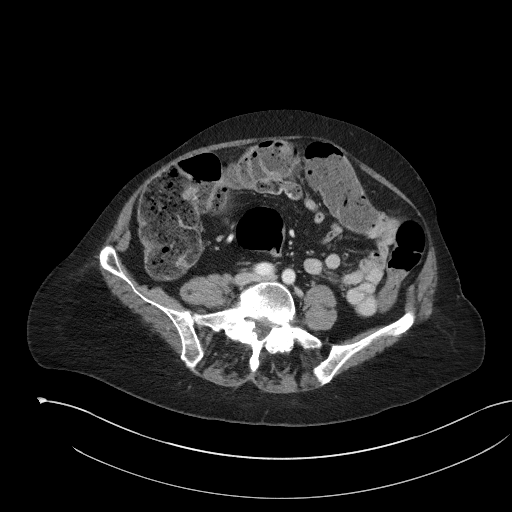
[im 43/78  soft-tissue]
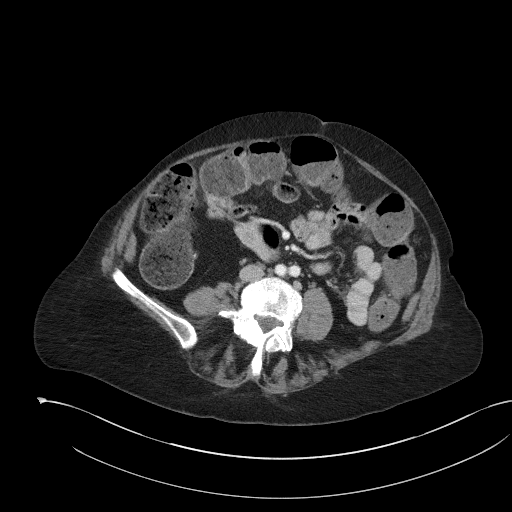
[im 51/78  soft-tissue]
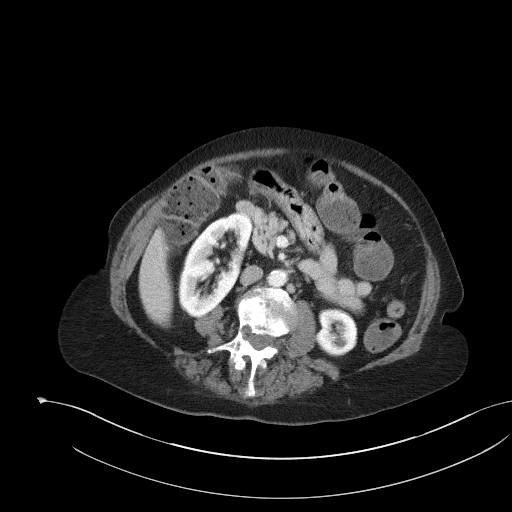
[im 51/78  bone]
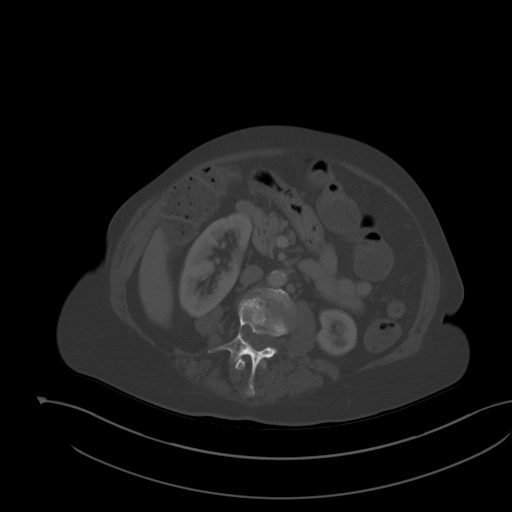
[im 54/78  soft-tissue]
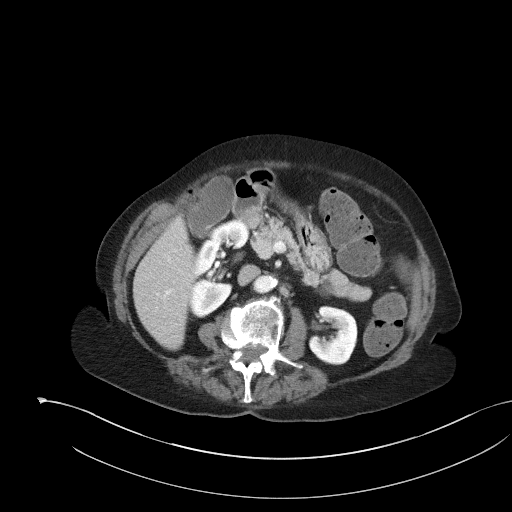
[im 62/78  soft-tissue]
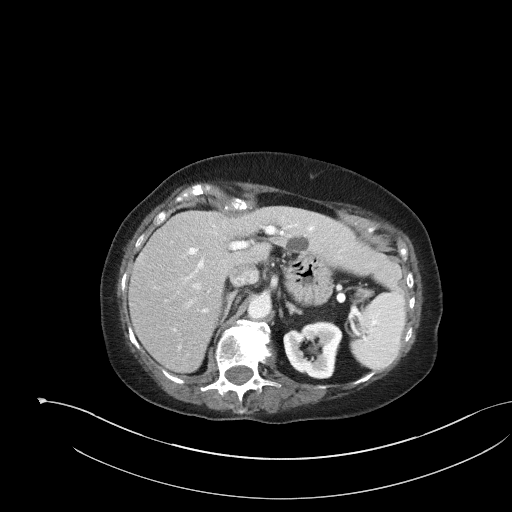
[im 66/78  soft-tissue]
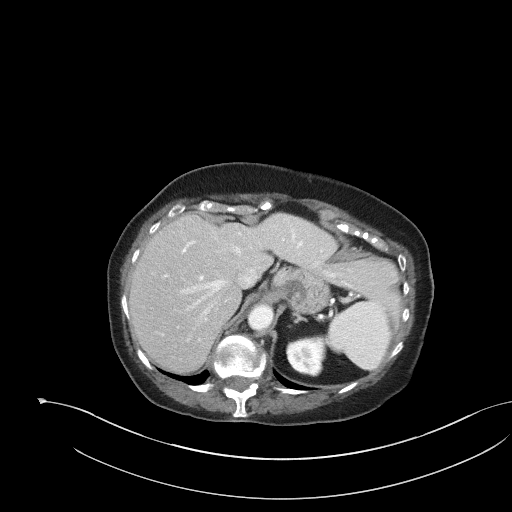
[im 74/78  soft-tissue]
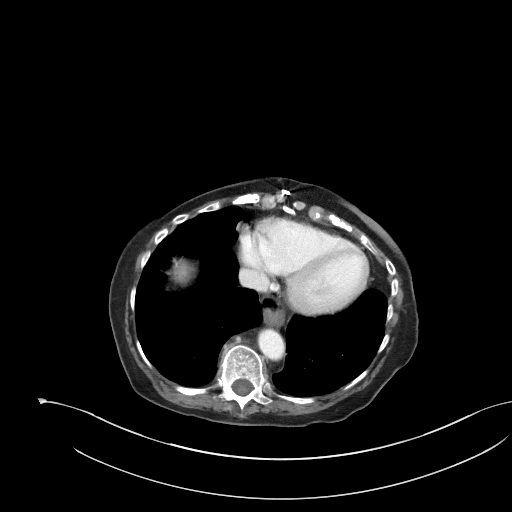

[Series 505: coronal st · coronal · 0.77mm/px · 3 of 89 slices shown]
[im 30/89  soft-tissue]
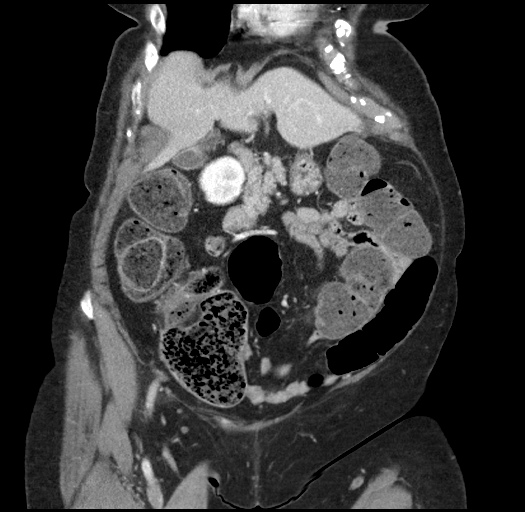
[im 40/89  soft-tissue]
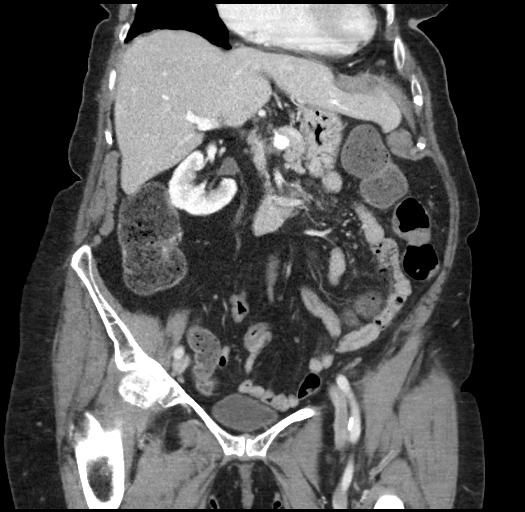
[im 49/89  soft-tissue]
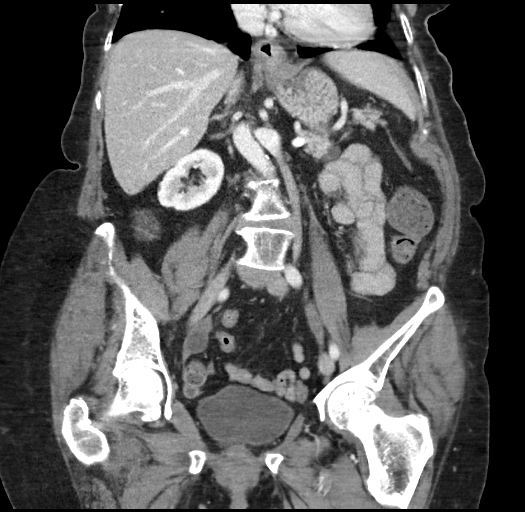

[16 of 46 positions shown; findings below may reference images not displayed]

FINDINGS: Lower chest: No significant pulmonary nodules or acute consolidative
airspace disease. Intact lower sternotomy wire.

Hepatobiliary: Normal liver size. Simple 2.0 cm posterior left liver
cyst. No additional liver lesions. Normal gallbladder with no
radiopaque cholelithiasis. No biliary ductal dilatation.

Pancreas: No pancreatic duct dilation. Two pancreatic tail cystic
lesions, largest 1.5 cm (series 503/image 24), not definitely seen
on prior noncontrast CT.

Spleen: Normal size. No mass.

Adrenals/Urinary Tract: Normal adrenals. No hydronephrosis.
Subcentimeter hypodense posterior lower left renal cortical lesion,
too small to characterize, requiring no follow-up. Small cystocele.
Otherwise normal bladder.

Stomach/Bowel: Small to moderate hiatal hernia. Otherwise normal
nondistended stomach. Normal caliber small bowel with no small bowel
wall thickening. Appendectomy. Large right colonic stool volume.
Mild sigmoid diverticulosis with no large bowel wall thickening or
significant pericolonic fat stranding.

Vascular/Lymphatic: Atherosclerotic nonaneurysmal abdominal aorta.
Patent portal, splenic, hepatic and renal veins. No pathologically
enlarged lymph nodes in the abdomen or pelvis.

Reproductive: Status post hysterectomy, with no abnormal findings at
the vaginal cuff. Simple 1.6 cm right adnexal cyst (series 503/image
53), stable, requiring no follow-up. No left adnexal mass.

Other: No pneumoperitoneum, ascites or focal fluid collection.

Musculoskeletal: No aggressive appearing focal osseous lesions.
Marked lumbar spondylosis. Mild levoscoliosis of the lumbar spine.
Mild inferior T12 vertebral compression fracture, new since
07/21/2016 CT, with approximately 20-30% loss of vertebral body
height anteriorly.
IMPRESSION: 1. Mild inferior T12 vertebral compression fracture, new since
07/21/2016 CT, possibly acute.
2. Large right colonic stool volume, suggesting constipation. No
evidence of bowel obstruction or acute bowel inflammation. Mild
sigmoid diverticulosis, with no evidence of acute diverticulitis.
3. Two pancreatic tail cystic lesions, largest 1.5 cm, not
definitely seen on prior noncontrast CT. Recommend further
characterization with MRI abdomen without and with IV contrast on a
short term outpatient basis.
4. Small to moderate hiatal hernia.
5. Small cystocele.
6. Aortic Atherosclerosis ([id number]).

## 2021-10-24 IMAGING — CT CT L SPINE W/O CM
4 of 8 series · 13 of 33 positions shown, 14 images · IV contrast (Omnipaque)
Comparison: CT abdomen and pelvis 07/21/2016.

CLINICAL DATA: Left flank and back pain. No known injury. The
patient suffered a fall 02/11/2020.

EXAM:
CT LUMBAR SPINE WITHOUT CONTRAST
TECHNIQUE: Multidetector CT imaging of the lumbar spine was performed without
intravenous contrast administration. Multiplanar CT image
reconstructions were also generated.

[Series 2: axial st · axial · 0.87mm/px · z∈[-300,-170]mm · 2 of 78 slices shown, 3 images]
[im 26/78  soft-tissue]
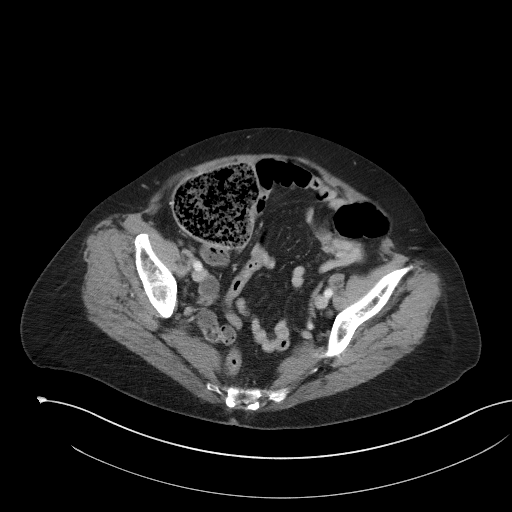
[im 26/78  bone]
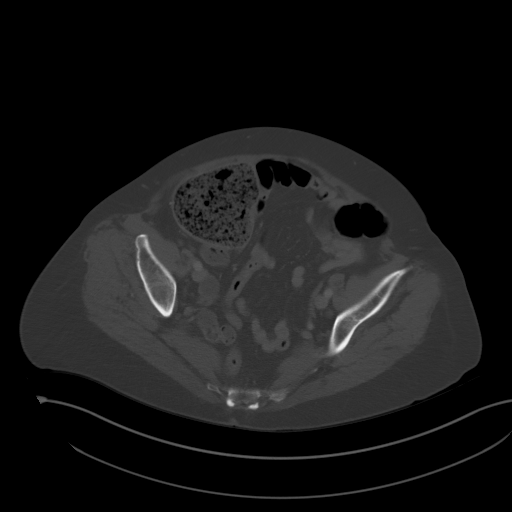
[im 52/78  bone]
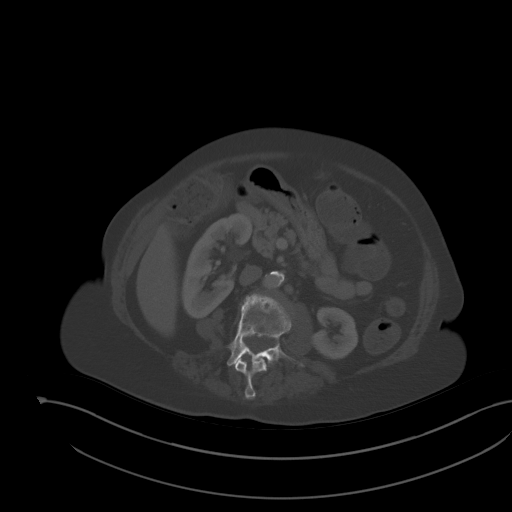

[Series 5: coronal st · coronal · 0.77mm/px · 1 of 89 slices shown]
[im 45/89  bone]
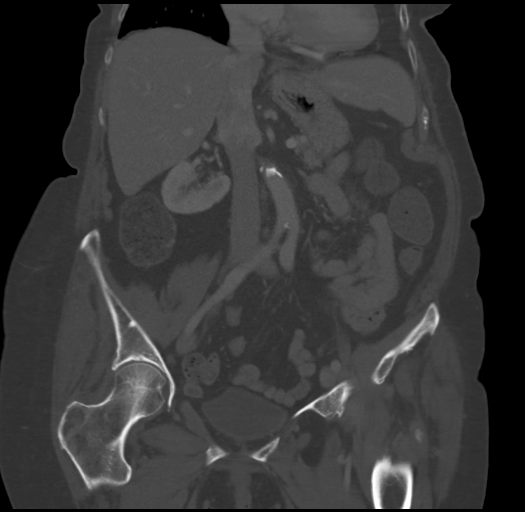

[Series 6: sagittal st · sagittal · 0.63mm/px · 5 of 118 slices shown]
[im 17/118  bone]
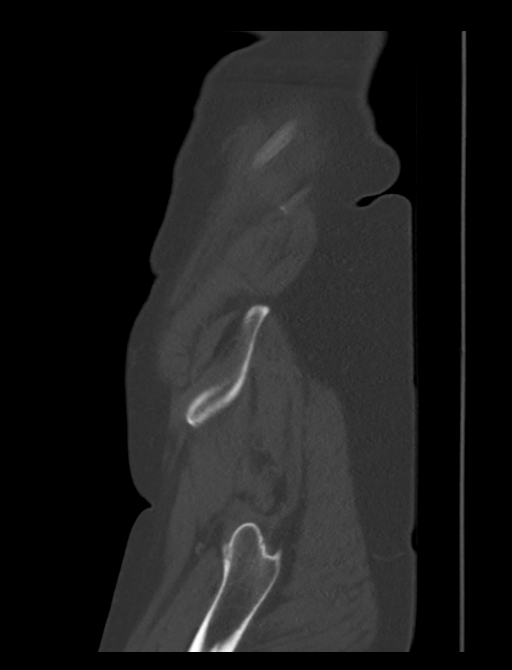
[im 34/118  bone]
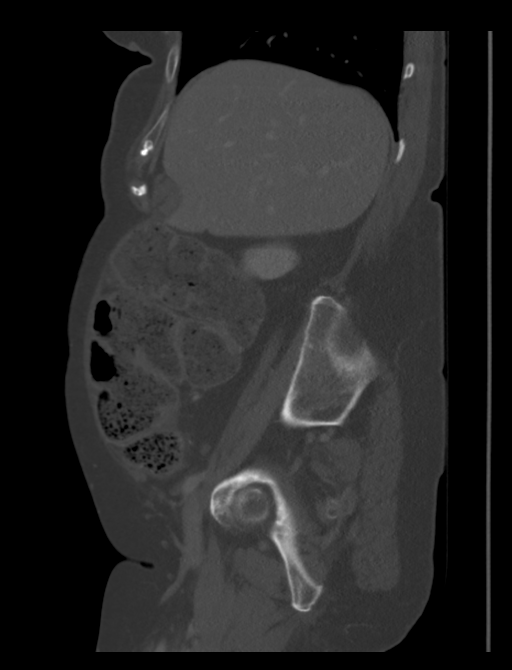
[im 51/118  bone]
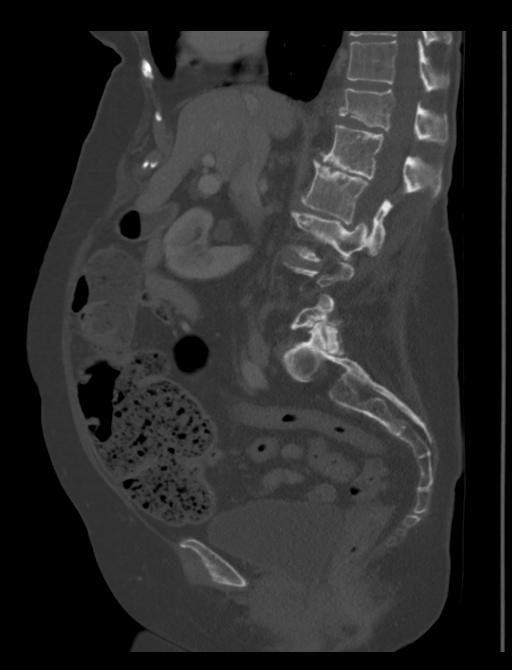
[im 67/118  bone]
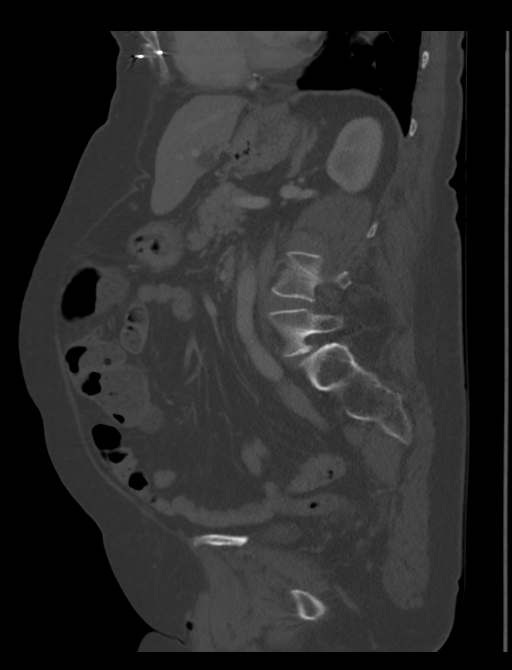
[im 84/118  bone]
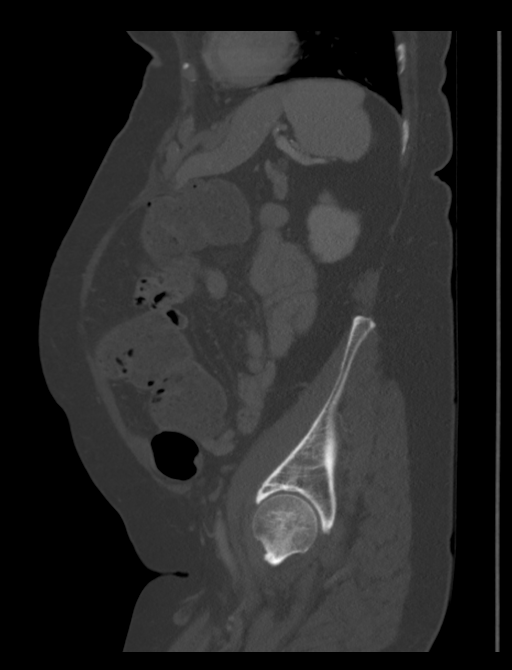

[Series 8: l spine bone · axial · 0.27mm/px · z∈[-251,-89]mm · 5 of 123 slices shown]
[im 21/123  bone]
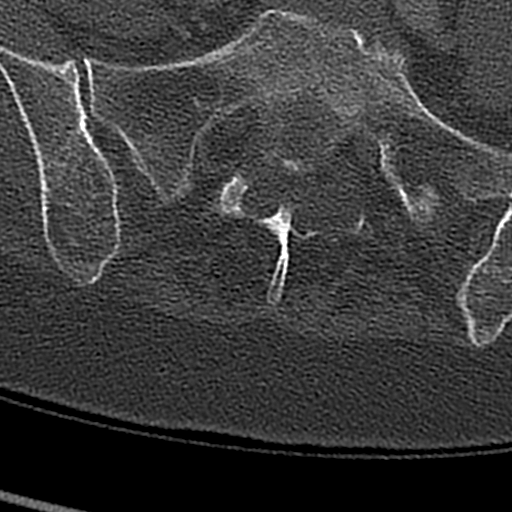
[im 41/123  bone]
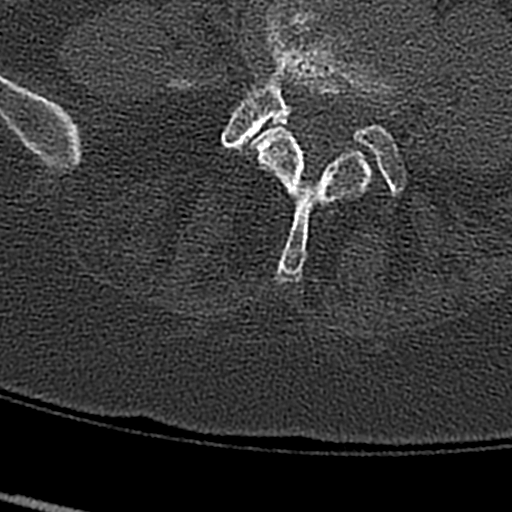
[im 62/123  bone]
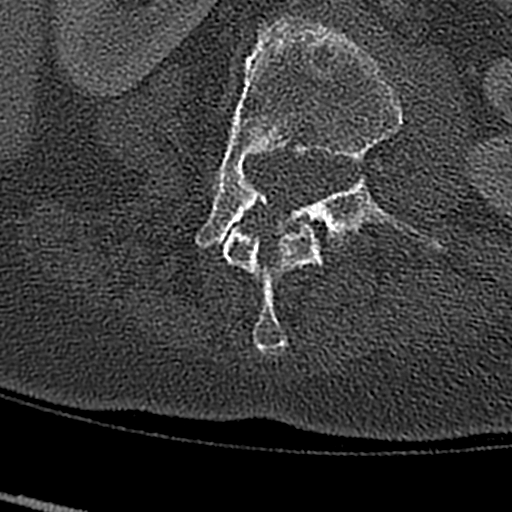
[im 82/123  bone]
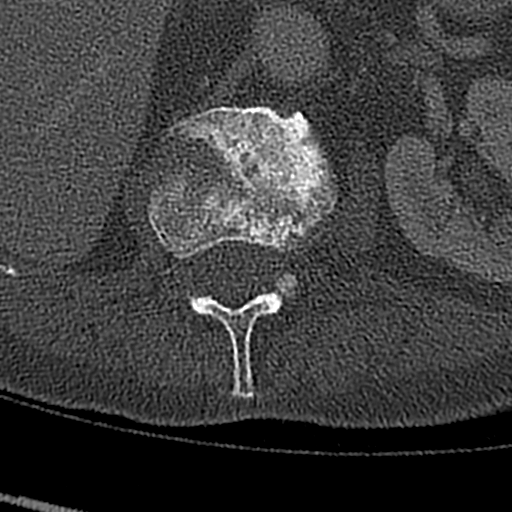
[im 102/123  bone]
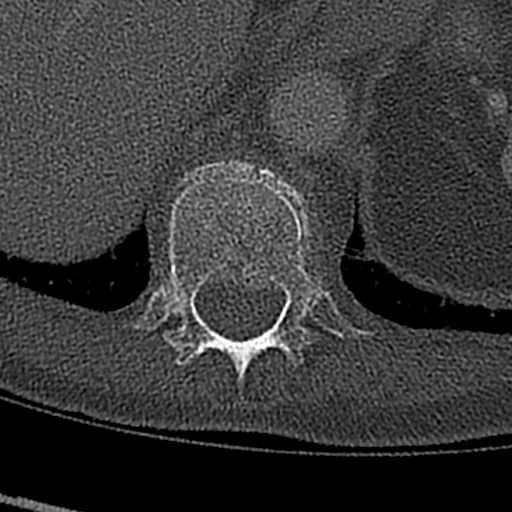

[13 of 33 positions shown; findings below may reference images not displayed]

FINDINGS: Segmentation: Standard.

Alignment: No listhesis. Convex left scoliosis with the apex at L4
noted.

Vertebrae: The patient has a mild compression fracture of T12
involving both the superior and inferior endplates. Fracture lines
are sharp consistent with acute injury. Vertebral body height loss
is estimated at 30% anteriorly. There is minimal retropulsion off
the superior endplate of T12 centrally and to the left causing mild
narrowing in the left lateral recess. Foramina appear open. No
involvement of the posterior elements is identified. No other
fracture is seen. No lytic or sclerotic lesion.

Paraspinal and other soft tissues: See report of dedicated abdomen
and pelvis CT today.

Disc levels: T12-L1: Mild left lateral recess narrowing due to bony
retropulsion off the superior endplate of T12.

T12-L1: Minimal bulge and endplate spur without stenosis. Minimal
retropulsion off the inferior endplate of T12 does not cause
stenosis.

L1-2: Loss of disc space height with endplate spurring. No stenosis.

L2-3: Loss of disc space height with a shallow bulge and endplate
spur. Vacuum disc phenomenon. No stenosis.

L3-4: Shallow disc bulge and endplate spur.  No stenosis.

L4-5: There is some facet degenerative change. Loss of disc space
height with a shallow bulge and endplate spur. No stenosis.

L5-S1: Facet arthropathy. Minimal bulge and endplate spur. The
central canal is open. Mild bilateral foraminal narrowing.
IMPRESSION: Acute compression fracture of T12 with vertebral body height loss of
up to approximately 30%. Mild retropulsion off the superior endplate
of T12 causes minimal narrowing in the left lateral recess at
T11-12. There is also minimal retropulsion off the inferior endplate
of T12 without resultant stenosis.

Multilevel degenerative disease as detailed above.

Convex left scoliosis.

## 2021-11-10 DIAGNOSIS — N3941 Urge incontinence: Secondary | ICD-10-CM | POA: Diagnosis not present

## 2021-11-10 DIAGNOSIS — E785 Hyperlipidemia, unspecified: Secondary | ICD-10-CM | POA: Diagnosis not present

## 2021-11-10 DIAGNOSIS — I2510 Atherosclerotic heart disease of native coronary artery without angina pectoris: Secondary | ICD-10-CM | POA: Diagnosis not present

## 2021-11-10 DIAGNOSIS — M8580 Other specified disorders of bone density and structure, unspecified site: Secondary | ICD-10-CM | POA: Diagnosis not present

## 2021-11-10 DIAGNOSIS — N182 Chronic kidney disease, stage 2 (mild): Secondary | ICD-10-CM | POA: Diagnosis not present

## 2021-11-10 DIAGNOSIS — M810 Age-related osteoporosis without current pathological fracture: Secondary | ICD-10-CM | POA: Diagnosis not present

## 2021-11-10 DIAGNOSIS — I129 Hypertensive chronic kidney disease with stage 1 through stage 4 chronic kidney disease, or unspecified chronic kidney disease: Secondary | ICD-10-CM | POA: Diagnosis not present

## 2021-11-10 DIAGNOSIS — K219 Gastro-esophageal reflux disease without esophagitis: Secondary | ICD-10-CM | POA: Diagnosis not present

## 2021-11-10 DIAGNOSIS — M1990 Unspecified osteoarthritis, unspecified site: Secondary | ICD-10-CM | POA: Diagnosis not present

## 2021-11-17 ENCOUNTER — Encounter: Payer: Medicare PPO | Admitting: Internal Medicine

## 2021-12-02 NOTE — Patient Instructions (Signed)
Due to recent changes in healthcare laws, you Dudash see the results of your imaging and laboratory studies on MyChart before your provider has had a chance to review them.  We understand that in some cases there Gin be results that are confusing or concerning to you. Not all laboratory results come back in the same time frame and the provider Hoback be waiting for multiple results in order to interpret others.  Please give [place] 48 hours in order for your provider to thoroughly review all the results before contacting the office for clarification of your results.   +++++++++++++++++++++++++  Vit D  & Vit C 1,000 mg   are recommended to help protect  against the Covid-19 and other Corona viruses.    Also it's recommended  to take  Zinc 50 mg  to help  protect against the Covid-19   and best place to get  is also on [email or web address]  and don't pay more than 6-8 cents /pill !  ================================ Coronavirus (COVID-19) Are you at risk?  Are you at risk for the Coronavirus (COVID-19)?  To be considered HIGH RISK for Coronavirus (COVID-19), you have to meet the following criteria:  Traveled to [place], [place], [place], [place] or [place]; or in the [place] to [place], [place], [place]  or [place]; and have fever, cough, and shortness of breath within the last 2 weeks of travel OR Been in close contact with a person diagnosed with COVID-19 within the last 2 weeks and have  fever, cough,and shortness of breath  IF YOU DO NOT MEET THESE CRITERIA, YOU ARE CONSIDERED LOW RISK FOR COVID-19.  What to do if you are HIGH RISK for COVID-19?  If you are having a medical emergency, call 911. Seek medical care right away. Before you go to a doctor's office, urgent care or emergency department,  call ahead and tell them about your recent travel, contact with someone diagnosed with COVID-19   and your symptoms.  You should receive instructions from your physician's office regarding next  steps of care.  When you arrive at healthcare provider, tell the healthcare staff immediately you have returned from  visiting [place], [place], [place], [place] or [place]; or traveled in the [place] to [place], [place],  [place] or [place] in the last two weeks or you have been in close contact with a person diagnosed with  COVID-19 in the last 2 weeks.   Tell the health care staff about your symptoms: fever, cough and shortness of breath. After you have been seen by a medical provider, you will be either: Tested for (COVID-19) and discharged home on quarantine except to seek medical care if  symptoms worsen, and asked to  Stay home and avoid contact with others until you get your results (4-5 days)  Avoid travel on public transportation if possible (such as bus, train, or airplane) or Sent to the Emergency Department by EMS for evaluation, COVID-19 testing  and  possible admission depending on your condition and test results.  What to do if you are LOW RISK for COVID-19?  Reduce your risk of any infection by using the same precautions used for avoiding the common cold or flu:  Wash your hands often with soap and warm water for at least 20 seconds.  If soap and water are not readily available,  use an alcohol-based hand sanitizer with at least 60% alcohol.  If coughing or sneezing, cover your mouth and nose by coughing  or sneezing into the elbow areas of your shirt or coat,  into a tissue or into your sleeve (not your hands). Avoid shaking hands with others and consider head nods or verbal greetings only. Avoid touching your eyes, nose, or mouth with unwashed hands.  Avoid close contact with people who are sick. Avoid places or events with large numbers of people in one location, like concerts or sporting events. Carefully consider travel plans you have or are making. If you are planning any travel outside or inside the [place], visit the CDC's Travelers' Health webpage for the  latest health notices. If you have some symptoms but not all symptoms, continue to monitor at home and seek medical attention  if your symptoms worsen. If you are having a medical emergency, call 911.   >>>>>>>>>>>>>>>>>>>>>>>>>>>>>>>>>  We Do NOT Approve of [employer] > > > > > > > > > > > > > > > > > > > > > > > > > > > > > > > > > > > > > > >  Preventive Care for Adults  A healthy lifestyle and preventive care can promote health and wellness. Preventive health guidelines for women include the following key practices. A routine yearly physical is a good way to check with your health care provider about your health and preventive screening. It is a chance to share any concerns and updates on your health and to receive a thorough exam. Visit your dentist for a routine exam and preventive care every 6 months. Brush your teeth twice a day and floss once a day. Good oral hygiene prevents tooth decay and gum disease. The frequency of eye exams is based on your age, health, family medical history, use of contact lenses, and other factors. Follow your health care provider's recommendations for frequency of eye exams. Eat a healthy diet. Foods like vegetables, fruits, whole grains, low-fat dairy products, and lean protein foods contain the nutrients you need without too many calories. Decrease your intake of foods high in solid fats, added sugars, and salt. Eat the right amount of calories for you. Get information about a proper diet from your health care provider, if necessary. Regular physical exercise is one of the most important things you can do for your health. Most adults should get at least 150 minutes of moderate-intensity exercise (any activity that increases your heart rate and causes you to sweat) each week. In addition, most adults need muscle-strengthening exercises on 2 or more days a week. Maintain a healthy weight. The body mass index (BMI) is a screening tool to identify  possible weight problems. It provides an estimate of body fat based on height and weight. Your health care provider can find your BMI and can help you achieve or maintain a healthy weight. For adults 20 years and older: A BMI below 18.5 is considered underweight. A BMI of 18.5 to 24.9 is normal. A BMI of 25 to 29.9 is considered overweight. A BMI of 30 and above is considered obese. Maintain normal blood lipids and cholesterol levels by exercising and minimizing your intake of saturated fat. Eat a balanced diet with plenty of fruit and vegetables. If your lipid or cholesterol levels are high, you are over 50, or you are at high risk for heart disease, you may need your cholesterol levels checked more frequently. Ongoing high lipid and cholesterol levels should be treated with medicines if diet and exercise are not working. If you smoke, find out from  your health care provider how to quit. If you do not use tobacco, do not start. Lung cancer screening is recommended for adults aged 53-80 years who are at high risk for developing lung cancer because of a history of smoking. A yearly low-dose CT scan of the lungs is recommended for people who have at least a 30-pack-year history of smoking and are a current smoker or have quit within the past 15 years. A pack year of smoking is smoking an average of 1 pack of cigarettes a day for 1 year (for example: 1 pack a day for 30 years or 2 packs a day for 15 years). Yearly screening should continue until the smoker has stopped smoking for at least 15 years. Yearly screening should be stopped for people who develop a health problem that would prevent them from having lung cancer treatment. Avoid use of street drugs. Do not share needles with anyone. Ask for help if you need support or instructions about stopping the use of drugs. High blood pressure causes heart disease and increases the risk of stroke.  Ongoing high blood pressure should be treated with medicines if  weight loss and exercise do not work. If you are 70-9 years old, ask your health care provider if you should take aspirin to prevent strokes. Diabetes screening involves taking a blood sample to check your fasting blood sugar level. This should be done once every 3 years, after age 55, if you are within normal weight and without risk factors for diabetes. Testing should be considered at a younger age or be carried out more frequently if you are overweight and have at least 1 risk factor for diabetes. Breast cancer screening is essential preventive care for women. You should practice "breast self-awareness." This means understanding the normal appearance and feel of your breasts and may include breast self-examination. Any changes detected, no matter how small, should be reported to a health care provider. Women in their 74s and 30s should have a clinical breast exam (CBE) by a health care provider as part of a regular health exam every 1 to 3 years. After age 74, women should have a CBE every year. Starting at age 75, women should consider having a mammogram (breast X-ray test) every year. Women who have a family history of breast cancer should talk to their health care provider about genetic screening. Women at a high risk of breast cancer should talk to their health care providers about having an MRI and a mammogram every year. Breast cancer gene (BRCA)-related cancer risk assessment is recommended for women who have family members with BRCA-related cancers. BRCA-related cancers include breast, ovarian, tubal, and peritoneal cancers. Having family members with these cancers may be associated with an increased risk for harmful changes (mutations) in the breast cancer genes BRCA1 and BRCA2. Results of the assessment will determine the need for genetic counseling and BRCA1 and BRCA2 testing. Routine pelvic exams to screen for cancer are no longer recommended for nonpregnant women who are considered low risk for  cancer of the pelvic organs (ovaries, uterus, and vagina) and who do not have symptoms. Ask your health care provider if a screening pelvic exam is right for you. If you have had past treatment for cervical cancer or a condition that could lead to cancer, you need Pap tests and screening for cancer for at least 20 years after your treatment. If Pap tests have been discontinued, your risk factors (such as having a new sexual partner) need to be  reassessed to determine if screening should be resumed. Some women have medical problems that increase the chance of getting cervical cancer. In these cases, your health care provider may recommend more frequent screening and Pap tests.  Colorectal cancer can be detected and often prevented. Most routine colorectal cancer screening begins at the age of 3 years and continues through age 52 years. However, your health care provider may recommend screening at an earlier age if you have risk factors for colon cancer. On a yearly basis, your health care provider may provide home test kits to check for hidden blood in the stool. Use of a small camera at the end of a tube, to directly examine the colon (sigmoidoscopy or colonoscopy), can detect the earliest forms of colorectal cancer. Talk to your health care provider about this at age 69, when routine screening begins.  Direct exam of the colon should be repeated every 5-10 years through age 72 years, unless early forms of pre-cancerous polyps or small growths are found. Osteoporosis is a disease in which the bones lose minerals and strength with aging. This can result in serious bone fractures or breaks. The risk of osteoporosis can be identified using a bone density scan. Women ages 2 years and over and women at risk for fractures or osteoporosis should discuss screening with their health care providers. Ask your health care provider whether you should take a calcium supplement or vitamin D to reduce the rate of  osteoporosis. Menopause can be associated with physical symptoms and risks. Hormone replacement therapy is available to decrease symptoms and risks. You should talk to your health care provider about whether hormone replacement therapy is right for you. Use sunscreen. Apply sunscreen liberally and repeatedly throughout the day. You should seek shade when your shadow is shorter than you. Protect yourself by wearing long sleeves, pants, a wide-brimmed hat, and sunglasses year round, whenever you are outdoors. Once a month, do a whole body skin exam, using a mirror to look at the skin on your back. Tell your health care provider of new moles, moles that have irregular borders, moles that are larger than a pencil eraser, or moles that have changed in shape or color. Stay current with required vaccines (immunizations). Influenza vaccine. All adults should be immunized every year. Tetanus, diphtheria, and acellular pertussis (Td, Tdap) vaccine. Pregnant women should receive 1 dose of Tdap vaccine during each pregnancy. The dose should be obtained regardless of the length of time since the last dose. Immunization is preferred during the 27th-36th week of gestation. An adult who has not previously received Tdap or who does not know her vaccine status should receive 1 dose of Tdap. This initial dose should be followed by tetanus and diphtheria toxoids (Td) booster doses every 10 years. Adults with an unknown or incomplete history of completing a 3-dose immunization series with Td-containing vaccines should begin or complete a primary immunization series including a Tdap dose. Adults should receive a Td booster every 10 years.  Zoster vaccine. One dose is recommended for adults aged 27 years or older unless certain conditions are present.  Pneumococcal 13-valent conjugate (PCV13) vaccine. When indicated, a person who is uncertain of her immunization history and has no record of immunization should receive the PCV13  vaccine. An adult aged 2 years or older who has certain medical conditions and has not been previously immunized should receive 1 dose of PCV13 vaccine. This PCV13 should be followed with a dose of pneumococcal polysaccharide (PPSV23) vaccine. The PPSV23  vaccine dose should be obtained at least 1 or more year(s) after the dose of PCV13 vaccine. An adult aged 19 years or older who has certain medical conditions and previously received 1 or more doses of PPSV23 vaccine should receive 1 dose of PCV13. The PCV13 vaccine dose should be obtained 1 or more years after the last PPSV23 vaccine dose.  Pneumococcal polysaccharide (PPSV23) vaccine. When PCV13 is also indicated, PCV13 should be obtained first. All adults aged 65 years and older should be immunized. An adult younger than age 65 years who has certain medical conditions should be immunized. Any person who resides in a nursing home or long-term care facility should be immunized. An adult smoker should be immunized. People with an immunocompromised condition and certain other conditions should receive both PCV13 and PPSV23 vaccines. People with human immunodeficiency virus (HIV) infection should be immunized as soon as possible after diagnosis. Immunization during chemotherapy or radiation therapy should be avoided. Routine use of PPSV23 vaccine is not recommended for American Indians, [place] Natives, or people younger than 65 years unless there are medical conditions that require PPSV23 vaccine. When indicated, people who have unknown immunization and have no record of immunization should receive PPSV23 vaccine. One-time revaccination 5 years after the first dose of PPSV23 is recommended for people aged 19-64 years who have chronic kidney failure, nephrotic syndrome, asplenia, or immunocompromised conditions. People who received 1-2 doses of PPSV23 before age 65 years should receive another dose of PPSV23 vaccine at age 65 years or later if at least 5 years have  passed since the previous dose. Doses of PPSV23 are not needed for people immunized with PPSV23 at or after age 65 years.  Preventive Services / Frequency  Ages 65 years and over Blood pressure check. Lipid and cholesterol check. Lung cancer screening. / Every year if you are aged 55-80 years and have a 30-pack-year history of smoking and currently smoke or have quit within the past 15 years. Yearly screening is stopped once you have quit smoking for at least 15 years or develop a health problem that would prevent you from having lung cancer treatment. Clinical breast exam.** / Every year after age 40 years.  BRCA-related cancer risk assessment.** / For women who have family members with a BRCA-related cancer (breast, ovarian, tubal, or peritoneal cancers). Mammogram.** / Every year beginning at age 40 years and continuing for as long as you are in good health. Consult with your health care provider. Pap test.** / Every 3 years starting at age 30 years through age 65 or 70 years with 3 consecutive normal Pap tests. Testing can be stopped between 65 and 70 years with 3 consecutive normal Pap tests and no abnormal Pap or HPV tests in the past 10 years. Fecal occult blood test (FOBT) of stool. / Every year beginning at age 50 years and continuing until age 75 years. You may not need to do this test if you get a colonoscopy every 10 years. Flexible sigmoidoscopy or colonoscopy.** / Every 5 years for a flexible sigmoidoscopy or every 10 years for a colonoscopy beginning at age 50 years and continuing until age 75 years. Hepatitis C blood test.** / For all people born from 1945 through 1965 and any individual with known risks for hepatitis C. Osteoporosis screening.** / A one-time screening for women ages 65 years and over and women at risk for fractures or osteoporosis. Skin self-exam. / Monthly. Influenza vaccine. / Every year. Tetanus, diphtheria, and acellular pertussis (Tdap/Td) vaccine.** /   1 dose  of Td every 10 years. Zoster vaccine.** / 1 dose for adults aged 98 years or older. Pneumococcal 13-valent conjugate (PCV13) vaccine.** / Consult your health care provider. Pneumococcal polysaccharide (PPSV23) vaccine.** / 1 dose for all adults aged 33 years and older. Screening for abdominal aortic aneurysm (AAA)  by ultrasound is recommended for people who have history of high blood pressure or who are current or former smokers. ++++++++++++++++++++ Recommend Adult Low Dose Aspirin or  coated  Aspirin 81 mg daily  To reduce risk of Colon Cancer 40 %,  Skin Cancer 26 % ,  Melanoma 46%  and  Pancreatic cancer 60% ++++++++++++++++++++ Vitamin D goal  is between 70-100.  Please make sure that you are taking your Vitamin D as directed.  It is very important as a natural anti-inflammatory  helping hair, skin, and nails, as well as reducing stroke and heart attack risk.  It helps your bones and helps with mood. It also decreases numerous cancer risks so please take it as directed.  Low Vit D is associated with a 200-300% higher risk for CANCER  and 200-300% higher risk for HEART   ATTACK  &  STROKE.   .....................................Marland Kitchen It is also associated with higher death rate at younger ages,  autoimmune diseases like Rheumatoid arthritis, Lupus, Multiple Sclerosis.    Also many other serious conditions, like depression, Alzheimer's Dementia, infertility, muscle aches, fatigue, fibromyalgia - just to name a few. ++++++++++++++++++ Recommend the book "The END of DIETING" by Dr Excell Seltzer  & the book "The END of DIABETES " by Dr Excell Seltzer At [email or web address] - get book & Audio CD's    Being diabetic has a  300% increased risk for heart attack, stroke, cancer, and alzheimer- type vascular dementia. It is very important that you work harder with diet by avoiding all foods that are white. Avoid white rice (brown & wild rice is OK), white potatoes (sweetpotatoes in moderation is OK),  White bread or wheat bread or anything made out of white flour like bagels, donuts, rolls, buns, biscuits, cakes, pastries, cookies, pizza crust, and pasta (made from white flour & egg whites) - vegetarian pasta or spinach or wheat pasta is OK. Multigrain breads like Arnold's or [employer], or multigrain sandwich thins or flatbreads.  Diet, exercise and weight loss can reverse and cure diabetes in the early stages.  Diet, exercise and weight loss is very important in the control and prevention of complications of diabetes which affects every system in your body, ie. Brain - dementia/stroke, eyes - glaucoma/blindness, heart - heart attack/heart failure, kidneys - dialysis, stomach - gastric paralysis, intestines - malabsorption, nerves - severe painful neuritis, circulation - gangrene & loss of a leg(s), and finally cancer and Alzheimers.    I recommend avoid fried & greasy foods,  sweets/candy, white rice (brown or wild rice or Quinoa is OK), white potatoes (sweet potatoes are OK) - anything made from white flour - bagels, doughnuts, rolls, buns, biscuits,white and wheat breads, pizza crust and traditional pasta made of white flour & egg white(vegetarian pasta or spinach or wheat pasta is OK).  Multi-grain bread is OK - like multi-grain flat bread or sandwich thins. Avoid alcohol in excess. Exercise is also important.    Eat all the vegetables you want - avoid meat, especially red meat and dairy - especially cheese.  Cheese is the most concentrated form of trans-fats which is the worst thing to clog up our arteries. Veggie cheese is OK  which can be found in the fresh produce section at [facility] or [employer] or [employer]  +++++++++++++++++++ DASH Eating Plan  DASH stands for "Dietary Approaches to Stop Hypertension."   The DASH eating plan is a healthy eating plan that has been shown to reduce high blood pressure (hypertension). Additional health benefits may include reducing the risk of type 2  diabetes mellitus, heart disease, and stroke. The DASH eating plan may also help with weight loss. WHAT DO I NEED TO KNOW ABOUT THE DASH EATING PLAN? For the DASH eating plan, you will follow these general guidelines: Choose foods with a percent daily value for sodium of less than 5% (as listed on the food label). Use salt-free seasonings or herbs instead of table salt or sea salt. Check with your health care provider or pharmacist before using salt substitutes. Eat lower-sodium products, often labeled as "lower sodium" or "no salt added." Eat fresh foods. Eat more vegetables, fruits, and low-fat dairy products. Choose whole grains. Look for the word "whole" as the first word in the ingredient list. Choose fish  Limit sweets, desserts, sugars, and sugary drinks. Choose heart-healthy fats. Eat veggie cheese  Eat more home-cooked food and less restaurant, buffet, and fast food. Limit fried foods. Cook foods using methods other than frying. Limit canned vegetables. If you do use them, rinse them well to decrease the sodium. When eating at a restaurant, ask that your food be prepared with less salt, or no salt if possible.                      WHAT FOODS CAN I EAT? Read Dr Fara Olden Fuhrman's books on The End of Dieting & The End of Diabetes  Grains Whole grain or whole wheat bread. Brown rice. Whole grain or whole wheat pasta. Quinoa, bulgur, and whole grain cereals. Low-sodium cereals. Corn or whole wheat flour tortillas. Whole grain cornbread. Whole grain crackers. Low-sodium crackers.  Vegetables Fresh or frozen vegetables (raw, steamed, roasted, or grilled). Low-sodium or reduced-sodium tomato and vegetable juices. Low-sodium or reduced-sodium tomato sauce and paste. Low-sodium or reduced-sodium canned vegetables.   Fruits All fresh, canned (in natural juice), or frozen fruits.  Protein Products  All fish and seafood.  Dried beans, peas, or lentils. Unsalted nuts and seeds. Unsalted  canned beans.  Dairy Low-fat dairy products, such as skim or 1% milk, 2% or reduced-fat cheeses, low-fat ricotta or cottage cheese, or plain low-fat yogurt. Low-sodium or reduced-sodium cheeses.  Fats and Oils Tub margarines without trans fats. Light or reduced-fat mayonnaise and salad dressings (reduced sodium). Avocado. Safflower, olive, or canola oils. Natural peanut or almond butter.  Other Unsalted popcorn and pretzels. The items listed above may not be a complete list of recommended foods or beverages. Contact your dietitian for more options.  +++++++++++++++  WHAT FOODS ARE NOT RECOMMENDED? Grains/ White flour or wheat flour White bread. White pasta. White rice. Refined cornbread. Bagels and croissants. Crackers that contain trans fat.  Vegetables  Creamed or fried vegetables. Vegetables in a . Regular canned vegetables. Regular canned tomato sauce and paste. Regular tomato and vegetable juices.  Fruits Dried fruits. Canned fruit in light or heavy syrup. Fruit juice.  Meat and Other Protein Products Meat in general - RED meat & White meat.  Fatty cuts of meat. Ribs, chicken wings, all processed meats as bacon, sausage, bologna, salami, fatback, hot dogs, bratwurst and packaged luncheon meats.  Dairy Whole or 2% milk, cream, half-and-half, and cream cheese.  Whole-fat or sweetened yogurt. Full-fat cheeses or blue cheese. Non-dairy creamers and whipped toppings. Processed cheese, cheese spreads, or cheese curds.  Condiments Onion and garlic salt, seasoned salt, table salt, and sea salt. Canned and packaged gravies. Worcestershire sauce. Tartar sauce. Barbecue sauce. Teriyaki sauce. Soy sauce, including reduced sodium. Steak sauce. Fish sauce. Oyster sauce. Cocktail sauce. Horseradish. Ketchup and mustard. Meat flavorings and tenderizers. Bouillon cubes. Hot sauce. Tabasco sauce. Marinades. Taco seasonings. Relishes.  Fats and Oils Butter, stick margarine, lard, shortening and  bacon fat. Coconut, palm kernel, or palm oils. Regular salad dressings.  Pickles and olives. Salted popcorn and pretzels.  The items listed above may not be a complete list of foods and beverages to avoid.

## 2021-12-02 NOTE — Progress Notes (Addendum)
Annual Screening/Preventative Visit & Comprehensive Evaluation &  Examination  Future Appointments  Date Time Provider Department  12/03/2021                  cpe  2:00 PM Unk Pinto, MD [facility]  08/13/2022  2:30 PM Marybelle Killings, MD [facility]  08/26/2022                 wellness  2:00 PM Alycia Rossetti, NP [facility]  12/08/2022                  cpe  2:00 PM Unk Pinto, MD [facility]        This very nice 81 y.o. MWF presents for a Screening /Preventative Visit & comprehensive evaluation and management of multiple medical co-morbidities.  Patient has been followed for HTN, HLD, Prediabetes  and Vitamin D Deficiency. Patient has hx/o COPD by CXR with very remote 24 yr smoking hx quitting in 1967. Patient is on CPAP for OSA with improved restorative sleep.  Her GERD is controlled on her meds.        HTN predates  circa 1988.   In Dec 2001, patient presented with ACS & had PCA/Stents x 2 and then in Jan 2002 , she underwent CABG.  Stress Myoview in 2015 was negative.  Patient's BP has been controlled  and patient denies any cardiac symptoms as chest pain, palpitations, shortness of breath, dizziness or ankle swelling. Today's BP is at goal - 120/68 .       Patient's hyperlipidemia is controlled with diet and Vytorin. Patient denies myalgias or other medication SE's. Last lipids were at goal :  Lab Results  Component Value Date   CHOL 144 08/25/2021   HDL 66 08/25/2021   LDLCALC 53 08/25/2021   TRIG 186 (H) 08/25/2021   CHOLHDL 2.2 08/25/2021         Patient has hx/o prediabetes (A1c 6.3% /2012 and 5.8% /2019 ) and patient denies reactive hypoglycemic symptoms, visual blurring, diabetic polys or paresthesias. Last A1c was near goal :  Lab Results  Component Value Date   HGBA1C 5.8 (H) 05/25/2021         Finally, patient has history of Vitamin D Deficiency and last Vitamin D was at goal :  Lab Results  Component Value Date   VD25OH 80 11/17/2020     \    Current Outpatient Medications  Medication Instructions   Alendronate 0 mg Weekly   VITAMIN C 500 mg Daily   Aspirin 81 mg  Daily   CALCIUM 500 mg Daily   diltiazem (DILACOR XR) 180 MG  TAKE 1 CAPSULE  DAILY   docusate sodium (COLACE) 100 mg, Oral, Every 12 hours   estradiol 0.1 MG/GM vaginal cream INSERT TWICE WEEKLY    ezetimibe-simvastatin (VYTORIN) 10-20 MG  TAKE 1 TABLET  DAILY    fexofenadine  180 mg, Daily   fluticasone /VERAMYST/SPRAY   2 sprays  Nasal, Daily PRN   IRON 50 mg Daily   labetalol  100 MG tablet TAKE 1 TABLET AT BEDTIME   Magnesium 400 MG CAPS Daily   methocarbamol 500 mg   Every 8 hours PRN   Multiple Vitamins-Minerals  1 tablet Daily   nitroGLYCERIN  0.4 mg Sublingual, Every 5 min PRN   pantoprazole  40 MG tablet Take  1 tablet  2 x /day    Vitamin D  2,000 Units 2 times daily   Zinc 50 MG TABS  Daily     Allergies  Allergen Reactions   Altace [Ramipril]     cough   Azithromycin     palpitation   Celebrex [Celecoxib]     Gi upset    Hydrochlorothiazide     cramping   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   Prilosec [Omeprazole]     constipation   Tape    Tapentadol redness & itch     Past Medical History:  Diagnosis Date   COPD (chronic obstructive pulmonary disease) (HCC)    Fibromyalgia    GERD (gastroesophageal reflux disease)    IBS (irritable bowel syndrome)    Scoliosis      Health Maintenance  Topic Date Due   Hepatitis C Screening  Never done   Zoster Vaccines- Shingrix (1 of 2) Never done   FOOT EXAM  07/31/2015   COVID-19 Vaccine (3 - Pfizer risk series) 05/24/2019   COLONOSCOPY  01/03/2020   INFLUENZA VACCINE  09/29/2020   URINE MICROALBUMIN  10/29/2020   HEMOGLOBIN A1C  11/12/2020   OPHTHALMOLOGY EXAM  11/27/2020   TETANUS/TDAP  02/11/2028   DEXA SCAN  Completed   HPV VACCINES  Aged Out     Immunization History  Administered Date(s) Administered   Influenza, High Dose  11/11/2014, 12/08/2015   Influenza,inj,Quad  11/27/2016, 11/20/2017, 11/14/2018   Influenza 11/20/2019   PFIZER SARS-COV-2 Vacc 04/03/2019, 04/26/2019   Pneumococcal -13 04/16/2014   Pneumococcal-23 03/01/1998, 03/12/2010   Td 03/27/2012   Tdap 02/10/2018   Zoster, Live 03/02/2007    Last Colon - 07/21/2012 - Dr Matthias Hughs - recc 5 yr f/u, then  on 01/07/2020 Re-scheduled by Dr Matthias Hughs  for 9 years  - Nov 2023.   Last MGM - 03/04/2021 - Negative Recc f/u Nov 2023  Last dexaBMD - 05/26/2021 Osteopenia -2.3 Lt forearm  Past Surgical History:  Procedure Laterality Date   ABDOMINAL HYSTERECTOMY     APPENDECTOMY     CORONARY ANGIOPLASTY  12/11   CORONARY ARTERY BYPASS GRAFT     CYSTOCELE REPAIR  03/12/2019   cystocele and rectocele repair, Dr. Jennette Kettle   ESOPHAGOGASTRODUODENOSCOPY  07/21/12   polyps, Hiatal hernia Dr. Matthias Hughs   TONSILLECTOMY     TUBAL LIGATION       Family History  Problem Relation Age of Onset   Heart disease Brother    Hyperlipidemia Brother      Social History   Tobacco Use   Smoking status: Former    Types: Cigarettes    Quit date: 09/03/1965    Years since quitting: 55.2   Smokeless tobacco: Never  Substance Use Topics   Alcohol use: Yes    Alcohol/week: 3.0 standard drinks    Types: 3 Standard drinks or equivalent per week    Comment: rare   Drug use: No      ROS Constitutional: Denies fever, chills, weight loss/gain, headaches, insomnia,  night sweats, and change in appetite. Does c/o fatigue. Eyes: Denies redness, blurred vision, diplopia, discharge, itchy, watery eyes.  ENT: Denies discharge, congestion, post nasal drip, epistaxis, sore throat, earache, hearing loss, dental pain, Tinnitus, Vertigo, Sinus pain, snoring.  Cardio: Denies chest pain, palpitations, irregular heartbeat, syncope, dyspnea, diaphoresis, orthopnea, PND, claudication, edema Respiratory: denies cough, dyspnea, DOE, pleurisy, hoarseness, laryngitis, wheezing.   Gastrointestinal: Denies dysphagia, heartburn, reflux, water brash, pain, cramps, nausea, vomiting, bloating, diarrhea, constipation, hematemesis, melena, hematochezia, jaundice, hemorrhoids Genitourinary: Denies dysuria, frequency, urgency, nocturia, hesitancy, discharge, hematuria, flank pain  Breast: Breast lumps, nipple discharge, bleeding.  Musculoskeletal: Denies arthralgia, myalgia, stiffness, Jt. Swelling, pain, limp, and strain/sprain. Denies falls. Skin: Denies puritis, rash, hives, warts, acne, eczema, changing in skin lesion Neuro: No weakness, tremor, incoordination, spasms, paresthesia, pain Psychiatric: Denies confusion, memory loss, sensory loss. Denies Depression. Endocrine: Denies change in weight, skin, hair change, nocturia, and paresthesia, diabetic polys, visual blurring, hyper / hypo glycemic episodes.  Heme/Lymph: No excessive bleeding, bruising, enlarged lymph nodes.  Physical Exam  BP 120/68   Pulse 77   Temp 97.9 F (36.6 C)   Resp 16   Ht 5\' 2"  (1.575 m)   Wt 148 lb 9.6 oz (67.4 kg)   SpO2 97%   BMI 27.18 kg/m   General Appearance: Well nourished, well groomed and in no apparent distress.  Eyes: PERRLA, EOMs, conjunctiva no swelling or erythema, normal fundi and vessels. Sinuses: No frontal/maxillary tenderness ENT/Mouth: EACs patent / TMs  nl. Nares clear without erythema, swelling, mucoid exudates. Oral hygiene is good. No erythema, swelling, or exudate. Tongue normal, non-obstructing. Tonsils not swollen or erythematous. Hearing normal.  Neck: Supple, thyroid not palpable. No bruits, nodes or JVD. Respiratory: Respiratory effort normal.  BS equal and clear bilateral without rales, rhonci, wheezing or stridor. Cardio: Heart sounds are normal with regular rate and rhythm and no murmurs, rubs or gallops. Peripheral pulses are normal and equal bilaterally without edema. No aortic or femoral bruits. Chest: symmetric & Kyphotic with normal excursions and  percussion. Breasts: Symmetric, without lumps, nipple discharge, retractions, or fibrocystic changes.  Abdomen: protuberant,  soft with bowel sounds active. Nontender, no guarding, rebound, hernias, masses, or organomegaly.  Lymphatics: Non tender without lymphadenopathy.  Genitourinary:  Musculoskeletal: Full ROM all peripheral extremities, joint stability, 5/5 strength, and normal gait. Skin: Warm and dry without rashes, lesions, cyanosis, clubbing or  ecchymosis.  Neuro: Cranial nerves intact, reflexes equal bilaterally. Normal muscle tone, no cerebellar symptoms. Sensation intact.  Pysch: Alert and oriented X 3, normal affect, Insight and Judgment appropriate.    Assessment and Plan  1. Annual Preventative Screening Examination   2. Essential hypertension  - EKG 12-Lead - Urinalysis, Routine w reflex microscopic - TSH - Microalbumin / creatinine urine ratio  3. Hyperlipidemia, mixed  - EKG 12-Lead - TSH  4. Abnormal glucose  - EKG 12-Lead - Hemoglobin A1c - Insulin, random  5. Vitamin D deficiency  - VITAMIN D 25 Hydroxy   6. OSA on CPAP  - Microalbumin / creatinine urine ratio  7. Gastroesophageal reflux disease   8. 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 Aortic atherosclerosis (HCC) by Abd CT scan on 02/16/2020  - EKG 12-Lead  10. Chronic obstructive pulmonary disease (HCC)   11. Screening for colorectal cancer  - POC Hemoccult Bld/Stl   12. Screening for heart disease  - EKG 12-Lead  13. FHx: heart disease  - EKG 12-Lead  14. Former smoker  - EKG 12-Lead  15. Medication management  - Urinalysis, Routine w reflex microscopic - CBC with Differential/Platelet - COMPLETE METABOLIC PANEL WITH GFR - Magnesium - Lipid panel - TSH - Hemoglobin A1c - Insulin, random - VITAMIN D 25 Hydroxy - Microalbumin / creatinine urine  ratio           Patient was counseled in prudent diet to achieve/maintain BMI less than 25 for weight control, BP monitoring, regular exercise and medications. Discussed med's effects and  SE's. Screening labs and tests as requested with regular follow-up as recommended. Over 40 minutes of exam, counseling, chart review and high complex critical decision making was performed.   Kirtland Bouchard, MD

## 2021-12-03 ENCOUNTER — Encounter: Payer: Self-pay | Admitting: Internal Medicine

## 2021-12-03 ENCOUNTER — Ambulatory Visit (INDEPENDENT_AMBULATORY_CARE_PROVIDER_SITE_OTHER): Payer: Medicare PPO | Admitting: Internal Medicine

## 2021-12-03 VITALS — BP 120/68 | HR 77 | Temp 97.9°F | Resp 16 | Ht 62.0 in | Wt 148.6 lb

## 2021-12-03 DIAGNOSIS — Z79899 Other long term (current) drug therapy: Secondary | ICD-10-CM

## 2021-12-03 DIAGNOSIS — I25118 Atherosclerotic heart disease of native coronary artery with other forms of angina pectoris: Secondary | ICD-10-CM

## 2021-12-03 DIAGNOSIS — Z136 Encounter for screening for cardiovascular disorders: Secondary | ICD-10-CM

## 2021-12-03 DIAGNOSIS — R7309 Other abnormal glucose: Secondary | ICD-10-CM

## 2021-12-03 DIAGNOSIS — E782 Mixed hyperlipidemia: Secondary | ICD-10-CM | POA: Diagnosis not present

## 2021-12-03 DIAGNOSIS — I10 Essential (primary) hypertension: Secondary | ICD-10-CM

## 2021-12-03 DIAGNOSIS — Z0001 Encounter for general adult medical examination with abnormal findings: Secondary | ICD-10-CM

## 2021-12-03 DIAGNOSIS — Z0000 Encounter for general adult medical examination without abnormal findings: Secondary | ICD-10-CM | POA: Diagnosis not present

## 2021-12-03 DIAGNOSIS — G4733 Obstructive sleep apnea (adult) (pediatric): Secondary | ICD-10-CM | POA: Diagnosis not present

## 2021-12-03 DIAGNOSIS — E559 Vitamin D deficiency, unspecified: Secondary | ICD-10-CM

## 2021-12-03 DIAGNOSIS — I700 Atherosclerosis of aorta: Secondary | ICD-10-CM

## 2021-12-03 DIAGNOSIS — Z87891 Personal history of nicotine dependence: Secondary | ICD-10-CM

## 2021-12-03 DIAGNOSIS — Z1211 Encounter for screening for malignant neoplasm of colon: Secondary | ICD-10-CM

## 2021-12-03 DIAGNOSIS — K219 Gastro-esophageal reflux disease without esophagitis: Secondary | ICD-10-CM

## 2021-12-03 DIAGNOSIS — J449 Chronic obstructive pulmonary disease, unspecified: Secondary | ICD-10-CM

## 2021-12-03 DIAGNOSIS — Z8249 Family history of ischemic heart disease and other diseases of the circulatory system: Secondary | ICD-10-CM

## 2021-12-03 MED ORDER — FUROSEMIDE 20 MG PO TABS: ORAL_TABLET | ORAL | 3 refills | Status: DC

## 2021-12-04 LAB — COMPLETE METABOLIC PANEL WITH GFR
AG Ratio: 2.3 (calc) (ref 1.0–2.5)
ALT: 21 U/L (ref 6–29)
AST: 21 U/L (ref 10–35)
Albumin: 4.5 g/dL (ref 3.6–5.1)
Alkaline phosphatase (APISO): 58 U/L (ref 37–153)
BUN: 20 mg/dL (ref 7–25)
CO2: 27 mmol/L (ref 20–32)
Calcium: 9.2 mg/dL (ref 8.6–10.4)
Chloride: 105 mmol/L (ref 98–110)
Creat: 0.78 mg/dL (ref 0.60–1.00)
Globulin: 2 g/dL (calc) (ref 1.9–3.7)
Glucose, Bld: 87 mg/dL (ref 65–99)
Potassium: 4 mmol/L (ref 3.5–5.3)
Sodium: 141 mmol/L (ref 135–146)
Total Bilirubin: 0.4 mg/dL (ref 0.2–1.2)
Total Protein: 6.5 g/dL (ref 6.1–8.1)
eGFR: 77 mL/min/{1.73_m2} (ref >=60)

## 2021-12-04 LAB — CBC WITH DIFFERENTIAL/PLATELET
Absolute Monocytes: 534 cells/uL (ref 200–950)
Basophils Absolute: 52 cells/uL (ref 0–200)
Basophils Relative: 0.9 %
Eosinophils Absolute: 162 cells/uL (ref 15–500)
Eosinophils Relative: 2.8 %
HCT: 37.1 % (ref 35.0–45.0)
Hemoglobin: 12.6 g/dL (ref 11.7–15.5)
Lymphs Abs: 1346 cells/uL (ref 850–3900)
MCH: 31.3 pg (ref 27.0–33.0)
MCHC: 34 g/dL (ref 32.0–36.0)
MCV: 92.1 fL (ref 80.0–100.0)
MPV: 9.8 fL (ref 7.5–12.5)
Monocytes Relative: 9.2 %
Neutro Abs: 3706 cells/uL (ref 1500–7800)
Neutrophils Relative %: 63.9 %
Platelets: 186 10*3/uL (ref 140–400)
RBC: 4.03 10*6/uL (ref 3.80–5.10)
RDW: 11.8 % (ref 11.0–15.0)
Total Lymphocyte: 23.2 %
WBC: 5.8 10*3/uL (ref 3.8–10.8)

## 2021-12-04 LAB — HEMOGLOBIN A1C
Hgb A1c MFr Bld: 5.7 % of total Hgb — ABNORMAL HIGH (ref <5.7)
Mean Plasma Glucose: 117 mg/dL
eAG (mmol/L): 6.5 mmol/L

## 2021-12-04 LAB — LIPID PANEL
Cholesterol: 148 mg/dL (ref <200)
HDL: 65 mg/dL (ref >=50)
LDL Cholesterol (Calc): 63 mg/dL (calc)
Non-HDL Cholesterol (Calc): 83 mg/dL (calc) (ref <130)
Total CHOL/HDL Ratio: 2.3 (calc) (ref <5.0)
Triglycerides: 115 mg/dL (ref <150)

## 2021-12-04 LAB — URINALYSIS, ROUTINE W REFLEX MICROSCOPIC
Bilirubin Urine: NEGATIVE
Glucose, UA: NEGATIVE
Hgb urine dipstick: NEGATIVE
Ketones, ur: NEGATIVE
Leukocytes,Ua: NEGATIVE
Nitrite: NEGATIVE
Protein, ur: NEGATIVE
Specific Gravity, Urine: 1.021 (ref 1.001–1.035)
pH: <=5 (ref 5.0–8.0)

## 2021-12-04 LAB — VITAMIN D 25 HYDROXY (VIT D DEFICIENCY, FRACTURES): Vit D, 25-Hydroxy: 71 ng/mL (ref 30–100)

## 2021-12-04 LAB — TSH: TSH: 3.76 mIU/L (ref 0.40–4.50)

## 2021-12-04 LAB — MAGNESIUM: Magnesium: 2.2 mg/dL (ref 1.5–2.5)

## 2021-12-04 LAB — INSULIN, RANDOM: Insulin: 19.5 u[IU]/mL — ABNORMAL HIGH

## 2021-12-04 LAB — MICROALBUMIN / CREATININE URINE RATIO
Creatinine, Urine: 105 mg/dL (ref 20–275)
Microalb Creat Ratio: 8 mcg/mg creat (ref <30)
Microalb, Ur: 0.8 mg/dL

## 2021-12-04 NOTE — Progress Notes (Signed)
<><><><><><><><><><><><><><><><><><><><><><><><><><><><><><><><><> <><><><><><><><><><><><><><><><><><><><><><><><><><><><><><><><><> -   Test results slightly outside the reference range are not unusual. If there is anything important, I will review this with you,  otherwise it is considered normal test values.  If you have further questions,  please do not hesitate to contact me at the office or via My Chart.  <><><><><><><><><><><><><><><><><><><><><><><><><><><><><><><><><> <><><><><><><><><><><><><><><><><><><><><><><><><><><><><><><><><>  -  A1c = 5.7% -~ about the same  still borderline in the early or preDiabetic range   - Avoid Sweets, Candy & White Stuff   - White Rice, White [place], White Flour  - Breads &  Pasta <><><><><><><><><><><><><><><><><><><><><><><><><><><><><><><><><> <><><><><><><><><><><><><><><><><><><><><><><><><><><><><><><><><>  -  Total Chol = 148   & LDL Chol = 63   - Both  Excellent   - Very low risk for Heart Attack  / Stroke <><><><><><><><><><><><><><><><><><><><><><><><><><><><><><><><><> <><><><><><><><><><><><><><><><><><><><><><><><><><><><><><><><><>  -  Vitamin D = 71 - Excellent - Please keep dose same  <><><><><><><><><><><><><><><><><><><><><><><><><><><><><><><><><> <><><><><><><><><><><><><><><><><><><><><><><><><><><><><><><><><>  -  All Else - CBC - Kidneys - Electrolytes - Liver - Magnesium & Thyroid    - all  Normal / OK <><><><><><><><><><><><><><><><><><><><><><><><><><><><><><><><><> <><><><><><><><><><><><><><><><><><><><><><><><><><><><><><><><><>  -  Keep up the [place] work !   <><><><><><><><><><><><><><><><><><><><><><><><><><><><><><><><><> <><><><><><><><><><><><><><><><><><><><><><><><><><><><><><><><><>

## 2021-12-05 ENCOUNTER — Encounter: Payer: Self-pay | Admitting: Internal Medicine

## 2021-12-22 ENCOUNTER — Other Ambulatory Visit: Payer: Self-pay | Admitting: Internal Medicine

## 2021-12-22 DIAGNOSIS — Z1231 Encounter for screening mammogram for malignant neoplasm of breast: Secondary | ICD-10-CM

## 2021-12-24 ENCOUNTER — Telehealth: Payer: Self-pay | Admitting: *Deleted

## 2021-12-24 NOTE — Telephone Encounter (Signed)
Patient called in to say that she needs a RX for a new mask. She is not happy with the N30i mask given to her at her last visit. She is Requesting to go back to previous mask. After continued conversation it was discovered that she had not received the new CPAP machine that was ordered back in April of this year. [facility] was contacted by me and I was told by Colletta Maryland that her order was canceled on August the 8th. Colletta Maryland could not give me a explanation why it was canceled, other than "sometimes the system will cancel a order and we are not aware of it until we go into the patient's chart." Colletta Maryland went on to say that the CPAP order as well as the face to face is now out of date and cannot be used. I expressed how it is not the patient's fault that the order was inadvertently canceled. I will not be able to get her back into the office for another face to face visit before the end of the year, which will be a potential huge expense for the patient. Her deductibles will more than likely start over not to mention the expense of another office appointment. Stephanie placed me on hold for a few minutes. Upon returning to the phone she states that they are going to try to "push the order through." They may only need a updated prescription. Once the order has been submitted for insurance review they will contact me back to let me know what is needed. In the interim I told the patient to come by the office and I will work with her to get her another mask. She will call me prior to coming to let me know what type of mask she was using prior to the N30i given to her at the last visit.

## 2022-01-02 ENCOUNTER — Other Ambulatory Visit: Payer: Self-pay | Admitting: Nurse Practitioner

## 2022-01-02 DIAGNOSIS — E782 Mixed hyperlipidemia: Secondary | ICD-10-CM

## 2022-01-02 DIAGNOSIS — I10 Essential (primary) hypertension: Secondary | ICD-10-CM

## 2022-01-30 ENCOUNTER — Other Ambulatory Visit: Payer: Self-pay | Admitting: Cardiovascular Disease

## 2022-02-15 ENCOUNTER — Ambulatory Visit
Admission: RE | Admit: 2022-02-15 | Discharge: 2022-02-15 | Disposition: A | Discharge status: Home / Self Care | Payer: Medicare PPO | Source: Ambulatory Visit | Attending: Internal Medicine | Admitting: Internal Medicine

## 2022-02-15 ENCOUNTER — Ambulatory Visit: Payer: Medicare PPO

## 2022-02-15 DIAGNOSIS — Z1231 Encounter for screening mammogram for malignant neoplasm of breast: Secondary | ICD-10-CM | POA: Diagnosis not present

## 2022-02-18 ENCOUNTER — Other Ambulatory Visit: Payer: Self-pay | Admitting: Internal Medicine

## 2022-02-18 DIAGNOSIS — R928 Other abnormal and inconclusive findings on diagnostic imaging of breast: Secondary | ICD-10-CM

## 2022-03-10 ENCOUNTER — Other Ambulatory Visit: Payer: Medicare PPO

## 2022-03-11 ENCOUNTER — Ambulatory Visit: Payer: Medicare PPO | Admitting: Nurse Practitioner

## 2022-03-11 ENCOUNTER — Telehealth: Payer: Self-pay | Admitting: Nurse Practitioner

## 2022-03-11 NOTE — Telephone Encounter (Signed)
Jasmine Reeves did an otp visit with husband yesterday for COVID +, she is now having similar sx's. Does she need to have a + covid test to do the phone visit or can we do a phone visit w/o it.

## 2022-03-12 ENCOUNTER — Ambulatory Visit (INDEPENDENT_AMBULATORY_CARE_PROVIDER_SITE_OTHER): Payer: Medicare PPO | Admitting: Nurse Practitioner

## 2022-03-12 ENCOUNTER — Encounter: Payer: Self-pay | Admitting: Nurse Practitioner

## 2022-03-12 VITALS — BP 122/63 | HR 67 | Temp 97.7°F

## 2022-03-12 DIAGNOSIS — R051 Acute cough: Secondary | ICD-10-CM | POA: Diagnosis not present

## 2022-03-12 DIAGNOSIS — U071 COVID-19: Secondary | ICD-10-CM

## 2022-03-12 DIAGNOSIS — J449 Chronic obstructive pulmonary disease, unspecified: Secondary | ICD-10-CM | POA: Diagnosis not present

## 2022-03-12 MED ORDER — PROMETHAZINE-DM 6.25-15 MG/5ML PO SYRP: 5.0000 mL | ORAL_SOLUTION | Freq: Four times a day (QID) | ORAL | 0 refills | Status: DC | PRN

## 2022-03-12 MED ORDER — PREDNISONE 10 MG PO TABS: ORAL_TABLET | ORAL | 0 refills | Status: DC

## 2022-03-12 MED ORDER — ALBUTEROL SULFATE HFA 108 (90 BASE) MCG/ACT IN AERS: 2.0000 | INHALATION_SPRAY | Freq: Four times a day (QID) | RESPIRATORY_TRACT | 2 refills | Status: DC | PRN

## 2022-03-12 NOTE — Progress Notes (Unsigned)
THIS ENCOUNTER IS A VIRTUAL VISIT DUE TO COVID-19 - PATIENT WAS NOT SEEN IN THE OFFICE.  PATIENT HAS CONSENTED TO VIRTUAL VISIT / TELEMEDICINE VISIT   Virtual Visit via telephone Note  I connected with  Jasmine Reeves on 03/12/2022 by telephone.  I verified that I am speaking with the correct person using two identifiers.    I discussed the limitations of evaluation and management by telemedicine and the availability of in person appointments. The patient expressed understanding and agreed to proceed.  History of Present Illness:  BP 122/63   Pulse 67   Temp 97.7 F (36.5 C)   SpO2 96%   82 y.o. patient contacted office reporting URI sx fatigue, headache, cough nasal congestion. she tested positive by home test. OV was conducted by telephone to minimize exposure. This patient has vaccinated for covid 19, last 04/2019  Sx began 2 days ago with cough  Treatments tried so far: tylenol, OTC cough suppressant  Exposures: Husband positive   Medications  Current Outpatient Medications (Endocrine & Metabolic):    alendronate (FOSAMAX) 70 MG tablet, Take 1 tablet (70 mg total) by mouth once a week. Take with a full glass of water on an empty stomach.  Current Outpatient Medications (Cardiovascular):    diltiazem (DILACOR XR) 180 MG 24 hr capsule, TAKE 1 CAPSULE BY MOUTH DAILY   ezetimibe-simvastatin (VYTORIN) 10-20 MG tablet, TAKE 1 TABLET BY MOUTH DAILY FOR CHOLESTEROL   furosemide (LASIX) 20 MG tablet, Take 1 tablet Daily as needed for Fluid Retention  and Ankle / Leg Swelling   labetalol (NORMODYNE) 100 MG tablet, TAKE 1 TABLET(100 MG) BY MOUTH AT BEDTIME   nitroGLYCERIN (NITROSTAT) 0.4 MG SL tablet, Place 1 tablet (0.4 mg total) under the tongue every 5 (five) minutes as needed for chest pain.  Current Outpatient Medications (Respiratory):    cetirizine (ZYRTEC) 10 MG tablet, Take 10 mg by mouth daily.   Fluticasone Propionate (FLONASE NA), Place into the nose.   fexofenadine  (ALLEGRA) 180 MG tablet, Take 180 mg by mouth daily. (Patient not taking: Reported on 03/12/2022)   fluticasone (VERAMYST) 27.5 MCG/SPRAY nasal spray, Place 2 sprays into the nose daily as needed for rhinitis. (Patient not taking: Reported on 03/12/2022)  Current Outpatient Medications (Analgesics):    aspirin 81 MG tablet, Take 81 mg by mouth daily.  Current Outpatient Medications (Hematological):    IRON PO, Take 50 mg by mouth daily.  Current Outpatient Medications (Other):    CALCIUM CITRATE PO, Take 500 mg by mouth daily.   Cholecalciferol (VITAMIN D) 2000 UNITS tablet, Take 2,000 Units by mouth 2 (two) times daily.   estradiol (ESTRACE) 0.1 MG/GM vaginal cream, estradiol 0.01% (0.1 mg/gram) vaginal cream  INSERT 2 GRAMS INTO THE VAGINA DAILY FOR FIRST WEEK THEN TWICE WEEKLY AFTER FIRST WEEK   Magnesium 400 MG CAPS, Take by mouth daily.   Multiple Vitamins-Minerals (MULTIVITAMIN WITH MINERALS) tablet, Take 1 tablet by mouth daily.   pantoprazole (PROTONIX) 40 MG tablet, Take  1 tablet  2 x /day (every 12 hours ) to Prevent Heartburn & Indigestion   vitamin C (ASCORBIC ACID) 500 MG tablet, Take 500 mg by mouth daily.   Zinc 50 MG TABS, Take by mouth daily.   docusate sodium (COLACE) 100 MG capsule, Take 1 capsule (100 mg total) by mouth every 12 (twelve) hours. (Patient not taking: Reported on 03/12/2022)   methocarbamol (ROBAXIN) 500 MG tablet, Take 1 tablet (500 mg total) by mouth every 8 (eight)  hours as needed. For muscle spasms. Do not take and drive. (Patient not taking: Reported on 06/17/2021)  Allergies:  Allergies  Allergen Reactions   Altace [Ramipril]     cough   Azithromycin     palpitation   Celebrex [Celecoxib]     Gi upset    Hydrochlorothiazide     cramping   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   Prilosec [Omeprazole]     constipation   Tape Other (See Comments)   Tapentadol     Other reaction(s): Other (See Comments), redness &  itch    Problem list She has Essential hypertension; RAYNAUDS SYNDROME; COPD (chronic obstructive pulmonary disease) (HCC); Vitamin D deficiency; GERD (gastroesophageal reflux disease); CAD (coronary artery disease); MVP (mitral valve prolapse); Mixed hyperlipidemia; Abnormal blood sugar; Medication management; IBS (irritable bowel syndrome); Encounter for Medicare annual wellness exam; OSA on CPAP; Osteoporosis with current pathological fracture; BMI 23.0-23.9, adult; Cystocele with rectocele; Compression fracture of T12 vertebra (HCC); Pancreas cyst; and Aortic atherosclerosis (HCC) by Abd CT scan on 02/16/2020 on their problem list.   Social History:   reports that she quit smoking about 56 years ago. Her smoking use included cigarettes. She has never used smokeless tobacco. She reports current alcohol use of about 3.0 standard drinks of alcohol per week. She reports that she does not use drugs.  Observations/Objective:  General : Well sounding patient in no apparent distress HEENT: no hoarseness, no cough for duration of visit Lungs: speaks in complete sentences, no audible wheezing, no apparent distress Neurological: alert, oriented x 3 Psychiatric: pleasant, judgement appropriate   Assessment and Plan:  Covid 19 Covid 19 positive per rapid screening test in home Risk factors include: COPD Symptoms are: mild Due to co morbid conditions and risk factors, discussed antivirals Molnupiravir Immue support reviewed Vitamin C, D, zinc Take tylenol PRN temp 101+ Push hydration Regular ambulation or calf exercises exercises for clot prevention and 81 mg ASA unless contraindicated Sx supportive therapy suggested Follow up via mychart or telephone if needed Advised patient obtain O2 monitor; present to ED if persistently <90% or with severe dyspnea, CP, fever uncontrolled by tylenol, confusion, sudden decline       Should remain in isolation 5 days from testing positive and then wear a mask  when around other people for the following 5 days  - predniSONE (DELTASONE) 10 MG tablet; 1 tab 3 x day for 2 days, then 1 tab 2 x day for 2 days, then 1 tab 1 x day for 3 days  Dispense: 13 tablet; Refill: 0 - promethazine-dextromethorphan (PROMETHAZINE-DM) 6.25-15 MG/5ML syrup; Take 5 mLs by mouth 4 (four) times daily as needed for cough.  Dispense: 240 mL; Refill: 0 - albuterol (VENTOLIN HFA) 108 (90 Base) MCG/ACT inhaler; Inhale 2 puffs into the lungs every 6 (six) hours as needed for wheezing or shortness of breath.  Dispense: 8 g; Refill: 2  Acute cough  - predniSONE (DELTASONE) 10 MG tablet; 1 tab 3 x day for 2 days, then 1 tab 2 x day for 2 days, then 1 tab 1 x day for 3 days  Dispense: 13 tablet; Refill: 0 - promethazine-dextromethorphan (PROMETHAZINE-DM) 6.25-15 MG/5ML syrup; Take 5 mLs by mouth 4 (four) times daily as needed for cough.  Dispense: 240 mL; Refill: 0 - albuterol (VENTOLIN HFA) 108 (90 Base) MCG/ACT inhaler; Inhale 2 puffs into the lungs every 6 (six) hours as needed for wheezing or shortness of breath.  Dispense: 8 g; Refill: 2  Chronic obstructive pulmonary disease, unspecified COPD type (HCC)  - predniSONE (DELTASONE) 10 MG tablet; 1 tab 3 x day for 2 days, then 1 tab 2 x day for 2 days, then 1 tab 1 x day for 3 days  Dispense: 13 tablet; Refill: 0 - promethazine-dextromethorphan (PROMETHAZINE-DM) 6.25-15 MG/5ML syrup; Take 5 mLs by mouth 4 (four) times daily as needed for cough.  Dispense: 240 mL; Refill: 0 - albuterol (VENTOLIN HFA) 108 (90 Base) MCG/ACT inhaler; Inhale 2 puffs into the lungs every 6 (six) hours as needed for wheezing or shortness of breath.  Dispense: 8 g; Refill: 2  Follow Up Instructions:  I discussed the assessment and treatment plan with the patient. The patient was provided an opportunity to ask questions and all were answered. The patient agreed with the plan and demonstrated an understanding of the instructions.   The patient was advised to  call back or seek an in-person evaluation if the symptoms worsen or if the condition fails to improve as anticipated.  I provided 15 minutes of non-face-to-face time during this encounter.   Darrol Jump, NP

## 2022-03-13 DIAGNOSIS — G4733 Obstructive sleep apnea (adult) (pediatric): Secondary | ICD-10-CM | POA: Diagnosis not present

## 2022-03-25 ENCOUNTER — Ambulatory Visit: Payer: Medicare PPO | Admitting: Nurse Practitioner

## 2022-03-27 NOTE — Progress Notes (Deleted)
MEDICARE ANNUAL WELLNESS VISIT AND FOLLOW UP  Assessment:   Diagnoses and all orders for this visit:  Encounter for Medicare annual wellness exam Due annually   Atherosclerosis of aorta Per CT 2018 Control blood pressure, cholesterol, glucose, increase exercise.   Coronary artery disease involving native coronary artery of native heart with other form of angina pectoris ([place]) Control blood pressure, cholesterol, glucose, increase exercise.  Prescribed nitroglycerine without recent use Followed by cardiology  Essential hypertension At goal; continue medications Monitor blood pressure at home; call if consistently over 130/80 Continue DASH diet.   Reminder to go to the ER if any CP, SOB, nausea, dizziness, severe HA, changes vision/speech, left arm numbness and tingling and jaw pain.  MVP (mitral valve prolapse) Continue BB, monitor.   Raynaud's disease without gangrene No complications, Monitor  Chronic obstructive pulmonary disease, unspecified COPD type ([place]) By imaging; denies notable symptoms of dyspnea, secretions, continue to monitor. Remote hx of smoking.   OSA on CPAP Reports typically wears 100% of the time and endorses restorative sleep  Gastroesophageal reflux disease, esophagitis presence not specified Taking PPI BID, H2i didn't help; manages with carafate Encouraged follow up with GI to address need for high dose PPI, may need EGD Discussed diet, avoiding triggers and other lifestyle changes - Magnesium  Abnormal glucose Has been prediabetic; most recent A1C at goal Discussed disease and risks Discussed diet/exercise, weight management   Medication management -     CBC with Differential/Platelet -     CMP/GFR -     Magnesium   Mixed hyperlipidemia At goal; continue medication Continue low cholesterol diet and exercise.  -     Lipid panel   Vitamin D deficiency At goal at recent check; continue to recommend supplementation for goal of  70-100 Defer vitamin D level  Thoracic compression facture with non-union [facility]) Dr. Lorin Mercy following; see notes for ostepenia, DEXA Completed PT  Osteopenia of left forearm DEXA 2023, followed by Dr. 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 Sherrian Divers and doing well with pessary; discussed pelvic floor therapy; will follow up with uro/gyn   Over 40 minutes of exam, counseling, chart review and critical decision making was performed Future Appointments  Date Time Provider [place]  03/29/2022  3:30 PM Alycia Rossetti, NP [facility] None  05/19/2022  2:20 PM Troy Sine, MD [facility] None  06/15/2022  2:30 PM Unk Pinto, MD [facility] None  08/13/2022  2:30 PM Marybelle Killings, MD [facility] None  12/08/2022  2:00 PM Unk Pinto, MD [facility] None  06/08/2023 11:00 AM Alycia Rossetti, NP [facility] None     Plan:   During the course of the visit the patient was educated and counseled about appropriate screening and preventive services including:   Pneumococcal vaccine  Prevnar 13 Influenza vaccine Td vaccine Screening electrocardiogram Bone densitometry screening Colorectal cancer screening Diabetes screening Glaucoma screening Nutrition counseling  Advanced directives: requested   Subjective:  GUISELLE DALPORTO is a 82 y.o. female who presents for Medicare Annual Wellness Visit and 3 month follow up.    Follows annually with Dr. Nori Riis for mammograms and PAPs. Had known cystocele/rectocele, recently underwent surgical repair 03/12/2019 but unfortunately with repeated prolapse, was referred for second opinion  to [facility] Dr. Francia Greaves, was fitted for  pessary. Is not currently using pessary, had some issues with fit of pessary- she are continues to follow with Dr. Zigmund Daniel  The Abdominal CT in Dec 2021 also  showed 2 cysts in the pancreatic tail, patient was seen by Dr. Rolla Etienne, [facility] surgical oncologist and had an Abd MRI which showed 1 cyst - 1.6 cm. and recommendations were to "Reimage q65mox 4, then q1y x 2, then q2y x 3 -STOP if stable over 10 years ".  Patient had compression fracture T12 in 01/2020, has seen Dr. YLorin Mercy recent follow up 07/2020 showed non-union. She had follow up DEXA 04/2021 which shows osteopenia. Last visit with Dr. YLorin Mercywas 08/11/21- to complete full year of Fosamax and recheck DEXA 2025  she has a diagnosis of GERD which is currently managed by protonix 40 mg BID, carafate PRN due to breathrough, has followed up with Dr. BCristina Gong Last colonoscopy was 2014, was to have repeat in 2019 but did not have done due to Covid restrictions that occurred.  BMI is There is no height or weight on file to calculate BMI., she has been working on diet, not exercising as much- Dr. YLorin Mercywants her to walk 2 miles a day Wt Readings from Last 3 Encounters:  12/03/21 148 lb 9.6 oz (67.4 kg)  08/25/21 147 lb (66.7 kg)  06/17/21 143 lb (64.9 kg)   She has hx of PCA x 2 In Nov & Dec 2001, and CABG in Jan 2002. , she underwent CABG. Had reassuring myocardial perfusion and ECHO in 12/2018 other than known MVP and mild aortic stenosis Continues to follow up with Dr. KClaiborne Billings  She has aortic atherosclerosis and COPD per CXR in 2018/2019  Her blood pressure has been controlled at home, today their BP is   She does not workout. She denies chest pain, shortness of breath, dizziness.   She is on cholesterol medication (vytorin 10/20) and denies myalgias. Her cholesterol is at goal. The cholesterol last visit was:   Lab Results  Component Value Date   CHOL 148 12/03/2021   HDL 65 12/03/2021   LDLCALC 63 12/03/2021   TRIG 115 12/03/2021    CHOLHDL 2.3 12/03/2021    She has been working on diet and exercise for glucose management, and denies increased appetite, nausea, paresthesia of the feet, polydipsia, polyuria, visual disturbances, vomiting and weight loss. Last A1C in the office was:  Lab Results  Component Value Date   HGBA1C 5.7 (H) 12/03/2021   Last GFR demonstrates stage 2 ckd: Lab Results  Component Value Date   [facility] 09/[facility]   Patient is on Vitamin D supplement and at goal at most recent check:  Lab Results  Component Value Date   VD25OH 71 12/03/2021      Medication Review: Current Outpatient Medications on File Prior to Visit  Medication Sig Dispense Refill   albuterol (VENTOLIN HFA) 108 (90 Base) MCG/ACT inhaler Inhale 2 puffs into the lungs every 6 (six) hours as needed for wheezing or shortness of breath. 8 g 2   alendronate (FOSAMAX) 70 MG tablet Take 1 tablet (70 mg total) by mouth once a week. Take with a full glass of water on an empty stomach. 12 tablet 4   aspirin 81 MG tablet Take 81 mg by mouth daily.     CALCIUM CITRATE PO Take 500 mg by mouth daily.     cetirizine (ZYRTEC) 10 MG tablet Take 10  mg by mouth daily.     Cholecalciferol (VITAMIN D) 2000 UNITS tablet Take 2,000 Units by mouth 2 (two) times daily.     diltiazem (DILACOR XR) 180 MG 24 hr capsule TAKE 1 CAPSULE BY MOUTH DAILY 90 capsule 3   docusate sodium (COLACE) 100 MG capsule Take 1 capsule (100 mg total) by mouth every 12 (twelve) hours. (Patient not taking: Reported on 03/12/2022) 60 capsule 0   estradiol (ESTRACE) 0.1 MG/GM vaginal cream estradiol 0.01% (0.1 mg/gram) vaginal cream  INSERT 2 GRAMS INTO THE VAGINA DAILY FOR FIRST WEEK THEN TWICE WEEKLY AFTER FIRST WEEK     ezetimibe-simvastatin (VYTORIN) 10-20 MG tablet TAKE 1 TABLET BY MOUTH DAILY FOR CHOLESTEROL 90 tablet 1   fexofenadine (ALLEGRA) 180 MG tablet Take 180 mg by mouth daily. (Patient not taking: Reported on 03/12/2022)     fluticasone (VERAMYST) 27.5  MCG/SPRAY nasal spray Place 2 sprays into the nose daily as needed for rhinitis. (Patient not taking: Reported on 03/12/2022) 10 g 8   Fluticasone Propionate (FLONASE NA) Place into the nose.     furosemide (LASIX) 20 MG tablet Take 1 tablet Daily as needed for Fluid Retention  and Ankle / Leg Swelling 90 tablet 3   IRON PO Take 50 mg by mouth daily.     labetalol (NORMODYNE) 100 MG tablet TAKE 1 TABLET(100 MG) BY MOUTH AT BEDTIME 90 tablet 3   Magnesium 400 MG CAPS Take by mouth daily.     methocarbamol (ROBAXIN) 500 MG tablet Take 1 tablet (500 mg total) by mouth every 8 (eight) hours as needed. For muscle spasms. Do not take and drive. (Patient not taking: Reported on 06/17/2021) 60 tablet 0   Multiple Vitamins-Minerals (MULTIVITAMIN WITH MINERALS) tablet Take 1 tablet by mouth daily.     nitroGLYCERIN (NITROSTAT) 0.4 MG SL tablet Place 1 tablet (0.4 mg total) under the tongue every 5 (five) minutes as needed for chest pain. 25 tablet 12   pantoprazole (PROTONIX) 40 MG tablet Take  1 tablet  2 x /day (every 12 hours ) to Prevent Heartburn & Indigestion 180 tablet 3   predniSONE (DELTASONE) 10 MG tablet 1 tab 3 x day for 2 days, then 1 tab 2 x day for 2 days, then 1 tab 1 x day for 3 days 13 tablet 0   promethazine-dextromethorphan (PROMETHAZINE-DM) 6.25-15 MG/5ML syrup Take 5 mLs by mouth 4 (four) times daily as needed for cough. 240 mL 0   vitamin C (ASCORBIC ACID) 500 MG tablet Take 500 mg by mouth daily.     Zinc 50 MG TABS Take by mouth daily.     No current facility-administered medications on file prior to visit.    Allergies  Allergen Reactions   Altace [Ramipril]     cough   Azithromycin     palpitation   Celebrex [Celecoxib]     Gi upset    Hydrochlorothiazide     cramping   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   Prilosec [Omeprazole]     constipation   Tape Other (See Comments)   Tapentadol     Other reaction(s): Other (See Comments), redness  & itch    Current Problems (verified) Patient Active Problem List   Diagnosis Date Noted   Aortic atherosclerosis ([place]) by Abd CT scan on 02/16/2020 05/11/2020   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   Compression fracture of T12 vertebra ([place]) 02/21/2020   Cystocele with rectocele 12/13/2018   BMI  23.0-23.9, adult 11/01/2017   Osteoporosis with current pathological fracture 04/12/2017   OSA on CPAP 11/27/2014   IBS (irritable bowel syndrome) 11/11/2014   Encounter for Medicare annual wellness exam 11/11/2014   Medication management 04/12/2014   Abnormal blood sugar 10/10/2013   Mixed hyperlipidemia 10/08/2013   CAD (coronary artery disease) 02/03/2013   MVP (mitral valve prolapse) 02/03/2013   Vitamin D deficiency    GERD (gastroesophageal reflux disease)    COPD (chronic obstructive pulmonary disease) ([place]) 02/11/2010   Essential hypertension 01/28/2010   RAYNAUDS SYNDROME 01/28/2010    Screening Tests Immunization History  Administered Date(s) Administered   Influenza Inj Mdck Quad Pf 11/29/2021   Influenza, High Dose Seasonal PF 11/11/2014, 12/08/2015   Influenza,inj,Quad PF,6+ Mos 11/27/2016, 11/20/2017, 11/14/2018, 11/11/2020   Influenza-Unspecified 11/20/2019   PFIZER(Purple Top)SARS-COV-2 Vaccination 04/03/2019, 04/26/2019   Pneumococcal Conjugate-13 04/16/2014   Pneumococcal-Unspecified 03/01/1998, 03/12/2010   Td 03/27/2012   Tdap 02/10/2018   Zoster, Live 03/02/2007   Health Maintenance  Topic Date Due   Medicare Annual Wellness (AWV)  Never done   Zoster Vaccines- Shingrix (1 of 2) Never done   COVID-19 Vaccine (3 - Pfizer risk series) 05/24/2019   Pneumonia Vaccine 72+ Years old (2 - PPSV23 or PCV20) 08/26/2022 (Originally 04/17/2015)   COLONOSCOPY (Pts 45-76yr Insurance coverage will need to be confirmed)  08/26/2022 (Originally 01/03/2020)   Hepatitis C Screening  08/26/2022 (Originally 10/09/1958)   OPHTHALMOLOGY EXAM  04/01/2022   HEMOGLOBIN A1C  06/04/2022    Diabetic kidney evaluation - eGFR measurement  12/04/2022   Diabetic kidney evaluation - Urine ACR  12/04/2022   DTaP/Tdap/Td (3 - Td or Tdap) 02/11/2028   INFLUENZA VACCINE  Completed   DEXA SCAN  Completed   HPV VACCINES  Aged Out   FOOT EXAM  Discontinued      Names of Other Physician/Practitioners you currently use: 1. [facility] here for primary care 2. Dr. GKaty Fitch eye doctor, last visit 2022 3. [place], dentist, last visit 04/2021, goes q 6 months 4. Dr. MLeonie Douglas derm, last 2022, annually   Patient Care Team: MUnk Pinto MD as PCP - General (Internal Medicine) KTroy Sine MD as PCP - Cardiology (Cardiology) KTroy Sine MD as Consulting Physician (Cardiology) BRonald Lobo MD as Consulting Physician (Gastroenterology) NMaisie Fus MD (Inactive) as Consulting Physician (Obstetrics and Gynecology)  SURGICAL HISTORY She  has a past surgical history that includes Tonsillectomy; Appendectomy; Abdominal hysterectomy; Tubal ligation; Coronary artery bypass graft; Coronary angioplasty (12/11); Esophagogastroduodenoscopy (07/21/12); and Cystocele repair (03/12/2019). FAMILY HISTORY Her family history includes Heart disease in her brother; Hyperlipidemia in her brother. SOCIAL HISTORY She  reports that she quit smoking about 56 years ago. Her smoking use included cigarettes. She has never used smokeless tobacco. She reports current alcohol use of about 3.0 standard drinks of alcohol per week. She reports that she does not use drugs.   MEDICARE WELLNESS OBJECTIVES: Physical activity:   Cardiac risk factors:   Depression/mood screen:      12/05/2021   10:04 PM  Depression screen PHQ 2/9  Decreased Interest 0  Down, Depressed, Hopeless 0  PHQ - 2 Score 0    ADLs:     12/05/2021   10:05 PM 08/25/2021    2:36 PM  In your present state of health, do you have any difficulty performing the following activities:  Hearing? 0 0   Vision? 0 0  Difficulty concentrating or making decisions? 0 0  Walking or  climbing stairs? 0 0  Dressing or bathing? 0 0  Doing errands, shopping? 0 0     Cognitive Testing  Alert? Yes  Normal Appearance?Yes  Oriented to person? Yes  Place? Yes   Time? Yes  Recall of three objects?  Yes  Can perform simple calculations? Yes  Displays appropriate judgment?Yes  Can read the correct time from a watch face?Yes  EOL planning:    Review of Systems  Constitutional:  Negative for chills, fever, malaise/fatigue and weight loss.  HENT:  Positive for sore throat. Negative for congestion, hearing loss and tinnitus.   Eyes:  Negative for blurred vision and double vision.  Respiratory:  Negative for cough, sputum production, shortness of breath and wheezing.   Cardiovascular:  Negative for chest pain, palpitations, orthopnea, claudication and leg swelling.  Gastrointestinal:  Negative for abdominal pain, blood in stool, constipation, diarrhea, heartburn, melena, nausea and vomiting.  Genitourinary:  Negative for dysuria, frequency (improved), hematuria and urgency.       Perineal discomfort following cystocele surgery  Musculoskeletal:  Positive for back pain (chronic, mid back, compression fracture). Negative for falls, joint pain and myalgias.  Skin:  Negative for rash.  Neurological:  Negative for dizziness, tingling, sensory change, weakness and headaches.  Endo/Heme/Allergies:  Negative for environmental allergies and polydipsia.  Psychiatric/Behavioral: Negative.  Negative for depression, memory loss and substance abuse. The patient is not nervous/anxious and does not have insomnia.   All other systems reviewed and are negative.    Objective:     There were no vitals filed for this visit.  There is no height or weight on file to calculate BMI.  General appearance: alert, no distress, WD/WN, female HEENT: normocephalic, sclerae anicteric, TMs pearly, nares patent, no discharge or  erythema, pharynx normal Oral cavity: MMM, no lesions Neck: supple, no lymphadenopathy, no thyromegaly, no masses Heart: RRR, normal S1, S2, no murmurs Lungs: CTA bilaterally, no wheezes, rhonchi, or rales Abdomen: +bs, soft, rounded, non tender, no masses, no hepatomegaly, no splenomegaly Musculoskeletal: nontender, no swelling, mild kyphoscoliosis Extremities: no edema, no cyanosis, no clubbing Pulses: 2+ symmetric, upper and lower extremities, normal cap refill Neurological: alert, oriented x 3, CN2-12 intact, strength normal upper extremities and lower extremities, sensation normal throughout, DTRs 2+ throughout, no cerebellar signs, gait normal Psychiatric: normal affect, behavior normal, pleasant   Medicare Attestation I have personally reviewed: The patient's medical and social history Their use of alcohol, tobacco or illicit drugs Their current medications and supplements The patient's functional ability including ADLs,fall risks, home safety risks, cognitive, and hearing and visual impairment Diet and physical activities Evidence for depression or mood disorders  The patient's weight, height, BMI, and visual acuity have been recorded in the chart.  I have made referrals, counseling, and provided education to the patient based on review of the above and I have provided the patient with a written personalized care plan for preventive services.     Alycia Rossetti, NP   03/27/2022

## 2022-03-29 ENCOUNTER — Ambulatory Visit: Payer: Medicare PPO | Admitting: Nurse Practitioner

## 2022-03-29 DIAGNOSIS — R7309 Other abnormal glucose: Secondary | ICD-10-CM

## 2022-03-29 DIAGNOSIS — E782 Mixed hyperlipidemia: Secondary | ICD-10-CM

## 2022-03-29 DIAGNOSIS — J449 Chronic obstructive pulmonary disease, unspecified: Secondary | ICD-10-CM

## 2022-03-29 DIAGNOSIS — I10 Essential (primary) hypertension: Secondary | ICD-10-CM

## 2022-03-29 DIAGNOSIS — I7300 Raynaud's syndrome without gangrene: Secondary | ICD-10-CM

## 2022-03-29 DIAGNOSIS — I25118 Atherosclerotic heart disease of native coronary artery with other forms of angina pectoris: Secondary | ICD-10-CM

## 2022-03-29 DIAGNOSIS — I700 Atherosclerosis of aorta: Secondary | ICD-10-CM

## 2022-03-29 DIAGNOSIS — G4733 Obstructive sleep apnea (adult) (pediatric): Secondary | ICD-10-CM

## 2022-03-29 DIAGNOSIS — M818 Other osteoporosis without current pathological fracture: Secondary | ICD-10-CM

## 2022-03-29 DIAGNOSIS — Z0000 Encounter for general adult medical examination without abnormal findings: Secondary | ICD-10-CM

## 2022-03-29 DIAGNOSIS — N8110 Cystocele, unspecified: Secondary | ICD-10-CM

## 2022-03-29 DIAGNOSIS — I341 Nonrheumatic mitral (valve) prolapse: Secondary | ICD-10-CM

## 2022-03-29 DIAGNOSIS — K219 Gastro-esophageal reflux disease without esophagitis: Secondary | ICD-10-CM

## 2022-03-29 DIAGNOSIS — Z79899 Other long term (current) drug therapy: Secondary | ICD-10-CM

## 2022-03-29 DIAGNOSIS — E559 Vitamin D deficiency, unspecified: Secondary | ICD-10-CM

## 2022-03-29 DIAGNOSIS — K862 Cyst of pancreas: Secondary | ICD-10-CM

## 2022-03-29 DIAGNOSIS — S22080K Wedge compression fracture of T11-T12 vertebra, subsequent encounter for fracture with nonunion: Secondary | ICD-10-CM

## 2022-03-30 ENCOUNTER — Ambulatory Visit: Payer: Medicare PPO | Admitting: Nurse Practitioner

## 2022-04-07 DIAGNOSIS — H04123 Dry eye syndrome of bilateral lacrimal glands: Secondary | ICD-10-CM | POA: Diagnosis not present

## 2022-04-07 DIAGNOSIS — R7309 Other abnormal glucose: Secondary | ICD-10-CM | POA: Diagnosis not present

## 2022-04-07 DIAGNOSIS — H2513 Age-related nuclear cataract, bilateral: Secondary | ICD-10-CM | POA: Diagnosis not present

## 2022-04-07 DIAGNOSIS — H10413 Chronic giant papillary conjunctivitis, bilateral: Secondary | ICD-10-CM | POA: Diagnosis not present

## 2022-04-07 LAB — HM DIABETES EYE EXAM

## 2022-04-13 DIAGNOSIS — G4733 Obstructive sleep apnea (adult) (pediatric): Secondary | ICD-10-CM | POA: Diagnosis not present

## 2022-04-15 ENCOUNTER — Encounter: Payer: Self-pay | Admitting: Nurse Practitioner

## 2022-04-15 ENCOUNTER — Ambulatory Visit (INDEPENDENT_AMBULATORY_CARE_PROVIDER_SITE_OTHER): Payer: Medicare PPO | Admitting: Nurse Practitioner

## 2022-04-15 VITALS — BP 128/78 | HR 80 | Temp 98.1°F | Ht 62.0 in | Wt 146.8 lb

## 2022-04-15 DIAGNOSIS — J449 Chronic obstructive pulmonary disease, unspecified: Secondary | ICD-10-CM

## 2022-04-15 DIAGNOSIS — N816 Rectocele: Secondary | ICD-10-CM

## 2022-04-15 DIAGNOSIS — I10 Essential (primary) hypertension: Secondary | ICD-10-CM | POA: Diagnosis not present

## 2022-04-15 DIAGNOSIS — Z6823 Body mass index (BMI) 23.0-23.9, adult: Secondary | ICD-10-CM

## 2022-04-15 DIAGNOSIS — R7309 Other abnormal glucose: Secondary | ICD-10-CM | POA: Diagnosis not present

## 2022-04-15 DIAGNOSIS — R6889 Other general symptoms and signs: Secondary | ICD-10-CM

## 2022-04-15 DIAGNOSIS — I25118 Atherosclerotic heart disease of native coronary artery with other forms of angina pectoris: Secondary | ICD-10-CM

## 2022-04-15 DIAGNOSIS — E782 Mixed hyperlipidemia: Secondary | ICD-10-CM | POA: Diagnosis not present

## 2022-04-15 DIAGNOSIS — R0602 Shortness of breath: Secondary | ICD-10-CM

## 2022-04-15 DIAGNOSIS — E559 Vitamin D deficiency, unspecified: Secondary | ICD-10-CM | POA: Diagnosis not present

## 2022-04-15 DIAGNOSIS — Z79899 Other long term (current) drug therapy: Secondary | ICD-10-CM

## 2022-04-15 DIAGNOSIS — K862 Cyst of pancreas: Secondary | ICD-10-CM

## 2022-04-15 DIAGNOSIS — I341 Nonrheumatic mitral (valve) prolapse: Secondary | ICD-10-CM | POA: Diagnosis not present

## 2022-04-15 DIAGNOSIS — I7300 Raynaud's syndrome without gangrene: Secondary | ICD-10-CM | POA: Diagnosis not present

## 2022-04-15 DIAGNOSIS — I700 Atherosclerosis of aorta: Secondary | ICD-10-CM

## 2022-04-15 DIAGNOSIS — M818 Other osteoporosis without current pathological fracture: Secondary | ICD-10-CM

## 2022-04-15 DIAGNOSIS — U099 Post covid-19 condition, unspecified: Secondary | ICD-10-CM

## 2022-04-15 DIAGNOSIS — Z0001 Encounter for general adult medical examination with abnormal findings: Secondary | ICD-10-CM | POA: Diagnosis not present

## 2022-04-15 DIAGNOSIS — N8110 Cystocele, unspecified: Secondary | ICD-10-CM

## 2022-04-15 DIAGNOSIS — K219 Gastro-esophageal reflux disease without esophagitis: Secondary | ICD-10-CM

## 2022-04-15 DIAGNOSIS — Z0000 Encounter for general adult medical examination without abnormal findings: Secondary | ICD-10-CM

## 2022-04-15 DIAGNOSIS — G4733 Obstructive sleep apnea (adult) (pediatric): Secondary | ICD-10-CM

## 2022-04-15 DIAGNOSIS — S22000D Wedge compression fracture of unspecified thoracic vertebra, subsequent encounter for fracture with routine healing: Secondary | ICD-10-CM

## 2022-04-15 NOTE — Patient Instructions (Signed)
Go to [facility] to complete your chest x-ray Location:  [place] Following a healthy eating pattern may help you to achieve and maintain a healthy body weight, reduce the risk of chronic disease, and live a long and productive life. It is important to follow a healthy eating pattern at an appropriate calorie level for your body. Your nutritional needs should be met primarily through food by choosing a variety of nutrient-rich foods. What are tips for following this plan? Reading food labels Read labels and choose the following: Reduced or low sodium. Juices with 100% fruit juice. Foods with low saturated fats and high polyunsaturated and monounsaturated fats. Foods with whole grains, such as whole wheat, cracked wheat, brown rice, and wild rice. Whole grains that are fortified with folic acid. This is recommended for women who are pregnant or who want to become pregnant. Read labels and avoid the following: Foods with a lot of added sugars. These include foods that contain brown sugar, corn sweetener, corn syrup, dextrose, fructose, glucose, high-fructose corn syrup, honey, invert sugar, lactose, malt syrup, maltose, molasses, raw sugar, sucrose, trehalose, or turbinado sugar. Do not eat more than the following amounts of added sugar per day: 6 teaspoons (25 g) for women. 9 teaspoons (38 g) for men. Foods that contain processed or refined starches and grains. Refined grain products, such as white flour, degermed cornmeal, white bread, and white rice. Shopping Choose nutrient-rich snacks, such as vegetables, whole fruits, and nuts. Avoid high-calorie and high-sugar snacks, such as potato chips, fruit snacks, and candy. Use oil-based dressings and spreads on foods instead of solid fats such as butter, stick margarine, or cream cheese. Limit pre-made sauces, mixes, and "instant" products such as flavored rice, instant noodles, and ready-made pasta. Try more  plant-protein sources, such as tofu, tempeh, black beans, edamame, lentils, nuts, and seeds. Explore eating plans such as the Mediterranean diet or vegetarian diet. Cooking Use oil to saut or stir-fry foods instead of solid fats such as butter, stick margarine, or lard. Try baking, boiling, grilling, or broiling instead of frying. Remove the fatty part of meats before cooking. Steam vegetables in water or broth. Meal planning  At meals, imagine dividing your plate into fourths: One-half of your plate is fruits and vegetables. One-fourth of your plate is whole grains. One-fourth of your plate is protein, especially lean meats, poultry, eggs, tofu, beans, or nuts. Include low-fat dairy as part of your daily diet. Lifestyle Choose healthy options in all settings, including home, work, school, restaurants, or stores. Prepare your food safely: Wash your hands after handling raw meats. Keep food preparation surfaces clean by regularly washing with hot, soapy water. Keep raw meats separate from ready-to-eat foods, such as fruits and vegetables. Cook seafood, meat, poultry, and eggs to the recommended internal temperature. Store foods at safe temperatures. In general: Keep cold foods at 45F (4.4C) or below. Keep hot foods at 145F (60C) or above. Keep your freezer at [facility] (-17.8C) or below. Foods are no longer safe to eat when they have been between the temperatures of 40-145F (4.4-60C) for more than 2 hours. What foods should I eat? Fruits Aim to eat 2 cup-equivalents of fresh, canned (in natural juice), or frozen fruits each day. Examples of 1 cup-equivalent of fruit include 1 small apple, 8 large strawberries, 1 cup canned fruit,  cup dried fruit, or 1 cup 100% juice. Vegetables Aim to eat 2-3 cup-equivalents of fresh and frozen vegetables each day, including different  varieties and colors. Examples of 1 cup-equivalent of vegetables include 2 medium carrots, 2 cups raw, leafy  greens, 1 cup chopped vegetable (raw or cooked), or 1 medium baked potato. Grains Aim to eat 6 ounce-equivalents of whole grains each day. Examples of 1 ounce-equivalent of grains include 1 slice of bread, 1 cup ready-to-eat cereal, 3 cups popcorn, or  cup cooked rice, pasta, or cereal. Meats and other proteins Aim to eat 5-6 ounce-equivalents of protein each day. Examples of 1 ounce-equivalent of protein include 1 egg, 1/2 cup nuts or seeds, or 1 tablespoon (16 g) peanut butter. A cut of meat or fish that is the size of a deck of cards is about 3-4 ounce-equivalents. Of the protein you eat each week, try to have at least 8 ounces come from seafood. This includes salmon, trout, herring, and anchovies. Dairy Aim to eat 3 cup-equivalents of fat-free or low-fat dairy each day. Examples of 1 cup-equivalent of dairy include 1 cup (240 mL) milk, 8 ounces (250 g) yogurt, 1 ounces (44 g) natural cheese, or 1 cup (240 mL) fortified soy milk. Fats and oils Aim for about 5 teaspoons (21 g) per day. Choose monounsaturated fats, such as canola and olive oils, avocados, peanut butter, and most nuts, or polyunsaturated fats, such as sunflower, corn, and soybean oils, walnuts, pine nuts, sesame seeds, sunflower seeds, and flaxseed. Beverages Aim for six 8-oz glasses of water per day. Limit coffee to three to five 8-oz cups per day. Limit caffeinated beverages that have added calories, such as soda and energy drinks. Limit alcohol intake to no more than 1 drink a day for nonpregnant women and 2 drinks a day for men. One drink equals 12 oz of beer (355 mL), 5 oz of wine (148 mL), or 1 oz of hard liquor (44 mL). Seasoning and other foods Avoid adding excess amounts of salt to your foods. Try flavoring foods with herbs and spices instead of salt. Avoid adding sugar to foods. Try using oil-based dressings, sauces, and spreads instead of solid fats. This information is based on general [place] nutrition guidelines. For  more information, visit [employer]es. Exact amounts may vary based on your nutrition needs. Summary A healthy eating plan may help you to maintain a healthy weight, reduce the risk of chronic diseases, and stay active throughout your life. Plan your meals. Make sure you eat the right portions of a variety of nutrient-rich foods. Try baking, boiling, grilling, or broiling instead of frying. Choose healthy options in all settings, including home, work, school, restaurants, or stores. This information is not intended to replace advice given to you by your health care provider. Make sure you discuss any questions you have with your health care provider. Document Revised: 07/29/2021 Document Reviewed: 10/14/2020 Elsevier Patient Education  [place].

## 2022-04-15 NOTE — Progress Notes (Signed)
MEDICARE ANNUAL WELLNESS VISIT AND FOLLOW UP  Assessment:   Diagnoses and all orders for this visit:  Encounter for Medicare annual wellness exam Due annually  Health maintenance reviewed  Atherosclerosis of aorta Per CT 2018 Control blood pressure, cholesterol, glucose, increase exercise.   Coronary artery disease involving native coronary artery of native heart with other form of angina pectoris ([place]) Control blood pressure, cholesterol, glucose, increase exercise.  Prescribed nitroglycerine without recent use Followed by cardiology  Essential hypertension Controlled - continue asa, diltiazem, furosemide, labetalol Discussed DASH (Dietary Approaches to Stop Hypertension) DASH diet is lower in sodium than a typical American diet. Cut back on foods that are high in saturated fat, cholesterol, and trans fats. Eat more whole-grain foods, fish, poultry, and nuts Remain active and exercise as tolerated daily.  Monitor BP at home-Call if greater than 130/80.  Check CMP/CBC  MVP (mitral valve prolapse) Continue BB, monitor.   Raynaud's disease without gangrene No complications, Monitor  Chronic obstructive pulmonary disease, unspecified COPD type ([place]) By imaging; denies notable symptoms of dyspnea, secretions, continue to monitor. Remote hx of smoking.   OSA on CPAP Reports typically wears 100% of the time and endorses restorative sleep  Gastroesophageal reflux disease, esophagitis presence not specified Continue Pantoprazole PRN. No suspected reflux complications ([place]/stricture). Lifestyle modification:  wt loss, avoid meals 2-3h before bedtime. Consider eliminating food triggers:  chocolate, caffeine, EtOH, acid/spicy food.   Abnormal glucose Education: Reviewed 'ABCs' of diabetes management  Discussed goals to be met and/or maintained include A1C (<7) Blood pressure (<130/80) Cholesterol (LDL <70) Continue Eye Exam yearly  Continue Dental Exam Q6  mo Discussed dietary recommendations Discussed Physical Activity recommendations Check A1C  Medication management All medications discussed and reviewed in full. All questions and concerns regarding medications addressed.     Mixed hyperlipidemia/aortic atherosclerosis Aortic atherosclerosis Abd CT scan 02/16/2020 Continue ezetimibe-simvastatin Discussed lifestyle modifications. Recommended diet heavy in fruits and veggies, omega 3's. Decrease consumption of animal meats, cheeses, and dairy products. Remain active and exercise as tolerated. Continue to monitor. Check lipids/TSH  Vitamin D deficiency At goal at recent check; continue to recommend supplementation for goal of 70-100 Monitor levels  Thoracic compression facture with non-union [facility]) Dr. Lorin Mercy following; see notes for ostepenia, DEXA Completed PT  Osteopenia of left forearm DEXA 2023, followed by Dr. 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 Sherrian Divers and doing well with pessary; discussed pelvic floor therapy; will follow up with uro/gyn  BMI 23 Discussed appropriate BMI Diet modification. Physical activity. Encouraged/praised to build confidence.  Post Covid Syndrome/SOB Use Ventolin PRN Updated CXR to assess for any underlying disease process Report to ER for any increase in SOB  Orders Placed This Encounter  Procedures   DG Chest 2 View    Standing Status:   Future    Standing Expiration Date:   04/16/2023    Order Specific Question:   Reason for Exam (SYMPTOM  OR DIAGNOSIS REQUIRED)    Answer:   Post Covid SOB 1/12/204    Order Specific Question:   Preferred imaging location?    Answer:   [facility]   CBC with Differential/Platelet   COMPLETE METABOLIC PANEL WITH GFR   Lipid panel   Hemoglobin A1c  VITAMIN D 25 Hydroxy (Vit-D Deficiency, Fractures)    Notify office for further evaluation and treatment, questions or concerns if any reported s/s fail to improve.   The patient was advised to call back or seek an in-person evaluation if any symptoms worsen or if the condition fails to improve as anticipated.   Further disposition pending results of labs. Discussed med's effects and SE's.    I discussed the assessment and treatment plan with the patient. The patient was provided an opportunity to ask questions and all were answered. The patient agreed with the plan and demonstrated an understanding of the instructions.  Discussed med's effects and SE's. Screening labs and tests as requested with regular follow-up as recommended.  I provided 35 minutes of face-to-face time during this encounter including counseling, chart review, and critical decision making was preformed.   Future Appointments  Date Time Provider [place]  04/16/2022  2:10 PM GI-BCG DIAG TOMO 2 GI-BCGMM GI-BREAST CE  04/16/2022  2:20 PM GI-BCG [place] 2 GI-BCGUS GI-BREAST CE  05/19/2022  2:20 PM Jasmine Sine, MD [facility] None  06/15/2022  2:30 PM Jasmine Pinto, MD GAAM-[facility] None  08/13/2022  2:30 PM Marybelle Killings, MD [facility] None  12/08/2022  2:00 PM Jasmine Pinto, MD [facility] None  06/08/2023 11:00 AM Alycia Rossetti, NP GAAM-[facility] None     Plan:   During the course of the visit the patient was educated and counseled about appropriate screening and preventive services including:   Pneumococcal vaccine  Prevnar 13 Influenza vaccine Td vaccine Screening electrocardiogram Bone densitometry screening Colorectal cancer screening Diabetes screening Glaucoma screening Nutrition counseling  Advanced directives: requested   Subjective:  Jasmine Reeves is a 82 y.o. female who  presents for Medicare Annual Wellness Visit and 3 month follow up.    She was dx with Covid 03/12/22.  Has noticed some difficulty breathing and SOB with DOE.  She reports following with a Pulmonologist 20 years ago who stated she had a "touch of COPD" but not enough for a diagnosis.  She was a former smoker in her teen years but for approximately 2 years.  She has not smoked since.  She has not been using SABA inhaler prescribed during Covid.   Follows annually with Dr. Nori Riis for mammograms and PAPs. Had known cystocele/rectocele, recently underwent surgical repair 03/12/2019 but unfortunately with repeated prolapse, was referred for second opinion to [facility] Dr. Francia Greaves, was fitted for pessary. Is not currently using pessary, had some issues with fit of pessary- she are continues to follow with Dr. Zigmund Daniel.  She had recent mammogram 01/2022 that showed suspicious nodule.  She is scheduled for an [place] tomorrow 04/16/21.  Of note, had same diagnosis last year with same procedural follow up and outcome was benign.    The Abdominal CT in Dec 2021 also  showed 2 cysts in the pancreatic tail, patient was seen by Dr. Rolla Etienne, [facility] surgical oncologist and had an Abd MRI which showed 1 cyst - 1.6 cm. and recommendations were to "Reimage q93mox 4, then q1y x 2, then q2y x 3 -STOP if stable over 10 years ".  Patient had compression fracture T12 in 01/2020, has seen Dr. YLorin Mercy recent follow up 07/2020 showed non-union. She had follow up DEXA 04/2021 which shows osteopenia. Last visit with Dr. YLorin Mercywas 08/11/21- to complete full year of Fosamax and recheck DEXA 2025  she has a diagnosis of GERD which is currently managed by protonix  40 mg BID, carafate PRN due to breathrough, has followed up with Dr. Cristina Gong. Last colonoscopy was 2014, was to have repeat in 2019 but did not have done due to Covid restrictions that occurred.  BMI is Body mass index is 26.85 kg/m., she has been working on diet, not  exercising as much- Dr. Lorin Mercy wants her to walk 2 miles a day Wt Readings from Last 3 Encounters:  04/15/22 146 lb 12.8 oz (66.6 kg)  12/03/21 148 lb 9.6 oz (67.4 kg)  08/25/21 147 lb (66.7 kg)   She has hx of PCA x 2 In Nov & Dec 2001, and CABG in Jan 2002. , she underwent CABG. Had reassuring myocardial perfusion and ECHO in 12/2018 other than known MVP and mild aortic stenosis Continues to follow up with Dr. Claiborne Billings.  She has aortic atherosclerosis and COPD per CXR in 2018/2019  Her blood pressure has been controlled at home, today their BP is BP: 128/78 She does not workout. She denies chest pain, shortness of breath, dizziness.   She is on cholesterol medication (vytorin 10/20) and denies myalgias. Her cholesterol is at goal. The cholesterol last visit was:   Lab Results  Component Value Date   CHOL 148 12/03/2021   HDL 65 12/03/2021   LDLCALC 63 12/03/2021   TRIG 115 12/03/2021   CHOLHDL 2.3 12/03/2021    She has been working on diet and exercise for glucose management, and denies increased appetite, nausea, paresthesia of the feet, polydipsia, polyuria, visual disturbances, vomiting and weight loss. Last A1C in the office was:  Lab Results  Component Value Date   HGBA1C 5.7 (H) 12/03/2021   Last GFR demonstrates stage 2 ckd: Lab Results  Component Value Date   [facility] 76 09/02/2020   Patient is on Vitamin D supplement and at goal at most recent check:  Lab Results  Component Value Date   VD25OH 71 12/03/2021      Medication Review: Current Outpatient Medications on File Prior to Visit  Medication Sig Dispense Refill   albuterol (VENTOLIN HFA) 108 (90 Base) MCG/ACT inhaler Inhale 2 puffs into the lungs every 6 (six) hours as needed for wheezing or shortness of breath. 8 g 2   alendronate (FOSAMAX) 70 MG tablet Take 1 tablet (70 mg total) by mouth once a week. Take with a full glass of water on an empty stomach. 12 tablet 4   aspirin 81 MG tablet Take 81 mg by mouth  daily.     CALCIUM CITRATE PO Take 500 mg by mouth daily.     cetirizine (ZYRTEC) 10 MG tablet Take 10 mg by mouth daily.     Cholecalciferol (VITAMIN D) 2000 UNITS tablet Take 2,000 Units by mouth 2 (two) times daily.     diltiazem (DILACOR XR) 180 MG 24 hr capsule TAKE 1 CAPSULE BY MOUTH DAILY 90 capsule 3   docusate sodium (COLACE) 100 MG capsule Take 1 capsule (100 mg total) by mouth every 12 (twelve) hours. 60 capsule 0   estradiol (ESTRACE) 0.1 MG/GM vaginal cream estradiol 0.01% (0.1 mg/gram) vaginal cream  INSERT 2 GRAMS INTO THE VAGINA DAILY FOR FIRST WEEK THEN TWICE WEEKLY AFTER FIRST WEEK     ezetimibe-simvastatin (VYTORIN) 10-20 MG tablet TAKE 1 TABLET BY MOUTH DAILY FOR CHOLESTEROL 90 tablet 1   fexofenadine (ALLEGRA) 180 MG tablet Take 180 mg by mouth daily.     fluticasone (VERAMYST) 27.5 MCG/SPRAY nasal spray Place 2 sprays into the nose daily as needed for rhinitis.  10 g 8   Fluticasone Propionate (FLONASE NA) Place into the nose.     furosemide (LASIX) 20 MG tablet Take 1 tablet Daily as needed for Fluid Retention  and Ankle / Leg Swelling 90 tablet 3   IRON PO Take 50 mg by mouth daily.     labetalol (NORMODYNE) 100 MG tablet TAKE 1 TABLET(100 MG) BY MOUTH AT BEDTIME 90 tablet 3   Magnesium 400 MG CAPS Take by mouth daily.     methocarbamol (ROBAXIN) 500 MG tablet Take 1 tablet (500 mg total) by mouth every 8 (eight) hours as needed. For muscle spasms. Do not take and drive. 60 tablet 0   Multiple Vitamins-Minerals (MULTIVITAMIN WITH MINERALS) tablet Take 1 tablet by mouth daily.     nitroGLYCERIN (NITROSTAT) 0.4 MG SL tablet Place 1 tablet (0.4 mg total) under the tongue every 5 (five) minutes as needed for chest pain. 25 tablet 12   pantoprazole (PROTONIX) 40 MG tablet Take  1 tablet  2 x /day (every 12 hours ) to Prevent Heartburn & Indigestion 180 tablet 3   predniSONE (DELTASONE) 10 MG tablet 1 tab 3 x day for 2 days, then 1 tab 2 x day for 2 days, then 1 tab 1 x day for 3  days 13 tablet 0   promethazine-dextromethorphan (PROMETHAZINE-DM) 6.25-15 MG/5ML syrup Take 5 mLs by mouth 4 (four) times daily as needed for cough. 240 mL 0   vitamin C (ASCORBIC ACID) 500 MG tablet Take 500 mg by mouth daily.     Zinc 50 MG TABS Take by mouth daily.     No current facility-administered medications on file prior to visit.    Allergies  Allergen Reactions   Altace [Ramipril]     cough   Azithromycin     palpitation   Celebrex [Celecoxib]     Gi upset    Hydrochlorothiazide     cramping   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   Prilosec [Omeprazole]     constipation   Tape Other (See Comments)   Tapentadol     Other reaction(s): Other (See Comments), redness & itch    Current Problems (verified) Patient Active Problem List   Diagnosis Date Noted   Aortic atherosclerosis ([place]) by Abd CT scan on 02/16/2020 05/11/2020   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   Compression fracture of T12 vertebra ([place]) 02/21/2020   Cystocele with rectocele 12/13/2018   BMI 23.0-23.9, adult 11/01/2017   Osteoporosis with current pathological fracture 04/12/2017   OSA on CPAP 11/27/2014   IBS (irritable bowel syndrome) 11/11/2014   Encounter for Medicare annual wellness exam 11/11/2014   Medication management 04/12/2014   Abnormal blood sugar 10/10/2013   Mixed hyperlipidemia 10/08/2013   CAD (coronary artery disease) 02/03/2013   MVP (mitral valve prolapse) 02/03/2013   Vitamin D deficiency    GERD (gastroesophageal reflux disease)    COPD (chronic obstructive pulmonary disease) ([place]) 02/11/2010   Essential hypertension 01/28/2010   RAYNAUDS SYNDROME 01/28/2010    Screening Tests Immunization History  Administered Date(s) Administered   Fluad Quad(high Dose 65+) 11/16/2021   Influenza Inj Mdck Quad Pf 11/29/2021   Influenza, High Dose Seasonal PF 11/11/2014, 12/08/2015   Influenza,inj,Quad PF,6+ Mos 11/27/2016, 11/20/2017, 11/14/2018, 11/11/2020    Influenza-Unspecified 11/20/2019   PFIZER(Purple Top)SARS-COV-2 Vaccination 04/03/2019, 04/26/2019   Pneumococcal Conjugate-13 04/16/2014   Pneumococcal-Unspecified 03/01/1998, 03/12/2010   Td 03/27/2012   Tdap 02/10/2018   Zoster, Live 03/02/2007   Health  Maintenance  Topic Date Due   Zoster Vaccines- Shingrix (1 of 2) Never done   COVID-19 Vaccine (3 - Pfizer risk series) 05/24/2019   OPHTHALMOLOGY EXAM  04/01/2022   Pneumonia Vaccine 83+ Years old (2 of 2 - PPSV23 or PCV20) 08/26/2022 (Originally 04/17/2015)   COLONOSCOPY (Pts 45-9yr Insurance coverage will need to be confirmed)  08/26/2022 (Originally 01/03/2020)   Hepatitis C Screening  08/26/2022 (Originally 10/09/1958)   HEMOGLOBIN A1C  06/04/2022   Diabetic kidney evaluation - eGFR measurement  12/04/2022   Diabetic kidney evaluation - Urine ACR  12/04/2022   Medicare Annual Wellness (AWV)  04/16/2023   DTaP/Tdap/Td (3 - Td or Tdap) 02/11/2028   INFLUENZA VACCINE  Completed   DEXA SCAN  Completed   HPV VACCINES  Aged Out   FOOT EXAM  Discontinued   Mammogram:  01/2022 - needs [place]- scheduled 04/16/22 DEXA 04/2021 T Score -2.3 Due 2025   Names of Other Physician/Practitioners you currently use: 1. [facility] here for primary care 2. Dr. GKaty Fitch eye doctor, last visit 2023 3. [place], dentist, last visit 04/2021, goes q 6 months 4. Dr. MLeonie Douglas derm, last 2023, annually   Patient Care Team: MUnk Pinto MD as PCP - General (Internal Medicine) KTroy Sine MD as PCP - Cardiology (Cardiology) KTroy Sine MD as Consulting Physician (Cardiology) BRonald Lobo MD as Consulting Physician (Gastroenterology) NMaisie Fus MD (Inactive) as Consulting Physician (Obstetrics and Gynecology)  SURGICAL HISTORY She  has a past surgical history that includes Tonsillectomy; Appendectomy; Abdominal hysterectomy; Tubal ligation; Coronary artery bypass graft; Coronary angioplasty  (12/11); Esophagogastroduodenoscopy (07/21/12); and Cystocele repair (03/12/2019). FAMILY HISTORY Her family history includes Heart disease in her brother; Hyperlipidemia in her brother. SOCIAL HISTORY She  reports that she quit smoking about 56 years ago. Her smoking use included cigarettes. She has never used smokeless tobacco. She reports current alcohol use of about 3.0 standard drinks of alcohol per week. She reports that she does not use drugs.   MEDICARE WELLNESS OBJECTIVES: Physical activity: Exercise limited by: cardiac condition(s);orthopedic condition(s);respiratory conditions(s) Cardiac risk factors: Cardiac Risk Factors include: advanced age (>518m, >6>20omen);hypertension;obesity (BMI >30kg/m2) Depression/mood screen:      04/15/2022    3:54 PM  Depression screen PHQ 2/9  Decreased Interest 0  Down, Depressed, Hopeless 0  PHQ - 2 Score 0    ADLs:     04/15/2022    3:53 PM 12/05/2021   10:05 PM  In your present state of health, do you have any difficulty performing the following activities:  Hearing? 0 0  Vision? 0 0  Difficulty concentrating or making decisions? 0 0  Walking or climbing stairs? 0 0  Dressing or bathing? 0 0  Doing errands, shopping? 0 0  Preparing Food and eating ? Jasmine   Using the Toilet? Jasmine   In the past six months, have you accidently leaked urine? N   Do you have problems with loss of bowel control? Jasmine   Managing your Medications? Jasmine   Managing your Finances? Jasmine   Housekeeping or managing your Housekeeping? Jasmine      Cognitive Testing  Alert? Yes  Normal Appearance?Yes  Oriented to person? Yes  Place? Yes   Time? Yes  Recall of three objects?  Yes  Can perform simple calculations? Yes  Displays appropriate judgment?Yes  Can read the correct time from a watch face?Yes  EOL planning: Does Patient Have a Medical Advance Directive?: No  Review  of Systems  Constitutional:  Negative for chills, fever, malaise/fatigue and weight loss.  HENT:   Positive for sore throat. Negative for congestion, hearing loss and tinnitus.   Eyes:  Negative for blurred vision and double vision.  Respiratory:  Negative for cough, sputum production, shortness of breath and wheezing.   Cardiovascular:  Negative for chest pain, palpitations, orthopnea, claudication and leg swelling.  Gastrointestinal:  Negative for abdominal pain, blood in stool, constipation, diarrhea, heartburn, melena, nausea and vomiting.  Genitourinary:  Negative for dysuria, frequency (improved), hematuria and urgency.       Perineal discomfort following cystocele surgery  Musculoskeletal:  Positive for back pain (chronic, mid back, compression fracture). Negative for falls, joint pain and myalgias.  Skin:  Negative for rash.  Neurological:  Negative for dizziness, tingling, sensory change, weakness and headaches.  Endo/Heme/Allergies:  Negative for environmental allergies and polydipsia.  Psychiatric/Behavioral: Negative.  Negative for depression, memory loss and substance abuse. The patient is not nervous/anxious and does not have insomnia.   All other systems reviewed and are negative.    Objective:     Today's Vitals   04/15/22 1503  BP: 128/78  Pulse: 80  Temp: 98.1 F (36.7 C)  SpO2: 98%  Weight: 146 lb 12.8 oz (66.6 kg)  Height: 5' 2"$  (1.575 m)   Body mass index is 26.85 kg/m.  General appearance: alert, no distress, WD/WN, female HEENT: normocephalic, sclerae anicteric, TMs pearly, nares patent, no discharge or erythema, pharynx normal Oral cavity: MMM, no lesions Neck: supple, no lymphadenopathy, no thyromegaly, no masses Heart: RRR, normal S1, S2, no murmurs Lungs: CTA bilaterally, no wheezes, rhonchi, or rales Abdomen: +bs, soft, rounded, non tender, no masses, no hepatomegaly, no splenomegaly Musculoskeletal: nontender, no swelling, mild kyphoscoliosis Extremities: no edema, no cyanosis, no clubbing Pulses: 2+ symmetric, upper and lower extremities,  normal cap refill Neurological: alert, oriented x 3, CN2-12 intact, strength normal upper extremities and lower extremities, sensation normal throughout, DTRs 2+ throughout, no cerebellar signs, gait normal Psychiatric: normal affect, behavior normal, pleasant   Medicare Attestation I have personally reviewed: The patient's medical and social history Their use of alcohol, tobacco or illicit drugs Their current medications and supplements The patient's functional ability including ADLs,fall risks, home safety risks, cognitive, and hearing and visual impairment Diet and physical activities Evidence for depression or mood disorders  The patient's weight, height, BMI, and visual acuity have been recorded in the chart.  I have made referrals, counseling, and provided education to the patient based on review of the above and I have provided the patient with a written personalized care plan for preventive services.     Darrol Jump, NP   04/15/2022

## 2022-04-16 ENCOUNTER — Ambulatory Visit
Admission: RE | Admit: 2022-04-16 | Discharge: 2022-04-16 | Disposition: A | Discharge status: Home / Self Care | Payer: Medicare PPO | Source: Ambulatory Visit | Attending: Internal Medicine | Admitting: Internal Medicine

## 2022-04-16 ENCOUNTER — Other Ambulatory Visit: Payer: Self-pay | Admitting: Internal Medicine

## 2022-04-16 DIAGNOSIS — R928 Other abnormal and inconclusive findings on diagnostic imaging of breast: Secondary | ICD-10-CM | POA: Diagnosis not present

## 2022-04-16 DIAGNOSIS — N6002 Solitary cyst of left breast: Secondary | ICD-10-CM | POA: Diagnosis not present

## 2022-04-16 DIAGNOSIS — N6320 Unspecified lump in the left breast, unspecified quadrant: Secondary | ICD-10-CM

## 2022-04-16 LAB — LIPID PANEL
Cholesterol: 159 mg/dL (ref <200)
HDL: 59 mg/dL (ref >=50)
LDL Cholesterol (Calc): 64 mg/dL (calc)
Non-HDL Cholesterol (Calc): 100 mg/dL (calc) (ref <130)
Total CHOL/HDL Ratio: 2.7 (calc) (ref <5.0)
Triglycerides: 270 mg/dL — ABNORMAL HIGH (ref <150)

## 2022-04-16 LAB — HEMOGLOBIN A1C
Hgb A1c MFr Bld: 6.2 % of total Hgb — ABNORMAL HIGH (ref <5.7)
Mean Plasma Glucose: 131 mg/dL
eAG (mmol/L): 7.3 mmol/L

## 2022-04-16 LAB — CBC WITH DIFFERENTIAL/PLATELET
Absolute Monocytes: 598 cells/uL (ref 200–950)
Basophils Absolute: 52 cells/uL (ref 0–200)
Basophils Relative: 0.8 %
Eosinophils Absolute: 130 cells/uL (ref 15–500)
Eosinophils Relative: 2 %
HCT: 38 % (ref 35.0–45.0)
Hemoglobin: 12.9 g/dL (ref 11.7–15.5)
Lymphs Abs: 1638 cells/uL (ref 850–3900)
MCH: 31.2 pg (ref 27.0–33.0)
MCHC: 33.9 g/dL (ref 32.0–36.0)
MCV: 91.8 fL (ref 80.0–100.0)
MPV: 10.1 fL (ref 7.5–12.5)
Monocytes Relative: 9.2 %
Neutro Abs: 4082 cells/uL (ref 1500–7800)
Neutrophils Relative %: 62.8 %
Platelets: 242 10*3/uL (ref 140–400)
RBC: 4.14 10*6/uL (ref 3.80–5.10)
RDW: 12.2 % (ref 11.0–15.0)
Total Lymphocyte: 25.2 %
WBC: 6.5 10*3/uL (ref 3.8–10.8)

## 2022-04-16 LAB — COMPLETE METABOLIC PANEL WITH GFR
AG Ratio: 2 (calc) (ref 1.0–2.5)
ALT: 20 U/L (ref 6–29)
AST: 20 U/L (ref 10–35)
Albumin: 4.3 g/dL (ref 3.6–5.1)
Alkaline phosphatase (APISO): 66 U/L (ref 37–153)
BUN: 16 mg/dL (ref 7–25)
CO2: 24 mmol/L (ref 20–32)
Calcium: 9.2 mg/dL (ref 8.6–10.4)
Chloride: 106 mmol/L (ref 98–110)
Creat: 0.73 mg/dL (ref 0.60–1.00)
Globulin: 2.2 g/dL (calc) (ref 1.9–3.7)
Glucose, Bld: 133 mg/dL — ABNORMAL HIGH (ref 65–99)
Potassium: 3.9 mmol/L (ref 3.5–5.3)
Sodium: 142 mmol/L (ref 135–146)
Total Bilirubin: 0.4 mg/dL (ref 0.2–1.2)
Total Protein: 6.5 g/dL (ref 6.1–8.1)
eGFR: 84 mL/min/{1.73_m2} (ref >=60)

## 2022-04-16 LAB — VITAMIN D 25 HYDROXY (VIT D DEFICIENCY, FRACTURES): Vit D, 25-Hydroxy: 71 ng/mL (ref 30–100)

## 2022-04-19 ENCOUNTER — Ambulatory Visit
Admission: RE | Admit: 2022-04-19 | Discharge: 2022-04-19 | Disposition: A | Discharge status: Home / Self Care | Payer: Medicare PPO | Source: Ambulatory Visit | Attending: Nurse Practitioner | Admitting: Nurse Practitioner

## 2022-04-19 DIAGNOSIS — U099 Post covid-19 condition, unspecified: Secondary | ICD-10-CM

## 2022-04-19 DIAGNOSIS — R0602 Shortness of breath: Secondary | ICD-10-CM

## 2022-04-20 DIAGNOSIS — R159 Full incontinence of feces: Secondary | ICD-10-CM | POA: Diagnosis not present

## 2022-04-20 DIAGNOSIS — Z86010 Personal history of colonic polyps: Secondary | ICD-10-CM | POA: Diagnosis not present

## 2022-04-21 ENCOUNTER — Encounter: Payer: Self-pay | Admitting: Nurse Practitioner

## 2022-04-22 ENCOUNTER — Other Ambulatory Visit: Payer: Self-pay | Admitting: Nurse Practitioner

## 2022-04-22 MED ORDER — DOXYCYCLINE HYCLATE 100 MG PO TABS: 100.0000 mg | ORAL_TABLET | Freq: Two times a day (BID) | ORAL | 0 refills | Status: AC

## 2022-04-23 ENCOUNTER — Ambulatory Visit
Admission: RE | Admit: 2022-04-23 | Discharge: 2022-04-23 | Disposition: A | Discharge status: Home / Self Care | Payer: Medicare PPO | Source: Ambulatory Visit | Attending: Internal Medicine | Admitting: Internal Medicine

## 2022-04-23 DIAGNOSIS — N6320 Unspecified lump in the left breast, unspecified quadrant: Secondary | ICD-10-CM

## 2022-04-23 DIAGNOSIS — N6321 Unspecified lump in the left breast, upper outer quadrant: Secondary | ICD-10-CM | POA: Diagnosis not present

## 2022-04-23 DIAGNOSIS — N6082 Other benign mammary dysplasias of left breast: Secondary | ICD-10-CM | POA: Diagnosis not present

## 2022-04-23 HISTORY — PX: BREAST BIOPSY: SHX20

## 2022-04-29 ENCOUNTER — Telehealth: Payer: Self-pay | Admitting: Orthopaedic Surgery

## 2022-04-29 NOTE — Telephone Encounter (Signed)
Called pt and left vm for pt to call and reschedule appt with dr Lorin Mercy on 6/14. Lorin Mercy will not be in office

## 2022-05-10 ENCOUNTER — Encounter: Payer: Self-pay | Admitting: Internal Medicine

## 2022-05-12 DIAGNOSIS — G4733 Obstructive sleep apnea (adult) (pediatric): Secondary | ICD-10-CM | POA: Diagnosis not present

## 2022-05-19 ENCOUNTER — Ambulatory Visit: Payer: Medicare PPO | Attending: Cardiovascular Disease | Admitting: Cardiovascular Disease

## 2022-05-19 VITALS — BP 126/70 | HR 71 | Ht 62.0 in | Wt 147.8 lb

## 2022-05-19 DIAGNOSIS — I7300 Raynaud's syndrome without gangrene: Secondary | ICD-10-CM

## 2022-05-19 DIAGNOSIS — I350 Nonrheumatic aortic (valve) stenosis: Secondary | ICD-10-CM

## 2022-05-19 DIAGNOSIS — E785 Hyperlipidemia, unspecified: Secondary | ICD-10-CM

## 2022-05-19 DIAGNOSIS — G4733 Obstructive sleep apnea (adult) (pediatric): Secondary | ICD-10-CM | POA: Diagnosis not present

## 2022-05-19 DIAGNOSIS — I2510 Atherosclerotic heart disease of native coronary artery without angina pectoris: Secondary | ICD-10-CM

## 2022-05-19 DIAGNOSIS — Z951 Presence of aortocoronary bypass graft: Secondary | ICD-10-CM

## 2022-05-19 NOTE — Patient Instructions (Signed)
Medication Instructions:  Your physician recommends that you continue on your current medications as directed. Please refer to the Current Medication list given to you today.  *If you need a refill on your cardiac medications before your next appointment, please call your pharmacy*  Follow-Up: At [facility], you and your health needs are our priority.  As part of our continuing mission to provide you with exceptional heart care, we have created designated Provider Care Teams.  These Care Teams include your primary Cardiologist (physician) and Advanced Practice Providers (APPs -  Physician Assistants and Nurse Practitioners) who all work together to provide you with the care you need, when you need it.  We recommend signing up for the patient portal called "MyChart".  Sign up information is provided on this After Visit Summary.  MyChart is used to connect with patients for Virtual Visits (Telemedicine).  Patients are able to view lab/test results, encounter notes, upcoming appointments, etc.  Non-urgent messages can be sent to your provider as well.   To learn more about what you can do with MyChart, go to [email or web address].    Your next appointment:   12 month(s)  Provider:   Thomas Kelly, MD      

## 2022-05-19 NOTE — Progress Notes (Signed)
Patient ID: Jasmine Reeves, female   DOB: 31-Aug-1940, 82 y.o.   MRN: [id number]        HPI: Jasmine Reeves is a 82 y.o. female presents for an 36 month follow cardiology evaluation.  Jasmine Reeves underwent CABG revascularization surgery x4 on 03/02/2000 by Dr. Roxy Manns with a LIMA placed her distal LAD, vein to the second diagonal, sequential vein to 2 marginal vessels arising from the circumflex coronary artery. Her RCA was free of disease and was not bypassed. Additional problems include mitral valve prolapse, Raynaud's disease, history of short bursts of nonsustained tachycardia on treadmill testing with reference to this on beta blocker therapy.,. She did develop edema remotely on amlodipine. She hasa history of documented APCs on Holter imaging which have improved with labetalol.  She underwent a nuclear perfusion study on 09/07/2013.  She was noted to have ST segment changes  on ECG but nuclear imaging showed apical thinning with breast attenuation, without ischemia.  She had normal wall motion.  She denies any awareness of palpitations.  She does have issues with some arthritic symptoms.  She takes diltiazem 180 mg in addition to losartan 25 mg, Normodyne 150 mg twice a day and HCTZ 25 mg.  She is on Vytorin 10/20 for hyperlipidemia.  She tells me she was referred for sleep study which was done in [place] after a home study was abnormal.  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 Epworth sleepiness scale score was increased at 16.  CPAP was recommended but this has not yet been initiated.  The patient was not aware that I also am involved in sleep medicine. I reviewed with her current Medicare guidelines.  Lab work by her primary physician in 2016 revealed fasting glucose of 110.  She had normal renal function.  LFTs were normal.  Lipid studies were excellent with a total cholesterol 145,  triglycerides 57, HDL 62, and LDL 72.  Hemoglobin A1c was minimally increased at 5.8.  TSH was 3.029.  When I  saw her in September 2018, she denied any chest pain.  She had experienced a presyncopal spell on a very hot day and her dose of Cardizem and losartan were reduced.  She denies any recurrent palpitations.   Her losartan and labetalol were reduced.  Presently, she is without chest pain.  She denies shortness of breath.  Her blood pressure has tended to be low.  She stopped taking hydrochlorothiazide and is now taking diltiazem 180 mg which is also helpful for her a ray nods phenomena and has only been taking labetalol 100 mg at bedtime and losartan 12.5 mg  in the morning.  She is on CPAP therapy for his obstructive sleep apnea which was diagnosed after a home sleep study in [place].  [employer] is her DME company.  Repeat laboratory by Dr. Melford Aase had shown a total cholesterol 145, HDL 57, triglycerides were increased at 215.  LDL was 60.  I saw her in April 2019.  At that time she was remaining stable.  She not had any symptoms secondary to her Raynaud's phenomena treated with diltiazem.  Her blood pressure was low there was a 10 mm drop in blood pressure from supine to standing and I recommended discontinuance of losartan.  Over the past year and a half, she has done fairly well.  However she has had issues with degenerative scoliosis.  I last saw her on January 15, 2019 at  which time she stated that she had experienced episodes of chest comfort which seem to occur at rest and not with exertion.   She was wondering if these occurred after eating certain foods.  She has noticed some mild ankle swelling.  She has begun to notice more shortness of breath with activity more easily but admits that she has not been as active as she had been doing this Covid pandemic.  She is in need for prolapsed bladder surgery which tentatively is scheduled in on March 12, 2019. She apparently has never seen a  sleep specialist for her sleep apnea and has not been sleeping as well.  She is in need for new supplies and not been using her CPAP recently and would like to reinstitute treatment.  Her DME company is [employer].    Since it had been almost 19 years since her CABG revascularization  I recommended that she undergo a preoperative echo Doppler study and nuclear perfusion study.  The echo Doppler study January 29, 2019 showed normal EF at 60 to 65%.  There was mild aortic stenosis with a mean gradient of 10, and valve area of 1.5 cm.  A nuclear perfusion study on February 05, 2019 was normal without scar or ischemia or ECG changes.  Recent laboratory on December 3 had shown a total cholesterol 152, HDL 64, LDL 74 and triglycerides 59.  I saw her in follow-up of the above studies in December 2020.  I reviewed her preoperative echo Doppler and Lexiscan study prior to her planned bladder surgery for cystocele which was scheduled for January 2021.  I saw her in March 04, 2020.  At that time she continued to do well from a cardiac standpoint.   Her husband has developed progression of his Alzheimer's disease.  They have recently moved to [facility] independent living.  When she was caring for her husband she apparently sustained a 30% fracture at T12 and went to the emergency room in December 2021.  At that time she also was experiencing hemorrhoidal bleeding.  She subsequently saw Liane Comber, NP at Dr. Idell Pickles office on February 25, 2020.  Presently she denies any chest pain.  She is to have an MRI.  She has been wearing a back brace.  She denies any Raynaud's symptomatology.  Her blood pressure was well controlled on diltiazem long-acting 180 mg, labetalol 100 mg at bedtime.  She continues to be on Vytorin 10/20 for hyperlipidemia and LDL cholesterol was 65.  I saw her on March 23, 2021.  Since her prior evaluation she had continued to do well.  She was experiencing some vague nonexertional chest pain  symptomatology.   Oftentimes she notices some right greater than left shoulder issues.  She has noticed some mild shortness of breath.  She has had back issues with compression fracture and scoliosis.  She was using CPAP therapy and has an old ResMed air sense 10 AutoSet unit for her from 2016 which is no longer wireless.  She is meeting compliance standards with average use approximately 6 hours per night.  At 8 cm water pressure AHI is excellent at 1.3 on her most recent download from December 25, 122 through March 23, 2021.  During that evaluation, I recommended she undergo a follow-up echo Doppler study as well as nuclear perfusion study.  On May 11, 2021 an echo Doppler study showed normal LV function with EF 55 to 60%.  There was mild aortic stenosis with a mean gradient of 7  and peak gradient of 13.4.  There was no significant change from her prior evaluation.  A Lexiscan Myoview study also done on May 11, 2021 was low risk and showed normal perfusion.  Post-rest EF was 71%.   I last saw her on June 17, 2021 at which time she felt well.  She denied any recurrent chest pain or shortness of breath.   She and her husband are now living in independent living.  Her husband's Parkinson's disease is progressing.  She brought her CPAP machine with her today.  A download continues to show excellent compliance.  However usage is only 6 hours and 12 minutes.  At 8 cm set pressure AHI is excellent at 1.8.  During that evaluation, with her CPAP machine being over 65 years old and was no longer wireless she qualified for a new machine and an order was placed for replacement device.  She received a new ResMed AirSense 11 AutoSet unit on January 11, 2022 with [employer] as her DME company.  Download today shows excellent compliance and she has been averaging 6 hours and 53 minutes of use per night.  At a pressure range of 8 to 12 cm of water, AHI was excellent at 0.7 with her 95th percentile pressure at 9.8 and  maximum average pressure 10.5.  Typically she goes to bed at midnight.  I discussed optimal sleep duration at least 7 to 9 hours if at all possible.  She had developed COVID in January 2024.  She denies chest pain or shortness of breath.  She continues to be on diltiazem 180 mg daily in addition to labetalol 100 mg at bedtime for blood pressure and heart rhythm.  She is scheduled to undergo colonoscopy over the next several weeks.  She presents for evaluation.  Past Medical History:  Diagnosis Date   COPD (chronic obstructive pulmonary disease) (HCC)    Fibromyalgia    GERD (gastroesophageal reflux disease)    IBS (irritable bowel syndrome)    Scoliosis     Past Surgical History:  Procedure Laterality Date   ABDOMINAL HYSTERECTOMY     APPENDECTOMY     BREAST BIOPSY Left 04/23/2022   [place] LT BREAST BX W LOC DEV 1ST LESION IMG BX SPEC US GUIDE 04/23/2022 [facility]   CORONARY ANGIOPLASTY  12/11   CORONARY ARTERY BYPASS GRAFT     CYSTOCELE REPAIR  03/12/2019   cystocele and rectocele repair, Dr. Nori Riis   ESOPHAGOGASTRODUODENOSCOPY  07/21/12   polyps, Hiatal hernia Dr. Cristina Gong   TONSILLECTOMY     TUBAL LIGATION      Allergies  Allergen Reactions   Altace [Ramipril]     cough   Azithromycin     palpitation   Celebrex [Celecoxib]     Gi upset    Hydrochlorothiazide     cramping   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   Prilosec [Omeprazole]     constipation   Tape Other (See Comments)   Tapentadol     Other reaction(s): Other (See Comments), redness & itch    Current Outpatient Medications  Medication Sig Dispense Refill   albuterol (VENTOLIN HFA) 108 (90 Base) MCG/ACT inhaler Inhale 2 puffs into the lungs every 6 (six) hours as needed for wheezing or shortness of breath. 8 g 2   alendronate (FOSAMAX) 70 MG tablet Take 1 tablet (70 mg total) by mouth once a week. Take with a full glass of water on an empty stomach. 12  tablet 4   aspirin 81 MG  tablet Take 81 mg by mouth daily.     CALCIUM CITRATE PO Take 500 mg by mouth daily.     cetirizine (ZYRTEC) 10 MG tablet Take 10 mg by mouth daily.     Cholecalciferol (VITAMIN D) 2000 UNITS tablet Take 2,000 Units by mouth 2 (two) times daily.     diltiazem (DILACOR XR) 180 MG 24 hr capsule TAKE 1 CAPSULE BY MOUTH DAILY 90 capsule 3   docusate sodium (COLACE) 100 MG capsule Take 1 capsule (100 mg total) by mouth every 12 (twelve) hours. 60 capsule 0   estradiol (ESTRACE) 0.1 MG/GM vaginal cream estradiol 0.01% (0.1 mg/gram) vaginal cream  INSERT 2 GRAMS INTO THE VAGINA DAILY FOR FIRST WEEK THEN TWICE WEEKLY AFTER FIRST WEEK     ezetimibe-simvastatin (VYTORIN) 10-20 MG tablet TAKE 1 TABLET BY MOUTH DAILY FOR CHOLESTEROL 90 tablet 1   fexofenadine (ALLEGRA) 180 MG tablet Take 180 mg by mouth daily.     fluticasone (VERAMYST) 27.5 MCG/SPRAY nasal spray Place 2 sprays into the nose daily as needed for rhinitis. 10 g 8   Fluticasone Propionate (FLONASE NA) Place into the nose.     furosemide (LASIX) 20 MG tablet Take 1 tablet Daily as needed for Fluid Retention  and Ankle / Leg Swelling 90 tablet 3   IRON PO Take 50 mg by mouth daily.     labetalol (NORMODYNE) 100 MG tablet TAKE 1 TABLET(100 MG) BY MOUTH AT BEDTIME 90 tablet 3   Magnesium 400 MG CAPS Take by mouth daily.     methocarbamol (ROBAXIN) 500 MG tablet Take 1 tablet (500 mg total) by mouth every 8 (eight) hours as needed. For muscle spasms. Do not take and drive. 60 tablet 0   Multiple Vitamins-Minerals (MULTIVITAMIN WITH MINERALS) tablet Take 1 tablet by mouth daily.     nitroGLYCERIN (NITROSTAT) 0.4 MG SL tablet Place 1 tablet (0.4 mg total) under the tongue every 5 (five) minutes as needed for chest pain. 25 tablet 12   pantoprazole (PROTONIX) 40 MG tablet Take  1 tablet  2 x /day (every 12 hours ) to Prevent Heartburn & Indigestion 180 tablet 3   promethazine-dextromethorphan (PROMETHAZINE-DM) 6.25-15 MG/5ML syrup Take 5 mLs by mouth 4  (four) times daily as needed for cough. 240 mL 0   vitamin C (ASCORBIC ACID) 500 MG tablet Take 500 mg by mouth daily.     Zinc 50 MG TABS Take by mouth daily.     No current facility-administered medications for this visit.    Social History   Socioeconomic History   Marital status: Married    Spouse name: Not on file   Number of children: Not on file   Years of education: Not on file   Highest education level: Not on file  Occupational History   Not on file  Tobacco Use   Smoking status: Former    Types: Cigarettes    Quit date: 09/03/1965    Years since quitting: 56.7   Smokeless tobacco: Never  Substance and Sexual Activity   Alcohol use: Yes    Alcohol/week: 3.0 standard drinks of alcohol    Types: 3 Standard drinks or equivalent per week    Comment: rare   Drug use: No   Sexual activity: Not on file  Other Topics Concern   Not on file  Social History Narrative   Not on file   Social Determinants of Health   Financial Resource Strain:  Not on file  Food Insecurity: Not on file  Transportation Needs: Not on file  Physical Activity: Not on file  Stress: Not on file  Social Connections: Not on file  Intimate Partner Violence: Not on file   Socially she is married has 2 children 3 grandchildren. She does not routinely exercise. There is no tobacco use. She does drink occasional alcohol. She is retired Systems analyst.  Family History  Problem Relation Age of Onset   Heart disease Brother    Hyperlipidemia Brother    Breast cancer Neg Hx    ROS General: Negative; No fevers, chills, or night sweats;  HEENT: Negative; No changes in vision or hearing, sinus congestion, difficulty swallowing Pulmonary: Negative; No cough, wheezing, shortness of breath, hemoptysis Cardiovascular: Negative; No chest pain, presyncope, syncope, palpatations GI: Negative; No nausea, vomiting, diarrhea, or abdominal pain GU: Negative; No dysuria, hematuria, or difficulty  voiding Musculoskeletal: Positive for a Raynaud's; back issues with compression fracture Hematologic/Oncology: Negative; no easy bruising, bleeding Endocrine: Negative; no heat/cold intolerance; no diabetes Neuro: Negative; no changes in balance, headaches Skin: Negative; No rashes or skin lesions Psychiatric: Negative; No behavioral problems, depression Sleep: Positive for obstructive sleep apnea on CPAP; machine no longer wireless.  She is in need for a new mask. Other comprehensive 14 point system review is negative.  PE BP 126/70   Pulse 71   Ht 5\' 2"  (1.575 m)   Wt 147 lb 12.8 oz (67 kg)   SpO2 96%   BMI 27.03 kg/m    Repeat blood pressure by me was 126/70  Wt Readings from Last 3 Encounters:  05/19/22 147 lb 12.8 oz (67 kg)  04/15/22 146 lb 12.8 oz (66.6 kg)  12/03/21 148 lb 9.6 oz (67.4 kg)   General: Alert, oriented, no distress.  Skin: normal turgor, no rashes, warm and dry HEENT: Normocephalic, atraumatic. Pupils equal round and reactive to light; sclera anicteric; extraocular muscles intact;  Nose without nasal septal hypertrophy Mouth/Parynx benign; Mallinpatti scale 3 Neck: No JVD, no carotid bruits; normal carotid upstroke Lungs: clear to ausculatation and percussion; no wheezing or rales Chest wall: without tenderness to palpitation Heart: PMI not displaced, RRR, s1 s2 normal, 1/6 systolic murmur the aortic area and lower sternal border, no diastolic murmur, no rubs, gallops, thrills, or heaves Abdomen: soft, nontender; no hepatosplenomehaly, BS+; abdominal aorta nontender and not dilated by palpation. Back: no CVA tenderness Pulses 2+ Musculoskeletal: full range of motion, normal strength, no joint deformities Extremities: no clubbing cyanosis or edema, Homan's sign negative  Neurologic: grossly nonfocal; Cranial nerves grossly wnl Psychologic: Normal mood and affect   May 19, 2022 ECG (independently read by me): NSR at 71, siinus arrhythmia , NSSTT  changes  June 17, 2021 ECG (independently read by me): Sinus rhythm at 63 bpm with sinus arrhythmia.  No ST segment changes.  Normal intervals  March 23, 2021 ECG (independently read by me):  NSR at 68 with sinus arrythmia  March 04, 2020 ECG (independently read by me):Sinus rhythm at 75 with [facility]  February 14, 2020 ECG (independently read by me): Normal sinus rhythm at 64 bpm.  Isolated PAC, nonspecific ST changes.  Normal intervals.  November 2020 ECG (independently read by me): Sinus rhythm at 61 bpm with mild sinus arrhythmia.  Normal intervals.  No ST segment changes  April 2019  ECG (independently read by me): Sinus bradycardia 58 bpm.  No ectopy.  Normal intervals.  September 2018 ECG (independently read by me):  Normal sinus rhythm at 62 bpm.  Nonspecific T-wave abnormality  September 2017 ECG (independently read by me): Sinus bradycardia 56 bpm with mild sinus arrhythmia.  Nonspecific T changes.  September 2016 ECG (independently read by me): Sinus bradycardia with sinus arrhythmia.  No significant ST-T changes.  August 2015 ECG: Sinus rhythm at 51 beats per minute. Non-specific T changes.  LABS:    Latest Ref Rng & Units 04/15/2022    3:51 PM 12/03/2021    3:20 PM 08/25/2021    3:06 PM  BMP  Glucose 65 - 99 mg/dL 133  87  140   BUN 7 - 25 mg/dL 16  20  18    Creatinine 0.60 - 1.00 mg/dL 0.73  0.78  0.74   BUN/Creat Ratio 6 - 22 (calc) SEE NOTE:  SEE NOTE:  NOT APPLICABLE   Sodium A999333 - 146 mmol/L 142  141  141   Potassium 3.5 - 5.3 mmol/L 3.9  4.0  4.1   Chloride 98 - 110 mmol/L 106  105  106   CO2 20 - 32 mmol/L 24  27  25    Calcium 8.6 - 10.4 mg/dL 9.2  9.2  8.9       Latest Ref Rng & Units 04/15/2022    3:51 PM 12/03/2021    3:20 PM 08/25/2021    3:06 PM  Hepatic Function  Total Protein 6.1 - 8.1 g/dL 6.5  6.5  6.2   AST 10 - 35 U/L 20  21  21    ALT 6 - 29 U/L 20  21  22    Total Bilirubin 0.2 - 1.2 mg/dL 0.4  0.4  0.4       Latest Ref Rng & Units 04/15/2022     3:51 PM 12/03/2021    3:20 PM 08/25/2021    3:06 PM  CBC  WBC 3.8 - 10.8 Thousand/uL 6.5  5.8  5.7   Hemoglobin 11.7 - 15.5 g/dL 12.9  12.6  12.9   Hematocrit 35.0 - 45.0 % 38.0  37.1  37.9   Platelets 140 - 400 Thousand/uL 242  186  197    Lab Results  Component Value Date   MCV 91.8 04/15/2022   MCV 92.1 12/03/2021   MCV 93.1 08/25/2021   Lab Results  Component Value Date   TSH 3.76 12/03/2021   Lab Results  Component Value Date   HGBA1C 6.2 (H) 04/15/2022   Lipid Panel     Component Value Date/Time   CHOL 159 04/15/2022 1551   TRIG 270 (H) 04/15/2022 1551   HDL 59 04/15/2022 1551   CHOLHDL 2.7 04/15/2022 1551   VLDL 22 10/04/2016 1114   LDLCALC 64 04/15/2022 1551    RADIOLOGY: No results found.  IMPRESSION: Tachycardia now seeing  1. CAD in native artery   2. Hx of CABG: March 02, 2000.   3. OSA (obstructive sleep apnea)   4. Hyperlipidemia with target LDL less than 70   5. Mild aortic stenosis   6. Raynaud's disease without gangrene      ASSESSMENT AND PLAN: Jasmine Reeves is a 82 year old Caucasian female who underwent cardiac catheterization on New Year's Eve 2001.  Catheterization revealed severe two-vessel coronary artery disease and she underwent CABG revascularization surgery on 03/02/2000. Her last catheterization in 2008 showed patent grafts. She has normal LV function and mitral valve prolapse.   A nuclear perfusion study in 2015 was unchanged and continued to show normal perfusion without scar or ischemia.  She did have exercise-induced ST changes.  She has a history of Raynaud's phenomena which has successfully been treated with her diltiazem.  Nuclear and echo studies were done in late 2020 prior to her planned bladder surgery.  She again was documented to have normal systolic function and had mild aortic stenosis with a mean gradient of 10 and peak gradient of 14 mm.  Estimated aortic valve area was 1.59 cm.  Nuclear study continued to show  normal perfusion.   She sustained a 30% partial fracture at T12 when she was helping care for her husband who is developed progressive Parkinson's debility.  At her evaluation in January 2023 she was experiencing some nonexertional chest discomfort.  That time I recommended she undergo a follow-up nuclear study since it was 21 years since her CABG revascularization. Her echo and nuclear studies from May 11, 2021 remain stable with normal myocardial perfusion without scar or ischemia.  Her echo Doppler study essentially was unchanged and demonstrated normal LV function with very mild aortic stenosis.  Blood pressure today is excellent on current therapy.  She remains asymptomatic cardiovascularly.  She continues to be on Zetia/simvastatin for lipid management followed by Dr. Melford Aase.  With reference to her obstructive sleep apnea, she received a new ResMed AirSense 11 AutoSet unit on January 11, 2022 with [employer] providing the replacement device.  I reviewed her download.  AHI is excellent at 0.7 with a pressure range of 8-12.  95th percentile pressure is 9.8 with maximum average pressure of 10.5.  She is averaging 6 hours and 53 minutes of CPAP use per night.  I recommended at least 7 to 8 hours if at all possible.  Cardiovascularly, she is stable.  She is scheduled to undergo colonoscopy in near future.  She is stable.  She does have some scoliosis.  She also has some discomfort resulting from compression fractures of her thorax.  Cardiovascularly she is stable.  I will see her in 1 year for follow-up evaluation or sooner as needed.  Troy Sine, MD, [facility]  05/26/2022 6:26 PM

## 2022-05-25 DIAGNOSIS — K5730 Diverticulosis of large intestine without perforation or abscess without bleeding: Secondary | ICD-10-CM | POA: Diagnosis not present

## 2022-05-25 DIAGNOSIS — Z09 Encounter for follow-up examination after completed treatment for conditions other than malignant neoplasm: Secondary | ICD-10-CM | POA: Diagnosis not present

## 2022-05-25 DIAGNOSIS — K649 Unspecified hemorrhoids: Secondary | ICD-10-CM | POA: Diagnosis not present

## 2022-05-25 DIAGNOSIS — D123 Benign neoplasm of transverse colon: Secondary | ICD-10-CM | POA: Diagnosis not present

## 2022-05-25 DIAGNOSIS — Z86010 Personal history of colonic polyps: Secondary | ICD-10-CM | POA: Diagnosis not present

## 2022-05-26 ENCOUNTER — Encounter: Payer: Self-pay | Admitting: Cardiovascular Disease

## 2022-05-31 DIAGNOSIS — D123 Benign neoplasm of transverse colon: Secondary | ICD-10-CM | POA: Diagnosis not present

## 2022-06-12 DIAGNOSIS — G4733 Obstructive sleep apnea (adult) (pediatric): Secondary | ICD-10-CM | POA: Diagnosis not present

## 2022-06-15 ENCOUNTER — Ambulatory Visit: Payer: Medicare PPO | Admitting: Internal Medicine

## 2022-07-03 ENCOUNTER — Other Ambulatory Visit: Payer: Self-pay | Admitting: Nurse Practitioner

## 2022-07-03 DIAGNOSIS — E782 Mixed hyperlipidemia: Secondary | ICD-10-CM

## 2022-07-03 DIAGNOSIS — I10 Essential (primary) hypertension: Secondary | ICD-10-CM

## 2022-07-12 DIAGNOSIS — G4733 Obstructive sleep apnea (adult) (pediatric): Secondary | ICD-10-CM | POA: Diagnosis not present

## 2022-07-18 NOTE — Patient Instructions (Signed)

## 2022-07-18 NOTE — Progress Notes (Unsigned)
Future Appointments  Date Time Provider Department  04/15/2022  [facility]  Tonya   07/19/2022             6 mo ov  2:30 PM [name], MD [facility]  08/13/2022  2:30 PM Eldred Manges, MD [facility]  12/08/2022              cpe   2:00 PM [name], MD [facility]  06/08/2023               wellness 11:00 AM Raynelle Dick, NP [facility]    History of Present Illness:       This very nice 82 y.o. MWF  presents for 6 month follow up with HTN, ASHD HLD, Pre-Diabetes and Vitamin D Deficiency. Patient is on CPAP for OSA with improved restorative sleep         Patient is treated for HTN  since 1988  & BP has been controlled at home. Today's BP is at  goal - 118/60.    In 2001, patient presented with ACS & had PCA/Stents x 2 and then in Jan 2002 , she underwent CABG.  Stress Myoview in 2015 was negative. Patient has had no complaints of any cardiac type chest pain, palpitations, dyspnea / orthopnea / PND, dizziness, claudication, or dependent edema .       Hyperlipidemia is controlled with diet & Vytorin. Patient denies myalgias or other med SE's. Last Lipids were at goal except elevated Trig's :  Lab Results  Component Value Date   CHOL 159 04/15/2022   HDL 59 04/15/2022   LDLCALC 64 04/15/2022   TRIG 270 (H) 04/15/2022   CHOLHDL 2.7 04/15/2022     Also, the patient has history of PreDiabetes  (A1c 6.3% /2012 and 5.8% /2019 ) and has had no symptoms of reactive hypoglycemia, diabetic polys, paresthesias or visual blurring.  Last A1c was not at goal :  Lab Results  Component Value Date   HGBA1C 6.2 (H) 04/15/2022                                                          Further, the patient also has history of Vitamin D Deficiency and supplements vitamin D . Last vitamin D was at goal :  Lab Results  Component Value Date   VD25OH 71 04/15/2022     Current Outpatient Medications on File Prior to Visit  Medication Sig   albuterol (VENTOLIN HFA) 108 (90 Base) MCG/ACT  inhaler Inhale 2 puffs into the lungs every 6 (six) hours as needed for wheezing or shortness of breath.   alendronate (FOSAMAX) 70 MG tablet Take 1 tablet (70 mg total) by mouth once a week. Take with a full glass of water on an empty stomach.   aspirin 81 MG tablet Take 81 mg by mouth daily.   CALCIUM CITRATE PO Take 500 mg by mouth daily.   cetirizine (ZYRTEC) 10 MG tablet Take 10 mg by mouth daily.   Cholecalciferol (VITAMIN D) 2000 UNITS tablet Take 2,000 Units by mouth 2 (two) times daily.   diltiazem (DILT-XR) 180 MG 24 hr capsule TAKE 1 CAPSULE BY MOUTH DAILY   docusate sodium (COLACE) 100 MG capsule Take 1 capsule (100 mg total) by mouth every 12 (twelve) hours.   estradiol (ESTRACE)  0.1 MG/GM vaginal cream estradiol 0.01% (0.1 mg/gram) vaginal cream  INSERT 2 GRAMS INTO THE VAGINA DAILY FOR FIRST WEEK THEN TWICE WEEKLY AFTER FIRST WEEK   ezetimibe-simvastatin (VYTORIN) 10-20 MG tablet TAKE 1 TABLET BY MOUTH DAILY FOR CHOLESTEROL   fexofenadine (ALLEGRA) 180 MG tablet Take 180 mg by mouth daily.   fluticasone (VERAMYST) 27.5 MCG/SPRAY nasal spray Place 2 sprays into the nose daily as needed for rhinitis.   FLONASE  Place into the nose.   furosemide 20 MG tablet Take 1 tablet Daily as needed    IRON PO Take 50 mg by mouth daily.   labetalol  100 MG tablet TAKE 1 TABLET AT BEDTIME   Magnesium 400 MG CAPS Take   daily.   methocarbamol 500 MG tablet Take 1 tablet   every 8  hours as needed.     Multiple Vitamins-Minerals  Take 1 tablet   daily.   NITROSTAT 0.4 MG SL tablet as needed for chest pain.   pantoprazole (PROTONIX) 40 MG tablet Take  1 tablet  2 x /day (every 12 hours )   vitamin C  500 MG tablet Take daily.   Zinc 50 MG TABS Take by mouth daily.     Allergies  Allergen Reactions   Altace [Ramipril]     cough   Azithromycin     palpitation   Celebrex [Celecoxib]     Gi upset    Hydrochlorothiazide     cramping   Ppd [Tuberculin Purified Protein Derivative]      Positive PPD 1998   Prilosec [Omeprazole]     constipation   Tape Other (See Comments)   Tapentadol     Other reaction(s): Other (See Comments), redness & itch     PMHx:   Past Medical History:  Diagnosis Date   COPD (chronic obstructive pulmonary disease) (HCC)    Fibromyalgia    GERD (gastroesophageal reflux disease)    IBS (irritable bowel syndrome)    Scoliosis      Immunization History  Administered Date(s) Administered   Fluad Quad(high Dose 65+) 11/16/2021   Influenza Inj Mdck Quad Pf 11/29/2021   Influenza, High Dose Seasonal PF 11/11/2014, 12/08/2015   Influenza,inj,Quad PF,6+ Mos 11/27/2016, 11/20/2017, 11/14/2018, 11/11/2020   Influenza-Unspecified 11/20/2019   PFIZER(Purple Top)SARS-COV-2 Vaccination 04/03/2019, 04/26/2019   Pneumococcal Conjugate-13 04/16/2014   Pneumococcal-Unspecified 03/01/1998, 03/12/2010   Td 03/27/2012   Tdap 02/10/2018   Zoster, Live 03/02/2007      Past Surgical History:  Procedure Laterality Date   ABDOMINAL HYSTERECTOMY     APPENDECTOMY     BREAST BIOPSY Left 04/23/2022   [place] LT BREAST BX W LOC DEV 1ST LESION IMG BX SPEC US GUIDE 04/23/2022 [facility]   CORONARY ANGIOPLASTY  12/11   CORONARY ARTERY BYPASS GRAFT     CYSTOCELE REPAIR  03/12/2019   cystocele and rectocele repair, Dr. Jennette Kettle   ESOPHAGOGASTRODUODENOSCOPY  07/21/12   polyps, Hiatal hernia Dr. Matthias Hughs   TONSILLECTOMY     TUBAL LIGATION       FHx:    Reviewed / unchanged   SHx:    Reviewed / unchanged    Systems Review:  Constitutional: Denies fever, chills, wt changes, headaches, insomnia, fatigue, night sweats, change in appetite. Eyes: Denies redness, blurred vision, diplopia, discharge, itchy, watery eyes.  ENT: Denies discharge, congestion, post nasal drip, epistaxis, sore throat, earache, hearing loss, dental pain, tinnitus, vertigo, sinus pain, snoring.  CV: Denies chest pain, palpitations, irregular heartbeat, syncope, dyspnea, diaphoresis,  orthopnea, PND, claudication or edema. Respiratory: denies cough, dyspnea, DOE, pleurisy, hoarseness, laryngitis, wheezing.  Gastrointestinal: Denies dysphagia, odynophagia, heartburn, reflux, water brash, abdominal pain or cramps, nausea, vomiting, bloating, diarrhea, constipation, hematemesis, melena, hematochezia  or hemorrhoids. Genitourinary: Denies dysuria, frequency, urgency, nocturia, hesitancy, discharge, hematuria or flank pain. Musculoskeletal: Denies arthralgias, myalgias, stiffness, jt. swelling, pain, limping or strain/sprain.  Skin: Denies pruritus, rash, hives, warts, acne, eczema or change in skin lesion(s). Neuro: No weakness, tremor, incoordination, spasms, paresthesia or pain. Psychiatric: Denies confusion, memory loss or sensory loss. Endo: Denies change in weight, skin or hair change.  Heme/Lymph: No excessive bleeding, bruising or enlarged lymph nodes.   Physical Exam  BP 118/60   Pulse 86   Temp 97.9 F (36.6 C)   Resp 16   Ht 5\' 2"  (1.575 m)   Wt 147 lb 6.4 oz (66.9 kg)   SpO2 96%   BMI 26.96 kg/m   Appears  well nourished, well groomed  and in no distress.  Eyes: PERRLA, EOMs, conjunctiva no swelling or erythema. Sinuses: No frontal/maxillary tenderness ENT/Mouth: EAC's clear, TM's nl w/o erythema, bulging. Nares clear w/o erythema, swelling, exudates. Oropharynx clear without erythema or exudates. Oral hygiene is good. Tongue normal, non obstructing. Hearing intact.  Neck: Supple. Thyroid not palpable. Car 2+/2+ without bruits, nodes or JVD. Chest: Respirations nl with BS clear & equal w/o rales, rhonchi, wheezing or stridor.  Cor: Heart sounds normal w/ regular rate and rhythm without sig. murmurs, gallops, clicks or rubs. Peripheral pulses normal and equal  without edema.  Abdomen: Soft & bowel sounds normal. Non-tender w/o guarding, rebound, hernias, masses or organomegaly.  Lymphatics: Unremarkable.  Musculoskeletal: Full ROM all peripheral  extremities, joint stability, 5/5 strength and normal gait.  Skin: Warm, dry without exposed rashes, lesions or ecchymosis apparent.  Neuro: Cranial nerves intact, reflexes equal bilaterally. Sensory-motor testing grossly intact. Tendon reflexes grossly intact.  Pysch: Alert & oriented x 3.  Insight and judgement nl & appropriate. No ideations.   Assessment and Plan:  1. Essential hypertension  - Continue medication, monitor blood pressure at home.  - Continue DASH diet.  Reminder to go to the ER if any CP,  SOB, nausea, dizziness, severe HA, changes vision/speech.   - CBC with Differential/Platelet - COMPLETE METABOLIC PANEL WITH GFR - Magnesium - TSH  2. Hyperlipidemia, mixed  - Continue diet/meds, exercise,& lifestyle modifications.  - Continue monitor periodic cholesterol/liver & renal functions    - Lipid panel - TSH  3. Abnormal glucose  - Continue diet, exercise  - Lifestyle modifications.  - Monitor appropriate labs    - Hemoglobin A1c - Insulin, random  4. Vitamin D deficiency  - Continue supplementation  - VITAMIN D 25 Hydroxy   5. 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 Aortic atherosclerosis (HCC) by Abd CT scan on 02/16/2020  - Lipid panel  7. Medication management  - CBC with Differential/Platelet - COMPLETE METABOLIC PANEL WITH GFR - Magnesium - Lipid panel - TSH - Hemoglobin A1c - Insulin, random - VITAMIN D 25 Hydroxy          Discussed  regular exercise, BP monitoring, weight control to achieve/maintain BMI less than 25 and discussed med and SE's. Recommended labs to assess /monitor clinical status .  I discussed the assessment and treatment plan with the patient. The patient was provided an opportunity to ask questions and all  were answered. The patient agreed with the plan and demonstrated an understanding of the instructions.  I provided  over 30 minutes of exam, counseling, chart review and  complex critical decision making.        The patient was advised to call back or seek an in-person evaluation if the symptoms worsen or if the condition fails to improve as anticipated.   Marinus Maw, MD

## 2022-07-19 ENCOUNTER — Encounter: Payer: Self-pay | Admitting: Internal Medicine

## 2022-07-19 ENCOUNTER — Ambulatory Visit: Payer: Medicare PPO | Admitting: Internal Medicine

## 2022-07-19 VITALS — BP 118/60 | HR 86 | Temp 97.9°F | Resp 16 | Ht 62.0 in | Wt 147.4 lb

## 2022-07-19 DIAGNOSIS — R7309 Other abnormal glucose: Secondary | ICD-10-CM

## 2022-07-19 DIAGNOSIS — Z79899 Other long term (current) drug therapy: Secondary | ICD-10-CM | POA: Diagnosis not present

## 2022-07-19 DIAGNOSIS — E559 Vitamin D deficiency, unspecified: Secondary | ICD-10-CM

## 2022-07-19 DIAGNOSIS — I700 Atherosclerosis of aorta: Secondary | ICD-10-CM | POA: Diagnosis not present

## 2022-07-19 DIAGNOSIS — E782 Mixed hyperlipidemia: Secondary | ICD-10-CM

## 2022-07-19 DIAGNOSIS — I10 Essential (primary) hypertension: Secondary | ICD-10-CM

## 2022-07-19 DIAGNOSIS — I25118 Atherosclerotic heart disease of native coronary artery with other forms of angina pectoris: Secondary | ICD-10-CM

## 2022-07-20 LAB — COMPLETE METABOLIC PANEL WITH GFR
AG Ratio: 2 (calc) (ref 1.0–2.5)
ALT: 21 U/L (ref 6–29)
AST: 24 U/L (ref 10–35)
Albumin: 4.5 g/dL (ref 3.6–5.1)
Alkaline phosphatase (APISO): 70 U/L (ref 37–153)
BUN: 25 mg/dL (ref 7–25)
CO2: 25 mmol/L (ref 20–32)
Calcium: 9.4 mg/dL (ref 8.6–10.4)
Chloride: 105 mmol/L (ref 98–110)
Creat: 0.83 mg/dL (ref 0.60–1.00)
Globulin: 2.2 g/dL (calc) (ref 1.9–3.7)
Glucose, Bld: 96 mg/dL (ref 65–99)
Potassium: 4.7 mmol/L (ref 3.5–5.3)
Sodium: 141 mmol/L (ref 135–146)
Total Bilirubin: 0.5 mg/dL (ref 0.2–1.2)
Total Protein: 6.7 g/dL (ref 6.1–8.1)
eGFR: 72 mL/min/{1.73_m2} (ref >=60)

## 2022-07-20 LAB — CBC WITH DIFFERENTIAL/PLATELET
Absolute Monocytes: 496 cells/uL (ref 200–950)
Basophils Absolute: 50 cells/uL (ref 0–200)
Basophils Relative: 0.8 %
Eosinophils Absolute: 211 cells/uL (ref 15–500)
Eosinophils Relative: 3.4 %
HCT: 39.8 % (ref 35.0–45.0)
Hemoglobin: 13.1 g/dL (ref 11.7–15.5)
Lymphs Abs: 1451 cells/uL (ref 850–3900)
MCH: 30.4 pg (ref 27.0–33.0)
MCHC: 32.9 g/dL (ref 32.0–36.0)
MCV: 92.3 fL (ref 80.0–100.0)
MPV: 9.8 fL (ref 7.5–12.5)
Monocytes Relative: 8 %
Neutro Abs: 3993 cells/uL (ref 1500–7800)
Neutrophils Relative %: 64.4 %
Platelets: 205 10*3/uL (ref 140–400)
RBC: 4.31 10*6/uL (ref 3.80–5.10)
RDW: 11.8 % (ref 11.0–15.0)
Total Lymphocyte: 23.4 %
WBC: 6.2 10*3/uL (ref 3.8–10.8)

## 2022-07-20 LAB — TSH: TSH: 3.39 mIU/L (ref 0.40–4.50)

## 2022-07-20 LAB — HEMOGLOBIN A1C
Hgb A1c MFr Bld: 6.1 % of total Hgb — ABNORMAL HIGH (ref <5.7)
Mean Plasma Glucose: 128 mg/dL
eAG (mmol/L): 7.1 mmol/L

## 2022-07-20 LAB — LIPID PANEL
Cholesterol: 158 mg/dL (ref <200)
HDL: 67 mg/dL (ref >=50)
LDL Cholesterol (Calc): 75 mg/dL (calc)
Non-HDL Cholesterol (Calc): 91 mg/dL (calc) (ref <130)
Total CHOL/HDL Ratio: 2.4 (calc) (ref <5.0)
Triglycerides: 80 mg/dL (ref <150)

## 2022-07-20 LAB — INSULIN, RANDOM: Insulin: 12.5 u[IU]/mL

## 2022-07-20 LAB — MAGNESIUM: Magnesium: 2.3 mg/dL (ref 1.5–2.5)

## 2022-07-20 LAB — VITAMIN D 25 HYDROXY (VIT D DEFICIENCY, FRACTURES): Vit D, 25-Hydroxy: 73 ng/mL (ref 30–100)

## 2022-07-20 NOTE — Progress Notes (Signed)
^<^<^<^<^<^<^<^<^<^<^<^<^<^<^<^<^<^<^<^<^<^<^<^<^<^<^<^<^<^<^<^<^<^<^<^<^ ^>^>^>^>^>^>^>^>^>^>^>>^>^>^>^>^>^>^>^>^>^>^>^>^>^>^>^>^>^>^>^>^>^>^>^>^>  -  Test results slightly outside the reference range are not unusual. If there is anything important, I will review this with you,  otherwise it is considered normal test values.  If you have further questions,  please do not hesitate to contact me at the office or via My Chart.   ^<^<^<^<^<^<^<^<^<^<^<^<^<^<^<^<^<^<^<^<^<^<^<^<^<^<^<^<^<^<^<^<^<^<^<^<^ ^>^>^>^>^>^>^>^>^>^>^>^>^>^>^>^>^>^>^>^>^>^>^>^>^>^>^>^>^>^>^>^>^>^>^>^>^  -  A1c = 6.1% is still  elevated in the borderline and                                                    early or pre-diabetes range which has the same   300% increased risk for heart attack, stroke, cancer and                                         alzheimer- type vascular dementia as full blown diabetes.   But the good news is that diet, exercise with                                                 weight loss can cure the early diabetes at this point. ^<^<^<^<^<^<^<^<^<^<^<^<^<^<^<^<^<^<^<^<^<^<^<^<^<^<^<^<^<^<^<^<^<^<^<^<^ ^>^>^>^>^>^>^>^>^>^>^>^>^>^>^>^>^>^>^>^>^>^>^>^>^>^>^>^>^>^>^>^>^>^>^>^>^  -  Total Chol = 158   &   LDL = 75  - Both  Excellent   - Very low risk for Heart Attack  / Stroke ^<^<^<^<^<^<^<^<^<^<^<^<^<^<^<^<^<^<^<^<^<^<^<^<^<^<^<^<^<^<^<^<^<^<^<^<^ ^>^>^>^>^>^>^>^>^>^>^>^>^>^>^>^>^>^>^>^>^>^>^>^>^>^>^>^>^>^>^>^>^>^>^>^>^  - Vitamin D = 73 -   Excellent - Please continue dose same  ^<^<^<^<^<^<^<^<^<^<^<^<^<^<^<^<^<^<^<^<^<^<^<^<^<^<^<^<^<^<^<^<^<^<^<^<^ ^>^>^>^>^>^>^>^>^>^>^>^>^>^>^>^>^>^>^>^>^>^>^>^>^>^>^>^>^>^>^>^>^>^>^>^>^  - All Else - CBC - Kidneys - Electrolytes - Liver - Magnesium & Thyroid    - all  Normal /  OK ^<^<^<^<^<^<^<^<^<^<^<^<^<^<^<^<^<^<^<^<^<^<^<^<^<^<^<^<^<^<^<^<^<^<^<^<^ ^>^>^>^>^>^>^>^>^>^>^>^>^>^>^>^>^>^>^>^>^>^>^>^>^>^>^>^>^>^>^>^>^>^>^>^>^

## 2022-07-22 ENCOUNTER — Other Ambulatory Visit: Payer: Self-pay | Admitting: Internal Medicine

## 2022-07-22 DIAGNOSIS — M810 Age-related osteoporosis without current pathological fracture: Secondary | ICD-10-CM

## 2022-07-22 MED ORDER — IBANDRONATE SODIUM 150 MG PO TABS
ORAL_TABLET | ORAL | 3 refills | Status: DC
Start: 2022-07-22 — End: 2023-07-07

## 2022-07-30 ENCOUNTER — Other Ambulatory Visit: Payer: Self-pay | Admitting: Internal Medicine

## 2022-08-12 DIAGNOSIS — G4733 Obstructive sleep apnea (adult) (pediatric): Secondary | ICD-10-CM | POA: Diagnosis not present

## 2022-08-13 ENCOUNTER — Ambulatory Visit: Payer: Medicare PPO | Admitting: Orthopaedic Surgery

## 2022-08-26 ENCOUNTER — Ambulatory Visit: Payer: Medicare PPO | Admitting: Nurse Practitioner

## 2022-09-11 DIAGNOSIS — G4733 Obstructive sleep apnea (adult) (pediatric): Secondary | ICD-10-CM | POA: Diagnosis not present

## 2022-09-27 DIAGNOSIS — M19071 Primary osteoarthritis, right ankle and foot: Secondary | ICD-10-CM | POA: Diagnosis not present

## 2022-10-12 DIAGNOSIS — G4733 Obstructive sleep apnea (adult) (pediatric): Secondary | ICD-10-CM | POA: Diagnosis not present

## 2022-10-25 NOTE — Progress Notes (Unsigned)
3 MONTH FOLLOW UP  Assessment:     Atherosclerosis of aorta Per CT 2018 Control blood pressure, cholesterol, glucose, increase exercise.   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  Prescribed nitroglycerine without recent use Followed by cardiology  Essential hypertension At goal; continue medications Monitor blood pressure at home; call if consistently over 130/80 Continue DASH diet.   Reminder to go to the ER if any CP, SOB, nausea, dizziness, severe HA, changes vision/speech, left arm numbness and tingling and jaw pain.  Chronic obstructive pulmonary disease, unspecified COPD type (HCC) By imaging; denies notable symptoms of dyspnea, secretions, continue to monitor. Remote hx of smoking.   OSA on CPAP Reports typically wears 100% of the time and endorses restorative sleep  Gastroesophageal reflux disease, esophagitis presence not specified Taking PPI BID, H2i didn't help; carafate PRN, GI following Discussed diet, avoiding triggers and other lifestyle changes  Abnormal glucose Has been prediabetic; most recent A1C at goal Discussed disease and risks Discussed diet/exercise, weight management   Medication management -     CBC with Differential/Platelet -     CMP/GFR -     Magnesium   Mixed hyperlipidemia At goal; continue medication Continue low cholesterol diet and exercise.  -     Lipid panel -     TSH  Vitamin D deficiency At goal at recent check; continue to recommend supplementation for goal of 70-100 Defer vitamin D level     Over 40 minutes of exam, counseling, chart review and critical decision making was performed Future Appointments  Date Time Provider Department Center  10/26/2022  2:30 PM Raynelle Dick, NP [facility] None  01/31/2023  2:00 PM Raynelle Dick, NP [facility] None  05/03/2023  2:30 PM  Raynelle Dick, NP [facility] None  08/04/2023  2:30 PM [name], MD [facility] None     Subjective:  Jasmine Reeves is a 82 y.o. female who presents for 3 month follow up.    She is primary caregiver for husband, he has Parkinson's and symptoms are worsening. He had 4 falls recently.  Requiring more care.   She does have osteoporosis but does forget to take her Fosamax. She has DEXA tomorrow.   Hx of 2 vaginal deliveries, s/p hysterectomy with recently s/p surgical repair for cystocele/rectocele, "bladder tack" on 03/12/2019 by Dr. Jennette Kettle. Had some complications, was evaluated by Dr. Deno Etienne affiliated with [facility] in 09/2019 with recurrent prolapse, has recommended pelvic floor physical therapy (with Eulis Foster), pessary, vaginal estrogens, trying to avoid repeat surgery.  Dr. Ashley Royalty is now following her.  A few weeks ago she was having vaginal pain- pessary was in wrong position.  They removed pessary and she has kept it out.  Has appointment in 06/2021 with Dr. Ashley Royalty and in 2 weeks with the PA to determine if she wants pessary reinserted or surgery. She has lost a little bit of bowel control.    she has a diagnosis of GERD which is currently managed by protonix 40 mg BID, has follow up with GI. Is to have one more colonoscopy.    She has been having a lot of post nasal drip.  Has been taking Fexofenadine for a long time.  Also uses Flonase but not regularly.  BMI is There is no height or weight on file to calculate BMI., she has been working on diet, no exercise recently due to  husband's health.  Wt Readings from Last 3 Encounters:  07/19/22 147 lb 6.4 oz (66.9 kg)  05/19/22 147 lb 12.8 oz (67 kg)  04/15/22 146 lb 12.8 oz (66.6 kg)   She has hx of PCA x 2 In Nov & Dec 2001, and CABG in Jan 2002. , she underwent CABG. Had reassuring myocardial perfusion and ECHO in 12/2018 other than known MVP and mild aortic stenosis Continues to follow up with Dr. Tresa Endo. She has aortic  atherosclerosis and COPD per CXR in 2018/2019  Her blood pressure has been controlled at home, today their BP is    BP Readings from Last 3 Encounters:  07/19/22 118/60  05/19/22 126/70  04/15/22 128/78    She does not workout. She denies chest pain, shortness of breath, dizziness.   She is on cholesterol medication (vytorin 10/20) and denies myalgias. Her cholesterol is at goal. The cholesterol last visit was:   Lab Results  Component Value Date   CHOL 158 07/19/2022   HDL 67 07/19/2022   LDLCALC 75 07/19/2022   TRIG 80 07/19/2022   CHOLHDL 2.4 07/19/2022    She has been working on diet and exercise for glucose management, and denies increased appetite, nausea, paresthesia of the feet, polydipsia, polyuria, visual disturbances, vomiting and weight loss. 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 8 g 2   aspirin 81 MG tablet Take 81 mg by mouth daily.     CALCIUM CITRATE PO Take 500 mg by mouth daily.     cetirizine (ZYRTEC) 10 MG tablet Take 10 mg by mouth daily.     Cholecalciferol (VITAMIN D) 2000 UNITS tablet Take 2,000 Units by mouth 2 (two) times daily.     diltiazem (DILT-XR) 180 MG 24 hr capsule TAKE 1 CAPSULE BY MOUTH DAILY 90 capsule 3   docusate sodium (COLACE) 100 MG capsule Take 1 capsule (100 mg total) by mouth every 12 (twelve) hours. 60 capsule 0   estradiol (ESTRACE) 0.1 MG/GM vaginal cream estradiol 0.01% (0.1 mg/gram) vaginal  cream  INSERT 2 GRAMS INTO THE VAGINA DAILY FOR FIRST WEEK THEN TWICE WEEKLY AFTER FIRST WEEK     ezetimibe-simvastatin (VYTORIN) 10-20 MG tablet TAKE 1 TABLET BY MOUTH DAILY FOR CHOLESTEROL 90 tablet 1   fexofenadine (ALLEGRA) 180 MG tablet Take 180 mg by mouth daily.     fluticasone (VERAMYST) 27.5 MCG/SPRAY nasal spray Place 2 sprays into the nose daily as needed for rhinitis. 10 g 8   Fluticasone Propionate (FLONASE NA) Place into the nose.     furosemide (LASIX) 20 MG tablet Take 1 tablet Daily as needed for Fluid Retention  and Ankle / Leg Swelling 90 tablet 3   ibandronate (BONIVA) 150 MG tablet Take  1 tablet every month (every 30 days) in the morning with a full glass of water, on an empty stomach, and do not take anything  else by mouth or lie down for the next 30 min. ( Replaces Fosamax) 3 tablet 3   IRON PO Take 50 mg by mouth daily.     labetalol (NORMODYNE) 100 MG tablet TAKE 1 TABLET(100 MG) BY MOUTH AT BEDTIME 90 tablet 3   Magnesium 400 MG CAPS Take by mouth daily.     methocarbamol (ROBAXIN) 500 MG tablet Take 1 tablet (500 mg total) by mouth every 8 (eight) hours as needed. For muscle spasms. Do not take and drive. 60 tablet 0   Multiple Vitamins-Minerals (MULTIVITAMIN WITH MINERALS) tablet Take 1 tablet by mouth daily.     nitroGLYCERIN (NITROSTAT) 0.4 MG SL tablet Place 1 tablet (0.4 mg total) under the tongue every 5 (five) minutes as needed for chest pain. 25 tablet 12   pantoprazole (PROTONIX) 40 MG tablet TAKE 1 TABLET BY MOUTH TWICE DAILY(EVERY 12 HOURS) TO PREVENT HEARTBURN AND INDIGESTION 180 tablet 3   promethazine-dextromethorphan (PROMETHAZINE-DM) 6.25-15 MG/5ML syrup Take 5 mLs by mouth 4 (four) times daily as needed for cough. 240 mL 0   vitamin C (ASCORBIC ACID) 500 MG tablet Take 500 mg by mouth daily.     Zinc 50 MG TABS Take by mouth daily.     No current facility-administered medications on file prior to visit.    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   Compression fracture of T12 vertebra (HCC) 02/21/2020   Cystocele with rectocele 12/13/2018   BMI 23.0-23.9, adult 11/01/2017   Osteoporosis with current pathological fracture 04/12/2017   OSA on CPAP 11/27/2014   IBS (irritable bowel syndrome) 11/11/2014   Encounter for Medicare annual wellness exam 11/11/2014   Medication management 04/12/2014   Abnormal blood sugar 10/10/2013   Mixed hyperlipidemia 10/08/2013   CAD (coronary artery disease) 02/03/2013   MVP (mitral valve prolapse) 02/03/2013   Vitamin D deficiency    GERD (gastroesophageal reflux disease)    COPD (chronic obstructive pulmonary disease) (HCC) 02/11/2010   Essential hypertension 01/28/2010   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 FAMILY HISTORY Her family history includes Heart disease in her brother; Hyperlipidemia in her brother. SOCIAL HISTORY She  reports that she quit smoking about 57 years ago. Her smoking use included cigarettes. She has never used smokeless tobacco. She reports current alcohol use of about 3.0 standard drinks of alcohol per week. She reports that she does not use drugs.    Review  of Systems  Constitutional:  Negative for chills, fever, malaise/fatigue and weight loss.  HENT:  Negative for congestion, hearing loss, sore throat and tinnitus.   Eyes:  Negative for blurred vision and double vision.  Respiratory:  Negative for  cough, sputum production, shortness of breath and wheezing.   Cardiovascular:  Negative for chest pain, palpitations, orthopnea, claudication and leg swelling.  Gastrointestinal:  Positive for constipation. Negative for abdominal pain, blood in stool, diarrhea, heartburn, melena, nausea and vomiting.  Genitourinary: Negative.        Perineal discomfort following cystocele surgery  Musculoskeletal:  Positive for back pain. Negative for falls, joint pain and myalgias.  Skin:  Negative for rash.  Neurological:  Negative for dizziness, tingling, sensory change, weakness and headaches.  Endo/Heme/Allergies:  Negative for environmental allergies and polydipsia.  Psychiatric/Behavioral: Negative.  Negative for depression, memory loss and substance abuse. The patient is not nervous/anxious and does not have insomnia.   All other systems reviewed and are negative.    Objective:     There were no vitals filed for this visit.  There is no height or weight on file to calculate BMI.  General appearance: alert, no distress, WD/WN, female HEENT: normocephalic, sclerae anicteric, TMs pearly, nares patent, no discharge or erythema, pharynx normal Oral cavity: MMM, no lesions Neck: supple, no lymphadenopathy, no thyromegaly, no masses Heart: RRR, normal S1, S2, no murmurs Lungs: CTA bilaterally, no wheezes, rhonchi, or rales Abdomen: +bs, soft, non tender, mildly distended, no masses, no hepatomegaly, no splenomegaly Musculoskeletal: nontender, no swelling, no obvious deformity Extremities: no edema, no cyanosis, no clubbing Pulses: 2+ symmetric, upper and lower extremities, normal cap refill Neurological: alert, oriented x 3, CN2-12 intact, strength normal  upper extremities and lower extremities, sensation normal throughout, DTRs 2+ throughout, no cerebellar signs, gait normal Psychiatric: normal affect, behavior normal, pleasant    Raynelle Dick, NP   10/25/2022

## 2022-10-26 ENCOUNTER — Ambulatory Visit: Payer: Medicare PPO | Admitting: Nurse Practitioner

## 2022-10-26 ENCOUNTER — Encounter: Payer: Self-pay | Admitting: Nurse Practitioner

## 2022-10-26 VITALS — BP 122/72 | HR 71 | Temp 97.9°F | Ht 62.0 in | Wt 150.6 lb

## 2022-10-26 DIAGNOSIS — K219 Gastro-esophageal reflux disease without esophagitis: Secondary | ICD-10-CM | POA: Diagnosis not present

## 2022-10-26 DIAGNOSIS — I10 Essential (primary) hypertension: Secondary | ICD-10-CM

## 2022-10-26 DIAGNOSIS — G4733 Obstructive sleep apnea (adult) (pediatric): Secondary | ICD-10-CM

## 2022-10-26 DIAGNOSIS — Z79899 Other long term (current) drug therapy: Secondary | ICD-10-CM

## 2022-10-26 DIAGNOSIS — E782 Mixed hyperlipidemia: Secondary | ICD-10-CM | POA: Diagnosis not present

## 2022-10-26 DIAGNOSIS — R300 Dysuria: Secondary | ICD-10-CM | POA: Diagnosis not present

## 2022-10-26 DIAGNOSIS — R7309 Other abnormal glucose: Secondary | ICD-10-CM

## 2022-10-26 DIAGNOSIS — E559 Vitamin D deficiency, unspecified: Secondary | ICD-10-CM

## 2022-10-26 DIAGNOSIS — J449 Chronic obstructive pulmonary disease, unspecified: Secondary | ICD-10-CM | POA: Diagnosis not present

## 2022-10-26 DIAGNOSIS — I700 Atherosclerosis of aorta: Secondary | ICD-10-CM

## 2022-10-26 DIAGNOSIS — I25118 Atherosclerotic heart disease of native coronary artery with other forms of angina pectoris: Secondary | ICD-10-CM

## 2022-10-26 DIAGNOSIS — Z780 Asymptomatic menopausal state: Secondary | ICD-10-CM

## 2022-10-26 DIAGNOSIS — M8580 Other specified disorders of bone density and structure, unspecified site: Secondary | ICD-10-CM

## 2022-10-26 NOTE — Patient Instructions (Signed)
HOKA shoes  Arthritis Arthritis is a term that is commonly used to refer to joint pain or joint disease. There are more than 100 types of arthritis. What are the causes? The most common cause of this condition is wear and tear of a joint. Other causes include: Gout. Inflammation of a joint. An infection of a joint. Sprains and other injuries near the joint. A reaction to medicines or drugs, or an allergic reaction. In some cases, the cause may not be known. What are the signs or symptoms? The main symptom of this condition is pain in the joint during movement. Other symptoms include: Redness, swelling, or stiffness at a joint. Warmth coming from the joint. Fever. Overall feeling of illness. How is this diagnosed? This condition may be diagnosed with a physical exam and tests, including: Blood tests. Urine tests. Imaging tests, such as X-rays, an MRI, or a CT scan. Sometimes, fluid is removed from a joint for testing. How is this treated? This condition may be treated with: Treatment of the cause, if it is known. Rest. Raising (elevating) the joint. Applying cold or hot packs to the joint. Medicines to improve symptoms and reduce inflammation. Injections of a steroid, such as cortisone, into the joint to help reduce pain and inflammation. Depending on the cause of your arthritis, you may need to make lifestyle changes to reduce stress on your joint. Changes may include: Exercising more. Losing weight. Follow these instructions at home: Medicines Take over-the-counter and prescription medicines only as told by your health care provider. Do not take aspirin to relieve pain if your health care provider thinks that gout may be causing your pain. Activity Rest your joint if told by your health care provider. Rest is important when your disease is active and your joint feels painful, swollen, or stiff. Avoid activities that make the pain worse. Balance activity with rest. Exercise  your joint regularly with range-of-motion exercises as told by your health care provider. Try doing low-impact exercise, such as: Swimming. Water aerobics. Biking. Walking. Managing pain, stiffness, and swelling     If directed, put ice on the affected joint. To do this: Put ice in a plastic bag. Place a towel between your skin and the bag. Leave the ice on for 20 minutes, 2-3 times a day. Remove the ice if your skin turns bright red. This is very important. If you cannot feel pain, heat, or cold, you have a greater risk of damage to the area. If your joint is swollen, raise (elevate) it above the level of your heart if directed by your health care provider. If your joint feels stiff in the morning, try taking a warm shower. If directed, apply heat to the affected area as often as told by your health care provider. Use the heat source that your health care provider recommends, such as a moist heat pack or a heating pad. To apply heat: Place a towel between your skin and the heat source. Leave the heat on for 20-30 minutes. Remove the heat if your skin turns bright red. This is especially important if you are unable to feel pain, heat, or cold. You have a greater risk of getting burned. General instructions Maintain a healthy weight. Follow instructions from your health care provider for weight control. Do not use any products that contain nicotine or tobacco. These products include cigarettes, chewing tobacco, and vaping devices, such as e-cigarettes. If you need help quitting, ask your health care provider. Keep all follow-up visits. This  is important. Where to find more information [employer]: www.niams.http://www.myers.net/ Contact a health care provider if: The pain gets worse. You have a fever. Get help right away if: You develop severe joint pain, swelling, or redness. Many joints become painful and swollen. You develop severe back pain. You develop severe weakness in your  leg. Summary Arthritis is a term that is commonly used to refer to joint pain or joint disease. There are more than 100 types of arthritis. The most common cause of this condition is wear and tear of a joint. Other causes include gout, inflammation or infection of the joint, sprains, or allergies. Symptoms of this condition include redness, swelling, or stiffness of the joint. Other symptoms include warmth, fever, or feeling ill. This condition is treated with rest, elevation, medicines, and applying cold or hot packs. Follow your health care provider's instructions about medicines, activity, exercises, and other home care treatments. This information is not intended to replace advice given to you by your health care provider. Make sure you discuss any questions you have with your health care provider. Document Revised: 11/25/2020 Document Reviewed: 11/25/2020 Elsevier Patient Education  2024 [employer].

## 2022-10-27 LAB — COMPLETE METABOLIC PANEL WITH GFR
AG Ratio: 2 (calc) (ref 1.0–2.5)
ALT: 25 U/L (ref 6–29)
AST: 26 U/L (ref 10–35)
Albumin: 4.5 g/dL (ref 3.6–5.1)
Alkaline phosphatase (APISO): 67 U/L (ref 37–153)
BUN: 18 mg/dL (ref 7–25)
CO2: 28 mmol/L (ref 20–32)
Calcium: 9.5 mg/dL (ref 8.6–10.4)
Chloride: 104 mmol/L (ref 98–110)
Creat: 0.64 mg/dL (ref 0.60–0.95)
Globulin: 2.2 g/dL (ref 1.9–3.7)
Glucose, Bld: 82 mg/dL (ref 65–99)
Potassium: 4.1 mmol/L (ref 3.5–5.3)
Sodium: 141 mmol/L (ref 135–146)
Total Bilirubin: 0.4 mg/dL (ref 0.2–1.2)
Total Protein: 6.7 g/dL (ref 6.1–8.1)
eGFR: 89 mL/min/{1.73_m2} (ref >=60)

## 2022-10-27 LAB — CBC WITH DIFFERENTIAL/PLATELET
Absolute Monocytes: 624 {cells}/uL (ref 200–950)
Basophils Absolute: 39 {cells}/uL (ref 0–200)
Basophils Relative: 0.6 %
Eosinophils Absolute: 189 cells/uL (ref 15–500)
Eosinophils Relative: 2.9 %
HCT: 38.9 % (ref 35.0–45.0)
Hemoglobin: 12.8 g/dL (ref 11.7–15.5)
Lymphs Abs: 1801 {cells}/uL (ref 850–3900)
MCH: 31.1 pg (ref 27.0–33.0)
MCHC: 32.9 g/dL (ref 32.0–36.0)
MCV: 94.4 fL (ref 80.0–100.0)
MPV: 10.1 fL (ref 7.5–12.5)
Monocytes Relative: 9.6 %
Neutro Abs: 3848 {cells}/uL (ref 1500–7800)
Neutrophils Relative %: 59.2 %
Platelets: 209 10*3/uL (ref 140–400)
RBC: 4.12 10*6/uL (ref 3.80–5.10)
RDW: 11.8 % (ref 11.0–15.0)
Total Lymphocyte: 27.7 %
WBC: 6.5 10*3/uL (ref 3.8–10.8)

## 2022-10-27 LAB — LIPID PANEL
Cholesterol: 149 mg/dL (ref <200)
HDL: 68 mg/dL (ref >=50)
LDL Cholesterol (Calc): 58 mg/dL
Non-HDL Cholesterol (Calc): 81 mg/dL (ref <130)
Total CHOL/HDL Ratio: 2.2 (calc) (ref <5.0)
Triglycerides: 156 mg/dL — ABNORMAL HIGH (ref <150)

## 2022-10-27 LAB — URINALYSIS, ROUTINE W REFLEX MICROSCOPIC
Bilirubin Urine: NEGATIVE
Glucose, UA: NEGATIVE
Hgb urine dipstick: NEGATIVE
Hyaline Cast: NONE SEEN /LPF
Ketones, ur: NEGATIVE
Nitrite: NEGATIVE
Protein, ur: NEGATIVE
RBC / HPF: NONE SEEN /HPF (ref 0–2)
Specific Gravity, Urine: 1.021 (ref 1.001–1.035)
pH: 5.5 (ref 5.0–8.0)

## 2022-10-27 LAB — URINE CULTURE
MICRO NUMBER:: 15388892
SPECIMEN QUALITY:: ADEQUATE

## 2022-10-27 LAB — MAGNESIUM: Magnesium: 2.2 mg/dL (ref 1.5–2.5)

## 2022-10-27 LAB — MICROSCOPIC MESSAGE

## 2022-11-12 DIAGNOSIS — G4733 Obstructive sleep apnea (adult) (pediatric): Secondary | ICD-10-CM | POA: Diagnosis not present

## 2022-12-08 ENCOUNTER — Encounter: Payer: Medicare PPO | Admitting: Internal Medicine

## 2022-12-12 DIAGNOSIS — G4733 Obstructive sleep apnea (adult) (pediatric): Secondary | ICD-10-CM | POA: Diagnosis not present

## 2022-12-15 ENCOUNTER — Encounter: Payer: Self-pay | Admitting: Internal Medicine

## 2022-12-15 ENCOUNTER — Other Ambulatory Visit: Payer: Self-pay | Admitting: Internal Medicine

## 2022-12-15 MED ORDER — MELOXICAM 15 MG PO TABS: ORAL_TABLET | ORAL | 3 refills | Status: AC

## 2022-12-25 ENCOUNTER — Other Ambulatory Visit: Payer: Self-pay | Admitting: Nurse Practitioner

## 2022-12-25 DIAGNOSIS — I10 Essential (primary) hypertension: Secondary | ICD-10-CM

## 2022-12-25 DIAGNOSIS — E782 Mixed hyperlipidemia: Secondary | ICD-10-CM

## 2023-01-12 DIAGNOSIS — G4733 Obstructive sleep apnea (adult) (pediatric): Secondary | ICD-10-CM | POA: Diagnosis not present

## 2023-01-25 ENCOUNTER — Other Ambulatory Visit: Payer: Self-pay | Admitting: Cardiovascular Disease

## 2023-01-26 ENCOUNTER — Other Ambulatory Visit: Payer: Self-pay | Admitting: Internal Medicine

## 2023-01-26 DIAGNOSIS — Z0000 Encounter for general adult medical examination without abnormal findings: Secondary | ICD-10-CM

## 2023-01-26 NOTE — Progress Notes (Signed)
COMPLETE PHYSICAL AND FOLLOW UP  Assessment:   Diagnoses and all orders for this visit:  Encounter for General Adult Medical Examination with Abnormal Findings Due annually  Mammogram scheduled for 02/17/23   Atherosclerosis of aorta Per CT 2018 Control blood pressure, cholesterol, glucose, increase exercise.   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  Prescribed nitroglycerine without recent use Followed by cardiology  Essential hypertension At goal; continue medications: diltiazem 180 mg every day, labetalol 100 mg every day Monitor blood pressure at home; call if consistently over 130/80 Continue DASH diet.   Reminder to go to the ER if any CP, SOB, nausea, dizziness, severe HA, changes vision/speech, left arm numbness and tingling and jaw pain.  MVP (mitral valve prolapse) Continue BB, monitor.   Raynaud's disease without gangrene No complications, Monitor  Chronic obstructive pulmonary disease, unspecified COPD type (HCC) By imaging; denies notable symptoms of dyspnea, secretions, continue to monitor. Remote hx of smoking.   OSA on CPAP Reports typically wears 100% of the time and endorses restorative sleep  Gastroesophageal reflux disease, esophagitis presence not specified Continue Protonix 40 mg bid Encouraged follow up with GI to address need for high dose PPI, may need EGD Discussed diet, avoiding triggers and other lifestyle changes - Magnesium  Abnormal glucose Has been prediabetic; most recent A1C at goal Discussed disease and risks Discussed diet/exercise, weight management    Mixed hyperlipidemia At goal; continue medication: Vytorin 10/20 mg every day  Continue low cholesterol diet and exercise.  -     Lipid panel  Vitamin D deficiency At goal at recent check; continue to recommend supplementation for goal of  70-100 Defer vitamin D level  Osteopenia of left forearm DEXA 2023, followed by Dr. 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 Deno Etienne and doing well with pessary; discussed pelvic floor therapy; will follow up with uro/gyn  Medication management -     CBC with Differential/Platelet -     COMPLETE METABOLIC PANEL WITH GFR -     Magnesium -     Lipid panel -     TSH -     Hemoglobin A1C w/out eAG -     VITAMIN D 25 Hydroxy (Vit-D Deficiency, Fractures) -     Urinalysis, Routine w reflex microscopic -     Microalbumin / creatinine urine ratio  Screening for hematuria or proteinuria -     Urinalysis, Routine w reflex microscopic -     Microalbumin / creatinine urine ratio  Screening for thyroid disorder -     TSH     Over 40 minutes of exam, counseling, chart review and critical decision making was performed Future Appointments  Date Time Provider Department Center  02/17/2023  2:00 PM [facility] 3 [facility]  05/03/2023  2:30 PM Raynelle Dick, NP [facility] None  08/04/2023  2:30 PM [name], MD [facility] None  02/01/2024  2:00 PM Raynelle Dick, NP [facility] None      Subjective:  Jasmine Reeves is a 82 y.o. female who presents for CPE and 3 month follow up.    She has moved to  [facility] from [facility]   She did have a bad stomach virus several months ago but symptoms have completely resolved.   Follows annually with Dr. Jennette Kettle for mammograms and PAPs. Had known cystocele/rectocele, had surgical repair 03/12/2019 but unfortunately with repeated prolapse, was referred for second opinion to [facility] Dr. Louis Matte, was fitted for  pessary. Is not currently using pessary, had some issues with fit of pessary- she are continues to follow with Dr. Ashley Royalty  The Abdominal CT in Dec 2021 also  showed 2 cysts in the pancreatic tail, patient was seen by Dr. Whitney Post, [facility] surgical oncologist and had an Abd MRI which showed 1 cyst - 1.6 cm. and recommendations were to "Reimage q59mo x 4, then q1y x 2, then q2y x 3 -STOP if stable over 10 years ".Last MRI was 10/02/21 at [facility]- was released from care and no further imaging required.   Patient had compression fracture T12 in 01/2020, has seen Dr. 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 She had follow up DEXA 04/2021 which shows osteopenia. Last visit with Dr. Ophelia Charter was 08/11/21- to complete full year of Fosamax and recheck DEXA 2025. Currently on no medication for osteoporosis, was to start Boniva but she never started it. She has been having right knee pain and it will give out at times- using cane for walking. She needs to have an appointment scheduled with Dr Ophelia Charter  she has a diagnosis of GERD which is currently managed by protonix 40 mg BID, carafate PRN due to breathrough. Last colonoscopy was 2014, was to have repeat in 2019 but did not have done due to Covid restrictions that occurred. Last colonoscopy 05/25/22 with Dr. Dulce Sellar- no further needed.   BMI is Body mass index is 26.52 kg/m., she has been working on diet, not exercising as much- Dr. Ophelia Charter wants her to walk 2 miles a day Wt Readings from Last 3 Encounters:  01/31/23 145 lb (65.8 kg)  10/26/22 150 lb 9.6 oz (68.3 kg)  07/19/22 147 lb 6.4 oz (66.9 kg)   She has hx of PCA x 2 In Nov & Dec 2001, and CABG in Jan 2002. , she underwent CABG. Had reassuring myocardial perfusion and ECHO in 12/2018 other than known MVP and mild aortic stenosis Continues to follow up with Dr. Tresa Endo. Last visit was 05/19/22  She has aortic atherosclerosis and COPD per CXR in 2018/2019 Her blood pressure has been controlled at home with  diltiazem 180 mg every day, labetalol 100 mg every day , today their BP is BP: 104/62 . The diltiazem for Raynauds.  BP Readings from Last 3 Encounters:  01/31/23 104/62  10/26/22 122/72  07/19/22 118/60  She does not workout. She denies chest pain, shortness of breath, dizziness.    She is on cholesterol medication (vytorin 10/20) and denies myalgias. Her cholesterol is at goal. She does limit red meat. The cholesterol last visit was:   Lab Results  Component Value Date   CHOL 149 10/26/2022   HDL 68 10/26/2022   LDLCALC 58 10/26/2022   TRIG 156 (H) 10/26/2022   CHOLHDL 2.2 10/26/2022    She has been working on diet and exercise for glucose management, and denies increased appetite, nausea, paresthesia of the feet, polydipsia, polyuria, visual disturbances, vomiting and weight loss. Last A1C in the office was:  Lab Results  Component Value Date   HGBA1C 6.1 (H) 07/19/2022   Last GFR demonstrates stage 2 ckd: Lab Results  Component  Value Date   EGFR 89 10/26/2022    Patient is on Vitamin D supplement and at goal at most recent check:  Lab Results  Component Value Date   VD25OH 73 07/19/2022      Medication Review: Current Outpatient Medications on File Prior to Visit  Medication Sig Dispense Refill   albuterol (VENTOLIN HFA) 108 (90 Base) MCG/ACT inhaler Inhale 2 puffs into the lungs every 6 (six) hours as needed for wheezing or shortness of breath. 8 g 2   aspirin 81 MG tablet Take 81 mg by mouth daily.     Cholecalciferol (VITAMIN D) 2000 UNITS tablet Take 2,000 Units by mouth 2 (two) times daily.     diltiazem (DILT-XR) 180 MG 24 hr capsule TAKE 1 CAPSULE BY MOUTH DAILY 90 capsule 3   estradiol (ESTRACE) 0.1 MG/GM vaginal cream estradiol 0.01% (0.1 mg/gram) vaginal cream  INSERT 2 GRAMS INTO THE VAGINA DAILY FOR FIRST WEEK THEN TWICE WEEKLY AFTER FIRST WEEK     ezetimibe-simvastatin (VYTORIN) 10-20 MG tablet TAKE 1 TABLET BY MOUTH DAILY FOR CHOLESTEROL 90 tablet 1    fexofenadine (ALLEGRA) 180 MG tablet Take 180 mg by mouth daily.     Fluticasone Propionate (FLONASE NA) Place into the nose.     IRON PO Take 50 mg by mouth daily.     labetalol (NORMODYNE) 100 MG tablet TAKE 1 TABLET(100 MG) BY MOUTH AT BEDTIME 90 tablet 1   Magnesium 400 MG CAPS Take by mouth daily.     meloxicam (MOBIC) 15 MG tablet Take 1/2 to 1 tablet Daily with Food  for Pain & Inflammation 90 tablet 3   Multiple Vitamins-Minerals (MULTIVITAMIN WITH MINERALS) tablet Take 1 tablet by mouth daily.     nitroGLYCERIN (NITROSTAT) 0.4 MG SL tablet Place 1 tablet (0.4 mg total) under the tongue every 5 (five) minutes as needed for chest pain. 25 tablet 12   pantoprazole (PROTONIX) 40 MG tablet TAKE 1 TABLET BY MOUTH TWICE DAILY(EVERY 12 HOURS) TO PREVENT HEARTBURN AND INDIGESTION 180 tablet 3   vitamin C (ASCORBIC ACID) 500 MG tablet Take 500 mg by mouth daily.     Zinc 50 MG TABS Take by mouth daily.     cetirizine (ZYRTEC) 10 MG tablet Take 10 mg by mouth daily. (Patient not taking: Reported on 10/26/2022)     fluticasone (VERAMYST) 27.5 MCG/SPRAY nasal spray Place 2 sprays into the nose daily as needed for rhinitis. (Patient not taking: Reported on 01/31/2023) 10 g 8   ibandronate (BONIVA) 150 MG tablet Take  1 tablet every month (every 30 days) in the morning with a full glass of water, on an empty stomach, and do not take anything else by mouth or lie down for the next 30 min. ( Replaces Fosamax) (Patient not taking: Reported on 01/31/2023) 3 tablet 3   No current facility-administered medications on file prior to visit.    Allergies  Allergen Reactions   Altace [Ramipril]     cough   Azithromycin     palpitation   Celebrex [Celecoxib]     Gi upset    Hydrochlorothiazide     cramping   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   Prilosec [Omeprazole]     constipation   Tape Other (See Comments)   Tapentadol     Other reaction(s): Other (See Comments),  redness & itch    Current Problems (verified) Patient Active Problem List   Diagnosis Date Noted  Aortic atherosclerosis (HCC) by Abd CT scan on 02/16/2020 05/11/2020   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   Compression fracture of T12 vertebra (HCC) 02/21/2020   Cystocele with rectocele 12/13/2018   BMI 23.0-23.9, adult 11/01/2017   Osteoporosis with current pathological fracture 04/12/2017   OSA on CPAP 11/27/2014   IBS (irritable bowel syndrome) 11/11/2014   Encounter for Medicare annual wellness exam 11/11/2014   Medication management 04/12/2014   Abnormal blood sugar 10/10/2013   Mixed hyperlipidemia 10/08/2013   CAD (coronary artery disease) 02/03/2013   MVP (mitral valve prolapse) 02/03/2013   Vitamin D deficiency    GERD (gastroesophageal reflux disease)    COPD (chronic obstructive pulmonary disease) (HCC) 02/11/2010   Essential hypertension 01/28/2010   RAYNAUDS SYNDROME 01/28/2010    Screening Tests Immunization History  Administered Date(s) Administered   Fluad Quad(high Dose 65+) 11/16/2021   Influenza Inj Mdck Quad Pf 11/29/2021   Influenza, High Dose Seasonal PF 11/11/2014, 12/08/2015   Influenza,inj,Quad PF,6+ Mos 11/27/2016, 11/20/2017, 11/14/2018, 11/11/2020   Influenza-Unspecified 11/20/2019   PFIZER(Purple Top)SARS-COV-2 Vaccination 04/03/2019, 04/26/2019   Pneumococcal Conjugate-13 04/16/2014   Pneumococcal-Unspecified 03/01/1998, 03/12/2010   Td 03/27/2012   Tdap 02/10/2018   Zoster, Live 03/02/2007   Health Maintenance  Topic Date Due   Zoster Vaccines- Shingrix (1 of 2) 10[birth year]09/1961   Pneumonia Vaccine 13+ Years old (2 of 2 - PPSV23 or PCV20) 04/17/2015   COVID-19 Vaccine (3 - Pfizer risk series) 05/24/2019   Colonoscopy  01/03/2020   INFLUENZA VACCINE  09/30/2022   Diabetic kidney evaluation - Urine ACR  12/04/2022   HEMOGLOBIN A1C  01/19/2023   OPHTHALMOLOGY EXAM  04/08/2023   Medicare Annual Wellness (AWV)  04/16/2023   Diabetic kidney evaluation -  eGFR measurement  10/26/2023   DTaP/Tdap/Td (3 - Td or Tdap) 02/11/2028   DEXA SCAN  Completed   HPV VACCINES  Aged Out   FOOT EXAM  Discontinued      Names of Other Physician/Practitioners you currently use: 1. [facility] here for primary care 2. Dr. Dione Booze, eye doctor, last visit 04/07/22 3. Ribendo, dentist, 07/2022 4. Dr. Andi Devon, derm, appointment coming up 2024  Patient Care Team: [name], MD as PCP - General (Internal Medicine) Lennette Bihari, MD as PCP - Cardiology (Cardiology) Lennette Bihari, MD as Consulting Physician (Cardiology) Bernette Redbird, MD as Consulting Physician (Gastroenterology) Freddy Finner, MD (Inactive) as Consulting Physician (Obstetrics and Gynecology)  SURGICAL HISTORY She  has a past surgical history that includes Tonsillectomy; Appendectomy; Abdominal hysterectomy; Tubal ligation; Coronary artery bypass graft; Coronary angioplasty (12/11); Esophagogastroduodenoscopy (07/21/12); Cystocele repair (03/12/2019); and Breast biopsy (Left, 04/23/2022). FAMILY HISTORY Her family history includes Heart disease in her brother; Hyperlipidemia in her brother. SOCIAL HISTORY She  reports that she quit smoking about 57 years ago. Her smoking use included cigarettes. She has never used smokeless tobacco. She reports current alcohol use of about 3.0 standard drinks of alcohol per week. She reports that she does not use drugs.   Review of Systems  Constitutional:  Negative for chills, fever, malaise/fatigue and weight loss.  HENT:  Negative for congestion, hearing loss, sore throat and tinnitus.   Eyes:  Negative for blurred vision and double vision.  Respiratory:  Negative for cough, sputum production, shortness of breath and wheezing.   Cardiovascular:  Negative for chest pain, palpitations, orthopnea, claudication and leg swelling.  Gastrointestinal:  Positive for heartburn (controlled with protonix bID). Negative  for abdominal pain, blood in stool, constipation,  diarrhea, melena, nausea and vomiting.  Genitourinary:  Negative for dysuria, frequency (improved), hematuria and urgency.       Perineal discomfort following cystocele surgery, was fitted for pessary but caused issues so not wearing.   Musculoskeletal:  Positive for back pain (chronic, mid back, compression fracture) and joint pain (right knee). Negative for falls and myalgias.  Skin:  Negative for rash.  Neurological:  Negative for dizziness, tingling, sensory change, weakness and headaches.  Endo/Heme/Allergies:  Negative for environmental allergies and polydipsia.  Psychiatric/Behavioral: Negative.  Negative for depression, memory loss and substance abuse. The patient is not nervous/anxious and does not have insomnia.   All other systems reviewed and are negative.    Objective:     Today's Vitals   01/31/23 1415  BP: 104/62  Pulse: 70  Temp: 97.7 F (36.5 C)  SpO2: 97%  Weight: 145 lb (65.8 kg)  Height: 5\' 2"  (1.575 m)    Body mass index is 26.52 kg/m.  General appearance: alert, no distress, WD/WN, female HEENT: normocephalic, sclerae anicteric, TMs pearly, nares patent, no discharge or erythema, pharynx normal Oral cavity: MMM, no lesions Neck: supple, no lymphadenopathy, no thyromegaly, no masses Heart: RRR, normal S1, S2, no murmurs Lungs: CTA bilaterally, no wheezes, rhonchi, or rales Abdomen: +bs, soft, rounded, non tender, no masses, no hepatomegaly, no splenomegaly Musculoskeletal: nontender, no swelling, kyphoscoliosis Extremities: no edema, no cyanosis, no clubbing. Right knee crepitus Pulses: 2+ symmetric, upper and lower extremities, normal cap refill Neurological: alert, oriented x 3, CN2-12 intact, strength normal upper extremities and lower extremities, sensation normal throughout, DTRs 2+ throughout, no cerebellar signs, slow antalgic gait Psychiatric: normal affect, behavior normal, pleasant  NFA:[id number] to  cardiology   Raynelle Dick, NP   01/31/2023

## 2023-01-31 ENCOUNTER — Ambulatory Visit (INDEPENDENT_AMBULATORY_CARE_PROVIDER_SITE_OTHER): Payer: Medicare PPO | Admitting: Nurse Practitioner

## 2023-01-31 ENCOUNTER — Encounter: Payer: Self-pay | Admitting: Nurse Practitioner

## 2023-01-31 VITALS — BP 104/62 | HR 70 | Temp 97.7°F | Ht 62.0 in | Wt 145.0 lb

## 2023-01-31 DIAGNOSIS — R7309 Other abnormal glucose: Secondary | ICD-10-CM

## 2023-01-31 DIAGNOSIS — K219 Gastro-esophageal reflux disease without esophagitis: Secondary | ICD-10-CM

## 2023-01-31 DIAGNOSIS — J449 Chronic obstructive pulmonary disease, unspecified: Secondary | ICD-10-CM

## 2023-01-31 DIAGNOSIS — I10 Essential (primary) hypertension: Secondary | ICD-10-CM | POA: Diagnosis not present

## 2023-01-31 DIAGNOSIS — E782 Mixed hyperlipidemia: Secondary | ICD-10-CM

## 2023-01-31 DIAGNOSIS — I7300 Raynaud's syndrome without gangrene: Secondary | ICD-10-CM

## 2023-01-31 DIAGNOSIS — Z1329 Encounter for screening for other suspected endocrine disorder: Secondary | ICD-10-CM

## 2023-01-31 DIAGNOSIS — M810 Age-related osteoporosis without current pathological fracture: Secondary | ICD-10-CM

## 2023-01-31 DIAGNOSIS — Z1389 Encounter for screening for other disorder: Secondary | ICD-10-CM

## 2023-01-31 DIAGNOSIS — Z0001 Encounter for general adult medical examination with abnormal findings: Secondary | ICD-10-CM

## 2023-01-31 DIAGNOSIS — G4733 Obstructive sleep apnea (adult) (pediatric): Secondary | ICD-10-CM

## 2023-01-31 DIAGNOSIS — Z79899 Other long term (current) drug therapy: Secondary | ICD-10-CM

## 2023-01-31 DIAGNOSIS — I700 Atherosclerosis of aorta: Secondary | ICD-10-CM

## 2023-01-31 DIAGNOSIS — Z0000 Encounter for general adult medical examination without abnormal findings: Secondary | ICD-10-CM | POA: Diagnosis not present

## 2023-01-31 DIAGNOSIS — I25118 Atherosclerotic heart disease of native coronary artery with other forms of angina pectoris: Secondary | ICD-10-CM

## 2023-01-31 DIAGNOSIS — I341 Nonrheumatic mitral (valve) prolapse: Secondary | ICD-10-CM

## 2023-01-31 DIAGNOSIS — E559 Vitamin D deficiency, unspecified: Secondary | ICD-10-CM | POA: Diagnosis not present

## 2023-01-31 DIAGNOSIS — Z136 Encounter for screening for cardiovascular disorders: Secondary | ICD-10-CM

## 2023-01-31 DIAGNOSIS — K862 Cyst of pancreas: Secondary | ICD-10-CM

## 2023-01-31 NOTE — Patient Instructions (Signed)

## 2023-02-01 LAB — CBC WITH DIFFERENTIAL/PLATELET
Absolute Lymphocytes: 1562 {cells}/uL (ref 850–3900)
Absolute Monocytes: 555 {cells}/uL (ref 200–950)
Basophils Absolute: 61 {cells}/uL (ref 0–200)
Basophils Relative: 1 %
Eosinophils Absolute: 244 {cells}/uL (ref 15–500)
Eosinophils Relative: 4 %
HCT: 40.4 % (ref 35.0–45.0)
Hemoglobin: 13.2 g/dL (ref 11.7–15.5)
MCH: 30.4 pg (ref 27.0–33.0)
MCHC: 32.7 g/dL (ref 32.0–36.0)
MCV: 93.1 fL (ref 80.0–100.0)
MPV: 10 fL (ref 7.5–12.5)
Monocytes Relative: 9.1 %
Neutro Abs: 3678 {cells}/uL (ref 1500–7800)
Neutrophils Relative %: 60.3 %
Platelets: 200 10*3/uL (ref 140–400)
RBC: 4.34 10*6/uL (ref 3.80–5.10)
RDW: 11.9 % (ref 11.0–15.0)
Total Lymphocyte: 25.6 %
WBC: 6.1 10*3/uL (ref 3.8–10.8)

## 2023-02-01 LAB — COMPLETE METABOLIC PANEL WITH GFR
AG Ratio: 1.8 (calc) (ref 1.0–2.5)
ALT: 24 U/L (ref 6–29)
AST: 24 U/L (ref 10–35)
Albumin: 4.6 g/dL (ref 3.6–5.1)
Alkaline phosphatase (APISO): 69 U/L (ref 37–153)
BUN: 22 mg/dL (ref 7–25)
CO2: 28 mmol/L (ref 20–32)
Calcium: 9.2 mg/dL (ref 8.6–10.4)
Chloride: 106 mmol/L (ref 98–110)
Creat: 0.71 mg/dL (ref 0.60–0.95)
Globulin: 2.5 g/dL (ref 1.9–3.7)
Glucose, Bld: 83 mg/dL (ref 65–99)
Potassium: 4.1 mmol/L (ref 3.5–5.3)
Sodium: 140 mmol/L (ref 135–146)
Total Bilirubin: 0.5 mg/dL (ref 0.2–1.2)
Total Protein: 7.1 g/dL (ref 6.1–8.1)
eGFR: 86 mL/min/{1.73_m2} (ref >=60)

## 2023-02-01 LAB — LIPID PANEL
Cholesterol: 148 mg/dL (ref <200)
HDL: 68 mg/dL (ref >=50)
LDL Cholesterol (Calc): 63 mg/dL
Non-HDL Cholesterol (Calc): 80 mg/dL (ref <130)
Total CHOL/HDL Ratio: 2.2 (calc) (ref <5.0)
Triglycerides: 89 mg/dL (ref <150)

## 2023-02-01 LAB — VITAMIN D 25 HYDROXY (VIT D DEFICIENCY, FRACTURES): Vit D, 25-Hydroxy: 78 ng/mL (ref 30–100)

## 2023-02-01 LAB — HEMOGLOBIN A1C W/OUT EAG: Hgb A1c MFr Bld: 6 %{Hb} — ABNORMAL HIGH (ref <5.7)

## 2023-02-01 LAB — MAGNESIUM: Magnesium: 2.2 mg/dL (ref 1.5–2.5)

## 2023-02-01 LAB — TSH: TSH: 3.49 m[IU]/L (ref 0.40–4.50)

## 2023-02-07 ENCOUNTER — Encounter: Payer: Self-pay | Admitting: Orthopaedic Surgery

## 2023-02-07 ENCOUNTER — Ambulatory Visit: Payer: Medicare PPO | Admitting: Orthopaedic Surgery

## 2023-02-07 ENCOUNTER — Other Ambulatory Visit (INDEPENDENT_AMBULATORY_CARE_PROVIDER_SITE_OTHER): Payer: Self-pay

## 2023-02-07 VITALS — BP 123/73 | HR 62 | Ht 62.0 in | Wt 145.0 lb

## 2023-02-07 DIAGNOSIS — S22080S Wedge compression fracture of T11-T12 vertebra, sequela: Secondary | ICD-10-CM | POA: Diagnosis not present

## 2023-02-07 DIAGNOSIS — G8929 Other chronic pain: Secondary | ICD-10-CM

## 2023-02-07 DIAGNOSIS — M25561 Pain in right knee: Secondary | ICD-10-CM

## 2023-02-07 NOTE — Progress Notes (Unsigned)
Office Visit Note   Patient: Jasmine Reeves           Date of Birth: Oct 22, 1940           MRN: [id number] Visit Date: 02/07/2023              Requested by: [name], MD [address] [place],  [place] [address] PCP: [name], MD   Assessment & Plan: Visit Diagnoses:  1. Chronic pain of right knee   2. Compression fracture of T12 vertebra, sequela     Plan: Patient has some knee arthritis not severe enough to consider surgery at this point.  Continue ambulation if she develops locking or recurrent swelling or increased problems she will call.  Will set up for some physical therapy at [facility] where she resides and she states [facility] is available onsite help with therapy.  Will work on core strengthening quad strengthening balance and ambulation.  She will follow-up with me in 8 weeks.  Follow-Up Instructions: Return in about 8 years (around 02/07/2031).   Orders:  Orders Placed This Encounter  Procedures   XR KNEE 3 VIEW RIGHT   Ambulatory referral to Physical Therapy   No orders of the defined types were placed in this encounter.     Procedures: No procedures performed   Clinical Data: No additional findings.   Subjective: Chief Complaint  Patient presents with   Right Knee - Pain    HPI 82 year old female with history of T12 compression fracture, COPD.  IBS seen with right knee pain.  She has mild valgus 10 degrees has been ambulatory no locking or falling.  Patient has been on calcium and vitamin D.  She uses a cane when she goes out of her house which helps stability.  Patient said pain that radiates into her hip region she does have a history of right foot arthritis.  Degenerative scoliosis history of compression fracture sometimes some right groin pain but no problems with figure 4 putting her sock on.  Review of Systems 14 point system update unchanged.   Objective: Vital Signs: BP 123/73   Pulse 62   Ht 5\' 2"  (1.575  m)   Wt 145 lb (65.8 kg)   BMI 26.52 kg/m   Physical Exam Constitutional:      Appearance: She is well-developed.  HENT:     Head: Normocephalic.     Right Ear: External ear normal.     Left Ear: External ear normal. There is no impacted cerumen.  Eyes:     Pupils: Pupils are equal, round, and reactive to light.  Neck:     Thyroid: No thyromegaly.     Trachea: No tracheal deviation.  Cardiovascular:     Rate and Rhythm: Normal rate.  Pulmonary:     Effort: Pulmonary effort is normal.  Abdominal:     Palpations: Abdomen is soft.  Musculoskeletal:     Cervical back: No rigidity.  Skin:    General: Skin is warm and dry.  Neurological:     Mental Status: She is alert and oriented to person, place, and time.  Psychiatric:        Behavior: Behavior normal.     Ortho Exam mild lateral joint line tenderness no Baker's cyst.  Collateral ligaments are stable LCL PCL exam is normal.  Negative logroll hips distal pulses intact.  She is able to ambulate without limping mild clinical valgus noted right knee only.  Left knee appears straight.  Crepitus with patellofemoral loading and knee extension.  Specialty Comments:  No specialty comments available.  Imaging: XR KNEE 3 VIEW RIGHT  Result Date: 02/08/2023 Standing AP both knees lateral right knee sunrise patella x-ray obtained and reviewed.  Right knee shows no mild valgus.  Vascular clips adjacent saphenous vein region.  Patellofemoral joint narrowing and mild lateral more than medial joint line narrowing noted. Impression: Mild right knee osteoarthritis with valgus.    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   Compression fracture of T12 vertebra (HCC) 02/21/2020   Cystocele with rectocele 12/13/2018   BMI 23.0-23.9, adult 11/01/2017   Osteoporosis with current pathological fracture 04/12/2017   OSA  on CPAP 11/27/2014   IBS (irritable bowel syndrome) 11/11/2014   Encounter for Medicare annual wellness exam 11/11/2014   Medication management 04/12/2014   Abnormal blood sugar 10/10/2013   Mixed hyperlipidemia 10/08/2013   CAD (coronary artery disease) 02/03/2013   MVP (mitral valve prolapse) 02/03/2013   Vitamin D deficiency    GERD (gastroesophageal reflux disease)    COPD (chronic obstructive pulmonary disease) (HCC) 02/11/2010   Essential hypertension 01/28/2010   RAYNAUDS SYNDROME 01/28/2010   Past Medical History:  Diagnosis Date   COPD (chronic obstructive pulmonary disease) (HCC)    Fibromyalgia    GERD (gastroesophageal reflux disease)    IBS (irritable bowel syndrome)    Scoliosis     Family History  Problem Relation Age of Onset   Heart disease Brother    Hyperlipidemia Brother    Breast cancer Neg Hx     Past Surgical History:  Procedure Laterality Date   ABDOMINAL HYSTERECTOMY     APPENDECTOMY     BREAST BIOPSY Left 04/23/2022   [place] LT BREAST BX W LOC DEV 1ST LESION IMG BX SPEC US GUIDE 04/23/2022 [facility]   CORONARY ANGIOPLASTY  12/11   CORONARY ARTERY BYPASS GRAFT     CYSTOCELE REPAIR  03/12/2019   cystocele and rectocele repair, Dr. Jennette Kettle   ESOPHAGOGASTRODUODENOSCOPY  07/21/12   polyps, Hiatal hernia Dr. Matthias Hughs   TONSILLECTOMY     TUBAL LIGATION     Social History   Occupational History   Not on file  Tobacco Use   Smoking status: Former    Current packs/day: 0.00    Types: Cigarettes    Quit date: 09/03/1965    Years since quitting: 57.4   Smokeless tobacco: Never  Substance and Sexual Activity   Alcohol use: Yes    Alcohol/week: 3.0 standard drinks of alcohol    Types: 3 Standard drinks or equivalent per week    Comment: rare   Drug use: No   Sexual activity: Not on file

## 2023-02-08 DIAGNOSIS — Z1389 Encounter for screening for other disorder: Secondary | ICD-10-CM | POA: Diagnosis not present

## 2023-02-08 DIAGNOSIS — I10 Essential (primary) hypertension: Secondary | ICD-10-CM | POA: Diagnosis not present

## 2023-02-08 DIAGNOSIS — E782 Mixed hyperlipidemia: Secondary | ICD-10-CM | POA: Diagnosis not present

## 2023-02-08 DIAGNOSIS — K219 Gastro-esophageal reflux disease without esophagitis: Secondary | ICD-10-CM | POA: Diagnosis not present

## 2023-02-08 DIAGNOSIS — R7309 Other abnormal glucose: Secondary | ICD-10-CM | POA: Diagnosis not present

## 2023-02-08 DIAGNOSIS — Z1283 Encounter for screening for malignant neoplasm of skin: Secondary | ICD-10-CM | POA: Diagnosis not present

## 2023-02-08 DIAGNOSIS — D225 Melanocytic nevi of trunk: Secondary | ICD-10-CM | POA: Diagnosis not present

## 2023-02-08 DIAGNOSIS — E559 Vitamin D deficiency, unspecified: Secondary | ICD-10-CM | POA: Diagnosis not present

## 2023-02-08 DIAGNOSIS — Z1329 Encounter for screening for other suspected endocrine disorder: Secondary | ICD-10-CM | POA: Diagnosis not present

## 2023-02-08 DIAGNOSIS — Z79899 Other long term (current) drug therapy: Secondary | ICD-10-CM | POA: Diagnosis not present

## 2023-02-09 LAB — URINALYSIS, ROUTINE W REFLEX MICROSCOPIC
Bilirubin Urine: NEGATIVE
Glucose, UA: NEGATIVE
Hgb urine dipstick: NEGATIVE
Ketones, ur: NEGATIVE
Leukocytes,Ua: NEGATIVE
Nitrite: NEGATIVE
Protein, ur: NEGATIVE
Specific Gravity, Urine: 1.012 (ref 1.001–1.035)
pH: 7 (ref 5.0–8.0)

## 2023-02-09 LAB — MICROALBUMIN / CREATININE URINE RATIO
Creatinine, Urine: 47 mg/dL (ref 20–275)
Microalb Creat Ratio: 4 mg/g{creat} (ref <30)
Microalb, Ur: 0.2 mg/dL

## 2023-02-17 ENCOUNTER — Ambulatory Visit
Admission: RE | Admit: 2023-02-17 | Discharge: 2023-02-17 | Disposition: A | Discharge status: Home / Self Care | Payer: Medicare PPO | Source: Ambulatory Visit | Attending: Internal Medicine | Admitting: Internal Medicine

## 2023-02-17 DIAGNOSIS — Z1231 Encounter for screening mammogram for malignant neoplasm of breast: Secondary | ICD-10-CM | POA: Diagnosis not present

## 2023-02-17 DIAGNOSIS — Z0000 Encounter for general adult medical examination without abnormal findings: Secondary | ICD-10-CM

## 2023-03-21 DIAGNOSIS — M25561 Pain in right knee: Secondary | ICD-10-CM | POA: Diagnosis not present

## 2023-03-21 DIAGNOSIS — M25552 Pain in left hip: Secondary | ICD-10-CM | POA: Diagnosis not present

## 2023-03-21 DIAGNOSIS — R2681 Unsteadiness on feet: Secondary | ICD-10-CM | POA: Diagnosis not present

## 2023-03-22 ENCOUNTER — Ambulatory Visit: Payer: Medicare PPO | Admitting: Orthopaedic Surgery

## 2023-03-24 DIAGNOSIS — M25561 Pain in right knee: Secondary | ICD-10-CM | POA: Diagnosis not present

## 2023-03-24 DIAGNOSIS — R2681 Unsteadiness on feet: Secondary | ICD-10-CM | POA: Diagnosis not present

## 2023-03-24 DIAGNOSIS — M25552 Pain in left hip: Secondary | ICD-10-CM | POA: Diagnosis not present

## 2023-03-26 ENCOUNTER — Other Ambulatory Visit: Payer: Self-pay | Admitting: Nurse Practitioner

## 2023-03-26 DIAGNOSIS — I10 Essential (primary) hypertension: Secondary | ICD-10-CM

## 2023-03-26 DIAGNOSIS — E782 Mixed hyperlipidemia: Secondary | ICD-10-CM

## 2023-03-28 DIAGNOSIS — R2681 Unsteadiness on feet: Secondary | ICD-10-CM | POA: Diagnosis not present

## 2023-03-28 DIAGNOSIS — M25561 Pain in right knee: Secondary | ICD-10-CM | POA: Diagnosis not present

## 2023-03-28 DIAGNOSIS — M25552 Pain in left hip: Secondary | ICD-10-CM | POA: Diagnosis not present

## 2023-03-31 DIAGNOSIS — R2681 Unsteadiness on feet: Secondary | ICD-10-CM | POA: Diagnosis not present

## 2023-03-31 DIAGNOSIS — M25561 Pain in right knee: Secondary | ICD-10-CM | POA: Diagnosis not present

## 2023-03-31 DIAGNOSIS — M25552 Pain in left hip: Secondary | ICD-10-CM | POA: Diagnosis not present

## 2023-05-03 ENCOUNTER — Ambulatory Visit: Payer: Medicare PPO | Admitting: Nurse Practitioner

## 2023-05-23 ENCOUNTER — Encounter: Payer: Self-pay | Admitting: Family Medicine

## 2023-05-23 ENCOUNTER — Ambulatory Visit: Payer: Medicare PPO | Admitting: Family Medicine

## 2023-05-23 VITALS — BP 122/68 | HR 81 | Temp 98.2°F | Ht 62.0 in | Wt 143.2 lb

## 2023-05-23 DIAGNOSIS — I25118 Atherosclerotic heart disease of native coronary artery with other forms of angina pectoris: Secondary | ICD-10-CM | POA: Diagnosis not present

## 2023-05-23 DIAGNOSIS — M810 Age-related osteoporosis without current pathological fracture: Secondary | ICD-10-CM

## 2023-05-23 DIAGNOSIS — J3089 Other allergic rhinitis: Secondary | ICD-10-CM | POA: Insufficient documentation

## 2023-05-23 DIAGNOSIS — E782 Mixed hyperlipidemia: Secondary | ICD-10-CM | POA: Diagnosis not present

## 2023-05-23 DIAGNOSIS — I10 Essential (primary) hypertension: Secondary | ICD-10-CM

## 2023-05-23 NOTE — Assessment & Plan Note (Signed)
 At goal. Continue ezetimibe-simvastatin (Vytorin) 10-20 mg daily.

## 2023-05-23 NOTE — Assessment & Plan Note (Signed)
 Jasmine Reeves is due for a bone density test. I will order this. I recommend she start to take her ibandronate 150 mg monthly.

## 2023-05-23 NOTE — Progress Notes (Signed)
 [facility] [address] [place] [address] Dept: [phone] Dept Fax: [phone]  New Patient Office Visit  Subjective:    Patient ID: Jasmine Reeves, female    DOB: 23-Dec-1940, 83 y.o..   MRN: [id number]  Chief Complaint  Patient presents with   Establish Care    NP- establish care.  Wants bone density done (>2 yrs)   History of Present Illness:  Patient is in today to establish care. Jasmine Reeves was born in [place], [place]. She attended college at [employer], majoring in Furniture conservator/restorer. 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 She has been married for 59 years. She has two children: a son (28) and a daughter (54). She has 3 grandchildren. She denies use of tobacco or drugs. She drinks an occasional glass of wine.  Jasmine Reeves has a history of coronary artery disease. She had a quadruple bypass in ~ 2002. She is managed on aspirin 81 mg daily. She denies any recent angina.  Jasmine Reeves has a history of essential hypertension. She is managed on diltiazem XR 180 mg daily and labetalol 100 mg at bedtime. She notes this has also resolved an issue she had with Raynaud's syndrome.  Jasmine Reeves has hyperlipidemia. She is managed on ezetimibe-simvastatin (Vytorin) 10-20 mg daily.  Jasmine Reeves has a history of osteoporosis and a prior lumbar compression fracture. She had been on alendronate, but found the weekly dosing hard to remember. She is now prescribed ibandronate 150 mg monthly, but has not started taking this.  Jasmine Reeves has multiple painful joints. She occasionally takes a meloxicam for pain. She has avoided taking this daily.  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   Compression fracture of T12 vertebra (HCC) 02/21/2020   Scoliosis deformity of spine 02/16/2019   Cystocele with rectocele 12/13/2018   BMI 23.0-23.9, adult 11/01/2017   Osteoporosis 04/12/2017   OSA on CPAP 11/27/2014   IBS (irritable bowel syndrome) 11/11/2014   Abnormal blood sugar 10/10/2013   Mixed hyperlipidemia 10/08/2013   CAD (coronary artery disease) 02/03/2013   MVP (mitral valve prolapse) 02/03/2013   Vitamin D deficiency    GERD (gastroesophageal reflux disease)    COPD (chronic obstructive pulmonary disease) (HCC) 02/11/2010   Essential hypertension 01/28/2010   Raynauds syndrome 01/28/2010   Past Surgical History:  Procedure Laterality Date   ABDOMINAL HYSTERECTOMY     APPENDECTOMY     BREAST BIOPSY Left 04/23/2022   [place] LT BREAST BX W LOC DEV 1ST LESION IMG BX SPEC US GUIDE 04/23/2022 [facility]   CORONARY ANGIOPLASTY  12/11   CORONARY ARTERY BYPASS GRAFT     CYSTOCELE REPAIR  03/12/2019   cystocele and rectocele repair, Dr. Jennette Kettle   ESOPHAGOGASTRODUODENOSCOPY  07/21/12   polyps, Hiatal hernia Dr. Matthias Hughs   TONSILLECTOMY     TUBAL LIGATION     Family History  Problem Relation Age of Onset   Anxiety disorder Father    Depression Father    Diabetes Father    Hearing loss Father    Heart disease Father    Heart disease Brother    Hyperlipidemia Brother    Hypertension Brother    Cancer Son  Breast cancer Neg Hx    Outpatient Medications Prior to Visit  Medication Sig Dispense Refill   albuterol (VENTOLIN HFA) 108 (90 Base) MCG/ACT inhaler Inhale 2 puffs into the lungs every 6 (six) hours as needed for wheezing or shortness of breath. 8 g 2   aspirin 81 MG tablet Take 81 mg by mouth daily.     Cholecalciferol (VITAMIN D) 2000 UNITS tablet Take 2,000 Units by mouth 2 (two) times daily.     DILT-XR 180 MG 24 hr capsule TAKE 1 CAPSULE BY MOUTH DAILY 90 capsule 3   ezetimibe-simvastatin (VYTORIN) 10-20 MG tablet TAKE 1  TABLET BY MOUTH DAILY FOR CHOLESTEROL 90 tablet 1   fexofenadine (ALLEGRA) 180 MG tablet Take 180 mg by mouth daily.     Fluticasone Propionate (FLONASE NA) Place into the nose.     IRON PO Take 50 mg by mouth daily.     labetalol (NORMODYNE) 100 MG tablet TAKE 1 TABLET(100 MG) BY MOUTH AT BEDTIME 90 tablet 1   Magnesium 400 MG CAPS Take by mouth daily.     meloxicam (MOBIC) 15 MG tablet Take 1/2 to 1 tablet Daily with Food  for Pain & Inflammation 90 tablet 3   Multiple Vitamins-Minerals (MULTIVITAMIN WITH MINERALS) tablet Take 1 tablet by mouth daily.     nitroGLYCERIN (NITROSTAT) 0.4 MG SL tablet Place 1 tablet (0.4 mg total) under the tongue every 5 (five) minutes as needed for chest pain. 25 tablet 12   pantoprazole (PROTONIX) 40 MG tablet TAKE 1 TABLET BY MOUTH TWICE DAILY(EVERY 12 HOURS) TO PREVENT HEARTBURN AND INDIGESTION 180 tablet 3   vitamin C (ASCORBIC ACID) 500 MG tablet Take 500 mg by mouth daily.     Zinc 50 MG TABS Take by mouth daily.     cetirizine (ZYRTEC) 10 MG tablet Take 10 mg by mouth daily.     ibandronate (BONIVA) 150 MG tablet Take  1 tablet every month (every 30 days) in the morning with a full glass of water, on an empty stomach, and do not take anything else by mouth or lie down for the next 30 min. ( Replaces Fosamax) (Patient not taking: Reported on 05/23/2023) 3 tablet 3   estradiol (ESTRACE) 0.1 MG/GM vaginal cream estradiol 0.01% (0.1 mg/gram) vaginal cream  INSERT 2 GRAMS INTO THE VAGINA DAILY FOR FIRST WEEK THEN TWICE WEEKLY AFTER FIRST WEEK     No facility-administered medications prior to visit.   Allergies  Allergen Reactions   Altace [Ramipril]     cough   Azithromycin     palpitation   Celebrex [Celecoxib]     Gi upset    Hydrochlorothiazide     cramping   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   Prilosec [Omeprazole]     constipation   Tape Other (See Comments)   Tapentadol     Other reaction(s): Other (See  Comments), redness & itch   Objective:   Today's Vitals   05/23/23 1331  BP: 122/68  Pulse: 81  Temp: 98.2 F (36.8 C)  TempSrc: Temporal  SpO2: 99%  Weight: 143 lb 3.2 oz (65 kg)  Height: 5\' 2"  (1.575 m)   Body mass index is 26.19 kg/m.   General: Well developed, well nourished. No acute distress. Psych: Alert and oriented. Normal mood and affect.  Health Maintenance Due  Topic Date Due   Zoster Vaccines- Shingrix (1 of 2) 10/09/1961   Pneumonia Vaccine 36+ Years old (  2 of 2 - PPSV23 or PCV20) 06/11/2014   OPHTHALMOLOGY EXAM  04/08/2023   Lab Results    Latest Ref Rng & Units 01/31/2023    3:02 PM 10/26/2022    3:45 PM 07/19/2022    2:25 PM  CBC  WBC 3.8 - 10.8 Thousand/uL 6.1  6.5  6.2   Hemoglobin 11.7 - 15.5 g/dL 13.0  86.5  78.4   Hematocrit 35.0 - 45.0 % 40.4  38.9  39.8   Platelets 140 - 400 Thousand/uL 200  209  205        Latest Ref Rng & Units 01/31/2023    3:02 PM 10/26/2022    3:45 PM 07/19/2022    2:25 PM  CMP  Glucose 65 - 99 mg/dL 83  82  96   BUN 7 - 25 mg/dL 22  18  25    Creatinine 0.60 - 0.95 mg/dL 6.96  2.95  2.84   Sodium 135 - 146 mmol/L 140  141  141   Potassium 3.5 - 5.3 mmol/L 4.1  4.1  4.7   Chloride 98 - 110 mmol/L 106  104  105   CO2 20 - 32 mmol/L 28  28  25    Calcium 8.6 - 10.4 mg/dL 9.2  9.5  9.4   Total Protein 6.1 - 8.1 g/dL 7.1  6.7  6.7   Total Bilirubin 0.2 - 1.2 mg/dL 0.5  0.4  0.5   AST 10 - 35 U/L 24  26  24    ALT 6 - 29 U/L 24  25  21     Last lipids Lab Results  Component Value Date   CHOL 148 01/31/2023   HDL 68 01/31/2023   LDLCALC 63 01/31/2023   TRIG 89 01/31/2023   CHOLHDL 2.2 01/31/2023   Last hemoglobin A1c Lab Results  Component Value Date   HGBA1C 6.0 (H) 01/31/2023      Assessment & Plan:   Problem List Items Addressed This Visit       Cardiovascular and Mediastinum   CAD (coronary artery disease)   Stable. No angina. Continue aspirin 81 mg daily.      Essential hypertension   Blood  pressure is in good control. Continue diltiazem XR 180 mg daily and labetalol 100 mg at bedtime        Musculoskeletal and Integument   Osteoporosis - Primary   Ms. Mares is due for a bone density test. I will order this. I recommend she start to take her ibandronate 150 mg monthly.      Relevant Orders   DG Bone Density     Other   Mixed hyperlipidemia   At goal. Continue ezetimibe-simvastatin (Vytorin) 10-20 mg daily.       Return in about 4 months (around 09/22/2023) for Reassessment.   Loyola Mast, MD

## 2023-05-23 NOTE — Assessment & Plan Note (Signed)
 Stable. No angina. Continue aspirin 81 mg daily.

## 2023-05-23 NOTE — Assessment & Plan Note (Signed)
 Blood pressure is in good control. Continue diltiazem XR 180 mg daily and labetalol 100 mg at bedtime

## 2023-06-08 ENCOUNTER — Ambulatory Visit: Payer: Medicare PPO | Admitting: Nurse Practitioner

## 2023-07-07 ENCOUNTER — Ambulatory Visit: Payer: Medicare PPO | Attending: Cardiovascular Disease | Admitting: Cardiovascular Disease

## 2023-07-07 ENCOUNTER — Encounter: Payer: Self-pay | Admitting: Cardiovascular Disease

## 2023-07-07 DIAGNOSIS — Z951 Presence of aortocoronary bypass graft: Secondary | ICD-10-CM

## 2023-07-07 DIAGNOSIS — E785 Hyperlipidemia, unspecified: Secondary | ICD-10-CM | POA: Diagnosis not present

## 2023-07-07 DIAGNOSIS — I350 Nonrheumatic aortic (valve) stenosis: Secondary | ICD-10-CM

## 2023-07-07 DIAGNOSIS — G4733 Obstructive sleep apnea (adult) (pediatric): Secondary | ICD-10-CM

## 2023-07-07 DIAGNOSIS — I7300 Raynaud's syndrome without gangrene: Secondary | ICD-10-CM

## 2023-07-07 DIAGNOSIS — I2510 Atherosclerotic heart disease of native coronary artery without angina pectoris: Secondary | ICD-10-CM

## 2023-07-07 NOTE — Progress Notes (Signed)
 Patient ID: Jasmine Reeves, female   DOB: 12-18-1940, 83 y.o.   MRN: [id number]        HPI: Jasmine Reeves is a 83 y.o. female presents for a 14 month follow cardiology evaluation.  Jasmine Reeves underwent CABG revascularization surgery x4 on 03/02/2000 by Dr. Alva Jewels with a LIMA placed her distal LAD, vein to the second diagonal, sequential vein to 2 marginal vessels arising from the circumflex coronary artery. Her RCA was free of disease and was not bypassed. Additional problems include mitral valve prolapse, Raynaud's disease, history of short bursts of nonsustained tachycardia on treadmill testing with reference to this on beta blocker therapy.,. She did develop edema remotely on amlodipine. She hasa history of documented APCs on Holter imaging which have improved with labetalol .  She underwent a nuclear perfusion study on 09/07/2013.  She was noted to have ST segment changes  on ECG but nuclear imaging showed apical thinning with breast attenuation, without ischemia.  She had normal wall motion.  She denies any awareness of palpitations.  She does have issues with some arthritic symptoms.  She takes diltiazem  180 mg in addition to losartan 25 mg, Normodyne  150 mg twice a day and HCTZ 25 mg.  She is on Vytorin  10/20 for hyperlipidemia.  She tells me she was referred for sleep study which was done in [place] after a home study was abnormal.  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 Epworth sleepiness scale score was increased at 16.  CPAP was recommended but this has not yet been initiated.  The patient was not aware that I also am involved in sleep medicine. I reviewed with her current Medicare guidelines.  Lab work by her primary physician in 2016 revealed fasting glucose of 110.  She had normal renal function.  LFTs were normal.  Lipid studies were excellent with a total cholesterol 145,  triglycerides 57, HDL 62, and LDL 72.  Hemoglobin A1c was minimally increased at 5.8.  TSH was 3.029.  When I  saw her in September 2018, she denied any chest pain.  She had experienced a presyncopal spell on a very hot day and her dose of Cardizem  and losartan were reduced.  She denies any recurrent palpitations.   Her losartan and labetalol  were reduced.  Presently, she is without chest pain.  She denies shortness of breath.  Her blood pressure has tended to be low.  She stopped taking hydrochlorothiazide and is now taking diltiazem  180 mg which is also helpful for her a ray nods phenomena and has only been taking labetalol  100 mg at bedtime and losartan 12.5 mg  in the morning.  She is on CPAP therapy for his obstructive sleep apnea which was diagnosed after a home sleep study in [place].  [employer] is her DME company.  Repeat laboratory by Dr. Cassondra Cliff had shown a total cholesterol 145, HDL 57, triglycerides were increased at 215.  LDL was 60.  I saw her in April 2019.  At that time she was remaining stable.  She not had any symptoms secondary to her Raynaud's phenomena treated with diltiazem .  Her blood pressure was low there was a 10 mm drop in blood pressure from supine to standing and I recommended discontinuance of losartan.  Over the past year and a half, she has done fairly well.  However she has had issues with degenerative scoliosis.  I last saw her on January 15, 2019 at  which time she stated that she had experienced episodes of chest comfort which seem to occur at rest and not with exertion.   She was wondering if these occurred after eating certain foods.  She has noticed some mild ankle swelling.  She has begun to notice more shortness of breath with activity more easily but admits that she has not been as active as she had been doing this Covid pandemic.  She is in need for prolapsed bladder surgery which tentatively is scheduled in on March 12, 2019. She apparently has never seen a  sleep specialist for her sleep apnea and has not been sleeping as well.  She is in need for new supplies and not been using her CPAP recently and would like to reinstitute treatment.  Her DME company is [employer].    Since it had been almost 19 years since her CABG revascularization  I recommended that she undergo a preoperative echo Doppler study and nuclear perfusion study.  The echo Doppler study January 29, 2019 showed normal EF at 60 to 65%.  There was mild aortic stenosis with a mean gradient of 10, and valve area of 1.5 cm.  A nuclear perfusion study on February 05, 2019 was normal without scar or ischemia or ECG changes.  Recent laboratory on December 3 had shown a total cholesterol 152, HDL 64, LDL 74 and triglycerides 59.  I saw her in follow-up of the above studies in December 2020.  I reviewed her preoperative echo Doppler and Lexiscan  study prior to her planned bladder surgery for cystocele which was scheduled for January 2021.  I saw her in March 04, 2020.  At that time she continued to do well from a cardiac standpoint.   Her husband has developed progression of his Alzheimer's disease.  They have recently moved to [facility] independent living.  When she was caring for her husband she apparently sustained a 30% fracture at T12 and went to the emergency room in December 2021.  At that time she also was experiencing hemorrhoidal bleeding.  She subsequently saw Cardwell Bureau, NP at Dr. Melene Sportsman office on February 25, 2020.  Presently she denies any chest pain.  She is to have an MRI.  She has been wearing a back brace.  She denies any Raynaud's symptomatology.  Her blood pressure was well controlled on diltiazem  long-acting 180 mg, labetalol  100 mg at bedtime.  She continues to be on Vytorin  10/20 for hyperlipidemia and LDL cholesterol was 65.  I saw her on March 23, 2021.  Since her prior evaluation she had continued to do well.  She was experiencing some vague nonexertional chest pain  symptomatology.   Oftentimes she notices some right greater than left shoulder issues.  She has noticed some mild shortness of breath.  She has had back issues with compression fracture and scoliosis.  She was using CPAP therapy and has an old ResMed air sense 10 AutoSet unit for her from 2016 which is no longer wireless.  She is meeting compliance standards with average use approximately 6 hours per night.  At 8 cm water pressure AHI is excellent at 1.3 on her most recent download from December 25, 122 through March 23, 2021.  During that evaluation, I recommended she undergo a follow-up echo Doppler study as well as nuclear perfusion study.  On May 11, 2021 an echo Doppler study showed normal LV function with EF 55 to 60%.  There was mild aortic stenosis with a mean gradient of 7  and peak gradient of 13.4.  There was no significant change from her prior evaluation.  A Lexiscan  Myoview  study also done on May 11, 2021 was low risk and showed normal perfusion.  Post-rest EF was 71%.   I saw her on June 17, 2021 at which time she felt well.  She denied any recurrent chest pain or shortness of breath.   She and her husband are now living in independent living.  Her husband's Parkinson's disease is progressing.  She brought her CPAP machine with her today.  A download continues to show excellent compliance.  However usage is only 6 hours and 12 minutes.  At 8 cm set pressure AHI is excellent at 1.8.  During that evaluation, with her CPAP machine being over 63 years old and was no longer wireless she qualified for a new machine and an order was placed for replacement device.  I last saw her on May 19, 2022.  She received a new ResMed AirSense 11 AutoSet unit on January 11, 2022 with [employer] as her DME company.  Download  shows excellent compliance and she has been averaging 6 hours and 53 minutes of use per night.  At a pressure range of 8 to 12 cm of water, AHI was excellent at 0.7 with her 95th  percentile pressure at 9.8 and maximum average pressure 10.5.  Typically she goes to bed at midnight.  I discussed optimal sleep duration at least 7 to 9 hours if at all possible.  She had developed COVID in January 2024.  She denies chest pain or shortness of breath.  She continues to be on diltiazem  180 mg daily in addition to labetalol  100 mg at bedtime for blood pressure and heart rhythm.  She is scheduled to undergo colonoscopy over the next several weeks.  She was having some issues related to scoliosis.  Since I last saw her, [name] has continued to feel well.  She has degenerative scoliosis.  She had previously seen Dr. Cassondra Cliff for primary care and following his unfortunate death, she has established to be seen by Dr. Remo Carls.  She has been caring for her husband and is his primary caregiver with his progressive Parkinson's disease.  They have moved to a new facility at [facility].  She denies any chest pain or shortness of breath.  She is unaware of any palpitations.  She does have spinal compression fracture.  She continues to use CPAP with [employer] as her DME company.  A download was obtained from April 7 through Jul 05, 2023.  CPAP set at a pressure range of 8 to 12 cm.  AHI 0.7 and her 95th percentile pressure is 10.6 with maximum average pressure 11.3.  She presents for evaluation.  Past Medical History:  Diagnosis Date   Arthritis    Blood transfusion without reported diagnosis    Cataract    COPD (chronic obstructive pulmonary disease) (HCC)    Fibromyalgia    GERD (gastroesophageal reflux disease)    Heart murmur    IBS (irritable bowel syndrome)    Osteoporosis    Scoliosis    Sleep apnea     Past Surgical History:  Procedure Laterality Date   ABDOMINAL HYSTERECTOMY     APPENDECTOMY     BREAST BIOPSY Left 04/23/2022   US  LT BREAST BX W LOC DEV 1ST LESION IMG BX SPEC US  GUIDE 04/23/2022 [facility] MAMMOGRAPHY   CORONARY ANGIOPLASTY  12/11   CORONARY ARTERY BYPASS GRAFT  CYSTOCELE REPAIR  03/12/2019   cystocele and rectocele repair, Dr. Andree Kayser   ESOPHAGOGASTRODUODENOSCOPY  07/21/12   polyps, Hiatal hernia Dr. Dellis Fermo   TONSILLECTOMY     TUBAL LIGATION      Allergies  Allergen Reactions   Altace [Ramipril]     cough   Azithromycin     palpitation   Celebrex [Celecoxib]     Gi upset    Hydrochlorothiazide     cramping   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   Prilosec [Omeprazole]     constipation   Tape Other (See Comments)   Tapentadol     Other reaction(s): Other (See Comments), redness & itch    Current Outpatient Medications  Medication Sig Dispense Refill   aspirin 81 MG tablet Take 81 mg by mouth daily.     Cholecalciferol (VITAMIN D ) 2000 UNITS tablet Take 2,000 Units by mouth 2 (two) times daily.     DILT-XR 180 MG 24 hr capsule TAKE 1 CAPSULE BY MOUTH DAILY 90 capsule 3   ezetimibe -simvastatin  (VYTORIN ) 10-20 MG tablet TAKE 1 TABLET BY MOUTH DAILY FOR CHOLESTEROL 90 tablet 1   fexofenadine (ALLEGRA) 180 MG tablet Take 180 mg by mouth daily.     IRON PO Take 50 mg by mouth daily.     labetalol  (NORMODYNE ) 100 MG tablet TAKE 1 TABLET(100 MG) BY MOUTH AT BEDTIME 90 tablet 1   Magnesium 400 MG CAPS Take by mouth daily.     meloxicam  (MOBIC ) 15 MG tablet Take 1/2 to 1 tablet Daily with Food  for Pain & Inflammation 90 tablet 3   Multiple Vitamins-Minerals (MULTIVITAMIN WITH MINERALS) tablet Take 1 tablet by mouth daily.     nitroGLYCERIN  (NITROSTAT ) 0.4 MG SL tablet Place 1 tablet (0.4 mg total) under the tongue every 5 (five) minutes as needed for chest pain. 25 tablet 12   pantoprazole  (PROTONIX ) 40 MG tablet TAKE 1 TABLET BY MOUTH TWICE DAILY(EVERY 12 HOURS) TO PREVENT HEARTBURN AND INDIGESTION 180 tablet 3   vitamin C (ASCORBIC ACID) 500 MG tablet Take 500 mg by mouth daily.     Zinc  50 MG TABS Take by mouth daily.     albuterol  (VENTOLIN  HFA) 108 (90 Base) MCG/ACT inhaler Inhale 2 puffs into the lungs  every 6 (six) hours as needed for wheezing or shortness of breath. (Patient not taking: Reported on 07/07/2023) 8 g 2   Fluticasone  Propionate (FLONASE NA) Place into the nose. (Patient not taking: Reported on 07/07/2023)     No current facility-administered medications for this visit.    Social History   Socioeconomic History   Marital status: Married    Spouse name: Not on file   Number of children: 2   Years of education: Not on file   Highest education level: Bachelor's degree (e.g., BA, AB, BS)  Occupational History   Not on file  Tobacco Use   Smoking status: Former    Current packs/day: 0.00    Average packs/day: 0.3 packs/day for 2.0 years (0.5 ttl pk-yrs)    Types: Cigarettes    Quit date: 09/03/1965    Years since quitting: 57.8   Smokeless tobacco: Never  Substance and Sexual Activity   Alcohol use: Yes    Alcohol/week: 1.0 standard drink of alcohol    Types: 1 Glasses of wine per week    Comment: rare   Drug use: No   Sexual activity: Not Currently    Birth control/protection: None  Other Topics Concern   Not on file  Social History Narrative   Not on file   Social Drivers of Health   Financial Resource Strain: Low Risk  (05/22/2023)   Overall Financial Resource Strain (CARDIA)    Difficulty of Paying Living Expenses: Not hard at all  Food Insecurity: No Food Insecurity (05/22/2023)   Hunger Vital Sign    Worried About Running Out of Food in the Last Year: Never true    Ran Out of Food in the Last Year: Never true  Transportation Needs: No Transportation Needs (05/22/2023)   PRAPARE - Administrator, Civil Service (Medical): No    Lack of Transportation (Non-Medical): No  Physical Activity: Insufficiently Active (05/22/2023)   Exercise Vital Sign    Days of Exercise per Week: 2 days    Minutes of Exercise per Session: 50 min  Stress: No Stress Concern Present (05/22/2023)   [employer] of Occupational Health - Occupational Stress Questionnaire     Feeling of Stress : Not at all  Social Connections: Socially Integrated (05/22/2023)   Social Connection and Isolation Panel [NHANES]    Frequency of Communication with Friends and Family: More than three times a week    Frequency of Social Gatherings with Friends and Family: Once a week    Attends Religious Services: More than 4 times per year    Active Member of [employer] or Organizations: Yes    Attends Engineer, structural: More than 4 times per year    Marital Status: Married  Catering manager Violence: Not on file   Socially she is married has 2 children 3 grandchildren. She does not routinely exercise. There is no tobacco use. She does drink occasional alcohol. She is retired Holiday representative.  Family History  Problem Relation Age of Onset   Anxiety disorder Father    Depression Father    Diabetes Father    Hearing loss Father    Heart disease Father    Heart disease Brother    Hyperlipidemia Brother    Hypertension Brother    Cancer Son    Breast cancer Neg Hx    ROS General: Negative; No fevers, chills, or night sweats;  HEENT: Negative; No changes in vision or hearing, sinus congestion, difficulty swallowing Pulmonary: Negative; No cough, wheezing, shortness of breath, hemoptysis Cardiovascular: Negative; No chest pain, presyncope, syncope, palpatations GI: Negative; No nausea, vomiting, diarrhea, or abdominal pain GU: Negative; No dysuria, hematuria, or difficulty voiding Musculoskeletal: Positive for a Raynaud's; back issues with compression fracture; knee discomfort Hematologic/Oncology: Negative; no easy bruising, bleeding Endocrine: Negative; no heat/cold intolerance; no diabetes Neuro: Negative; no changes in balance, headaches Skin: Negative; No rashes or skin lesions Psychiatric: Negative; No behavioral problems, depression Sleep: Positive for obstructive sleep apnea on CPAP; machine no longer wireless.  She is in need for a new  mask. Other comprehensive 14 point system review is negative.  PE BP 128/68 (BP Location: Right Arm, Patient Position: Sitting, Cuff Size: Normal)   Pulse 94   Ht 5\' 2"  (1.575 m)   Wt 147 lb 6.4 oz (66.9 kg)   SpO2 94%   BMI 26.96 kg/m    Repeat blood pressure by me was 122/70.  Wt Readings from Last 3 Encounters:  07/07/23 147 lb 6.4 oz (66.9 kg)  05/23/23 143 lb 3.2 oz (65 kg)  02/07/23 145 lb (65.8 kg)   General: Alert, oriented, no distress.  Skin: normal turgor, no rashes, warm and  dry HEENT: Normocephalic, atraumatic. Pupils equal round and reactive to light; sclera anicteric; extraocular muscles intact;  Nose without nasal septal hypertrophy Mouth/Parynx benign; Mallinpatti scale 3 Neck: No JVD, no carotid bruits; normal carotid upstroke Lungs: clear to ausculatation and percussion; no wheezing or rales Chest wall: without tenderness to palpitation Heart: PMI not displaced, RRR, s1 s2 normal, 1/6 systolic murmur, no diastolic murmur, no rubs, gallops, thrills, or heaves Abdomen: soft, nontender; no hepatosplenomehaly, BS+; abdominal aorta nontender and not dilated by palpation. Back: no CVA tenderness Pulses 2+ Musculoskeletal: full range of motion, normal strength, no joint deformities Extremities: no clubbing cyanosis or edema, Homan's sign negative  Neurologic: grossly nonfocal; Cranial nerves grossly wnl Psychologic: Normal mood and affect    EKG Interpretation Date/Time:  Thursday Jul 07 2023 14:02:54 EDT Ventricular Rate:  77 PR Interval:  212 QRS Duration:  80 QT Interval:  380 QTC Calculation: 430 R Axis:   6  Text Interpretation: Sinus rhythm with 1st degree A-V block with Premature atrial complexes Nonspecific ST and T wave abnormality Confirmed by Magnus Schuller ([id number]) on 07/07/2023 2:16:55 PM    May 19, 2022 ECG (independently read by me): NSR at 71, siinus arrhythmia , NSSTT changes  June 17, 2021 ECG (independently read by me): Sinus rhythm at  63 bpm with sinus arrhythmia.  No ST segment changes.  Normal intervals  March 23, 2021 ECG (independently read by me):  NSR at 68 with sinus arrythmia  March 04, 2020 ECG (independently read by me):Sinus rhythm at 75 with [facility]  February 14, 2020 ECG (independently read by me): Normal sinus rhythm at 64 bpm.  Isolated PAC, nonspecific ST changes.  Normal intervals.  November 2020 ECG (independently read by me): Sinus rhythm at 61 bpm with mild sinus arrhythmia.  Normal intervals.  No ST segment changes  April 2019  ECG (independently read by me): Sinus bradycardia 58 bpm.  No ectopy.  Normal intervals.  September 2018 ECG (independently read by me): Normal sinus rhythm at 62 bpm.  Nonspecific T-wave abnormality  September 2017 ECG (independently read by me): Sinus bradycardia 56 bpm with mild sinus arrhythmia.  Nonspecific T changes.  September 2016 ECG (independently read by me): Sinus bradycardia with sinus arrhythmia.  No significant ST-T changes.  August 2015 ECG: Sinus rhythm at 51 beats per minute. Non-specific T changes.  LABS:    Latest Ref Rng & Units 01/31/2023    3:02 PM 10/26/2022    3:45 PM 07/19/2022    2:25 PM  BMP  Glucose 65 - 99 mg/dL 83  82  96   BUN 7 - 25 mg/dL 22  18  25    Creatinine 0.60 - 0.95 mg/dL 6.04  5.40  9.81   BUN/Creat Ratio 6 - 22 (calc) SEE NOTE:  SEE NOTE:  SEE NOTE:   Sodium 135 - 146 mmol/L 140  141  141   Potassium 3.5 - 5.3 mmol/L 4.1  4.1  4.7   Chloride 98 - 110 mmol/L 106  104  105   CO2 20 - 32 mmol/L 28  28  25    Calcium 8.6 - 10.4 mg/dL 9.2  9.5  9.4       Latest Ref Rng & Units 01/31/2023    3:02 PM 10/26/2022    3:45 PM 07/19/2022    2:25 PM  Hepatic Function  Total Protein 6.1 - 8.1 g/dL 7.1  6.7  6.7   AST 10 - 35 U/L 24  26  24  ALT 6 - 29 U/L 24  25  21    Total Bilirubin 0.2 - 1.2 mg/dL 0.5  0.4  0.5       Latest Ref Rng & Units 01/31/2023    3:02 PM 10/26/2022    3:45 PM 07/19/2022    2:25 PM  CBC  WBC 3.8 - 10.8  Thousand/uL 6.1  6.5  6.2   Hemoglobin 11.7 - 15.5 g/dL 16.1  09.6  04.5   Hematocrit 35.0 - 45.0 % 40.4  38.9  39.8   Platelets 140 - 400 Thousand/uL 200  209  205    Lab Results  Component Value Date   MCV 93.1 01/31/2023   MCV 94.4 10/26/2022   MCV 92.3 07/19/2022   Lab Results  Component Value Date   TSH 3.49 01/31/2023   Lab Results  Component Value Date   HGBA1C 6.0 (H) 01/31/2023   Lipid Panel     Component Value Date/Time   CHOL 148 01/31/2023 1502   TRIG 89 01/31/2023 1502   HDL 68 01/31/2023 1502   CHOLHDL 2.2 01/31/2023 1502   VLDL 22 10/04/2016 1114   LDLCALC 63 01/31/2023 1502    RADIOLOGY: No results found.  IMPRESSION:  1. CAD in native artery   2. Hx of CABG: March 02, 2000.   3. Hyperlipidemia with target LDL less than 70   4. Mild aortic stenosis   5. OSA (obstructive sleep apnea)   6. Raynaud's disease without gangrene    ASSESSMENT AND PLAN: Jasmine Reeves is an 83 year old Caucasian female who underwent cardiac catheterization by me on New Year's Eve 2001.  Catheterization revealed severe two-vessel coronary artery disease and she underwent CABG revascularization surgery on 03/02/2000. Her last catheterization in 2008 showed patent grafts. She has normal LV function and mitral valve prolapse.   A nuclear perfusion study in 2015 was unchanged and continued to show normal perfusion without scar or ischemia.  She did have exercise-induced ST changes.  She has a history of Raynaud's phenomena which has successfully been treated with her diltiazem .  Nuclear and echo studies were done in late 2020 prior to her planned bladder surgery.  She again was documented to have normal systolic function and had mild aortic stenosis with a mean gradient of 10 and peak gradient of 14 mm.  Estimated aortic valve area was 1.59 cm.  Nuclear study continued to show normal perfusion.   She sustained a 30% partial fracture at T12 when she was helping care for her husband  who is developed progressive Parkinson's debility.  At her evaluation in January 2023 she was experiencing some nonexertional chest discomfort.  I recommended she undergo a follow-up nuclear study since it was 21 years since her CABG revascularization. Her echo and nuclear studies from May 11, 2021 remain stable with normal myocardial perfusion without scar or ischemia.  Her echo Doppler study essentially was unchanged and demonstrated normal LV function with very mild aortic stenosis.  Blood pressure today is excellent on current therapy.  She remains asymptomatic cardiovascularly.  She has been on Zetia /simvastatin  for lipid management and had been followed by Dr. Cassondra Cliff.  Unfortunately with his recent death, she is establishing primary care with Dr. Therese Flash.  She tells me she will undergo laboratory in July with him.  I have suggested that he also check LP(a) at that time with her lipid studies.  She has been on CPAP therapy and received a new ResMed AirSense 11 AutoSet unit on January 11, 2022 with [employer] providing her replacement device.  She is compliant.  Her CPAP is set at a pressure range of 8 to 12 cm.  AHI is 0.7.  95th percentile pressure is 10.6 with maximal average pressure 11.3.  Clinically she is doing remarkably well but has issues with compression fracture and her degenerative scoliosis requiring some occasional ambulation assistance with a cane.  She has been caring for her husband who has developed progressive Parkinson's disease.  She is aware of my upcoming retirement.  I am recommending she undergo a follow-up echo Doppler study in January 2026 I will transition her to the care of Dr. Alvis Ba to be seen in February 2026 or sooner as needed.  Jasmine Ally, MD, [facility]  07/07/2023 4:39 PM

## 2023-07-07 NOTE — Patient Instructions (Addendum)
 Medication Instructions:  Your physician recommends that you continue on your current medications as directed. Please refer to the Current Medication list given to you today.  *If you need a refill on your cardiac medications before your next appointment, please call your pharmacy*  Lab Work: None ordered  If you have labs (blood work) drawn today and your tests are completely normal, you will receive your results only by: MyChart Message (if you have MyChart) OR A paper copy in the mail If you have any lab test that is abnormal or we need to change your treatment, we will call you to review the results.  Testing/Procedures: Your physician has requested that you have an echocardiogram JANUARY 2026. Echocardiography is a painless test that uses sound waves to create images of your heart. It provides your doctor with information about the size and shape of your heart and how well your heart's chambers and valves are working. This procedure takes approximately one hour. There are no restrictions for this procedure. Please do NOT wear cologne, perfume, aftershave, or lotions (deodorant is allowed). Please arrive 15 minutes prior to your appointment time.  Please note: We ask at that you not bring children with you during ultrasound (echo/ vascular) testing. Due to room size and safety concerns, children are not allowed in the ultrasound rooms during exams. Our front office staff cannot provide observation of children in our lobby area while testing is being conducted. An adult accompanying a patient to their appointment will only be allowed in the ultrasound room at the discretion of the ultrasound technician under special circumstances. We apologize for any inconvenience.   Follow-Up: At [facility], you and your health needs are our priority.  As part of our continuing mission to provide you with exceptional heart care, our providers are all part of one team.  This team includes your  primary Cardiologist (physician) and Advanced Practice Providers or APPs (Physician Assistants and Nurse Practitioners) who all work together to provide you with the care you need, when you need it.  Your next appointment:   04/2024   Provider:    Dr. Bertha Broad   We recommend signing up for the patient portal called "MyChart".  Sign up information is provided on this After Visit Summary.  MyChart is used to connect with patients for Virtual Visits (Telemedicine).  Patients are able to view lab/test results, encounter notes, upcoming appointments, etc.  Non-urgent messages can be sent to your provider as well.   To learn more about what you can do with MyChart, go to [email or web address].   Other Instructions     Just

## 2023-08-04 ENCOUNTER — Ambulatory Visit: Payer: Medicare PPO | Admitting: Internal Medicine

## 2023-08-04 DIAGNOSIS — M25552 Pain in left hip: Secondary | ICD-10-CM | POA: Diagnosis not present

## 2023-08-04 DIAGNOSIS — R2681 Unsteadiness on feet: Secondary | ICD-10-CM | POA: Diagnosis not present

## 2023-08-04 DIAGNOSIS — M25561 Pain in right knee: Secondary | ICD-10-CM | POA: Diagnosis not present

## 2023-08-05 ENCOUNTER — Other Ambulatory Visit: Payer: Self-pay | Admitting: Family Medicine

## 2023-08-05 MED ORDER — PANTOPRAZOLE SODIUM 40 MG PO TBEC: DELAYED_RELEASE_TABLET | ORAL | 0 refills | Status: DC

## 2023-08-05 MED ORDER — LABETALOL HCL 100 MG PO TABS: 100.0000 mg | ORAL_TABLET | Freq: Every day | ORAL | 0 refills | Status: DC

## 2023-08-05 NOTE — Telephone Encounter (Signed)
 Patient notified VIA phone. Dm/cma

## 2023-08-05 NOTE — Telephone Encounter (Signed)
 Copied from CRM [id number]. Topic: Clinical - Medication Refill >> Aug 05, 2023 10:03 AM Tanazia G wrote: Medication:  labetalol  (NORMODYNE ) 100 MG tablet pantoprazole  (PROTONIX ) 40 MG tablet  Has the patient contacted their pharmacy? Yes (Agent: If no, request that the patient contact the pharmacy for the refill. If patient does not wish to contact the pharmacy document the reason why and proceed with request.) (Agent: If yes, when and what did the pharmacy advise?)  This is the patient's preferred pharmacy:  [id number] - [place], [place] - [address] AT [address] [facility] [address] & [address] [place] [address] Phone: [phone] Fax: [phone]  Is this the correct pharmacy for this prescription? Yes If no, delete pharmacy and type the correct one.   Has the prescription been filled recently? Yes  Is the patient out of the medication? Yes  Has the patient been seen for an appointment in the last year OR does the patient have an upcoming appointment? Yes  Can we respond through MyChart? Yes  Agent: Please be advised that Rx refills may take up to 3 business days. We ask that you follow-up with your pharmacy.

## 2023-08-05 NOTE — Telephone Encounter (Signed)
 Refill request for  Labetalol  LR HX provider  Protonix   LR Hx provider LOV  05/23/23 FOV 09/27/23  Please review and advise.  Thanks. Dm/cma

## 2023-08-08 ENCOUNTER — Encounter: Payer: Self-pay | Admitting: Nurse Practitioner

## 2023-08-08 ENCOUNTER — Ambulatory Visit: Admitting: Nurse Practitioner

## 2023-08-08 ENCOUNTER — Ambulatory Visit: Payer: Self-pay

## 2023-08-08 VITALS — BP 128/62 | HR 68 | Temp 97.6°F | Ht 62.0 in | Wt 146.4 lb

## 2023-08-08 DIAGNOSIS — B029 Zoster without complications: Secondary | ICD-10-CM | POA: Insufficient documentation

## 2023-08-08 MED ORDER — VALACYCLOVIR HCL 1 G PO TABS: 1000.0000 mg | ORAL_TABLET | Freq: Three times a day (TID) | ORAL | 0 refills | Status: AC

## 2023-08-08 NOTE — Patient Instructions (Signed)
 It was great to see you!  Start valtrex 3 times a day for 10 days  Keep the area covered   Avoid young children and pregnant women   Let's follow-up if symptoms worsen or don't improve   Take care,  Rheba Cedar, NP

## 2023-08-08 NOTE — Assessment & Plan Note (Signed)
 Recurrent herpes zoster presents with rash, itching, burning, and fatigue. No blisters are observed. Prescribe Valtrex 1,000mg  (valacyclovir) 3 times a day for 10 days. Keep the affected area covered with clothing. Avoid contact with young children and pregnant women. Discuss the possibility of the rash worsening before improvement. Recommend receiving the second shingles vaccine after recovery.

## 2023-08-08 NOTE — Telephone Encounter (Signed)
 FYI Only or Action Required?: FYI only for provider  Patient was last seen in primary care on 05/23/2023 by Graig Lawyer, MD. Called Nurse Triage reporting Herpes Zoster. Symptoms began several days ago. Interventions attempted: Nothing. Symptoms are: gradually worsening.  Triage Disposition: See Physician Within 24 Hours  Patient/caregiver understands and will follow disposition?: Yes      Copied from CRM [id number]. Topic: Clinical - Red Word Triage >> Aug 08, 2023 10:52 AM Lajean Pike wrote: Red Word that prompted transfer to Nurse Triage: Shingles possible- skin is burning and itching Reason for Disposition  [1] Shingles rash (matches SYMPTOMS) AND [2] onset < 72 hours ago (3 days)  Answer Assessment - Initial Assessment Questions 1. APPEARANCE of RASH: "Describe the rash."      No blisters but patient notices feels a rash - unable to fully assess due to location 2. LOCATION: "Where is the rash located?"      Right hip on the back 3. ONSET: "When did the rash start?"      "Over the weekend" 4. ITCHING: "Does the rash itch?" If Yes, ask: "How bad is the itch?"  (Scale 1-10; or mild, moderate, severe)     "Not real bad" 5. PAIN: "Does the rash hurt?" If Yes, ask: "How bad is the pain?"  (Scale 0-10; or none, mild, moderate, severe)    - NONE (0): no pain    - MILD (1-3): doesn't interfere with normal activities     - MODERATE (4-7): interferes with normal activities or awakens from sleep     - SEVERE (8-10): excruciating pain, unable to do any normal activities     3 6. OTHER SYMPTOMS: "Do you have any other symptoms?" (e.g., fever)     Right knee pain, patient states "I haven't felt good the last few days and increased achiness"   Patient states she has a previous Shingles outbreak a few years ago in this same location, and this specific instance feels similar.  Protocols used: Shingles (Zoster)-A-AH

## 2023-08-08 NOTE — Progress Notes (Signed)
 Acute Office Visit  Subjective:     Patient ID: Jasmine Reeves, female    DOB: Jan 03, 1941, 83 y.o.   MRN: [id number]  Chief Complaint  Patient presents with   Rash    On right buttocks-had shingles twice in same area   HPI:  Discussed the use of AI scribe software for clinical note transcription with the patient, who gave verbal consent to proceed.  History of Present Illness   Jasmine M Borys "[place]" is an 83 year old female who presents with a suspected recurrence of shingles.  She experiences itching, burning, and a rash in the same area as previous shingles episodes (right buttock), with burning and aching sensations. The rash appeared yesterday, but she is unsure if there are blisters. She denies fever but feels tired and run down.  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 Her back is described as problematic, and she is uncertain if her current symptoms are related to her back issues. 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 Physical therapy has not provided much relief, and her knee has been particularly bothersome in the last day, causing aching and giving way.  She recently completed a course of Augmentin for severe congestion affecting her head and ears, which concluded about a week and a half ago. 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      ROS See pertinent positives and negatives per HPI.     Objective:    BP 128/62 (BP Location: Left Arm, Patient Position: Sitting, Cuff Size: Normal)   Pulse 68   Temp 97.6 F (36.4 C)   Ht 5\' 2"  (1.575 m)   Wt 146 lb 6.4 oz (66.4 kg)   SpO2 98%   BMI 26.78 kg/m    Physical Exam Vitals and nursing note reviewed.  Constitutional:      General: She is not in acute distress.    Appearance: Normal appearance.   HENT:     Head: Normocephalic.  Eyes:     Conjunctiva/sclera: Conjunctivae normal.  Cardiovascular:     Rate and Rhythm: Normal rate and regular rhythm.     Pulses: Normal pulses.     Heart sounds: Murmur heard.  Pulmonary:     Effort: Pulmonary effort is normal.     Breath sounds: Normal breath sounds.  Musculoskeletal:     Cervical back: Normal range of motion.  Skin:    General: Skin is warm.     Findings: Rash (right buttock, early signs of vesicles) present.  Neurological:     General: No focal deficit present.     Mental Status: She is alert and oriented to person, place, and time.  Psychiatric:        Mood and Affect: Mood normal.        Behavior: Behavior normal.        Thought Content: Thought content normal.        Judgment: Judgment normal.       Assessment & Plan:   Problem List Items Addressed This Visit       Nervous and Auditory   Herpes zoster without complication - Primary   Recurrent herpes zoster presents with rash, itching, burning, and fatigue. No blisters are observed. Prescribe Valtrex 1,000mg  (valacyclovir) 3 times a day for 10 days. Keep the affected area covered  with clothing. Avoid contact with young children and pregnant women. Discuss the possibility of the rash worsening before improvement. Recommend receiving the second shingles vaccine after recovery.       Relevant Medications   valACYclovir (VALTREX) 1000 MG tablet    Meds ordered this encounter  Medications   valACYclovir (VALTREX) 1000 MG tablet    Sig: Take 1 tablet (1,000 mg total) by mouth 3 (three) times daily for 10 days.    Dispense:  30 tablet    Refill:  0    Return if symptoms worsen or fail to improve.  Odette Benjamin, NP

## 2023-08-09 NOTE — Telephone Encounter (Signed)
 Patient was seen on 08/08/23. Dm/cma

## 2023-08-16 DIAGNOSIS — M25552 Pain in left hip: Secondary | ICD-10-CM | POA: Diagnosis not present

## 2023-08-16 DIAGNOSIS — M25561 Pain in right knee: Secondary | ICD-10-CM | POA: Diagnosis not present

## 2023-08-16 DIAGNOSIS — R2681 Unsteadiness on feet: Secondary | ICD-10-CM | POA: Diagnosis not present

## 2023-08-18 DIAGNOSIS — R2681 Unsteadiness on feet: Secondary | ICD-10-CM | POA: Diagnosis not present

## 2023-08-18 DIAGNOSIS — M25552 Pain in left hip: Secondary | ICD-10-CM | POA: Diagnosis not present

## 2023-08-18 DIAGNOSIS — M25561 Pain in right knee: Secondary | ICD-10-CM | POA: Diagnosis not present

## 2023-09-07 DIAGNOSIS — M25561 Pain in right knee: Secondary | ICD-10-CM | POA: Diagnosis not present

## 2023-09-24 ENCOUNTER — Other Ambulatory Visit: Payer: Self-pay | Admitting: Family Medicine

## 2023-09-27 ENCOUNTER — Encounter: Payer: Self-pay | Admitting: Family Medicine

## 2023-09-27 ENCOUNTER — Ambulatory Visit: Admitting: Family Medicine

## 2023-09-27 VITALS — BP 124/86 | HR 83 | Temp 97.1°F | Ht 62.0 in | Wt 148.6 lb

## 2023-09-27 DIAGNOSIS — E782 Mixed hyperlipidemia: Secondary | ICD-10-CM

## 2023-09-27 DIAGNOSIS — I25118 Atherosclerotic heart disease of native coronary artery with other forms of angina pectoris: Secondary | ICD-10-CM | POA: Diagnosis not present

## 2023-09-27 DIAGNOSIS — I10 Essential (primary) hypertension: Secondary | ICD-10-CM | POA: Diagnosis not present

## 2023-09-27 DIAGNOSIS — K862 Cyst of pancreas: Secondary | ICD-10-CM

## 2023-09-27 MED ORDER — LABETALOL HCL 100 MG PO TABS: 100.0000 mg | ORAL_TABLET | Freq: Every day | ORAL | 2 refills | Status: AC

## 2023-09-27 MED ORDER — NITROGLYCERIN 0.4 MG SL SUBL: 0.4000 mg | SUBLINGUAL_TABLET | SUBLINGUAL | 12 refills | Status: AC | PRN

## 2023-09-27 MED ORDER — EZETIMIBE-SIMVASTATIN 10-20 MG PO TABS: ORAL_TABLET | ORAL | 1 refills | Status: AC

## 2023-09-27 NOTE — Assessment & Plan Note (Signed)
 Past history of a pancreatic cyst. This was stable over several years. [name] notes her stool has been floating in the toilet more. She was warned that this could be a sign of pancreatic cancer. She has wondered about whether she should return to see the surgical oncologist she had seen in [facility]. We discussed this might be advisable for monitoring.

## 2023-09-27 NOTE — Assessment & Plan Note (Signed)
 Blood pressure is in good control. Continue diltiazem XR 180 mg daily and labetalol 100 mg at bedtime

## 2023-09-27 NOTE — Assessment & Plan Note (Signed)
 Stable. No angina. Continue aspirin 81 mg daily. I will renew her NTG.

## 2023-09-27 NOTE — Assessment & Plan Note (Signed)
 At goal. Continue ezetimibe-simvastatin (Vytorin) 10-20 mg daily.

## 2023-09-27 NOTE — Progress Notes (Signed)
 [facility] [address] [place] [address] Dept: [phone] Dept Fax: [phone]  Chronic Care Office Visit  Subjective:    Patient ID: Jasmine Reeves, female    DOB: Feb 28, 1941, 83 y.o..   MRN: [id number]  Chief Complaint  Patient presents with   Follow-up    4 month f/u      History of Present Illness:  Patient is in today for reassessment of chronic medical issues.  Ms. Comacho has a history of coronary artery disease. She had a quadruple bypass in ~ 2002. She is managed on aspirin 81 mg daily. She denies any recent angina. She notes her nitroglycerin  is expired.   Ms. Escamilla has a history of essential hypertension. She is managed on diltiazem  XR 180 mg daily and labetalol  100 mg at bedtime.    Ms. Wetherell has hyperlipidemia. She is managed on ezetimibe -simvastatin  (Vytorin ) 10-20 mg daily.   Ms. Fackler has multiple painful joints. She occasionally takes a meloxicam  for pain. She has avoided taking this daily.  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   Compression fracture of T12 vertebra (HCC) 02/21/2020   Scoliosis deformity of spine 02/16/2019   Cystocele with rectocele 12/13/2018   BMI 23.0-23.9, adult 11/01/2017   Osteoporosis 04/12/2017   OSA on CPAP 11/27/2014   IBS (irritable bowel syndrome) 11/11/2014   Abnormal blood sugar 10/10/2013   Mixed hyperlipidemia 10/08/2013   CAD (coronary artery disease) 02/03/2013   MVP (mitral valve prolapse) 02/03/2013   Vitamin D  deficiency    GERD (gastroesophageal reflux disease)    COPD (chronic  obstructive pulmonary disease) (HCC) 02/11/2010   Essential hypertension 01/28/2010   Raynauds syndrome 01/28/2010   Past Surgical History:  Procedure Laterality Date   ABDOMINAL HYSTERECTOMY     APPENDECTOMY     BREAST BIOPSY Left 04/23/2022   US  LT BREAST BX W LOC DEV 1ST LESION IMG BX SPEC US  GUIDE 04/23/2022 [facility]   CORONARY ANGIOPLASTY  12/11   CORONARY ARTERY BYPASS GRAFT     CYSTOCELE REPAIR  03/12/2019   cystocele and rectocele repair, Dr. Rosalynn   ESOPHAGOGASTRODUODENOSCOPY  07/21/12   polyps, Hiatal hernia Dr. Donnald   TONSILLECTOMY     TUBAL LIGATION     Family History  Problem Relation Age of Onset   Anxiety disorder Father    Depression Father    Diabetes Father    Hearing loss Father    Heart disease Father    Heart disease Brother    Hyperlipidemia Brother    Hypertension Brother    Cancer Son    Breast cancer Neg Hx    Outpatient Medications Prior to Visit  Medication Sig Dispense Refill   albuterol  (VENTOLIN  HFA) 108 (90 Base) MCG/ACT inhaler Inhale 2 puffs into the lungs every 6 (six) hours as needed for wheezing or shortness of breath. 8 g 2   aspirin 81 MG tablet Take 81 mg by mouth daily.     Cholecalciferol (VITAMIN D ) 2000 UNITS tablet Take 2,000 Units by mouth 2 (two) times daily.     DILT-XR 180 MG 24 hr capsule TAKE 1 CAPSULE BY  MOUTH DAILY 90 capsule 3   fexofenadine (ALLEGRA) 180 MG tablet Take 180 mg by mouth daily.     Fluticasone  Propionate (FLONASE NA) Place into the nose as needed.     IRON PO Take 50 mg by mouth daily.     Magnesium 400 MG CAPS Take by mouth daily.     meloxicam  (MOBIC ) 15 MG tablet Take 1/2 to 1 tablet Daily with Food  for Pain & Inflammation 90 tablet 3   Multiple Vitamins-Minerals (MULTIVITAMIN WITH MINERALS) tablet Take 1 tablet by mouth daily.     pantoprazole  (PROTONIX ) 40 MG tablet TAKE 1 TABLET BY MOUTH TWICE DAILY( EVERY 12 HOURS) TO PREVENT HEARTBURN AND INDIGESTION 120 tablet 0   vitamin C (ASCORBIC  ACID) 500 MG tablet Take 500 mg by mouth daily.     Zinc  50 MG TABS Take by mouth daily.     ezetimibe -simvastatin  (VYTORIN ) 10-20 MG tablet TAKE 1 TABLET BY MOUTH DAILY FOR CHOLESTEROL 90 tablet 1   labetalol  (NORMODYNE ) 100 MG tablet Take 1 tablet (100 mg total) by mouth at bedtime. 60 tablet 0   nitroGLYCERIN  (NITROSTAT ) 0.4 MG SL tablet Place 1 tablet (0.4 mg total) under the tongue every 5 (five) minutes as needed for chest pain. 25 tablet 12   No facility-administered medications prior to visit.   Allergies  Allergen Reactions   Altace [Ramipril]     cough   Azithromycin     palpitation   Celebrex [Celecoxib]     Gi upset    Hydrochlorothiazide     cramping   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   Prilosec [Omeprazole]     constipation   Tape Other (See Comments)   Tapentadol     Other reaction(s): Other (See Comments), redness & itch   Objective:   Today's Vitals   09/27/23 1346  BP: 124/86  Pulse: 83  Temp: (!) 97.1 F (36.2 C)  TempSrc: Temporal  SpO2: 98%  Weight: 148 lb 9.6 oz (67.4 kg)  Height: 5' 2 (1.575 m)   Body mass index is 27.18 kg/m.   General: Well developed, well nourished. No acute distress. Psych: Alert and oriented. Normal mood and affect.  Health Maintenance Due  Topic Date Due   Zoster Vaccines- Shingrix (1 of 2) 10/09/1959     Assessment & Plan:   Problem List Items Addressed This Visit       Cardiovascular and Mediastinum   CAD (coronary artery disease) - Primary   Stable. No angina. Continue aspirin 81 mg daily. I will renew her NTG.      Relevant Medications   labetalol  (NORMODYNE ) 100 MG tablet   ezetimibe -simvastatin  (VYTORIN ) 10-20 MG tablet   nitroGLYCERIN  (NITROSTAT ) 0.4 MG SL tablet   Essential hypertension   Blood pressure is in good control. Continue diltiazem  XR 180 mg daily and labetalol  100 mg at bedtime      Relevant Medications   labetalol  (NORMODYNE ) 100 MG tablet    ezetimibe -simvastatin  (VYTORIN ) 10-20 MG tablet   nitroGLYCERIN  (NITROSTAT ) 0.4 MG SL tablet     Digestive   Pancreas cyst   Past history of a pancreatic cyst. This was stable over several years. Ms. Daubert notes her stool has been floating in the toilet more. She was warned that this could be a sign of pancreatic cancer. She has wondered about whether she should return to see the surgical oncologist she had seen in [facility]. We discussed this might  be advisable for monitoring.        Other   Mixed hyperlipidemia   At goal. Continue ezetimibe -simvastatin  (Vytorin ) 10-20 mg daily.      Relevant Medications   labetalol  (NORMODYNE ) 100 MG tablet   ezetimibe -simvastatin  (VYTORIN ) 10-20 MG tablet   nitroGLYCERIN  (NITROSTAT ) 0.4 MG SL tablet    Return in about 3 months (around 12/28/2023) for Reassessment.   Garnette CHRISTELLA Simpler, MD

## 2023-10-03 DIAGNOSIS — M25561 Pain in right knee: Secondary | ICD-10-CM | POA: Diagnosis not present

## 2023-10-24 DIAGNOSIS — G4733 Obstructive sleep apnea (adult) (pediatric): Secondary | ICD-10-CM | POA: Diagnosis not present

## 2023-10-25 ENCOUNTER — Ambulatory Visit (HOSPITAL_BASED_OUTPATIENT_CLINIC_OR_DEPARTMENT_OTHER)
Admission: RE | Admit: 2023-10-25 | Discharge: 2023-10-25 | Disposition: A | Discharge status: Home / Self Care | Source: Ambulatory Visit | Attending: Family Medicine | Admitting: Family Medicine

## 2023-10-25 DIAGNOSIS — Z780 Asymptomatic menopausal state: Secondary | ICD-10-CM | POA: Diagnosis not present

## 2023-10-25 DIAGNOSIS — M810 Age-related osteoporosis without current pathological fracture: Secondary | ICD-10-CM | POA: Diagnosis not present

## 2023-10-26 ENCOUNTER — Ambulatory Visit: Payer: Self-pay | Admitting: Family Medicine

## 2023-10-27 DIAGNOSIS — H10413 Chronic giant papillary conjunctivitis, bilateral: Secondary | ICD-10-CM | POA: Diagnosis not present

## 2023-10-27 DIAGNOSIS — H2513 Age-related nuclear cataract, bilateral: Secondary | ICD-10-CM | POA: Diagnosis not present

## 2023-10-27 DIAGNOSIS — H04123 Dry eye syndrome of bilateral lacrimal glands: Secondary | ICD-10-CM | POA: Diagnosis not present

## 2023-10-27 DIAGNOSIS — H47091 Other disorders of optic nerve, not elsewhere classified, right eye: Secondary | ICD-10-CM | POA: Diagnosis not present

## 2023-11-14 DIAGNOSIS — M1711 Unilateral primary osteoarthritis, right knee: Secondary | ICD-10-CM | POA: Diagnosis not present

## 2023-11-15 ENCOUNTER — Telehealth: Payer: Self-pay | Admitting: Cardiovascular Disease

## 2023-11-15 NOTE — Telephone Encounter (Signed)
 Pt calling to ask if she might have left her Medicare red white and blue card at front office [place] [facility] when she came for her appointment in May. She cannot find it and cannot remember if front desk asked for it when she checked in for her appointment. Please advise.

## 2023-11-15 NOTE — Telephone Encounter (Signed)
 Called and spoke with patient. Patient states she went to get a flu shot and realized she did not have her medicare red, white and blue card. Advised patient that the lost and found was checked and the card was not found there. Checked at check in desk in [place] and card was not there either. Patient asked if it was possible to check if card was scanned at her last office visit on 07/07/23. Advised patient that it looks like insurance card scanned under insurance card in media in chart from 07/07/23 was [employer] card. Patient verbalized appreciation for checking. Provided patient with number to contact to request a new card (1-800-MEDICARE).

## 2023-11-23 DIAGNOSIS — M1711 Unilateral primary osteoarthritis, right knee: Secondary | ICD-10-CM | POA: Diagnosis not present

## 2023-11-24 ENCOUNTER — Other Ambulatory Visit: Payer: Self-pay | Admitting: Family Medicine

## 2023-11-28 ENCOUNTER — Ambulatory Visit: Admitting: Emergency Medicine

## 2023-11-28 ENCOUNTER — Encounter: Payer: Self-pay | Admitting: Emergency Medicine

## 2023-11-28 VITALS — BP 128/84 | HR 80 | Temp 99.1°F | Resp 16 | Ht 62.0 in | Wt 148.0 lb

## 2023-11-28 DIAGNOSIS — J329 Chronic sinusitis, unspecified: Secondary | ICD-10-CM

## 2023-11-28 DIAGNOSIS — H6993 Unspecified Eustachian tube disorder, bilateral: Secondary | ICD-10-CM

## 2023-11-28 DIAGNOSIS — J40 Bronchitis, not specified as acute or chronic: Secondary | ICD-10-CM | POA: Diagnosis not present

## 2023-11-28 MED ORDER — ALBUTEROL SULFATE HFA 108 (90 BASE) MCG/ACT IN AERS: 2.0000 | INHALATION_SPRAY | Freq: Four times a day (QID) | RESPIRATORY_TRACT | 0 refills | Status: AC | PRN

## 2023-11-28 MED ORDER — AMOXICILLIN 875 MG PO TABS: 875.0000 mg | ORAL_TABLET | Freq: Two times a day (BID) | ORAL | 0 refills | Status: DC

## 2023-11-28 MED ORDER — PREDNISONE 20 MG PO TABS: 20.0000 mg | ORAL_TABLET | Freq: Two times a day (BID) | ORAL | 0 refills | Status: AC

## 2023-11-28 NOTE — Progress Notes (Signed)
 Assessment & Plan:   Assessment & Plan Sinobronchitis Faint bilateral upper field wheeze, no fevers or hypoxia. Rx as below. Recheck precautions reviewed Orders:   albuterol  (VENTOLIN  HFA) 108 (90 Base) MCG/ACT inhaler; Inhale 2 puffs into the lungs every 6 (six) hours as needed for wheezing or shortness of breath.   amoxicillin  (AMOXIL ) 875 MG tablet; Take 1 tablet (875 mg total) by mouth 2 (two) times daily. 1 po BID  Dysfunction of both eustachian tubes  Orders:   predniSONE  (DELTASONE ) 20 MG tablet; Take 1 tablet (20 mg total) by mouth 2 (two) times daily with a meal for 5 days.       No results found for any visits on 11/28/23.  Follow up plan: No follow-ups on file.  Corean Geralds, MSPAS, PA-C   Subjective:  HPI: Jasmine Reeves is a 83 y.o. female with history of  has a past medical history of Arthritis, Blood transfusion without reported diagnosis, Cataract, COPD (chronic obstructive pulmonary disease) (HCC), Fibromyalgia, GERD (gastroesophageal reflux disease), Heart murmur, IBS (irritable bowel syndrome), Osteoporosis, Scoliosis, and Sleep apnea. presenting on 11/28/2023 for Nasal Congestion (Cough, congestion, headache and right ear fullness x 1 week. Started last week but worse over the weekend. ) No measured fevers Feeling head and chest congestion, yellowish mucous Has been monitoring pulseOx at home and has been good CPAP seems to make her symptoms worse Tussin-DM last night helped just a little Inhaler is old, has not used it.    ROS: Negative unless specifically indicated above in HPI.   Relevant past medical history reviewed and updated as indicated.   Allergies and medications reviewed and updated.   Current Outpatient Medications:    amoxicillin  (AMOXIL ) 875 MG tablet, Take 1 tablet (875 mg total) by mouth 2 (two) times daily. 1 po BID, Disp: 20 tablet, Rfl: 0   aspirin 81 MG tablet, Take 81 mg by mouth daily., Disp: , Rfl:    Cholecalciferol  (VITAMIN D ) 2000 UNITS tablet, Take 2,000 Units by mouth 2 (two) times daily., Disp: , Rfl:    DILT-XR 180 MG 24 hr capsule, TAKE 1 CAPSULE BY MOUTH DAILY, Disp: 90 capsule, Rfl: 3   ezetimibe -simvastatin  (VYTORIN ) 10-20 MG tablet, TAKE 1 TABLET BY MOUTH DAILY FOR CHOLESTEROL, Disp: 90 tablet, Rfl: 1   fexofenadine (ALLEGRA) 180 MG tablet, Take 180 mg by mouth daily., Disp: , Rfl:    Fluticasone  Propionate (FLONASE NA), Place into the nose as needed., Disp: , Rfl:    IRON PO, Take 50 mg by mouth daily., Disp: , Rfl:    labetalol  (NORMODYNE ) 100 MG tablet, Take 1 tablet (100 mg total) by mouth at bedtime., Disp: 180 tablet, Rfl: 2   Magnesium 400 MG CAPS, Take by mouth daily., Disp: , Rfl:    meloxicam  (MOBIC ) 15 MG tablet, Take 1/2 to 1 tablet Daily with Food  for Pain & Inflammation, Disp: 90 tablet, Rfl: 3   Multiple Vitamins-Minerals (MULTIVITAMIN WITH MINERALS) tablet, Take 1 tablet by mouth daily., Disp: , Rfl:    nitroGLYCERIN  (NITROSTAT ) 0.4 MG SL tablet, Place 1 tablet (0.4 mg total) under the tongue every 5 (five) minutes as needed for chest pain., Disp: 25 tablet, Rfl: 12   pantoprazole  (PROTONIX ) 40 MG tablet, TAKE 1 TABLET BY MOUTH TWICE DAILY( EVERY 12 HOURS) TO PREVENT HEARTBURN AND INDIGESTION, Disp: 180 tablet, Rfl: 3   predniSONE  (DELTASONE ) 20 MG tablet, Take 1 tablet (20 mg total) by mouth 2 (two) times daily with a meal  for 5 days., Disp: 10 tablet, Rfl: 0   vitamin C (ASCORBIC ACID) 500 MG tablet, Take 500 mg by mouth daily., Disp: , Rfl:    Zinc  50 MG TABS, Take by mouth daily., Disp: , Rfl:    albuterol  (VENTOLIN  HFA) 108 (90 Base) MCG/ACT inhaler, Inhale 2 puffs into the lungs every 6 (six) hours as needed for wheezing or shortness of breath., Disp: 17 each, Rfl: 0  Allergies  Allergen Reactions   Altace [Ramipril] Cough   Celebrex [Celecoxib]     Gi upset    Tape Rash   Azithromycin Palpitations   Hydrochlorothiazide     cramping   Ppd [Tuberculin Purified Protein  Derivative]     Positive PPD 1998   Prilosec [Omeprazole]     constipation   Tapentadol Rash      Objective:   Vitals:   11/28/23 1258  BP: 128/84  Pulse: 80  Temp: 99.1 F (37.3 C)  Resp: 16  Height: 5' 2 (1.575 m)  Weight: 148 lb (67.1 kg)  SpO2: 95%  BMI (Calculated): 27.06      Gen: appears well, alert, INAD Ears: Right canal normal. Right TM dull, retracted, serous middle ear fluid. Left canal normal. Left TM dull, retracted, serous middle ear fluid.  Nose: mucosal congestion Mouth: Oral mucosa moist. Throat: cobblestoned  Neck: supple and no adenopathy Heart RRR Lungs: Respiratory effort: normal.  mild expiratory wheezing heard both upper lobes Skin: Warm and dry without acute rash to exposed areas.

## 2023-12-05 ENCOUNTER — Ambulatory Visit

## 2023-12-05 DIAGNOSIS — Z0000 Encounter for general adult medical examination without abnormal findings: Secondary | ICD-10-CM | POA: Diagnosis not present

## 2023-12-05 NOTE — Patient Instructions (Signed)
 [name],  Thank you for taking the time for your Medicare Wellness Visit. I appreciate your continued commitment to your health goals. Please review the care plan we discussed, and feel free to reach out if I can assist you further.  Medicare recommends these wellness visits once per year to help you and your care team stay ahead of potential health issues. These visits are designed to focus on prevention, allowing your provider to concentrate on managing your acute and chronic conditions during your regular appointments.  Please note that Annual Wellness Visits do not include a physical exam. Some assessments may be limited, especially if the visit was conducted virtually. If needed, we may recommend a separate in-person follow-up with your provider.  Ongoing Care Seeing your primary care provider every 3 to 6 months helps us  monitor your health and provide consistent, personalized care.   Referrals If a referral was made during today's visit and you haven't received any updates within two weeks, please contact the referred provider directly to check on the status.  Recommended Screenings:  Health Maintenance  Topic Date Due   Zoster (Shingles) Vaccine (1 of 2) 10/09/1961   COVID-19 Vaccine (3 - Pfizer risk series) 05/24/2019   Flu Shot  09/30/2023   Medicare Annual Wellness Visit  12/04/2024   Colon Cancer Screening  05/25/2027   DTaP/Tdap/Td vaccine (4 - Td or Tdap) 02/11/2028   DEXA scan (bone density measurement)  Completed   Meningitis B Vaccine  Aged Out   Pneumococcal Vaccine for age over 73  Discontinued       12/05/2023    4:20 PM  Advanced Directives  Does Patient Have a Medical Advance Directive? Yes  Type of Advance Directive Healthcare Power of Attorney  Copy of Healthcare Power of Attorney in Chart? No - copy requested   Advance Care Planning is important because it: Ensures you receive medical care that aligns with your values, goals, and preferences. Provides  guidance to your family and loved ones, reducing the emotional burden of decision-making during critical moments.  Vision: Annual vision screenings are recommended for early detection of glaucoma, cataracts, and diabetic retinopathy. These exams can also reveal signs of chronic conditions such as diabetes and high blood pressure.  Dental: Annual dental screenings help detect early signs of oral cancer, gum disease, and other conditions linked to overall health, including heart disease and diabetes.  Please see the attached documents for additional preventive care recommendations.

## 2023-12-05 NOTE — Progress Notes (Signed)
 Subjective:   Jasmine Reeves is a 83 y.o. who presents for a Medicare Wellness preventive visit.  As a reminder, Annual Wellness Visits don't include a physical exam, and some assessments may be limited, especially if this visit is performed virtually. We may recommend an in-person follow-up visit with your provider if needed.  Visit Complete: Virtual I connected with  Jasmine Reeves on 12/05/23 by a audio enabled telemedicine application and verified that I am speaking with the correct person using two identifiers.  Patient Location: Home  Provider Location: Office/Clinic  I discussed the limitations of evaluation and management by telemedicine. The patient expressed understanding and agreed to proceed.  Vital Signs: Because this visit was a virtual/telehealth visit, some criteria may be missing or patient reported. Any vitals not documented were not able to be obtained and vitals that have been documented are patient reported.  VideoError- Librarian, academic were attempted between this provider and patient, however failed, due to patient having technical difficulties OR patient did not have access to video capability.  We continued and completed visit with audio only.   Persons Participating in Visit: Patient.  AWV Questionnaire: No: Patient Medicare AWV questionnaire was not completed prior to this visit.  Cardiac Risk Factors include: advanced age (>4men, >24 women);dyslipidemia;hypertension     Objective:    Today's Vitals   There is no height or weight on file to calculate BMI.     12/05/2023    4:20 PM 04/15/2022    3:53 PM 08/25/2021    2:34 PM 10/02/2020    2:33 PM 08/14/2020    2:13 PM 02/16/2020    7:12 AM 12/26/2019    8:47 AM  Advanced Directives  Does Patient Have a Medical Advance Directive? Yes No Yes Yes Yes Yes Yes  Type of Corporate investment banker Power of [employer] Power of [place];Living will Healthcare Power of [employer] of [place];Living will  Does patient want to make changes to medical advance directive?   No - Patient declined  No - Patient declined  No - Patient declined  Copy of Healthcare Power of Attorney in Chart? No - copy requested  No - copy requested  No - copy requested  No - copy requested    Current Medications (verified) Outpatient Encounter Medications as of 12/05/2023  Medication Sig   albuterol  (VENTOLIN  HFA) 108 (90 Base) MCG/ACT inhaler Inhale 2 puffs into the lungs every 6 (six) hours as needed for wheezing or shortness of breath.   amoxicillin  (AMOXIL ) 875 MG tablet Take 1 tablet (875 mg total) by mouth 2 (two) times daily. 1 po BID   aspirin 81 MG tablet Take 81 mg by mouth daily.   Cholecalciferol (VITAMIN D ) 2000 UNITS tablet Take 2,000 Units by mouth 2 (two) times daily.   DILT-XR 180 MG 24 hr capsule TAKE 1 CAPSULE BY MOUTH DAILY   ezetimibe -simvastatin  (VYTORIN ) 10-20 MG tablet TAKE 1 TABLET BY MOUTH DAILY FOR CHOLESTEROL   fexofenadine (ALLEGRA) 180 MG tablet Take 180 mg by mouth daily.   Fluticasone  Propionate (FLONASE NA) Place into the nose as needed.   IRON PO Take 50 mg by mouth daily.   labetalol  (NORMODYNE ) 100 MG tablet Take 1 tablet (100 mg total) by mouth at bedtime.   Magnesium 400 MG CAPS Take by mouth daily.   meloxicam  (MOBIC ) 15 MG tablet Take 1/2 to 1 tablet Daily with Food  for Pain & Inflammation   Multiple Vitamins-Minerals (MULTIVITAMIN WITH MINERALS) tablet Take 1 tablet by mouth daily.   nitroGLYCERIN  (NITROSTAT ) 0.4 MG SL tablet Place 1 tablet (0.4 mg total) under the tongue every 5 (five) minutes as needed for chest pain.   pantoprazole  (PROTONIX ) 40 MG tablet TAKE 1 TABLET BY MOUTH TWICE DAILY( EVERY 12 HOURS) TO PREVENT HEARTBURN AND INDIGESTION   vitamin C (ASCORBIC ACID) 500 MG tablet Take 500 mg by mouth daily.   Zinc  50 MG TABS Take by mouth daily.   No  facility-administered encounter medications on file as of 12/05/2023.    Allergies (verified) Altace [ramipril], Celebrex [celecoxib], Tape, Azithromycin, Hydrochlorothiazide, Ppd [tuberculin purified protein derivative], Prilosec [omeprazole], and Tapentadol   History: Past Medical History:  Diagnosis Date   Arthritis    Blood transfusion without reported diagnosis    Cataract    COPD (chronic obstructive pulmonary disease) (HCC)    Fibromyalgia    GERD (gastroesophageal reflux disease)    Heart murmur    IBS (irritable bowel syndrome)    Osteoporosis    Scoliosis    Sleep apnea    Past Surgical History:  Procedure Laterality Date   ABDOMINAL HYSTERECTOMY     APPENDECTOMY     BREAST BIOPSY Left 04/23/2022   US  LT BREAST BX W LOC DEV 1ST LESION IMG BX SPEC US  GUIDE 04/23/2022 [facility]MO[facility]   CORONARY ANGIOPLASTY  12/11   CORONARY ARTERY BYPASS GRAFT     CYSTOCELE REPAIR  03/12/2019   cystocele and rectocele repair, Dr. Rosalynn   ESOPHAGOGASTRODUODENOSCOPY  07/21/12   polyps, Hiatal hernia Dr. Donnald   TONSILLECTOMY     TUBAL LIGATION     Family History  Problem Relation Age of Onset   Anxiety disorder Father    Depression Father    Diabetes Father    Hearing loss Father    Heart disease Father    Heart disease Brother    Hyperlipidemia Brother    Hypertension Brother    Cancer Son    Breast cancer Neg Hx    Social History   Socioeconomic History   Marital status: Married    Spouse name: Not on file   Number of children: 2   Years of education: Not on file   Highest education level: Bachelor's degree (e.g., BA, AB, BS)  Occupational History   Not on file  Tobacco Use   Smoking status: Former    Current packs/day: 0.00    Average packs/day: 0.3 packs/day for 2.0 years (0.5 ttl pk-yrs)    Types: Cigarettes    Quit date: 09/03/1965    Years since quitting: 58.2   Smokeless tobacco: Never  Vaping Use   Vaping status: Never Used  Substance and Sexual  Activity   Alcohol use: Not Currently    Alcohol/week: 2.0 standard drinks of alcohol    Types: 2 Glasses of wine per week   Drug use: No   Sexual activity: Not Currently    Birth control/protection: None  Other Topics Concern   Not on file  Social History Narrative   Not on file   Social Drivers of Health   Financial Resource Strain: Low Risk  (12/05/2023)   Overall Financial Resource Strain (CARDIA)    Difficulty of Paying Living Expenses: Not hard at all  Food Insecurity: No Food Insecurity (12/05/2023)   Hunger Vital Sign    Worried About Running Out of Food in the Last Year: Never true    Ran  Out of Food in the Last Year: Never true  Transportation Needs: No Transportation Needs (12/05/2023)   PRAPARE - Administrator, Civil Service (Medical): No    Lack of Transportation (Non-Medical): No  Physical Activity: Insufficiently Active (12/05/2023)   Exercise Vital Sign    Days of Exercise per Week: 3 days    Minutes of Exercise per Session: 40 min  Stress: No Stress Concern Present (12/05/2023)   [employer] of Occupational Health - Occupational Stress Questionnaire    Feeling of Stress: Not at all  Social Connections: Moderately Integrated (12/05/2023)   Social Connection and Isolation Panel    Frequency of Communication with Friends and Family: More than three times a week    Frequency of Social Gatherings with Friends and Family: More than three times a week    Attends Religious Services: More than 4 times per year    Active Member of [employer] or Organizations: No    Attends Banker Meetings: Never    Marital Status: Married    Tobacco Counseling Counseling given: Not Answered    Clinical Intake:  Pre-visit preparation completed: Yes  Pain : No/denies pain     Nutritional Risks: None Diabetes: No  Lab Results  Component Value Date   HGBA1C 6.0 (H) 01/31/2023   HGBA1C 6.1 (H) 07/19/2022   HGBA1C 6.2 (H) 04/15/2022     How often  do you need to have someone help you when you read instructions, pamphlets, or other written materials from your doctor or pharmacy?: 1 - Never  Interpreter Needed?: No  Information entered by :: NAllen LPN   Activities of Daily Living     12/05/2023    4:13 PM  In your present state of health, do you have any difficulty performing the following activities:  Hearing? 1  Comment has hearing aids  Vision? 0  Difficulty concentrating or making decisions? 0  Walking or climbing stairs? 0  Dressing or bathing? 0  Doing errands, shopping? 0  Preparing Food and eating ? N  Using the Toilet? N  In the past six months, have you accidently leaked urine? Y  Do you have problems with loss of bowel control? N  Managing your Medications? N  Managing your Finances? N  Housekeeping or managing your Housekeeping? N    Patient Care Team: Thedora Garnette HERO, MD as PCP - General (Family Medicine) Burnard Debby LABOR, MD (Inactive) as Consulting Physician (Cardiology) Donnald Charleston, MD as Consulting Physician (Gastroenterology) Alvia Dorothyann LABOR, MD as Referring Physician (Gynecology)  I have updated your Care Teams any recent Medical Services you may have received from other providers in the past year.     Assessment:   This is a routine wellness examination for Jasmine Reeves.  Hearing/Vision screen Hearing Screening - Comments:: Has hearing aids that are maintained Vision Screening - Comments:: Regular eye exams, [facility]   Goals Addressed             This Visit's Progress    Patient Stated       12/05/2023, denies goals       Depression Screen     12/05/2023    4:22 PM 11/28/2023    1:04 PM 05/23/2023    1:36 PM 04/15/2022    3:54 PM 12/05/2021   10:04 PM 08/25/2021    2:34 PM 11/16/2020    9:56 PM  PHQ 2/9 Scores  PHQ - 2 Score 0 0 0 0 0 1 0  PHQ- 9 Score 1 0 1        Fall Risk     12/05/2023    4:21 PM 11/28/2023    1:03 PM 05/23/2023    1:36 PM 04/15/2022    3:54 PM  12/05/2021   10:03 PM  Fall Risk   Falls in the past year? 0 0 1 0 0  Number falls in past yr: 0 0 0    Injury with Fall? 0 0 1    Risk for fall due to : Impaired balance/gait;Medication side effect Impaired balance/gait History of fall(s)  No Fall Risks  Follow up Falls evaluation completed;Falls prevention discussed Falls evaluation completed Falls evaluation completed  Falls prevention discussed;Education provided;Falls evaluation completed      Data saved with a previous flowsheet row definition    MEDICARE RISK AT HOME:  Medicare Risk at Home Any stairs in or around the home?: Yes (elevators) If so, are there any without handrails?: No Home free of loose throw rugs in walkways, pet beds, electrical cords, etc?: Yes Adequate lighting in your home to reduce risk of falls?: Yes Life alert?: Yes Use of a cane, walker or w/c?: Yes Grab bars in the bathroom?: Yes Shower chair or bench in shower?: Yes Elevated toilet seat or a handicapped toilet?: Yes  TIMED UP AND GO:  Was the test performed?  No  Cognitive Function: 6CIT completed        12/05/2023    4:23 PM  6CIT Screen  What Year? 0 points  What month? 0 points  What time? 3 points  Count back from 20 0 points  Months in reverse 0 points  Repeat phrase 0 points  Total Score 3 points    Immunizations Immunization History  Administered Date(s) Administered   Fluad Quad(high Dose 65+) 11/16/2021   Fluad Trivalent(High Dose 65+) 11/23/2022   Hepatitis A, Ped/Adol-2 Dose 05/24/2006, 12/22/2006   INFLUENZA, HIGH DOSE SEASONAL PF 11/11/2014, 12/08/2015   Influenza Inj Mdck Quad Pf 11/29/2021   Influenza,inj,Quad PF,6+ Mos 11/27/2016, 11/20/2017, 11/14/2018, 11/11/2020   Influenza-Unspecified 11/20/2019   PFIZER(Purple Top)SARS-COV-2 Vaccination 04/03/2019, 04/26/2019   Pneumococcal Conjugate-13 04/16/2014   Pneumococcal-Unspecified 03/01/1998, 03/12/2010   Td 08/12/1992, 03/27/2012   Tdap 02/10/2018   Zoster, Live  03/02/2007    Screening Tests Health Maintenance  Topic Date Due   Zoster Vaccines- Shingrix (1 of 2) 10/09/1961   COVID-19 Vaccine (3 - Pfizer risk series) 05/24/2019   Influenza Vaccine  09/30/2023   Medicare Annual Wellness (AWV)  12/04/2024   Colonoscopy  05/25/2027   DTaP/Tdap/Td (4 - Td or Tdap) 02/11/2028   DEXA SCAN  Completed   Meningococcal B Vaccine  Aged Out   Pneumococcal Vaccine: 50+ Years  Discontinued    Health Maintenance Items Addressed: Vaccines Due: flu, shingles, Declines covid vaccine  Additional Screening:  Vision Screening: Recommended annual ophthalmology exams for early detection of glaucoma and other disorders of the eye. Is the patient up to date with their annual eye exam?  Yes  Who is the provider or what is the name of the office in which the patient attends annual eye exams? [facility]  Dental Screening: Recommended annual dental exams for proper oral hygiene  Community Resource Referral / Chronic Care Management: CRR required this visit?  No   CCM required this visit?  No   Plan:    I have personally reviewed and noted the following in the patient's chart:   Medical and social history Use of  alcohol, tobacco or illicit drugs  Current medications and supplements including opioid prescriptions. Patient is not currently taking opioid prescriptions. Functional ability and status Nutritional status Physical activity Advanced directives List of other physicians Hospitalizations, surgeries, and ER visits in previous 12 months Vitals Screenings to include cognitive, depression, and falls Referrals and appointments  In addition, I have reviewed and discussed with patient certain preventive protocols, quality metrics, and best practice recommendations. A written personalized care plan for preventive services as well as general preventive health recommendations were provided to patient.   Ardella FORBES Dawn, LPN   [id number]/04/[id number]   After Visit  Summary: (MyChart) Due to this being a telephonic visit, the after visit summary with patients personalized plan was offered to patient via MyChart   Notes: Nothing significant to report at this time.

## 2023-12-26 ENCOUNTER — Ambulatory Visit: Admitting: Family Medicine

## 2023-12-29 ENCOUNTER — Encounter: Payer: Self-pay | Admitting: Family Medicine

## 2023-12-29 ENCOUNTER — Ambulatory Visit: Admitting: Family Medicine

## 2023-12-29 VITALS — BP 122/70 | HR 74 | Temp 97.6°F | Ht 62.0 in | Wt 140.6 lb

## 2023-12-29 DIAGNOSIS — J40 Bronchitis, not specified as acute or chronic: Secondary | ICD-10-CM | POA: Diagnosis not present

## 2023-12-29 DIAGNOSIS — E782 Mixed hyperlipidemia: Secondary | ICD-10-CM

## 2023-12-29 DIAGNOSIS — I10 Essential (primary) hypertension: Secondary | ICD-10-CM | POA: Diagnosis not present

## 2023-12-29 DIAGNOSIS — M810 Age-related osteoporosis without current pathological fracture: Secondary | ICD-10-CM

## 2023-12-29 DIAGNOSIS — I25118 Atherosclerotic heart disease of native coronary artery with other forms of angina pectoris: Secondary | ICD-10-CM

## 2023-12-29 DIAGNOSIS — M3500 Sicca syndrome, unspecified: Secondary | ICD-10-CM | POA: Insufficient documentation

## 2023-12-29 DIAGNOSIS — J329 Chronic sinusitis, unspecified: Secondary | ICD-10-CM

## 2023-12-29 MED ORDER — IBANDRONATE SODIUM 150 MG PO TABS
150.0000 mg | ORAL_TABLET | ORAL | 0 refills | Status: AC
Start: 2023-12-29 — End: ?

## 2023-12-29 NOTE — Progress Notes (Signed)
 [facility]IMARY [facility]ARE [facility] [address] [place] [address] Dept: [phone] Dept Fax: [phone]  Chronic Care Office Visit  Subjective:    Patient ID: Jasmine Reeves, female    DOB: 1940/12/11, 83 y.o..   MRN: [id number]  Chief Complaint  Patient presents with   Follow-up    3 month f/u. C/o having RT side face discomfort/pain.     History of Present Illness:  Patient is in today for reassessment of chronic medical issues.  [name] has a history of coronary artery disease. She had a quadruple bypass in ~ 2002. She is managed on aspirin 81 mg daily. She denies any recent angina. She notes her nitroglycerin  is expired.   [name] has a history of essential hypertension. She is managed on diltiazem  XR 180 mg daily and labetalol  100 mg at bedtime.    [name] has hyperlipidemia. She is managed on ezetimibe -simvastatin  (Vytorin ) 10-20 mg daily.   [name] had a recent bone density scan. She has had prior osteoporotic fractures of her lumbar spine. She was treated some years ago (at least twice) with alendronate , but had trouble with taking this consistently. Dr. Tonita had prescribed ibandronate  (Boniva ) for her, but she never started this.  [name] was seen about a month ago with a bronchitis. She was treated with a course of antibiotics and steroids. She notes she still has some mild linger cough at this point. She is not having significant nasal congestion or post nasal drip.  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    Compression fracture of T12 vertebra (HCC) 02/21/2020   Scoliosis deformity of spine 02/16/2019   Cystocele with rectocele 12/13/2018   BMI 23.0-23.9, adult 11/01/2017   Osteoporosis 04/12/2017   OSA on CPAP 11/27/2014   IBS (irritable bowel syndrome) 11/11/2014   Abnormal blood sugar 10/10/2013   Mixed hyperlipidemia 10/08/2013   CAD (coronary artery disease) 02/03/2013   MVP (mitral valve prolapse) 02/03/2013   Vitamin D  deficiency    GERD (gastroesophageal reflux disease)    COPD (chronic obstructive pulmonary disease) (HCC) 02/11/2010   Essential hypertension 01/28/2010   Raynauds syndrome 01/28/2010   Past Surgical History:  Procedure Laterality Date   ABDOMINAL HYSTERECTOMY     APPENDECTOMY     BREAST BIOPSY Left 04/23/2022   US  LT BREAST BX W LOC DEV 1ST LESION IMG BX SPEC US  GUIDE 04/23/2022 [facility]AMMOGRAP[facility]   CORONARY ANGIOPLASTY  12/11   CORONARY ARTERY BYPASS GRAFT     CYSTOCELE REPAIR  03/12/2019   cystocele and rectocele repair, Dr. Rosalynn   ESOPHAGOGASTRODUODENOSCOPY  07/21/12   polyps, Hiatal hernia Dr. Donnald   TONSILLECTOMY     TUBAL LIGATION     Family History  Problem Relation Age of Onset   Anxiety disorder Father    Depression Father    Diabetes Father    Hearing loss Father    Heart disease Father    Heart disease Brother    Hyperlipidemia Brother    Hypertension Brother    Cancer Son    Breast cancer Neg Hx    Outpatient  Medications Prior to Visit  Medication Sig Dispense Refill   albuterol  (VENTOLIN  HFA) 108 (90 Base) MCG/ACT inhaler Inhale 2 puffs into the lungs every 6 (six) hours as needed for wheezing or shortness of breath. 17 each 0   aspirin 81 MG tablet Take 81 mg by mouth daily.     Cholecalciferol (VITAMIN D ) 2000 UNITS tablet Take 2,000 Units by mouth 2 (two) times daily.     DILT-XR 180 MG 24 hr capsule TAKE 1 CAPSULE BY MOUTH DAILY 90 capsule 3   ezetimibe -simvastatin  (VYTORIN ) 10-20 MG tablet TAKE 1 TABLET BY MOUTH DAILY FOR  CHOLESTEROL 90 tablet 1   fexofenadine (ALLEGRA) 180 MG tablet Take 180 mg by mouth daily.     Fluticasone  Propionate (FLONASE NA) Place into the nose as needed.     IRON PO Take 50 mg by mouth daily.     labetalol  (NORMODYNE ) 100 MG tablet Take 1 tablet (100 mg total) by mouth at bedtime. 180 tablet 2   Magnesium 400 MG CAPS Take by mouth daily.     meloxicam  (MOBIC ) 15 MG tablet Take 1/2 to 1 tablet Daily with Food  for Pain & Inflammation 90 tablet 3   Multiple Vitamins-Minerals (MULTIVITAMIN WITH MINERALS) tablet Take 1 tablet by mouth daily.     nitroGLYCERIN  (NITROSTAT ) 0.4 MG SL tablet Place 1 tablet (0.4 mg total) under the tongue every 5 (five) minutes as needed for chest pain. 25 tablet 12   pantoprazole  (PROTONIX ) 40 MG tablet TAKE 1 TABLET BY MOUTH TWICE DAILY( EVERY 12 HOURS) TO PREVENT HEARTBURN AND INDIGESTION 180 tablet 3   vitamin C (ASCORBIC ACID) 500 MG tablet Take 500 mg by mouth daily.     Zinc  50 MG TABS Take by mouth daily.     amoxicillin  (AMOXIL ) 875 MG tablet Take 1 tablet (875 mg total) by mouth 2 (two) times daily. 1 po BID 20 tablet 0   No facility-administered medications prior to visit.   Allergies  Allergen Reactions   Altace [Ramipril] Cough   Celebrex [Celecoxib]     Gi upset    Tape Rash   Azithromycin Palpitations   Hydrochlorothiazide     cramping   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   Prilosec [Omeprazole]     constipation   Tapentadol Rash   Objective:   Today's Vitals   12/29/23 1029  BP: 122/70  Pulse: 74  Temp: 97.6 F (36.4 C)  TempSrc: Temporal  SpO2: 97%  Weight: 140 lb 9.6 oz (63.8 kg)  Height: 5' 2 (1.575 m)   Body mass index is 25.72 kg/m.   General: Well developed, well nourished. No acute distress. HEENT: Normocephalic, non-traumatic. PERRL, EOMI. Conjunctiva clear. External ears normal. EAC and TMs   normal bilaterally. Nose clear without congestion or rhinorrhea. Mucous membranes  moist. Oropharynx clear.  Neck: Supple. No lymphadenopathy. No thyromegaly. Lungs: Clear to auscultation bilaterally. No wheezing, rales or rhonchi. Psych: Alert and oriented. Normal mood and affect.  Health Maintenance Due  Topic Date Due   Zoster Vaccines- Shingrix (1 of 2) 10/09/1961     Imaging: FINDINGS: Scan quality: Good.   LEFT FEMORAL NECK: BMD (in g/cm2): 0.860 T-score: -1.3 Z-score: 0.9   LEFT TOTAL HIP: BMD (in g/cm2): 0.915 T-score: -0.7 Z-score: 1.3   RIGHT FEMORAL NECK: BMD (in g/cm2): 0.757 T-score: -2.0 Z-score: 0.1   RIGHT TOTAL HIP: BMD (in g/cm2): 0.811 T-score: -1.6 Z-score: 0.4   LEFT FOREARM (RADIUS  33%): BMD (in g/cm2): 0.637 T-score: -2.7 Z-score: 0.1   Assessment & Plan:   Problem List Items Addressed This Visit       Cardiovascular and Mediastinum   CAD (coronary artery disease)   Stable. No angina. Continue aspirin 81 mg daily. I will renew her NTG.      Essential hypertension - Primary   Blood pressure is in good control. Continue diltiazem  XR 180 mg daily and labetalol  100 mg at bedtime        Musculoskeletal and Integument   Osteoporosis   Stable Dexa scan results, showing values consistent with osteoporosis at the wrist. In light of her prior osteoporotic fractures, I recommend we start ibandronate  (Boniva ) 150 mg monthly. She should continue to take calcium with Vitamin D  daily.      Relevant Medications   ibandronate  (BONIVA ) 150 MG tablet     Other   Mixed hyperlipidemia   At goal. Continue ezetimibe -simvastatin  (Vytorin ) 10-20 mg daily.      Other Visit Diagnoses       Sinobronchitis       Resolving. I recommend ongoing expectant management at this point.      Return in about 3 months (around 03/30/2024) for Annual preventative care.   Garnette CHRISTELLA Simpler, MD

## 2023-12-29 NOTE — Assessment & Plan Note (Signed)
 Stable. No angina. Continue aspirin 81 mg daily. I will renew her NTG.

## 2023-12-29 NOTE — Assessment & Plan Note (Signed)
 Blood pressure is in good control. Continue diltiazem XR 180 mg daily and labetalol 100 mg at bedtime

## 2023-12-29 NOTE — Assessment & Plan Note (Signed)
 At goal. Continue ezetimibe-simvastatin (Vytorin) 10-20 mg daily.

## 2023-12-29 NOTE — Assessment & Plan Note (Signed)
 Stable Dexa scan results, showing values consistent with osteoporosis at the wrist. In light of her prior osteoporotic fractures, I recommend we start ibandronate  (Boniva ) 150 mg monthly. She should continue to take calcium with Vitamin D  daily.

## 2024-01-02 ENCOUNTER — Encounter: Payer: Self-pay | Admitting: Radiology

## 2024-01-18 ENCOUNTER — Other Ambulatory Visit: Payer: Self-pay | Admitting: Family Medicine

## 2024-01-18 ENCOUNTER — Encounter: Payer: Self-pay | Admitting: Family Medicine

## 2024-01-18 DIAGNOSIS — Z1231 Encounter for screening mammogram for malignant neoplasm of breast: Secondary | ICD-10-CM

## 2024-01-20 ENCOUNTER — Telehealth: Admitting: Emergency Medicine

## 2024-01-20 ENCOUNTER — Ambulatory Visit: Payer: Self-pay

## 2024-01-20 DIAGNOSIS — R051 Acute cough: Secondary | ICD-10-CM | POA: Diagnosis not present

## 2024-01-20 MED ORDER — PREDNISONE 20 MG PO TABS: 40.0000 mg | ORAL_TABLET | Freq: Every day | ORAL | 0 refills | Status: AC

## 2024-01-20 MED ORDER — BENZONATATE 100 MG PO CAPS: 100.0000 mg | ORAL_CAPSULE | Freq: Two times a day (BID) | ORAL | 0 refills | Status: AC | PRN

## 2024-01-20 NOTE — Patient Instructions (Signed)
 [name], thank you for joining Lamar Schlossman, PA-C for today's virtual visit.  While this provider is not your primary care provider (PCP), if your PCP is located in our provider database this encounter information will be shared with them immediately following your visit.   A [facility] MyChart account gives you access to today's visit and all your visits, tests, and labs performed at [facility]  click here if you don't have a [facility] MyChart account or go to [email or web address]  Consent: (Patient) Jasmine Reeves provided verbal consent for this virtual visit at the beginning of the encounter.  Current Medications:  Current Outpatient Medications:    benzonatate  (TESSALON ) 100 MG capsule, Take 1 capsule (100 mg total) by mouth 2 (two) times daily as needed for cough., Disp: 20 capsule, Rfl: 0   predniSONE  (DELTASONE ) 20 MG tablet, Take 2 tablets (40 mg total) by mouth daily., Disp: 10 tablet, Rfl: 0   albuterol  (VENTOLIN  HFA) 108 (90 Base) MCG/ACT inhaler, Inhale 2 puffs into the lungs every 6 (six) hours as needed for wheezing or shortness of breath., Disp: 17 each, Rfl: 0   aspirin 81 MG tablet, Take 81 mg by mouth daily., Disp: , Rfl:    Cholecalciferol (VITAMIN D ) 2000 UNITS tablet, Take 2,000 Units by mouth 2 (two) times daily., Disp: , Rfl:    DILT-XR 180 MG 24 hr capsule, TAKE 1 CAPSULE BY MOUTH DAILY, Disp: 90 capsule, Rfl: 3   ezetimibe -simvastatin  (VYTORIN ) 10-20 MG tablet, TAKE 1 TABLET BY MOUTH DAILY FOR CHOLESTEROL, Disp: 90 tablet, Rfl: 1   fexofenadine (ALLEGRA) 180 MG tablet, Take 180 mg by mouth daily., Disp: , Rfl:    Fluticasone  Propionate (FLONASE NA), Place into the nose as needed., Disp: , Rfl:    ibandronate  (BONIVA ) 150 MG tablet, Take 1 tablet (150 mg total) by mouth every 30 (thirty) days. Take in the morning with a full glass of water, on an empty stomach, and do not take anything else by mouth or lie down for the next 30 min., Disp:  12 tablet, Rfl: 0   IRON PO, Take 50 mg by mouth daily., Disp: , Rfl:    labetalol  (NORMODYNE ) 100 MG tablet, Take 1 tablet (100 mg total) by mouth at bedtime., Disp: 180 tablet, Rfl: 2   Magnesium 400 MG CAPS, Take by mouth daily., Disp: , Rfl:    meloxicam  (MOBIC ) 15 MG tablet, Take 1/2 to 1 tablet Daily with Food  for Pain & Inflammation, Disp: 90 tablet, Rfl: 3   Multiple Vitamins-Minerals (MULTIVITAMIN WITH MINERALS) tablet, Take 1 tablet by mouth daily., Disp: , Rfl:    nitroGLYCERIN  (NITROSTAT ) 0.4 MG SL tablet, Place 1 tablet (0.4 mg total) under the tongue every 5 (five) minutes as needed for chest pain., Disp: 25 tablet, Rfl: 12   pantoprazole  (PROTONIX ) 40 MG tablet, TAKE 1 TABLET BY MOUTH TWICE DAILY( EVERY 12 HOURS) TO PREVENT HEARTBURN AND INDIGESTION, Disp: 180 tablet, Rfl: 3   vitamin C (ASCORBIC ACID) 500 MG tablet, Take 500 mg by mouth daily., Disp: , Rfl:    Zinc  50 MG TABS, Take by mouth daily., Disp: , Rfl:    Medications ordered in this encounter:  Meds ordered this encounter  Medications   predniSONE  (DELTASONE ) 20 MG tablet    Sig: Take 2 tablets (40 mg total) by mouth daily.    Dispense:  10 tablet    Refill:  0    Supervising Provider:   BLAISE ALEENE KIDD [  [id number]]   benzonatate  (TESSALON ) 100 MG capsule    Sig: Take 1 capsule (100 mg total) by mouth 2 (two) times daily as needed for cough.    Dispense:  20 capsule    Refill:  0    Supervising Provider:   BLAISE ALEENE KIDD [[id number]]     *If you need refills on other medications prior to your next appointment, please contact your pharmacy*  Follow-Up: Call back or seek an in-person evaluation if the symptoms worsen or if the condition fails to improve as anticipated.  [facility] [phone]  Other Instructions    If you have been instructed to have an in-person evaluation today at a local Urgent Care facility, please use the link below. It will take you to a list of all of our available  [facility], including address, phone number and hours of operation. Please do not delay care.  [facility]  If you or a family member do not have a primary care provider, use the link below to schedule a visit and establish care. When you choose a [facility] primary care physician or advanced practice provider, you gain a long-term partner in health. Find a Primary Care Provider  Learn more about [facility] in-office and virtual care options: [facility] - Get Care Now

## 2024-01-20 NOTE — Telephone Encounter (Signed)
 FYI Only or Action Required?: FYI only for provider: appointment scheduled on 01/20/24 Virtual UC.  Patient was last seen in primary care on 12/29/2023 by Thedora Garnette HERO, MD.  Called Nurse Triage reporting Cough.  Symptoms began a week ago.  Interventions attempted: OTC medications: Mucinex DM.  Symptoms are: gradually worsening.  Triage Disposition: See Physician Within 24 Hours  Patient/caregiver understands and will follow disposition?: Yes   Copied from CRM #[id number]. Topic: Clinical - Red Word Triage >> Jan 20, 2024  9:10 AM Mercedes MATSU wrote: Red Word that prompted transfer to Nurse Triage: Patient called in stating that she think that she may have bronchitis. She said she is experiencing a really bad sore throat, painful, and a cough, and she is overall not feeling well. Patient also stated she did [place] the last weekend and she is afraid she may have picked something up because her daughter is sick too. Reason for Disposition  [1] Continuous (nonstop) coughing interferes with work or school AND [2] no improvement using cough treatment per Care Advice  Answer Assessment - Initial Assessment Questions 1. ONSET: When did the cough begin?      X 1 week 3. SPUTUM: Describe the color of your sputum (e.g., none, dry cough; clear, white, yellow, green)     Clear  5. DIFFICULTY BREATHING: Are you having difficulty breathing? If Yes, ask: How bad is it? (e.g., mild, moderate, severe)      None 6. FEVER: Do you have a fever? If Yes, ask: What is your temperature, how was it measured, and when did it start?     None 7. CARDIAC HISTORY: Do you have any history of heart disease? (e.g., heart attack, congestive heart failure)      Bypass surgery 20 some years ago 8. LUNG HISTORY: Do you have any history of lung disease?  (e.g., pulmonary embolus, asthma, emphysema)     COPD 9. PE RISK FACTORS: Do you have a history of blood clots? (or: recent major  surgery, recent prolonged travel, bedridden)     Recent travel 10. OTHER SYMPTOMS: Do you have any other symptoms? (e.g., runny nose, wheezing, chest pain)       Nasal congestion, runny nose 12. TRAVEL: Have you traveled out of the country in the last month? (e.g., travel history, exposures)       Recently returned from a trip to [place]  Protocols used: Cough - Acute Non-Productive-A-AH

## 2024-01-20 NOTE — Progress Notes (Signed)
 Virtual Visit Consent   Jasmine Reeves, you are scheduled for a virtual visit with a [facility] provider today. Just as with appointments in the office, your consent must be obtained to participate. Your consent will be active for this visit and any virtual visit you may have with one of our providers in the next 365 days. If you have a MyChart account, a copy of this consent can be sent to you electronically.  As this is a virtual visit, video technology does not allow for your provider to perform a traditional examination. This may limit your provider's ability to fully assess your condition. If your provider identifies any concerns that need to be evaluated in person or the need to arrange testing (such as labs, EKG, etc.), we will make arrangements to do so. Although advances in technology are sophisticated, we cannot ensure that it will always work on either your end or our end. If the connection with a video visit is poor, the visit may have to be switched to a telephone visit. With either a video or telephone visit, we are not always able to ensure that we have a secure connection.  By engaging in this virtual visit, you consent to the provision of healthcare and authorize for your insurance to be billed (if applicable) for the services provided during this visit. Depending on your insurance coverage, you may receive a charge related to this service.  I need to obtain your verbal consent now. Are you willing to proceed with your visit today? Jasmine Reeves has provided verbal consent on 01/20/2024 for a virtual visit (video or telephone). Lamar Schlossman, PA-C  Date: 01/20/2024 10:46 AM   Virtual Visit via Video Note   I, Lamar Schlossman, connected with  Jasmine Reeves  ([id number], August 19, 1940) on 01/20/24 at 10:45 AM EST by a video-enabled telemedicine application and verified that I am speaking with the correct person using two identifiers.  Location: Patient: Virtual Visit Location  Patient: Home Provider: Virtual Visit Location Provider: Home Office   I discussed the limitations of evaluation and management by telemedicine and the availability of in person appointments. The patient expressed understanding and agreed to proceed.    History of Present Illness: Jasmine Reeves is a 83 y.o. who identifies as a female who was assigned female at birth, and is being seen today for cold symptoms.  Reports associated cough.  States that the symptoms have been worsening.  States that she has had associated chest congestion.  Denies fever or chills.  Denies smoking.  Denies having taken a COVID test. Has tried taking Dayquil without much relief.  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   Compression fracture of T12 vertebra (HCC) 02/21/2020   Scoliosis deformity of spine 02/16/2019   Cystocele with rectocele 12/13/2018   BMI 23.0-23.9, adult 11/01/2017   Osteoporosis 04/12/2017   OSA on CPAP 11/27/2014   IBS (irritable bowel syndrome) 11/11/2014   Abnormal blood sugar 10/10/2013   Mixed hyperlipidemia 10/08/2013   CAD (coronary artery disease) 02/03/2013   MVP (mitral valve prolapse) 02/03/2013   Vitamin D  deficiency  GERD (gastroesophageal reflux disease)    COPD (chronic obstructive pulmonary disease) (HCC) 02/11/2010   Essential hypertension 01/28/2010   Raynauds syndrome 01/28/2010    Allergies:  Allergies  Allergen Reactions   Altace [Ramipril] Cough   Celebrex [Celecoxib]     Gi upset    Tape Rash   Azithromycin Palpitations   Hydrochlorothiazide     cramping    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   Prilosec [Omeprazole]     constipation   Tapentadol Rash   Medications:  Current Outpatient Medications:    albuterol  (VENTOLIN  HFA) 108 (90 Base) MCG/ACT inhaler, Inhale 2 puffs into the lungs every 6 (six) hours as needed for wheezing or shortness of breath., Disp: 17 each, Rfl: 0   aspirin 81 MG tablet, Take 81 mg by mouth daily., Disp: , Rfl:    Cholecalciferol (VITAMIN D ) 2000 UNITS tablet, Take 2,000 Units by mouth 2 (two) times daily., Disp: , Rfl:    DILT-XR 180 MG 24 hr capsule, TAKE 1 CAPSULE BY MOUTH DAILY, Disp: 90 capsule, Rfl: 3   ezetimibe -simvastatin  (VYTORIN ) 10-20 MG tablet, TAKE 1 TABLET BY MOUTH DAILY FOR CHOLESTEROL, Disp: 90 tablet, Rfl: 1   fexofenadine (ALLEGRA) 180 MG tablet, Take 180 mg by mouth daily., Disp: , Rfl:    Fluticasone  Propionate (FLONASE NA), Place into the nose as needed., Disp: , Rfl:    ibandronate  (BONIVA ) 150 MG tablet, Take 1 tablet (150 mg total) by mouth every 30 (thirty) days. Take in the morning with a full glass of water, on an empty stomach, and do not take anything else by mouth or lie down for the next 30 min., Disp: 12 tablet, Rfl: 0   IRON PO, Take 50 mg by mouth daily., Disp: , Rfl:    labetalol  (NORMODYNE ) 100 MG tablet, Take 1 tablet (100 mg total) by mouth at bedtime., Disp: 180 tablet, Rfl: 2   Magnesium 400 MG CAPS, Take by mouth daily., Disp: , Rfl:    meloxicam  (MOBIC ) 15 MG tablet, Take 1/2 to 1 tablet Daily with Food  for Pain & Inflammation, Disp: 90 tablet, Rfl: 3   Multiple Vitamins-Minerals (MULTIVITAMIN WITH MINERALS) tablet, Take 1 tablet by mouth daily., Disp: , Rfl:    nitroGLYCERIN  (NITROSTAT ) 0.4 MG SL tablet, Place 1 tablet (0.4 mg total) under the tongue every 5 (five) minutes as needed for chest pain., Disp: 25 tablet, Rfl: 12   pantoprazole  (PROTONIX ) 40 MG tablet, TAKE 1 TABLET BY MOUTH TWICE DAILY( EVERY 12 HOURS) TO PREVENT HEARTBURN AND  INDIGESTION, Disp: 180 tablet, Rfl: 3   vitamin C (ASCORBIC ACID) 500 MG tablet, Take 500 mg by mouth daily., Disp: , Rfl:    Zinc  50 MG TABS, Take by mouth daily., Disp: , Rfl:   Observations/Objective: Patient is well-developed, well-nourished in no acute distress.  Resting comfortably at home.  Head is normocephalic, atraumatic.  No labored breathing.  Speech is clear and coherent with logical content.  Patient is alert and oriented at baseline.    Assessment and Plan: There are no diagnoses linked to this encounter. Meds ordered this encounter  Medications   predniSONE  (DELTASONE ) 20 MG tablet    Sig: Take 2 tablets (40 mg total) by mouth daily.    Dispense:  10 tablet    Refill:  0    Supervising Provider:   LAMPTEY, PHILIP O [[id number]]   benzonatate  (TESSALON ) 100 MG capsule    Sig: Take 1  capsule (100 mg total) by mouth 2 (two) times daily as needed for cough.    Dispense:  20 capsule    Refill:  0    Supervising Provider:   BLAISE ALEENE KIDD [[id number]]     Follow Up Instructions: I discussed the assessment and treatment plan with the patient. The patient was provided an opportunity to ask questions and all were answered. The patient agreed with the plan and demonstrated an understanding of the instructions.  A copy of instructions were sent to the patient via MyChart unless otherwise noted below.     The patient was advised to call back or seek an in-person evaluation if the symptoms worsen or if the condition fails to improve as anticipated.    Lamar Schlossman, PA-C

## 2024-01-31 ENCOUNTER — Other Ambulatory Visit

## 2024-02-01 ENCOUNTER — Encounter: Payer: Medicare PPO | Admitting: Nurse Practitioner

## 2024-02-20 ENCOUNTER — Inpatient Hospital Stay: Admission: RE | Admit: 2024-02-20 | Discharge: 2024-02-20 | Attending: Family Medicine

## 2024-02-20 DIAGNOSIS — Z1231 Encounter for screening mammogram for malignant neoplasm of breast: Secondary | ICD-10-CM

## 2024-03-05 ENCOUNTER — Ambulatory Visit (HOSPITAL_COMMUNITY)
Admission: RE | Admit: 2024-03-05 | Discharge: 2024-03-05 | Disposition: A | Discharge status: Home / Self Care | Source: Ambulatory Visit

## 2024-03-05 DIAGNOSIS — I350 Nonrheumatic aortic (valve) stenosis: Secondary | ICD-10-CM | POA: Insufficient documentation

## 2024-03-05 LAB — ECHOCARDIOGRAM COMPLETE
AR max vel: 1.4 cm2
AV Area VTI: 1.33 cm2
AV Area mean vel: 1.38 cm2
AV Mean grad: 9 mmHg
AV Peak grad: 16.7 mmHg
Ao pk vel: 2.04 m/s
Area-P 1/2: 2.8 cm2
MV M vel: 5.09 m/s
MV Peak grad: 103.6 mmHg
S' Lateral: 2.4 cm

## 2024-03-06 ENCOUNTER — Ambulatory Visit: Payer: Self-pay | Admitting: Cardiology

## 2024-03-06 DIAGNOSIS — I350 Nonrheumatic aortic (valve) stenosis: Secondary | ICD-10-CM

## 2024-03-06 DIAGNOSIS — I341 Nonrheumatic mitral (valve) prolapse: Secondary | ICD-10-CM

## 2024-04-02 ENCOUNTER — Encounter: Admitting: Family Medicine

## 2024-05-02 ENCOUNTER — Encounter: Admitting: Family Medicine

## 2024-07-04 ENCOUNTER — Ambulatory Visit: Admitting: Cardiovascular Disease

## 2024-12-07 ENCOUNTER — Ambulatory Visit
